# Patient Record
Sex: Male | Born: 1970 | Race: Black or African American | Hispanic: No | Marital: Married | Smoking: Never smoker
Health system: Southern US, Community
[De-identification: ages and names within clinical notes are randomized; demographics above are authoritative.]

## PROBLEM LIST (undated history)

## (undated) DIAGNOSIS — Z8489 Family history of other specified conditions: Secondary | ICD-10-CM

## (undated) DIAGNOSIS — IMO0002 Reserved for concepts with insufficient information to code with codable children: Secondary | ICD-10-CM

## (undated) DIAGNOSIS — I5042 Chronic combined systolic (congestive) and diastolic (congestive) heart failure: Secondary | ICD-10-CM

## (undated) DIAGNOSIS — I10 Essential (primary) hypertension: Secondary | ICD-10-CM

## (undated) DIAGNOSIS — I428 Other cardiomyopathies: Secondary | ICD-10-CM

## (undated) DIAGNOSIS — I213 ST elevation (STEMI) myocardial infarction of unspecified site: Secondary | ICD-10-CM

## (undated) DIAGNOSIS — Z95810 Presence of automatic (implantable) cardiac defibrillator: Secondary | ICD-10-CM

## (undated) DIAGNOSIS — K219 Gastro-esophageal reflux disease without esophagitis: Secondary | ICD-10-CM

## (undated) DIAGNOSIS — D62 Acute posthemorrhagic anemia: Secondary | ICD-10-CM

## (undated) DIAGNOSIS — E119 Type 2 diabetes mellitus without complications: Secondary | ICD-10-CM

## (undated) HISTORY — DX: Chronic combined systolic (congestive) and diastolic (congestive) heart failure: I50.42

## (undated) HISTORY — DX: Other cardiomyopathies: I42.8

## (undated) HISTORY — DX: Essential (primary) hypertension: I10

## (undated) HISTORY — DX: ST elevation (STEMI) myocardial infarction of unspecified site: I21.3

## (undated) HISTORY — DX: Type 2 diabetes mellitus without complications: E11.9

## (undated) HISTORY — DX: Reserved for concepts with insufficient information to code with codable children: IMO0002

## (undated) HISTORY — PX: GASTRIC BYPASS: SHX52

---

## 2000-12-15 ENCOUNTER — Emergency Department (HOSPITAL_COMMUNITY): Admission: EM | Admit: 2000-12-15 | Discharge: 2000-12-15 | Payer: Self-pay | Admitting: Emergency Medicine

## 2007-03-01 ENCOUNTER — Emergency Department: Payer: Self-pay | Admitting: Emergency Medicine

## 2007-04-14 ENCOUNTER — Ambulatory Visit: Payer: Self-pay

## 2007-05-05 ENCOUNTER — Encounter: Payer: Self-pay | Admitting: Family Medicine

## 2007-05-21 ENCOUNTER — Ambulatory Visit: Payer: Self-pay | Admitting: Pain Medicine

## 2007-06-04 ENCOUNTER — Ambulatory Visit: Payer: Self-pay | Admitting: Family Medicine

## 2007-06-04 DIAGNOSIS — E119 Type 2 diabetes mellitus without complications: Secondary | ICD-10-CM | POA: Insufficient documentation

## 2007-06-04 DIAGNOSIS — Z794 Long term (current) use of insulin: Secondary | ICD-10-CM

## 2007-06-12 ENCOUNTER — Ambulatory Visit: Payer: Self-pay | Admitting: Family Medicine

## 2007-06-12 DIAGNOSIS — E7800 Pure hypercholesterolemia, unspecified: Secondary | ICD-10-CM

## 2007-06-12 DIAGNOSIS — E1169 Type 2 diabetes mellitus with other specified complication: Secondary | ICD-10-CM | POA: Insufficient documentation

## 2007-06-12 DIAGNOSIS — E785 Hyperlipidemia, unspecified: Secondary | ICD-10-CM | POA: Insufficient documentation

## 2007-06-13 LAB — CONVERTED CEMR LAB: C-Peptide: 1.17 ng/mL (ref 0.80–3.90)

## 2007-06-16 ENCOUNTER — Encounter: Payer: Self-pay | Admitting: Family Medicine

## 2007-06-17 LAB — CONVERTED CEMR LAB
ALT: 27 units/L (ref 0–53)
AST: 19 units/L (ref 0–37)
Albumin: 3.6 g/dL (ref 3.5–5.2)
Alkaline Phosphatase: 60 units/L (ref 39–117)
BUN: 17 mg/dL (ref 6–23)
Bilirubin, Direct: 0.1 mg/dL (ref 0.0–0.3)
CO2: 34 meq/L — ABNORMAL HIGH (ref 19–32)
Calcium: 9.3 mg/dL (ref 8.4–10.5)
Chloride: 104 meq/L (ref 96–112)
Cholesterol: 135 mg/dL (ref 0–200)
Creatinine, Ser: 0.9 mg/dL (ref 0.4–1.5)
Creatinine,U: 119.3 mg/dL
GFR calc Af Amer: 121 mL/min
GFR calc non Af Amer: 100 mL/min
Glucose, Bld: 228 mg/dL — ABNORMAL HIGH (ref 70–99)
HDL: 39.4 mg/dL (ref >39.0)
Hgb A1c MFr Bld: 7.8 % — ABNORMAL HIGH (ref 4.6–6.0)
LDL Cholesterol: 75 mg/dL (ref 0–99)
Microalb Creat Ratio: 95.6 mg/g — ABNORMAL HIGH (ref 0.0–30.0)
Microalb, Ur: 11.4 mg/dL — ABNORMAL HIGH (ref 0.0–1.9)
Potassium: 4.6 meq/L (ref 3.5–5.1)
Sodium: 142 meq/L (ref 135–145)
Total Bilirubin: 0.7 mg/dL (ref 0.3–1.2)
Total CHOL/HDL Ratio: 3.4
Total Protein: 7.6 g/dL (ref 6.0–8.3)
Triglycerides: 102 mg/dL (ref 0–149)
VLDL: 20 mg/dL (ref 0–40)

## 2007-06-20 ENCOUNTER — Ambulatory Visit: Payer: Self-pay | Admitting: Family Medicine

## 2007-06-23 ENCOUNTER — Encounter: Payer: Self-pay | Admitting: Family Medicine

## 2007-06-25 ENCOUNTER — Ambulatory Visit: Payer: Self-pay | Admitting: Family Medicine

## 2007-06-25 ENCOUNTER — Telehealth (INDEPENDENT_AMBULATORY_CARE_PROVIDER_SITE_OTHER): Payer: Self-pay | Admitting: *Deleted

## 2007-07-01 ENCOUNTER — Encounter: Payer: Self-pay | Admitting: Family Medicine

## 2007-07-02 ENCOUNTER — Ambulatory Visit: Payer: Self-pay | Admitting: Family Medicine

## 2007-07-03 ENCOUNTER — Ambulatory Visit: Payer: Self-pay | Admitting: Pain Medicine

## 2007-07-09 ENCOUNTER — Ambulatory Visit: Payer: Self-pay | Admitting: Pain Medicine

## 2007-07-09 ENCOUNTER — Ambulatory Visit: Payer: Self-pay | Admitting: Family Medicine

## 2007-07-09 DIAGNOSIS — I152 Hypertension secondary to endocrine disorders: Secondary | ICD-10-CM | POA: Insufficient documentation

## 2007-07-09 DIAGNOSIS — I10 Essential (primary) hypertension: Secondary | ICD-10-CM | POA: Insufficient documentation

## 2007-07-09 DIAGNOSIS — E1159 Type 2 diabetes mellitus with other circulatory complications: Secondary | ICD-10-CM | POA: Insufficient documentation

## 2007-07-18 ENCOUNTER — Ambulatory Visit: Payer: Self-pay | Admitting: Family Medicine

## 2007-07-21 ENCOUNTER — Encounter: Payer: Self-pay | Admitting: Family Medicine

## 2007-07-21 LAB — CONVERTED CEMR LAB: Creatinine, Ser: 1 mg/dL (ref 0.40–1.50)

## 2007-07-26 ENCOUNTER — Ambulatory Visit: Payer: Self-pay | Admitting: Family Medicine

## 2007-08-20 ENCOUNTER — Encounter: Payer: Self-pay | Admitting: Family Medicine

## 2007-08-27 ENCOUNTER — Telehealth: Payer: Self-pay | Admitting: Family Medicine

## 2007-09-04 ENCOUNTER — Encounter (INDEPENDENT_AMBULATORY_CARE_PROVIDER_SITE_OTHER): Payer: Self-pay | Admitting: *Deleted

## 2007-10-01 ENCOUNTER — Ambulatory Visit: Payer: Self-pay | Admitting: Family Medicine

## 2007-10-03 LAB — CONVERTED CEMR LAB: Hgb A1c MFr Bld: 8.9 % — ABNORMAL HIGH (ref 4.6–6.0)

## 2008-01-05 ENCOUNTER — Encounter (INDEPENDENT_AMBULATORY_CARE_PROVIDER_SITE_OTHER): Payer: Self-pay | Admitting: *Deleted

## 2010-07-25 ENCOUNTER — Ambulatory Visit: Payer: Self-pay | Admitting: Internal Medicine

## 2010-07-25 DIAGNOSIS — M722 Plantar fascial fibromatosis: Secondary | ICD-10-CM | POA: Insufficient documentation

## 2010-09-24 HISTORY — PX: FINGER SURGERY: SHX640

## 2010-09-29 ENCOUNTER — Encounter: Payer: Self-pay | Admitting: Family Medicine

## 2010-09-29 ENCOUNTER — Ambulatory Visit
Admission: RE | Admit: 2010-09-29 | Discharge: 2010-09-29 | Payer: Self-pay | Source: Home / Self Care | Attending: Family Medicine | Admitting: Family Medicine

## 2010-09-29 DIAGNOSIS — K219 Gastro-esophageal reflux disease without esophagitis: Secondary | ICD-10-CM | POA: Insufficient documentation

## 2010-09-29 DIAGNOSIS — N529 Male erectile dysfunction, unspecified: Secondary | ICD-10-CM | POA: Insufficient documentation

## 2010-10-02 ENCOUNTER — Ambulatory Visit
Admission: RE | Admit: 2010-10-02 | Discharge: 2010-10-02 | Payer: Self-pay | Source: Home / Self Care | Attending: Family Medicine | Admitting: Family Medicine

## 2010-10-02 ENCOUNTER — Other Ambulatory Visit: Payer: Self-pay | Admitting: Family Medicine

## 2010-10-02 LAB — BASIC METABOLIC PANEL
BUN: 18 mg/dL (ref 6–23)
CO2: 32 mEq/L (ref 19–32)
Calcium: 9.2 mg/dL (ref 8.4–10.5)
Chloride: 102 mEq/L (ref 96–112)
Creatinine, Ser: 0.9 mg/dL (ref 0.4–1.5)
GFR: 117.86 mL/min (ref >60.00)
Glucose, Bld: 222 mg/dL — ABNORMAL HIGH (ref 70–99)
Potassium: 4.7 mEq/L (ref 3.5–5.1)
Sodium: 139 mEq/L (ref 135–145)

## 2010-10-02 LAB — LIPID PANEL
Cholesterol: 103 mg/dL (ref 0–200)
HDL: 44.5 mg/dL (ref >39.00)
LDL Cholesterol: 52 mg/dL (ref 0–99)
Total CHOL/HDL Ratio: 2
Triglycerides: 34 mg/dL (ref 0.0–149.0)
VLDL: 6.8 mg/dL (ref 0.0–40.0)

## 2010-10-02 LAB — HEPATIC FUNCTION PANEL
ALT: 15 U/L (ref 0–53)
AST: 18 U/L (ref 0–37)
Albumin: 3.5 g/dL (ref 3.5–5.2)
Alkaline Phosphatase: 62 U/L (ref 39–117)
Bilirubin, Direct: 0.1 mg/dL (ref 0.0–0.3)
Total Bilirubin: 1 mg/dL (ref 0.3–1.2)
Total Protein: 7.8 g/dL (ref 6.0–8.3)

## 2010-10-02 LAB — HEMOGLOBIN A1C: Hgb A1c MFr Bld: 9.3 % — ABNORMAL HIGH (ref 4.6–6.5)

## 2010-10-15 ENCOUNTER — Encounter: Payer: Self-pay | Admitting: Orthopaedic Surgery

## 2010-10-20 ENCOUNTER — Ambulatory Visit: Admit: 2010-10-20 | Payer: Self-pay | Admitting: Family Medicine

## 2010-10-23 ENCOUNTER — Ambulatory Visit: Admission: RE | Admit: 2010-10-23 | Payer: Self-pay | Source: Home / Self Care | Admitting: Family Medicine

## 2010-10-26 NOTE — Assessment & Plan Note (Signed)
Summary: ROA FOR 1-2 MONTH FOLLOW-UP/JRR   Vital Signs:  Patient profile:   40 year old male Height:      71.25 inches Weight:      288.50 pounds BMI:     40.10 Temp:     98.6 degrees F oral Pulse rate:   80 / minute Pulse rhythm:   regular BP sitting:   140 / 94  (left arm) Cuff size:   large  Vitals Entered By: Benny Lennert CMA Duncan Dull) (September 29, 2010 3:05 PM)  History of Present Illness: Chief complaint 1-2 month follow up DM   I have not seen this gentleman with HTN, High cholesterol and DM since 2009.Getting more exercsie at work. Trying to eat healthier.   Has lost 16 lbs since 2009.    DM, unclear control.. likely poor Has a meter but does not have strips to check blood sugar. Financial constraints.   Using insulin Humulin 70/30 from [facility] without a prescription.  Using 45 Units two times a day    HTN, poor control: Has been off lisinopril for quite sometime.  Having issues with ED.   High cholesterol: unclear control  Plantar fasciitis: Treated with NSAIDs, stretching, help pad...Marland Kitchenper pt   Dental decay 2 teeth..working on appt wth dentist... two back molars need to be taken out.  Hypertension History:      poor control. Marland Kitchen        Positive major cardiovascular risk factors include diabetes, hyperlipidemia, and hypertension.  Negative major cardiovascular risk factors include male age less than 20 years old and non-tobacco-user status.      Problems Prior to Update: 1)  Erectile Dysfunction, Organic  (ICD-607.84) 2)  Gerd  (ICD-530.81) 3)  Chest Pain, Atypical  (ICD-786.59) 4)  Plantar Fasciitis, Right  (ICD-728.71) 5)  Hypertension  (ICD-401.9) 6)  Hypercholesterolemia, Pure  (ICD-272.0) 7)  Diabetes Mellitus, Type I  (ICD-250.01) 8)  Diabetes Mellitus, Type II  (ICD-250.00)  Current Medications (verified): 1)  Humulin 70/30 70-30 %  Susp (Insulin Isophane & Regular) .... Nph/reg 45 Units in 7 Am and 45 Units At 7pm 2)  One Touch Ultra Mini Test  Strips .... Use To Test Daily To  Tid  As Directed  Dx 250.01 3)  One Touch Ultra Mini Lancets .... Use To Test Qid As Directed 4)  Accusure Insulin Syringe 31g X 5/16" 1 Ml  Misc (Insulin Syringe-Needle U-100) .... As Directed 5)  Lisinopril-Hydrochlorothiazide 20-12.5 Mg Tabs (Lisinopril-Hydrochlorothiazide) .Marland Kitchen.. 1 Tab By Mouth Daily  Allergies (verified): No Known Drug Allergies  Review of Systems General:  Denies fatigue and fever. CV:  Complains of chest pain or discomfort; non exertional chest pain, intermittant iover 6 months, no associated symptoms. Lasts 20 min at a time.. usually when lying down at night. Occuring once a week.Marland Kitchen Resp:  Denies shortness of breath. GI:  Complains of indigestion; denies abdominal pain, bloody stools, constipation, and diarrhea. GU:  Denies dysuria.  Physical Exam  General:  morbidly obese Ears:  External ear exam shows no significant lesions or deformities.  Otoscopic examination reveals clear canals, tympanic membranes are intact bilaterally without bulging, retraction, inflammation or discharge. Hearing is grossly normal bilaterally. Nose:  External nasal examination shows no deformity or inflammation. Nasal mucosa are pink and moist without lesions or exudates. Mouth:  Oral mucosa and oropharynx without lesions or exudates.  Teeth in good repair. Neck:  no carotid bruit or thyromegaly no cervical or supraclavicular lymphadenopathy  Lungs:  Normal respiratory effort,  chest expands symmetrically. Lungs are clear to auscultation, no crackles or wheezes. Heart:  Normal rate and regular rhythm. S1 and S2 normal without gallop, murmur, click, rub or other extra sounds. Abdomen:  Bowel sounds positive,abdomen soft and non-tender without masses, organomegaly or hernias noted. Pulses:  2+ rad /DP/PT pulses Extremities:  no pedal edema.    Diabetes Management Exam:    Foot Exam (with socks and/or shoes not present):       Sensory-Pinprick/Light  touch:          Left medial foot (L-4): normal          Left dorsal foot (L-5): normal          Left lateral foot (S-1): normal          Right medial foot (L-4): normal          Right dorsal foot (L-5): normal          Right lateral foot (S-1): normal       Sensory-Monofilament:          Left foot: normal          Right foot: normal       Inspection:          Left foot: normal          Right foot: normal       Nails:          Left foot: normal          Right foot: normal   Impression & Recommendations:  Problem # 1:  CHEST PAIN, ATYPICAL (ICD-786.59) High risk for cardiac issues. Orders: EKG w/ Interpretation (93000): NML sinus , no ST changes, no Q waves     Most likely due to GERD. Recommend weight loss, diet changes. Consider prilosec if not improving.   Problem # 2:  HYPERTENSION (ICD-401.9) Poor control. Encouraged exercise, weight loss, healthy eating habits.  Restart medicaiton.  His updated medication list for this problem includes:    Lisinopril-hydrochlorothiazide 20-12.5 Mg Tabs (Lisinopril-hydrochlorothiazide) .Marland Kitchen... 1 tab by mouth daily  Problem # 3:  DIABETES MELLITUS, TYPE II (ICD-250.00) Continue lisinopril. Start check ing fasting blood sugar daily. Check A1C.  His updated medication list for this problem includes:    Humulin 70/30 70-30 % Susp (Insulin isophane & regular) ..... Nph/reg 45 units in 7 am and 45 units at 7pm    Lisinopril-hydrochlorothiazide 20-12.5 Mg Tabs (Lisinopril-hydrochlorothiazide) .Marland Kitchen... 1 tab by mouth daily  Problem # 4:  HYPERCHOLESTEROLEMIA, PURE (ICD-272.0) Due for reeval.   Complete Medication List: 1)  Humulin 70/30 70-30 % Susp (Insulin isophane & regular) .... Nph/reg 45 units in 7 am and 45 units at 7pm 2)  One Touch Ultra Mini Test Strips  .... Use to test daily to  tid  as directed  dx 250.01 3)  One Touch Ultra Mini Lancets  .... Use to test qid as directed 4)  Accusure Insulin Syringe 31g X 5/16" 1 Ml Misc (Insulin  syringe-needle u-100) .... As directed 5)  Lisinopril-hydrochlorothiazide 20-12.5 Mg Tabs (Lisinopril-hydrochlorothiazide) .Marland Kitchen.. 1 tab by mouth daily  Hypertension Assessment/Plan:      The patient's hypertensive risk group is category C: Target organ damage and/or diabetes.  His calculated 10 year risk of coronary heart disease is 7 %.  Today's blood pressure is 140/94.  His blood pressure goal is < 130/80.  Patient Instructions: 1)  Check fasting blood sugar each morning.. Right down measurements. 2)  Start lisinopril back.  3)  Fasting A1C, lipids, CMET Dx 250.00.. call the day before to schedule ASAP 4)  Schedule CPX in  3 months.  5)  Prior to CPX return for A1C Dx 250.00 6)  See your eye doctor yearly to check for diabetic eye damage. Prescriptions: HUMULIN 70/30 70-30 %  SUSP (INSULIN ISOPHANE & REGULAR) NPH/REG 45 units in 7 AM and 45 units at 7pm  #3 bottles x 2   Entered and Authorized by:   Kerby Nora MD   Signed by:   Kerby Nora MD on 09/29/2010   Method used:   Electronically to        [facility] [address] (retail)       [address]       [place], [place]  [address]       Ph: [phone]       Fax: [phone]   RxID:   [id number] LISINOPRIL-HYDROCHLOROTHIAZIDE 20-12.5 MG TABS (LISINOPRIL-HYDROCHLOROTHIAZIDE) 1 tab by mouth daily  #30 x 11   Entered and Authorized by:   Kerby Nora MD   Signed by:   Kerby Nora MD on 09/29/2010   Method used:   Electronically to        [facility] [address] (retail)       [address]       [place], [place]  [address]       Ph: [phone]       Fax: [phone]   RxID:   [id number] ONE TOUCH ULTRA MINI TEST STRIPS use to test daily to  TID  as directed  Dx 250.01  #1 box x 3   Entered and Authorized by:   Kerby Nora MD   Signed by:   Kerby Nora MD on 09/29/2010   Method used:   Print then Give to Patient   RxID:    [id number]    Orders Added: 1)  EKG w/ Interpretation [93000] 2)  Est. Patient Level IV [96222]    Current Allergies (reviewed today): No known allergies   Prevention & Chronic Care Immunizations   Influenza vaccine: Not documented    Tetanus booster: Not documented    Pneumococcal vaccine: Not documented  Other Screening   Smoking status: never  (06/04/2007)  Diabetes Mellitus   HgbA1C: 8.9  (10/01/2007)    Eye exam: Not documented    Foot exam: yes  (09/29/2010)   High risk foot: Not documented   Foot care education: Not documented    Urine microalbumin/creatinine ratio: 95.6  (06/12/2007)  Lipids   Total Cholesterol: 135  (06/12/2007)   LDL: 75  (06/12/2007)   LDL Direct: Not documented   HDL: 39.4  (06/12/2007)   Triglycerides: 102  (06/12/2007)    SGOT (AST): 19  (06/12/2007)   SGPT (ALT): 27  (06/12/2007)   Alkaline phosphatase: 60  (06/12/2007)   Total bilirubin: 0.7  (06/12/2007)  Hypertension   Last Blood Pressure: 140 / 94  (09/29/2010)   Serum creatinine: 1.00  (07/18/2007)   Serum potassium 4.6  (06/12/2007)  Self-Management Support :    Diabetes self-management support: Not documented    Hypertension self-management support: Not documented    Lipid self-management support: Not documented

## 2010-10-26 NOTE — Assessment & Plan Note (Signed)
Summary: FOOT PAIN,DIABETIC/CLE   Vital Signs:  Patient profile:   40 year old male Height:      71.25 inches Weight:      286.25 pounds BMI:     39.79 Temp:     98.7 degrees F oral Pulse rate:   80 / minute Pulse rhythm:   regular BP sitting:   124 / 100  (left arm) Cuff size:   large  Vitals Entered By: Selena Batten Dance CMA (AAMA) (July 25, 2010 4:21 PM) CC: Right foot pain   History of Present Illness: CC: R foot pain  3 wk h/o heel of R foot painful, nagging, tingling.  when puts pressure on heel hurts.  Started since injury  at work  ~3wks ago, in [place] stepping off trailer to set vault, stepped off trailer rise and fell on heel.  No pain then.  Next day significant pain at heel.  No bruising.  Painful ever since.  Using pain rub (topical NSAID?) on which helps.  Much better now.  Worst pain is first step in morning.  Then slowly improves throughout day.  Certain shoes (with poor arch support) make pain worse.    Diabetic.    Current Medications (verified): 1)  Humulin 70/30 70-30 %  Susp (Insulin Isophane & Regular) .... Nph/reg 60 Units in 7 Am and 42 Units At 7pm 2)  One Touch Ultra Mini Test Strips .... Use To Test Qid As Directed 3)  One Touch Ultra Mini Lancets .... Use To Test Qid As Directed 4)  Accusure Insulin Syringe 31g X 5/16" 1 Ml  Misc (Insulin Syringe-Needle U-100) .... As Directed  Allergies (verified): No Known Drug Allergies  Past History:  Past Medical History: Diabetes mellitus, type I (40yo at dx) Hypertension  Social History: Occupation: makes Airline pilot, Location manager Married x 1 years Never Smoked Alcohol use-no Drug use-no Regular exercise-yes, walking 1 mile per day Has lost 20 bs Diet: fast food, diet soda, unsweet tea  Review of Systems       per HPI  Physical Exam  General:  morbidly obese Msk:  R heel with tenderness on palpation.  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  + knees with valgus from flat  foot. Pulses:  2+ rad /DP/PT pulses Extremities:  no pedal edema.   Skin:  Intact without suspicious lesions or rashes   Impression & Recommendations:  Problem # 1:  PLANTAR FASCIITIS, RIGHT (ICD-728.71) history and exam consistent with plantar fasciitis.  treat conservatively with NSAIDs and ice/rest and heel cushion.  RTC if not better with these measures.  Complete Medication List: 1)  Humulin 70/30 70-30 % Susp (Insulin isophane & regular) .... Nph/reg 60 units in 7 am and 42 units at 7pm 2)  One Touch Ultra Mini Test Strips  .... Use to test qid as directed 3)  One Touch Ultra Mini Lancets  .... Use to test qid as directed 4)  Accusure Insulin Syringe 31g X 5/16" 1 Ml Misc (Insulin syringe-needle u-100) .... As directed  Patient Instructions: 1)  Make follow up appointment for diabetes with Dr. Ermalene Searing in next 1-2 months. 2)  Sounds like plantar fasciitis. 3)  Treat with anti inflammatory (advil 200mg  1-2 pills every 6-8 hours). 4)  Use heel cushion in R shoe 5)  Frozen Water bottle massage 6)  If not improving, give [place] a call. 7)  If worsening, you may need to be seen again.   Orders Added: 1)  Est. Patient Level  III [id number]    Current Allergies (reviewed today): No known allergies

## 2010-10-30 ENCOUNTER — Ambulatory Visit: Payer: Self-pay | Admitting: Family Medicine

## 2010-11-06 ENCOUNTER — Ambulatory Visit: Payer: Self-pay | Admitting: Family Medicine

## 2010-11-07 ENCOUNTER — Encounter: Payer: Self-pay | Admitting: Family Medicine

## 2010-11-07 ENCOUNTER — Ambulatory Visit (INDEPENDENT_AMBULATORY_CARE_PROVIDER_SITE_OTHER): Payer: 59 | Admitting: Family Medicine

## 2010-11-07 DIAGNOSIS — I10 Essential (primary) hypertension: Secondary | ICD-10-CM

## 2010-11-07 DIAGNOSIS — E7800 Pure hypercholesterolemia, unspecified: Secondary | ICD-10-CM

## 2010-11-07 DIAGNOSIS — E119 Type 2 diabetes mellitus without complications: Secondary | ICD-10-CM

## 2010-11-15 NOTE — Assessment & Plan Note (Signed)
Summary: follow up   Vital Signs:  Patient profile:   40 year old male Height:      71.25 inches Weight:      282.50 pounds BMI:     39.27 Temp:     98.9 degrees F oral Pulse rate:   80 / minute Pulse rhythm:   regular BP sitting:   160 / 90  (left arm) Cuff size:   large  Vitals Entered By: Benny Lennert CMA Duncan Dull) (November 07, 2010 4:16 PM)  History of Present Illness: Chief complaint follow up   High Cholesterol.. well controlled on recent labs. On no meidcaiton.   DM, poor control on 70/30 45 UNits two times a day  FBS per pt 198-349.. usually 260s  6 lb weight loss per pt  Diet: Fair control.. trying to decrease bread.  Yesterday ate at [employer] (2 buiscuit s and egg) and [employer]  Exercise: at work taking metformin wth no SE.   HTN, poor control desptite being back on lisinopril/HCTZ.   Does not have cuff at home.   Problems Prior to Update: 1)  Erectile Dysfunction, Organic  (ICD-607.84) 2)  Gerd  (ICD-530.81) 3)  Chest Pain, Atypical  (ICD-786.59) 4)  Plantar Fasciitis, Right  (ICD-728.71) 5)  Hypertension  (ICD-401.9) 6)  Hypercholesterolemia, Pure  (ICD-272.0) 7)  Diabetes Mellitus, Type I  (ICD-250.01) 8)  Diabetes Mellitus, Type II  (ICD-250.00)  Current Medications (verified): 1)  Humulin 70/30 70-30 %  Susp (Insulin Isophane & Regular) .... Nph/reg 45 Units in 7 Am and 45 Units At 7pm 2)  One Touch Ultra Mini Test Strips .... Use To Test Daily To  Tid  As Directed  Dx 250.01 3)  One Touch Ultra Mini Lancets .... Use To Test Qid As Directed 4)  Accusure Insulin Syringe 31g X 5/16" 1 Ml  Misc (Insulin Syringe-Needle U-100) .... As Directed 5)  Lisinopril-Hydrochlorothiazide 20-12.5 Mg Tabs (Lisinopril-Hydrochlorothiazide) .Marland Kitchen.. 1 Tab By Mouth Daily 6)  Metformin Hcl 500 Mg Xr24h-Tab (Metformin Hcl) .Marland Kitchen.. 1 Tab By Mouth Daily  Allergies (verified): No Known Drug Allergies  Past History:  Past medical, surgical, family and social histories  (including risk factors) reviewed, and no changes noted (except as noted below).  Past Medical History: Reviewed history from 07/25/2010 and no changes required. Diabetes mellitus, type I (40yo at dx) Hypertension  Past Surgical History: Reviewed history from 06/04/2007 and no changes required. slipped discs L4L5 steroid epidural injections Denies surgical history  Family History: Reviewed history from 06/04/2007 and no changes required. father died age 48 DM, HTN, colon cancer mother age 51 DM, HTN, breast cancer 3 brothers healthy MGF prostate cancer, CVA no MI , age 6  Social History: Reviewed history from 07/25/2010 and no changes required. Occupation: makes Airline pilot, Location manager Married x 1 years Never Smoked Alcohol use-no Drug use-no Regular exercise-yes, walking 1 mile per day Has lost 20 bs Diet: fast food, diet soda, unsweet tea  Physical Exam  General:  overweight male in NAD Ears:  External ear exam shows no significant lesions or deformities.  Otoscopic examination reveals clear canals, tympanic membranes are intact bilaterally without bulging, retraction, inflammation or discharge. Hearing is grossly normal bilaterally. Nose:  External nasal examination shows no deformity or inflammation. Nasal mucosa are pink and moist without lesions or exudates. Mouth:  MMM Neck:  no carotid bruit or thyromegaly no cervical or supraclavicular lymphadenopathy  Lungs:  Normal respiratory effort, chest expands symmetrically. Lungs are clear to auscultation, no  crackles or wheezes. Heart:  Normal rate and regular rhythm. S1 and S2 normal without gallop, murmur, click, rub or other extra sounds. Abdomen:  Bowel sounds positive,abdomen soft and non-tender without masses, organomegaly or hernias noted. Pulses:  R and L posterior tibial pulses are full and equal bilaterally  Extremities:  no edema   Diabetes Management Exam:    Foot Exam (with socks and/or shoes  not present):       Sensory-Pinprick/Light touch:          Left medial foot (L-4): normal          Left dorsal foot (L-5): normal          Left lateral foot (S-1): normal          Right medial foot (L-4): normal          Right dorsal foot (L-5): normal          Right lateral foot (S-1): normal       Sensory-Monofilament:          Left foot: normal          Right foot: normal       Inspection:          Left foot: normal          Right foot: normal       Nails:          Left foot: normal          Right foot: normal   Impression & Recommendations:  Problem # 1:  HYPERTENSION (ICD-401.9) Inadequate control.. increase to 2 tab by mouth daily.  His updated medication list for this problem includes:    Lisinopril-hydrochlorothiazide 20-12.5 Mg Tabs (Lisinopril-hydrochlorothiazide) .Marland Kitchen... 2 tab by mouth daily  Problem # 2:  HYPERCHOLESTEROLEMIA, PURE (ICD-272.0) Well controlled . Recheck in 1 year.   Problem # 3:  DIABETES MELLITUS, TYPE II (ICD-250.00) Continue 70/30 . Increase metformin gradually to max 2000 mg daily. His updated medication list for this problem includes:    Humulin 70/30 70-30 % Susp (Insulin isophane & regular) ..... Nph/reg 45 units in 7 am and 45 units at 7pm    Lisinopril-hydrochlorothiazide 20-12.5 Mg Tabs (Lisinopril-hydrochlorothiazide) .Marland Kitchen... 2 tab by mouth daily    Metformin Hcl 1000 Mg (osm) Xr24h-tab (Metformin hcl) .Marland Kitchen... 1 tab by mouth daily... if tolerated increas to 2 tabs by mouth daily.  Complete Medication List: 1)  Humulin 70/30 70-30 % Susp (Insulin isophane & regular) .... Nph/reg 45 units in 7 am and 45 units at 7pm 2)  One Touch Ultra Mini Test Strips  .... Use to test daily to  tid  as directed  dx 250.01 3)  One Touch Ultra Mini Lancets  .... Use to test qid as directed 4)  Accusure Insulin Syringe 31g X 5/16" 1 Ml Misc (Insulin syringe-needle u-100) .... As directed 5)  Lisinopril-hydrochlorothiazide 20-12.5 Mg Tabs  (Lisinopril-hydrochlorothiazide) .... 2 tab by mouth daily 6)  Metformin Hcl 1000 Mg (osm) Xr24h-tab (Metformin hcl) .Marland Kitchen.. 1 tab by mouth daily... if tolerated increas to 2 tabs by mouth daily.  Patient Instructions: 1)  Work on decreasing carbs starches in diet... increaselean protein, veggies, and fiber. 2)   Increase exercsie as able. 3)  Increase to 2 tabs daily of metformin x 1 week... if tolertating increase to 3 tabs daily x 1 week... then  4 tabs daily. 4)  When out of 500 mg tab meds.Marland Kitchen go to pharmacy or new refill. 5)  Increase lisinopril to 2 tabs by mouth daily.  6)   Please schedule a follow-up appointment in 3 months .  7)  HgBA1c prior to visit  ICD-9: 250.00 Prescriptions: METFORMIN HCL 1000 MG (OSM) XR24H-TAB (METFORMIN HCL) 1 tab by mouth daily... if tolerated increas to 2 tabs by mouth daily.  #60 x 11   Entered and Authorized by:   Kerby Nora MD   Signed by:   Kerby Nora MD on 11/07/2010   Method used:   Electronically to        [facility] [address] (retail)       [address]       [place], [place]  [address]       Ph: [phone]       Fax: [phone]   RxID:   [id number]    Orders Added: 1)  Est. Patient Level IV [36644]    Current Allergies (reviewed today): No known allergies

## 2010-11-18 ENCOUNTER — Encounter: Payer: Self-pay | Admitting: Family Medicine

## 2010-12-15 ENCOUNTER — Other Ambulatory Visit: Payer: Self-pay | Admitting: *Deleted

## 2010-12-15 MED ORDER — METFORMIN HCL ER (MOD) 1000 MG PO TB24: ORAL_TABLET | ORAL | Status: DC

## 2010-12-15 MED ORDER — INSULIN NPH ISOPHANE & REGULAR (70-30) 100 UNIT/ML ~~LOC~~ SUSP: SUBCUTANEOUS | Status: DC

## 2010-12-15 MED ORDER — LISINOPRIL-HYDROCHLOROTHIAZIDE 20-12.5 MG PO TABS: 2.0000 | ORAL_TABLET | Freq: Every day | ORAL | Status: DC

## 2010-12-21 ENCOUNTER — Telehealth: Payer: Self-pay | Admitting: *Deleted

## 2010-12-21 NOTE — Telephone Encounter (Signed)
Pharmacy is asking for clarification on quantity on novolin 70/30.  Forms are on your desk.

## 2010-12-28 ENCOUNTER — Other Ambulatory Visit: Payer: Self-pay

## 2011-01-01 ENCOUNTER — Encounter: Payer: Self-pay | Admitting: Family Medicine

## 2011-02-05 ENCOUNTER — Other Ambulatory Visit (INDEPENDENT_AMBULATORY_CARE_PROVIDER_SITE_OTHER): Payer: 59

## 2011-02-05 DIAGNOSIS — E119 Type 2 diabetes mellitus without complications: Secondary | ICD-10-CM

## 2011-02-05 LAB — HEMOGLOBIN A1C: Hgb A1c MFr Bld: 8.8 % — ABNORMAL HIGH (ref 4.6–6.5)

## 2011-02-05 NOTE — Progress Notes (Signed)
Addended by: Melody Comas on: 02/05/2011 08:25 AM   Modules accepted: Orders

## 2011-02-09 ENCOUNTER — Encounter: Payer: Self-pay | Admitting: Family Medicine

## 2011-02-27 ENCOUNTER — Ambulatory Visit (INDEPENDENT_AMBULATORY_CARE_PROVIDER_SITE_OTHER): Payer: 59 | Admitting: Family Medicine

## 2011-02-27 ENCOUNTER — Encounter: Payer: Self-pay | Admitting: Family Medicine

## 2011-02-27 DIAGNOSIS — E7800 Pure hypercholesterolemia, unspecified: Secondary | ICD-10-CM

## 2011-02-27 DIAGNOSIS — M7580 Other shoulder lesions, unspecified shoulder: Secondary | ICD-10-CM

## 2011-02-27 DIAGNOSIS — I10 Essential (primary) hypertension: Secondary | ICD-10-CM

## 2011-02-27 DIAGNOSIS — M719 Bursopathy, unspecified: Secondary | ICD-10-CM

## 2011-02-27 DIAGNOSIS — Z23 Encounter for immunization: Secondary | ICD-10-CM

## 2011-02-27 DIAGNOSIS — Z0000 Encounter for general adult medical examination without abnormal findings: Secondary | ICD-10-CM

## 2011-02-27 DIAGNOSIS — E119 Type 2 diabetes mellitus without complications: Secondary | ICD-10-CM

## 2011-02-27 DIAGNOSIS — M67919 Unspecified disorder of synovium and tendon, unspecified shoulder: Secondary | ICD-10-CM

## 2011-02-27 MED ORDER — DICLOFENAC SODIUM 75 MG PO TBEC: 75.0000 mg | DELAYED_RELEASE_TABLET | Freq: Two times a day (BID) | ORAL | Status: DC

## 2011-02-27 NOTE — Progress Notes (Signed)
Subjective:    Patient ID: Philip Ponce, male    DOB: [birth year], 40 y.o.   MRN: [id number]  HPI The patient is here for annual wellness exam and preventative care.     Diabetes: On 50 Units 70/30 in AM and 45 in PM and on metformin. He feels better splitting dose AM and PM.  Has lost 4 lbs. Using medications without difficulties: Yes. Hypoglycemic episodes:Yes Hyperglycemic episodes:No Feet problems:None Blood Sugars averaging: not checking regualrly 170-200s eye exam within last year: none  Hypertension:  At goal < 130/80 on lisinopril HCTZ.   Using medication without problems or lightheadedness:  Chest pain with exertion: None Edema:None Short of breath:None Average home BPs:?   Elevated Cholesterol:Was well controlled at last check 3 months ago.  Right shoulder pain x months. Pain with abduction, int and ecxt rotation.  No fall.  Cannot sleep due to pain. No weakness, no numbness.    Review of Systems  Constitutional: Negative for fever, fatigue and unexpected weight change.  HENT: Negative for ear pain, congestion, sore throat, rhinorrhea, trouble swallowing and postnasal drip.   Eyes: Negative for pain.  Respiratory: Negative for cough, shortness of breath and wheezing.   Cardiovascular: Negative for chest pain, palpitations and leg swelling.  Gastrointestinal: Negative for nausea, abdominal pain, diarrhea, constipation and blood in stool.  Genitourinary: Negative for dysuria, urgency, hematuria, discharge, penile swelling, scrotal swelling, difficulty urinating, penile pain and testicular pain.  Skin: Negative for rash.  Neurological: Negative for syncope, weakness, light-headedness, numbness and headaches.  Psychiatric/Behavioral: Negative for behavioral problems and dysphoric mood. The patient is not nervous/anxious.        Objective:   Physical Exam  Constitutional: He appears well-developed and well-nourished.  Non-toxic appearance. He does not appear  ill. No distress.  HENT:  Head: Normocephalic and atraumatic.  Right Ear: Hearing, tympanic membrane, external ear and ear canal normal.  Left Ear: Hearing, tympanic membrane, external ear and ear canal normal.  Nose: Nose normal.  Mouth/Throat: Uvula is midline, oropharynx is clear and moist and mucous membranes are normal.  Eyes: Conjunctivae, EOM and lids are normal. Pupils are equal, round, and reactive to light. No foreign bodies found.  Neck: Trachea normal, normal range of motion and phonation normal. Neck supple. Carotid bruit is not present. No mass and no thyromegaly present.  Cardiovascular: Normal rate, regular rhythm, S1 normal, S2 normal, intact distal pulses and normal pulses.  Exam reveals no gallop.   No murmur heard. Pulmonary/Chest: Breath sounds normal. He has no wheezes. He has no rhonchi. He has no rales.  Abdominal: Soft. Normal appearance and bowel sounds are normal. There is no hepatosplenomegaly. There is no tenderness. There is no rebound, no guarding and no CVA tenderness. No hernia. Hernia confirmed negative in the right inguinal area and confirmed negative in the left inguinal area.  Genitourinary: Testes normal and penis normal. Right testis shows no mass and no tenderness. Left testis shows no mass and no tenderness. No paraphimosis or penile tenderness.  Musculoskeletal:       Right shoulder: He exhibits tenderness. He exhibits normal range of motion and no swelling.       Positive impingement , empty can on right  Neg drop arm test  Lymphadenopathy:    He has no cervical adenopathy.       Right: No inguinal adenopathy present.       Left: No inguinal adenopathy present.  Neurological: He is alert. He has normal strength and normal  reflexes. No cranial nerve deficit or sensory deficit. Gait normal.  Skin: Skin is warm, dry and intact. No rash noted.  Psychiatric: He has a normal mood and affect. His speech is normal and behavior is normal. Judgment normal.       Diabetic foot exam: Normal inspection No skin breakdown No calluses  Normal DP pulses Normal sensation to light touch and monofilament Nails normal     Assessment & Plan:  Complete Physical Exam: The patient's preventative maintenance and recommended screening tests for an annual wellness exam were reviewed in full today. Brought up to date unless services declined.  Counselled on the importance of diet, exercise, and its role in overall health and mortality. The patient's FH and SH was reviewed, including their home life, tobacco status, and drug and alcohol status.

## 2011-02-27 NOTE — Assessment & Plan Note (Signed)
NSAID diclofenac, stretching exercises given. Limit lifting >10 lbs. If not improving in 2 weeks call for steroid injection.

## 2011-02-27 NOTE — Patient Instructions (Addendum)
Increase 70/30 to 55 Units in AM and 50 Units at night. Continue the metformin. Continue work on regular exercise and healthy eating. Look into Victoza if covered by insurance. Get eye exam as soon as able. Start diclofenac twice a day.  Start regular stretching exercises.  Call in 2 weeks if pain not resolved for steroid injection.

## 2011-02-27 NOTE — Assessment & Plan Note (Signed)
Continued poor control, working on weight loss. Encouraged adding exercise. Increase insulin to 55 and 50 units 70/30. On max metformin. Pt will look into victoza as an opiton for him.

## 2011-02-27 NOTE — Assessment & Plan Note (Signed)
Well controlled on lisinopril HCTZ

## 2011-03-01 ENCOUNTER — Telehealth: Payer: Self-pay | Admitting: *Deleted

## 2011-03-01 NOTE — Telephone Encounter (Signed)
Agree with that POC

## 2011-03-01 NOTE — Telephone Encounter (Signed)
Pt states he was given a Td on 6/5 and it is now swollen, hurting.  Not red or hot.  Advised ice, otc pain medicine.  Advised to call back tomorrow if not improved.

## 2011-03-19 ENCOUNTER — Telehealth: Payer: Self-pay | Admitting: *Deleted

## 2011-03-19 NOTE — Telephone Encounter (Signed)
Pt was seen a couple of weeks ago for arm pain and was told to call if not better.  He says he is not better and he thinks the pain is worse, states the diclofenac isnt helping.  Please advise what to do next.

## 2011-03-20 NOTE — Telephone Encounter (Signed)
Please have him make an appt for shoulder eval and possible steroid injection with Dr. Patsy Lager

## 2011-03-20 NOTE — Telephone Encounter (Signed)
Spoke with patient and he will call this after noon to schedule appt after he checks with his work

## 2011-03-21 ENCOUNTER — Encounter: Payer: Self-pay | Admitting: Family Medicine

## 2011-03-21 ENCOUNTER — Ambulatory Visit (INDEPENDENT_AMBULATORY_CARE_PROVIDER_SITE_OTHER): Payer: 59 | Admitting: Family Medicine

## 2011-03-21 VITALS — BP 130/80 | HR 100 | Temp 99.1°F | Ht 72.0 in | Wt 285.4 lb

## 2011-03-21 DIAGNOSIS — M7501 Adhesive capsulitis of right shoulder: Secondary | ICD-10-CM | POA: Insufficient documentation

## 2011-03-21 DIAGNOSIS — M7580 Other shoulder lesions, unspecified shoulder: Secondary | ICD-10-CM

## 2011-03-21 DIAGNOSIS — M7500 Adhesive capsulitis of unspecified shoulder: Secondary | ICD-10-CM

## 2011-03-21 DIAGNOSIS — M67919 Unspecified disorder of synovium and tendon, unspecified shoulder: Secondary | ICD-10-CM

## 2011-03-21 DIAGNOSIS — M719 Bursopathy, unspecified: Secondary | ICD-10-CM

## 2011-03-21 NOTE — Patient Instructions (Signed)
Recheck 6 weeks 

## 2011-03-24 NOTE — Progress Notes (Signed)
Philip Ponce, a 40 y.o. male presents today for consultation regarding R shoulder pain, requested by Dr. Ermalene Searing:  The patient noted above presents with shoulder pain that has been ongoing for approximately the last 3-4 months. there is no history of trauma or accident recently. He has no significant history of trauma in the past to the affected shoulder. The patient denies neck pain or radicular symptoms. Denies dislocation, subluxation, separation of the shoulder. The patient does complain of pain in the overhead plane with significant painful arc of motion, he has pain with sleeping, and he has restriction in his motion.  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  REVIEW OF SYSTEMS  GEN: No fevers, chills. Nontoxic. Primarily MSK c/o today. MSK: Detailed in the HPI GI: tolerating PO intake without difficulty Neuro: No numbness, parasthesias, or tingling associated. Otherwise the pertinent positives of the ROS are noted above.   PHYSICAL EXAM  Blood pressure 130/80, pulse 100, temperature 99.1 F (37.3 C), temperature source Oral, height 6' (1.829 m), weight 285 lb 6.4 oz (129.457 kg), SpO2 98.00%.  GEN: Well-developed,well-nourished,in no acute distress; alert,appropriate and cooperative throughout examination HEENT: Normocephalic and atraumatic without obvious abnormalities. Ears, externally no deformities PULM: Breathing comfortably in no respiratory distress EXT: No clubbing, cyanosis, or edema PSYCH: Normally interactive. Cooperative during the interview. Pleasant. Friendly and conversant. Not anxious or depressed appearing.  Normal, full affect.  Shoulder: R Inspection: No muscle wasting or winging Ecchymosis/edema: neg  AC joint, scapula, clavicle: NT Cervical spine: NT, full ROM Spurling's: neg Abduction: 5/5, loss of 45 Flexion: 5/5, loss of 45 IR, full, lift-off: 5/5, with the shoulder at 90 of abduction, minimal internal range of motion is possible ER at neutral: 5/5, with the shoulder at 90, loss of approximately 50 of external rotation AC crossover: positive Impingement signs are essentially equivocal given loss of motion Drop Test: neg Empty Can: pos Supraspinatus insertion: mild-mod T Bicipital groove: NT Speed's: neg Yergason's: neg Sulcus sign: neg Scapular dyskinesis: remarkable elevation and the scapula with abduction C5-T1 intact  Neuro: Sensation intact Grip 5/5    Assessment and Plan: 1.  Adhesive capsulitis, RIGHT shoulder. It is difficult to know this is a primary or secondary adhesive capsulitis. Approximately 10-20% of diabetics Will obtain a primary frozen shoulder at some point in her lifetime. He certainly could have been having some impingement, and then developed a secondary frozen shoulder, but I do not believe that he has a torn cuff.  Recommendations: Patient was given a systematic ROM protocol from [facility] to be done daily. Emphasized importance.  Tylenol or NSAID of choice prn for pain relief  Patient will be sent for formal PT for aggressive frozen shoulder ROM. Will need RTC str and scapular stabilization to fix underlying mechanics.  Intrarticular Shoulder Injection, R Verbal consent was obtained from the patient. Risks, benefits, and alternatives were explained. Patient prepped with Betadine and Ethyl Chloride used for anesthesia. An intraarticular shoulder injection was performed using the posterior approach. The patient tolerated the procedure well and had decreased pain post injection. No complications. Injection: 9 cc of Lidocaine 1% and 1cc of Kenalog  40 mg. Needle: 22 gauge  2. Likely with  some native impingement, rotator cuff tendinopathy and subacromial bursitis given some of his pain history. Mechanical retraining we'll address this as well  I appreciate the opportunity to care for this very pleasant gentleman. Cc: Dr. Ermalene Searing

## 2011-04-04 ENCOUNTER — Telehealth: Payer: Self-pay | Admitting: *Deleted

## 2011-04-04 NOTE — Telephone Encounter (Signed)
[facility] has faxed a form asking to change glumetza ER to something less expensive.  Form is on your desk.

## 2011-04-08 ENCOUNTER — Other Ambulatory Visit: Payer: Self-pay | Admitting: Family Medicine

## 2011-05-03 ENCOUNTER — Ambulatory Visit: Payer: 59 | Admitting: Family Medicine

## 2011-05-03 DIAGNOSIS — Z0289 Encounter for other administrative examinations: Secondary | ICD-10-CM

## 2011-06-11 ENCOUNTER — Emergency Department: Payer: Self-pay | Admitting: Emergency Medicine

## 2011-08-23 ENCOUNTER — Other Ambulatory Visit: Payer: Self-pay | Admitting: Family Medicine

## 2011-08-23 DIAGNOSIS — E119 Type 2 diabetes mellitus without complications: Secondary | ICD-10-CM

## 2011-08-24 ENCOUNTER — Encounter: Payer: Self-pay | Admitting: Endocrinology

## 2011-08-24 ENCOUNTER — Ambulatory Visit (INDEPENDENT_AMBULATORY_CARE_PROVIDER_SITE_OTHER): Payer: 59 | Admitting: Endocrinology

## 2011-08-24 DIAGNOSIS — E119 Type 2 diabetes mellitus without complications: Secondary | ICD-10-CM

## 2011-08-24 NOTE — Progress Notes (Signed)
Subjective:    Patient ID: Philip Ponce, male    DOB: 04-18-1971, 40 y.o.   MRN: [id number]  HPI pt states 20 years h/o dm.  he is unaware of any chronic complications.  he has been on insulin since soon after dx.  pt says his diet and exercise are "ok."  no cbg record, but states cbg's vary from 45 (after a missed meal)-200's.  It is in general higher as the day goes on.  He denies h/o severe hypoglycemia.   Symptomatically, he has few mos os slight blurry vision from both eyes, and assoc weight gain.   Pt brings a detailed "federal diabetes exception form," that he says he needs today in order to keep his job.   Past Medical History  Diagnosis Date  . Diabetes mellitus type I   . Hypertension   . Slipped intervertebral disc     L4 L5    No past surgical history on file.  History   Social History  . Marital Status: Married    Spouse Name: N/A    Number of Children: N/A  . Years of Education: N/A   Occupational History  . Makes airplane filters, vault salesman    Social History Main Topics  . Smoking status: Never Smoker   . Smokeless tobacco: Not on file  . Alcohol Use: No  . Drug Use: No  . Sexually Active: Not on file   Other Topics Concern  . Not on file   Social History Narrative   Regular exercise-yes, walking one mile per dayDiet: fast food, diet soda, unsweeted tea    Current Outpatient Prescriptions on File Prior to Visit  Medication Sig Dispense Refill  . diclofenac (VOLTAREN) 75 MG EC tablet Take 1 tablet (75 mg total) by mouth 2 (two) times daily.  30 tablet  0  . glucose blood test strip 1 each by Other route. Use to test daily to TID as directed Dx. 250.01       . HUMULIN 70/30 (70-30) 100 UNIT/ML injection INJECT 45 UNITS AT 7AM AND 45 UNITS AT 7 PM  30 mL  1  . Insulin Syringe-Needle U-100 (ACCUSURE INS SYR 1CC/31GX5/16") 31G X 5/16" 1 ML MISC Use as directed       . Lancets (ONETOUCH ULTRASOFT) lancets 1 each by Other route 4 (four) times daily.  Use as instructed       . lisinopril-hydrochlorothiazide (PRINZIDE,ZESTORETIC) 20-12.5 MG per tablet Take 2 tablets by mouth daily.  180 tablet  3  . metFORMIN (GLUMETZA) 1000 MG (MOD) 24 hr tablet Take one tablet by mouth daily, if tolerated increase to 2 daily  180 tablet  3    No Known Allergies  Family History  Problem Relation Age of Onset  . Diabetes Mother   . Hypertension Mother   . Cancer Mother     breast  . Diabetes Father   . Hypertension Father   . Cancer Father     colon  . Cancer Maternal Grandfather     prostate  . Stroke Maternal Grandfather     CVA    BP 122/70  Pulse 95  Temp(Src) 99.4 F (37.4 C) (Oral)  Ht 6\' 1"  (1.854 m)  Wt 297 lb 9.6 oz (134.99 kg)  BMI 39.26 kg/m2  SpO2 97%     Review of Systems denies fever, headache, chest pain, sob, n/v, urinary frequency, cramps, excessive diaphoresis, memory loss, depression, numbness, rhinorrhea, and easy bruising.  He has "ED" sxs.  Objective:   Physical Exam VS: see vs page GEN: no distress HEAD: head: no deformity eyes: no periorbital swelling, no proptosis external nose and ears are normal. mouth: no lesion seen NECK: supple, thyroid is not enlarged CHEST WALL: no deformity LUNGS:  Clear to auscultation CV: reg rate and rhythm, no murmur  ABD: abdomen is soft, nontender.  no hepatosplenomegaly.  not distended.  no hernia MUSCULOSKELETAL: muscle bulk and strength are grossly normal.  no obvious joint swelling.  gait is normal and steady EXTEMITIES: no deformity.  no ulcer on the feet.  feet are of normal color and temp.  no edema.  There is bilateral onychomycosis, and dry skin on the feet.   PULSES: dorsalis pedis intact bilat.  no carotid bruit NEURO:  cn 2-12 grossly intact.   readily moves all 4's.  sensation is intact to touch on the feet SKIN:  Normal texture and temperature.  No rash or suspicious lesion is visible.   NODES:  None palpable at the neck PSYCH: alert, oriented x3.   Does not appear anxious nor depressed.   Lab Results  Component Value Date   HGBA1C 8.8* 02/05/2011      Assessment & Plan:  DM.  He does not currently qualify for a cdl "exception," due to fluctuating cbg's Blurry vision.  Pt says this is mild---he may need glasses soon ED, prob due to dm

## 2011-08-24 NOTE — Patient Instructions (Addendum)
good diet and exercise habits significanly improve the control of your diabetes.  please let me know if you wish to be referred to a dietician.  high blood sugar is very risky to your health.  you should see an eye doctor every year.   controlling your blood pressure and cholesterol drastically reduces the damage diabetes does to your body.  this also applies to quitting smoking.  please discuss these with your doctor.  you should take an aspirin every day, unless you have been advised by a doctor not to.   check your blood sugar 2 times a day.  vary the time of day when you check, between before the 3 meals, and at bedtime.  also check if you have symptoms of your blood sugar being too high or too low.  please keep a record of the readings and bring it to your next appointment here.  please call us sooner if your blood sugar goes below 70, or if it stays over 200.  Here are some papers to write it on.   Your blood sugar fluctuates too much to qualify for the insulin "exception" now, but you may be able to qualify for it soon.   blood tests are being requested for you today.  please call [phone] to hear your test results.  You will be prompted to enter the 9-digit "MRN" number that appears at the top left of this page, followed by #.  Then you will hear the message.   Please come back for a follow-up appointment in 2 weeks.

## 2011-08-27 ENCOUNTER — Other Ambulatory Visit (INDEPENDENT_AMBULATORY_CARE_PROVIDER_SITE_OTHER): Payer: 59

## 2011-08-27 DIAGNOSIS — E119 Type 2 diabetes mellitus without complications: Secondary | ICD-10-CM

## 2011-08-27 LAB — COMPREHENSIVE METABOLIC PANEL
ALT: 12 U/L (ref 0–53)
AST: 15 U/L (ref 0–37)
CO2: 31 mEq/L (ref 19–32)
Creatinine, Ser: 1.1 mg/dL (ref 0.4–1.5)
GFR: 93.31 mL/min (ref >60.00)
Sodium: 137 mEq/L (ref 135–145)
Total Bilirubin: 0.4 mg/dL (ref 0.3–1.2)
Total Protein: 8 g/dL (ref 6.0–8.3)

## 2011-08-27 LAB — LIPID PANEL
HDL: 36.4 mg/dL — ABNORMAL LOW (ref >39.00)
LDL Cholesterol: 74 mg/dL (ref 0–99)
Total CHOL/HDL Ratio: 3
VLDL: 16 mg/dL (ref 0.0–40.0)

## 2011-08-27 LAB — HEMOGLOBIN A1C: Hgb A1c MFr Bld: 5.7 % (ref 4.6–6.5)

## 2011-08-29 ENCOUNTER — Telehealth: Payer: Self-pay | Admitting: *Deleted

## 2011-08-29 NOTE — Telephone Encounter (Signed)
Pt's wife called on behalf of pt regarding DOT forms. Pt is keeping a record of CBG's as advised by MD and also has had A1c test. Pt's wife wants to know if DOT forms can be filled out a week in advance (pt was told to F/U in 2 weeks) so forms may be turned in sooner so that pt doesn't lose his job with pt continuing to keep record of CBG and to F/U in 2 weeks as planned-please advise.

## 2011-08-29 NOTE — Telephone Encounter (Signed)
Pt's wife informed of MD's advisement.  

## 2011-08-29 NOTE — Telephone Encounter (Signed)
Please ret as sched, with cbg record.  There needs to be a period of time without hypoglycemia

## 2011-09-07 ENCOUNTER — Ambulatory Visit (INDEPENDENT_AMBULATORY_CARE_PROVIDER_SITE_OTHER): Payer: 59 | Admitting: Endocrinology

## 2011-09-07 ENCOUNTER — Encounter: Payer: Self-pay | Admitting: Endocrinology

## 2011-09-07 VITALS — BP 128/82 | HR 109 | Temp 99.6°F | Ht 72.0 in | Wt 296.6 lb

## 2011-09-07 DIAGNOSIS — E119 Type 2 diabetes mellitus without complications: Secondary | ICD-10-CM

## 2011-09-07 NOTE — Patient Instructions (Addendum)
check your blood sugar 2 times a day.  vary the time of day when you check, between before the 3 meals, and at bedtime.  also check if you have symptoms of your blood sugar being too high or too low.  please keep a record of the readings and bring it to your next appointment here.  please call us sooner if your blood sugar goes below 70, or if it stays over 200.  Here are some papers to write it on.   For now, reduce insulin to 60 units with breakfast, and  35 units with the evening meal.   Please come back for a follow-up appointment for 1 month.  Please make an appointment. Refer to a diabetes education specialist.  you will receive a phone call, about a day and time for an appointment.

## 2011-09-07 NOTE — Progress Notes (Signed)
  Subjective:    Patient ID: Philip Ponce, male    DOB: Jan 18, 1971, 40 y.o.   MRN: [id number]  HPI Pt returns for f/u of insulin-requiring DM.  he brings a record of his cbg's which i have reviewed today.  It varies from 98-147.  It is in general higher as the day goes on.  He takes 60 units qam, and 50 qpm.   Past Medical History  Diagnosis Date  . Diabetes mellitus type I   . Hypertension   . Slipped intervertebral disc     L4 L5    No past surgical history on file.  History   Social History  . Marital Status: Married    Spouse Name: N/A    Number of Children: N/A  . Years of Education: N/A   Occupational History  . Makes airplane filters, vault salesman    Social History Main Topics  . Smoking status: Never Smoker   . Smokeless tobacco: Not on file  . Alcohol Use: No  . Drug Use: No  . Sexually Active: Not on file   Other Topics Concern  . Not on file   Social History Narrative   Regular exercise-yes, walking one mile per dayDiet: fast food, diet soda, unsweeted tea    Current Outpatient Prescriptions on File Prior to Visit  Medication Sig Dispense Refill  . diclofenac (VOLTAREN) 75 MG EC tablet Take 1 tablet (75 mg total) by mouth 2 (two) times daily.  30 tablet  0  . glucose blood test strip 1 each by Other route. Use to test daily to TID as directed Dx. 250.01       . Insulin Syringe-Needle U-100 (ACCUSURE INS SYR 1CC/31GX5/16") 31G X 5/16" 1 ML MISC Use as directed       . Lancets (ONETOUCH ULTRASOFT) lancets 1 each by Other route 4 (four) times daily. Use as instructed       . lisinopril-hydrochlorothiazide (PRINZIDE,ZESTORETIC) 20-12.5 MG per tablet Take 2 tablets by mouth daily.  180 tablet  3  . metFORMIN (GLUMETZA) 1000 MG (MOD) 24 hr tablet Take one tablet by mouth daily, if tolerated increase to 2 daily  180 tablet  3    No Known Allergies  Family History  Problem Relation Age of Onset  . Diabetes Mother   . Hypertension Mother   . Cancer  Mother     breast  . Diabetes Father   . Hypertension Father   . Cancer Father     colon  . Cancer Maternal Grandfather     prostate  . Stroke Maternal Grandfather     CVA    BP 128/82  Pulse 109  Temp(Src) 99.6 F (37.6 C) (Oral)  Ht 6' (1.829 m)  Wt 296 lb 9.6 oz (134.537 kg)  BMI 40.23 kg/m2  SpO2 97%  Review of Systems denies hypoglycemia    Objective:   Physical Exam VITAL SIGNS:  See vs page GENERAL: no distress PSYCH: Alert and oriented x 3.  Does not appear anxious nor depressed.  Lab Results  Component Value Date   HGBA1C 5.7 08/27/2011      Assessment & Plan:  DM, overcontrolled

## 2011-09-09 ENCOUNTER — Encounter: Payer: Self-pay | Admitting: Endocrinology

## 2011-09-09 DIAGNOSIS — Z0289 Encounter for other administrative examinations: Secondary | ICD-10-CM

## 2011-09-11 LAB — HM DIABETES FOOT EXAM

## 2011-09-14 ENCOUNTER — Encounter: Payer: Self-pay | Admitting: Family Medicine

## 2011-10-08 ENCOUNTER — Other Ambulatory Visit: Payer: Self-pay | Admitting: Internal Medicine

## 2011-10-08 NOTE — Telephone Encounter (Signed)
Patient called for refills havent seen in a while but has seen Dr. Everardo All the specialist.  [place] to refill

## 2011-10-09 MED ORDER — METFORMIN HCL ER (MOD) 1000 MG PO TB24: ORAL_TABLET | ORAL | Status: DC

## 2011-10-09 MED ORDER — LISINOPRIL-HYDROCHLOROTHIAZIDE 20-12.5 MG PO TABS: 2.0000 | ORAL_TABLET | Freq: Every day | ORAL | Status: DC

## 2011-10-09 MED ORDER — INSULIN NPH ISOPHANE & REGULAR (70-30) 100 UNIT/ML ~~LOC~~ SUSP: SUBCUTANEOUS | Status: DC

## 2011-10-12 ENCOUNTER — Ambulatory Visit: Payer: 59 | Admitting: Endocrinology

## 2011-10-17 ENCOUNTER — Other Ambulatory Visit: Payer: Self-pay | Admitting: *Deleted

## 2011-10-17 MED ORDER — INSULIN NPH ISOPHANE & REGULAR (70-30) 100 UNIT/ML ~~LOC~~ SUSP: SUBCUTANEOUS | Status: DC

## 2011-10-19 ENCOUNTER — Ambulatory Visit: Payer: 59 | Admitting: Endocrinology

## 2011-10-26 ENCOUNTER — Ambulatory Visit: Payer: 59 | Admitting: Endocrinology

## 2011-11-16 ENCOUNTER — Ambulatory Visit: Payer: 59 | Admitting: Endocrinology

## 2012-04-01 ENCOUNTER — Other Ambulatory Visit: Payer: Self-pay | Admitting: *Deleted

## 2012-04-01 MED ORDER — LISINOPRIL-HYDROCHLOROTHIAZIDE 20-12.5 MG PO TABS: 2.0000 | ORAL_TABLET | Freq: Every day | ORAL | Status: DC

## 2012-04-09 ENCOUNTER — Other Ambulatory Visit: Payer: Self-pay

## 2012-04-09 NOTE — Telephone Encounter (Signed)
Pt request refill Humulin 70/30 insulin sent to [facility]. Pt is not out of med. Pt last seen 02/27/11; no future appt scheduled. Please advise.

## 2012-04-10 NOTE — Telephone Encounter (Signed)
needs appt  Scheduled then refill till then.

## 2012-04-11 NOTE — Telephone Encounter (Signed)
Unable to contact patient so left message to call and schedule appt and then medication will be filled

## 2012-04-18 ENCOUNTER — Encounter: Payer: Self-pay | Admitting: Family Medicine

## 2012-04-18 ENCOUNTER — Ambulatory Visit (INDEPENDENT_AMBULATORY_CARE_PROVIDER_SITE_OTHER): Payer: 59 | Admitting: Family Medicine

## 2012-04-18 VITALS — BP 130/88 | HR 105 | Temp 99.0°F | Ht 72.0 in | Wt 293.8 lb

## 2012-04-18 DIAGNOSIS — I10 Essential (primary) hypertension: Secondary | ICD-10-CM

## 2012-04-18 DIAGNOSIS — N529 Male erectile dysfunction, unspecified: Secondary | ICD-10-CM

## 2012-04-18 DIAGNOSIS — E119 Type 2 diabetes mellitus without complications: Secondary | ICD-10-CM

## 2012-04-18 DIAGNOSIS — E7800 Pure hypercholesterolemia, unspecified: Secondary | ICD-10-CM

## 2012-04-18 MED ORDER — LOSARTAN POTASSIUM-HCTZ 100-25 MG PO TABS: 1.0000 | ORAL_TABLET | Freq: Every day | ORAL | Status: DC

## 2012-04-18 MED ORDER — VARDENAFIL HCL 20 MG PO TABS: 20.0000 mg | ORAL_TABLET | Freq: Every day | ORAL | Status: DC | PRN

## 2012-04-18 NOTE — Patient Instructions (Addendum)
Use levitra for erectile issues.   Return for fasting labs in the next week. Look into Victoza and Byetta coverage with insurance. Work on increasing exercise and low carb diet.  Call [facility] [facility] for b[facility]ric surgery seminar.

## 2012-04-18 NOTE — Progress Notes (Signed)
  Subjective:    Patient ID: Fransico Setters, male    DOB: 1971/04/10, 41 y.o.   MRN: [id number]  HPI  Diabetes: Due for re-eval. On metformin and 70/30 60 and 40 Units daily Lab Results  Component Value Date   HGBA1C 5.7 08/27/2011   Using medications without difficulties: Hypoglycemic episodes:None Hyperglycemic episodes:None Feet problems:None Blood Sugars averaging: 100 fasting  eye exam within last year:has glasses, 07/2012 Saw Dr. Everardo All  Hypertension:  Well controlled on lisinopril HCTZ  Using medication without problems or lightheadedness: yes see below. Does have some erectile dysfunction associated with med. Chest pain with exertion:None Edema:None Short of breath:None, but does have frequent coughing fits.. Likely due to med. Average home BPs: Other issues:  Elevated Cholesterol:  Due for re-eval. On no medication. Lab Results  Component Value Date   CHOL 126 08/27/2011   HDL 36.40* 08/27/2011   LDLCALC 74 08/27/2011   TRIG 80.0 08/27/2011   CHOLHDL 3 08/27/2011    Using medications without problems: None Diet compliance: Moderate Exercise: Occ Other complaints:    Review of Systems  Constitutional: Negative for fever and fatigue.  HENT: Negative for nosebleeds.   Respiratory: Negative for shortness of breath.   Cardiovascular: Negative for chest pain.  Gastrointestinal: Negative for abdominal pain.       Objective:   Physical Exam  Constitutional: Vital signs are normal. He appears well-developed and well-nourished.  HENT:  Head: Normocephalic.  Right Ear: Hearing normal.  Left Ear: Hearing normal.  Nose: Nose normal.  Mouth/Throat: Oropharynx is clear and moist and mucous membranes are normal.  Neck: Trachea normal. Carotid bruit is not present. No mass and no thyromegaly present.  Cardiovascular: Normal rate, regular rhythm and normal pulses.  Exam reveals no gallop, no distant heart sounds and no friction rub.   No murmur heard.      No  peripheral edema  Pulmonary/Chest: Effort normal and breath sounds normal. No respiratory distress.  Skin: Skin is warm, dry and intact. No rash noted.  Psychiatric: He has a normal mood and affect. His speech is normal and behavior is normal. Thought content normal.    Diabetic foot exam: Normal inspection No skin breakdown No calluses  Normal DP pulses Normal sensation to light touch and monofilament Nails normal         Assessment & Plan:

## 2012-05-01 ENCOUNTER — Telehealth: Payer: Self-pay

## 2012-05-01 NOTE — Assessment & Plan Note (Signed)
Well controlled. LDL at goal <100.

## 2012-05-01 NOTE — Telephone Encounter (Signed)
Pt left v/m wanting to verify which med he should be taking; stated was taken off med recently. Left v/m for pt to call back.

## 2012-05-01 NOTE — Assessment & Plan Note (Signed)
Inadequate control. Look into Victoza and Byetta coverage with insurance. Work on increasing exercise and low carb diet.  Call [facility] for bariatric surgery seminar.

## 2012-05-01 NOTE — Assessment & Plan Note (Signed)
Trial of levitra  

## 2012-05-01 NOTE — Assessment & Plan Note (Signed)
Stop Lisinopril/hctz due to  SE, change to losartan /HCTZ.  Check blood pressure regularly.

## 2012-05-08 NOTE — Telephone Encounter (Signed)
Spoke with pts wife; pt was wanting to know if needed to take both Metformin and Losartan HCTZ. Pt and wife reviewed AVS from recent visit and decided pt should continue taking both meds; pt's question resolved.

## 2012-05-23 ENCOUNTER — Other Ambulatory Visit (INDEPENDENT_AMBULATORY_CARE_PROVIDER_SITE_OTHER): Payer: 59

## 2012-05-23 ENCOUNTER — Telehealth: Payer: Self-pay | Admitting: Radiology

## 2012-05-23 DIAGNOSIS — E119 Type 2 diabetes mellitus without complications: Secondary | ICD-10-CM

## 2012-05-23 DIAGNOSIS — I10 Essential (primary) hypertension: Secondary | ICD-10-CM

## 2012-05-23 DIAGNOSIS — E7800 Pure hypercholesterolemia, unspecified: Secondary | ICD-10-CM

## 2012-05-23 LAB — COMPREHENSIVE METABOLIC PANEL
ALT: 22 U/L (ref 0–53)
Albumin: 3.8 g/dL (ref 3.5–5.2)
CO2: 30 mEq/L (ref 19–32)
Chloride: 104 mEq/L (ref 96–112)
GFR: 98.06 mL/min (ref >60.00)
Glucose, Bld: 31 mg/dL — CL (ref 70–99)
Potassium: 3.5 mEq/L (ref 3.5–5.1)
Sodium: 139 mEq/L (ref 135–145)
Total Bilirubin: 0.4 mg/dL (ref 0.3–1.2)
Total Protein: 8.4 g/dL — ABNORMAL HIGH (ref 6.0–8.3)

## 2012-05-23 LAB — LIPID PANEL
HDL: 41.6 mg/dL (ref >39.00)
Total CHOL/HDL Ratio: 3

## 2012-05-23 LAB — HEMOGLOBIN A1C: Hgb A1c MFr Bld: 7.7 % — ABNORMAL HIGH (ref 4.6–6.5)

## 2012-05-23 NOTE — Telephone Encounter (Signed)
Reviewed and agreed.  His 70/30 insulin on an empty stomach is what dropped his CBG low.

## 2012-05-23 NOTE — Telephone Encounter (Signed)
Patient notified as instructed by telephone. Pt took his insulin this AM before lab test done. After pt ate he felt OK; feels fine now; on way out of town cannot ck BS now. FBS usually 110-120. Pt will ck daily FBS and if runs low will notify Dr Ermalene Searing. Pt will ck work schedule and call back for next appt. Pt instructed do not take insulin prior to getting blood drawn;get blood drawn,insulin and then eat.Pt understood.

## 2012-05-23 NOTE — Telephone Encounter (Signed)
Call pt immediately... Let him know blood sugar was low.. Make sure he has eaten and rechecked his CBG.   Ask if he has been having frequent CBGs less than 60 on his current insulin dose. ;let me know. Has upcoming apt for follow up.

## 2012-05-23 NOTE — Telephone Encounter (Signed)
[facility] called a critical Glucose - 31

## 2012-06-03 ENCOUNTER — Encounter: Payer: Self-pay | Admitting: *Deleted

## 2012-08-01 ENCOUNTER — Other Ambulatory Visit: Payer: Self-pay | Admitting: *Deleted

## 2012-08-01 MED ORDER — METFORMIN HCL ER (MOD) 1000 MG PO TB24: ORAL_TABLET | ORAL | Status: DC

## 2012-08-04 ENCOUNTER — Ambulatory Visit: Payer: Self-pay | Admitting: Bariatrics

## 2012-08-04 ENCOUNTER — Telehealth: Payer: Self-pay | Admitting: *Deleted

## 2012-08-04 LAB — CBC WITH DIFFERENTIAL/PLATELET
Basophil #: 0 10*3/uL (ref 0.0–0.1)
Eosinophil %: 1.8 %
Lymphocyte #: 1.6 10*3/uL (ref 1.0–3.6)
MCH: 28.2 pg (ref 26.0–34.0)
MCV: 87 fL (ref 80–100)
Monocyte #: 0.6 x10 3/mm (ref 0.2–1.0)
Platelet: 271 10*3/uL (ref 150–440)
RDW: 13.4 % (ref 11.5–14.5)
WBC: 6.9 10*3/uL (ref 3.8–10.6)

## 2012-08-04 LAB — IRON AND TIBC
Iron Bind.Cap.(Total): 270 ug/dL (ref 250–450)
Iron Saturation: 32 %
Unbound Iron-Bind.Cap.: 183 ug/dL

## 2012-08-04 LAB — PROTIME-INR: Prothrombin Time: 13.7 secs (ref 11.5–14.7)

## 2012-08-04 LAB — COMPREHENSIVE METABOLIC PANEL
Albumin: 3.6 g/dL (ref 3.4–5.0)
Anion Gap: 7 (ref 7–16)
Calcium, Total: 8.8 mg/dL (ref 8.5–10.1)
EGFR (African American): >60
Glucose: 115 mg/dL — ABNORMAL HIGH (ref 65–99)
Potassium: 3.9 mmol/L (ref 3.5–5.1)
SGOT(AST): 18 U/L (ref 15–37)
SGPT (ALT): 25 U/L (ref 12–78)

## 2012-08-04 LAB — BILIRUBIN, DIRECT: Bilirubin, Direct: 0.1 mg/dL (ref 0.00–0.20)

## 2012-08-04 LAB — AMYLASE: Amylase: 45 U/L (ref 25–115)

## 2012-08-04 LAB — TSH: Thyroid Stimulating Horm: 0.549 u[IU]/mL

## 2012-08-04 NOTE — Telephone Encounter (Signed)
Pharmacy sent request to change Glumetza 1000mg  to metformin 1000mg , the Glumetza cost $571.64. Is this ok?

## 2012-08-05 NOTE — Telephone Encounter (Signed)
Pharmacy advised as written below

## 2012-08-05 NOTE — Telephone Encounter (Signed)
Okay to change as long as it is the metformin 24 hour release.

## 2012-10-01 ENCOUNTER — Other Ambulatory Visit: Payer: Self-pay | Admitting: Family Medicine

## 2012-10-07 ENCOUNTER — Ambulatory Visit (INDEPENDENT_AMBULATORY_CARE_PROVIDER_SITE_OTHER): Payer: 59 | Admitting: Family Medicine

## 2012-10-07 ENCOUNTER — Encounter: Payer: Self-pay | Admitting: Family Medicine

## 2012-10-07 VITALS — BP 128/80 | HR 100 | Temp 98.6°F | Wt 298.0 lb

## 2012-10-07 DIAGNOSIS — E7800 Pure hypercholesterolemia, unspecified: Secondary | ICD-10-CM

## 2012-10-07 DIAGNOSIS — E119 Type 2 diabetes mellitus without complications: Secondary | ICD-10-CM

## 2012-10-07 DIAGNOSIS — Z23 Encounter for immunization: Secondary | ICD-10-CM

## 2012-10-07 DIAGNOSIS — I10 Essential (primary) hypertension: Secondary | ICD-10-CM

## 2012-10-07 MED ORDER — LOSARTAN POTASSIUM-HCTZ 100-25 MG PO TABS: 1.0000 | ORAL_TABLET | Freq: Every day | ORAL | Status: DC

## 2012-10-07 MED ORDER — INSULIN NPH ISOPHANE & REGULAR (70-30) 100 UNIT/ML ~~LOC~~ SUSP: 60.0000 [IU] | Freq: Every day | SUBCUTANEOUS | Status: DC

## 2012-10-07 MED ORDER — "INSULIN SYRINGE-NEEDLE U-100 31G X 5/16"" 1 ML MISC": Status: DC

## 2012-10-07 MED ORDER — METFORMIN HCL 1000 MG PO TABS: 1000.0000 mg | ORAL_TABLET | Freq: Two times a day (BID) | ORAL | Status: DC

## 2012-10-07 NOTE — Assessment & Plan Note (Signed)
Encouraged exercise, weight loss, healthy eating habits. Bariatric surgery is a great idea. Continue current meds.. Due for A1C.. May need to adjust.

## 2012-10-07 NOTE — Progress Notes (Signed)
  Subjective:    Patient ID: Fransico Setters, male    DOB: 08-14-1971, 42 y.o.   MRN: [id number]  HPI  42 year old male  Here for folow up   Hypertension: At goal on Losartan/HCTZ   Using medication without problems or lightheadedness:  none Chest pain with exertion:None Edema:None Short of breath:None Average home BPs:not checking Other issues:  Diabetes:  Due fo re-eval. On 70/30  60 Units in AM and 45 Units in AM and metformin.  Ran out of metformin in last week Lab Results  Component Value Date   HGBA1C 7.7* 05/23/2012  Using medications without difficulties: Hypoglycemic episodes: None Hyperglycemic episodes: yes around holiday Feet problems: no ulcers Blood Sugars averaging: When has both meds :FBS  102, 2 hours after meals not checking, before dinner...130 eye exam within last year:  yes Plans bariatric surgery this year. Gastric bypass with Dr. Burgess Estelle. We will get A1c RESULTS from their recent labs  Elevated Cholesterol:  At goal on no med Lab Results  Component Value Date   CHOL 111 05/23/2012   HDL 41.60 05/23/2012   LDLCALC 54 05/23/2012   TRIG 79.0 05/23/2012   CHOLHDL 3 05/23/2012  Diet compliance: Moderate Exercise: walking, but limited Other complaints:     Review of Systems  Constitutional: Negative for fever and fatigue.  HENT: Negative for ear pain.   Eyes: Negative for pain.  Respiratory: Negative for shortness of breath and wheezing.   Cardiovascular: Negative for chest pain.  Gastrointestinal: Negative for abdominal pain.  Genitourinary: Negative for dysuria.  Psychiatric/Behavioral: Negative for dysphoric mood.       Objective:   Physical Exam  Constitutional: Vital signs are normal. He appears well-developed and well-nourished.       obese  HENT:  Head: Normocephalic.  Right Ear: Hearing normal.  Left Ear: Hearing normal.  Nose: Nose normal.  Mouth/Throat: Oropharynx is clear and moist and mucous membranes are normal.  Neck: Trachea  normal. Carotid bruit is not present. No mass and no thyromegaly present.  Cardiovascular: Normal rate, regular rhythm and normal pulses.  Exam reveals no gallop, no distant heart sounds and no friction rub.   No murmur heard.      No peripheral edema  Pulmonary/Chest: Effort normal and breath sounds normal. No respiratory distress.  Skin: Skin is warm, dry and intact. No rash noted.  Psychiatric: He has a normal mood and affect. His speech is normal and behavior is normal. Thought content normal.   Diabetic foot exam: Normal inspection No skin breakdown No calluses  Normal DP pulses Normal sensation to light touch and monofilament Nails thickened        Assessment & Plan:   Given flu shot today.

## 2012-10-07 NOTE — Assessment & Plan Note (Signed)
Well controlled. Continue current medication. BP Readings from Last 3 Encounters:  10/07/12 128/80  04/18/12 130/88  09/07/11 128/82

## 2012-10-07 NOTE — Patient Instructions (Signed)
Work on exercise, weight loss, healthy eating habits. I think bariatric surgery is a great idea! Follow up DM in 3 months.

## 2012-10-07 NOTE — Addendum Note (Signed)
Addended by: Consuello Masse on: 10/07/2012 03:39 PM   Modules accepted: Orders

## 2012-10-07 NOTE — Assessment & Plan Note (Signed)
Well controlled at last check. 

## 2012-10-23 LAB — HM DIABETES EYE EXAM

## 2012-11-18 ENCOUNTER — Encounter: Payer: Self-pay | Admitting: Family Medicine

## 2012-12-15 ENCOUNTER — Ambulatory Visit: Payer: Self-pay | Admitting: Bariatrics

## 2012-12-23 ENCOUNTER — Ambulatory Visit: Payer: Self-pay | Admitting: Bariatrics

## 2012-12-28 ENCOUNTER — Telehealth: Payer: Self-pay | Admitting: Family Medicine

## 2012-12-28 DIAGNOSIS — E119 Type 2 diabetes mellitus without complications: Secondary | ICD-10-CM

## 2012-12-28 NOTE — Telephone Encounter (Signed)
Message copied by Excell Seltzer on Sun Dec 28, 2012 11:00 PM ------      Message from: Alvina Chou      Created: Thu Dec 25, 2012  9:09 AM      Regarding: Lab orders for Thursday 4.10.14       Lab orders for f/u appt ------

## 2013-01-01 ENCOUNTER — Other Ambulatory Visit (INDEPENDENT_AMBULATORY_CARE_PROVIDER_SITE_OTHER): Payer: 59

## 2013-01-01 DIAGNOSIS — E119 Type 2 diabetes mellitus without complications: Secondary | ICD-10-CM

## 2013-01-01 LAB — COMPREHENSIVE METABOLIC PANEL
ALT: 20 U/L (ref 0–53)
AST: 17 U/L (ref 0–37)
Creatinine, Ser: 1.1 mg/dL (ref 0.4–1.5)
Total Bilirubin: 0.3 mg/dL (ref 0.3–1.2)

## 2013-01-06 ENCOUNTER — Ambulatory Visit: Payer: 59 | Admitting: Family Medicine

## 2013-01-06 DIAGNOSIS — Z0289 Encounter for other administrative examinations: Secondary | ICD-10-CM

## 2013-01-22 ENCOUNTER — Other Ambulatory Visit: Payer: Self-pay | Admitting: Family Medicine

## 2013-09-24 DIAGNOSIS — I447 Left bundle-branch block, unspecified: Secondary | ICD-10-CM | POA: Insufficient documentation

## 2013-09-24 DIAGNOSIS — G4733 Obstructive sleep apnea (adult) (pediatric): Secondary | ICD-10-CM | POA: Insufficient documentation

## 2013-09-25 DIAGNOSIS — R9439 Abnormal result of other cardiovascular function study: Secondary | ICD-10-CM | POA: Insufficient documentation

## 2015-03-19 LAB — HM DIABETES EYE EXAM

## 2015-04-07 ENCOUNTER — Ambulatory Visit (INDEPENDENT_AMBULATORY_CARE_PROVIDER_SITE_OTHER): Payer: 59 | Admitting: Family Medicine

## 2015-04-07 ENCOUNTER — Encounter: Payer: Self-pay | Admitting: Family Medicine

## 2015-04-07 VITALS — BP 148/88 | HR 74 | Temp 97.8°F | Ht 70.5 in | Wt 200.5 lb

## 2015-04-07 DIAGNOSIS — E119 Type 2 diabetes mellitus without complications: Secondary | ICD-10-CM

## 2015-04-07 DIAGNOSIS — I10 Essential (primary) hypertension: Secondary | ICD-10-CM | POA: Diagnosis not present

## 2015-04-07 DIAGNOSIS — I4510 Unspecified right bundle-branch block: Secondary | ICD-10-CM | POA: Insufficient documentation

## 2015-04-07 DIAGNOSIS — E7800 Pure hypercholesterolemia, unspecified: Secondary | ICD-10-CM

## 2015-04-07 DIAGNOSIS — Z9884 Bariatric surgery status: Secondary | ICD-10-CM | POA: Diagnosis not present

## 2015-04-07 DIAGNOSIS — I509 Heart failure, unspecified: Secondary | ICD-10-CM

## 2015-04-07 LAB — HM DIABETES FOOT EXAM

## 2015-04-07 NOTE — Assessment & Plan Note (Addendum)
INadequate control off all meds.  Follow at home. If up , or decreased EF on ECHO or  Microalbuminuria.. Will restart BP med.

## 2015-04-07 NOTE — Assessment & Plan Note (Signed)
Due fpor re-eval. May be able to stop meds.

## 2015-04-07 NOTE — Progress Notes (Signed)
44 year old malepresents for follow up DM. He has not been seen here since 2014. Over due for lab eval.  Since las OV he has had bariatric surgery 08/2014 and has lost over 100 lbs per pt!  He is feeling better overall.   After surgery  ( at [facility]) he had heart eval.. Told  LBB,CHF.Marland Kitchen Placed on lasix. lisinopril and coreg .Marland Kitchen He has not been on these meds in last 2 weeks. NO SOB. No edema.  Pre [facility] records in care everywhere...  Abnormal stress test lead to Heart cath on 09/2013.Marland Kitchen Low EF at 45% and no CAD.   Wt Readings from Last 3 Encounters:  04/07/15 200 lb 8 oz (90.946 kg)  10/07/12 298 lb (135.172 kg)  04/18/12 293 lb 12 oz (133.244 kg)   Hypertension: No longer  on Lisinopril, coreg and lasix since bariatric surgery. BP Readings from Last 3 Encounters:  04/07/15 148/88  10/07/12 128/80  04/18/12 130/88  Using medication without problems or lightheadedness: none Chest pain with exertion:None Edema:None Short of breath:None Average home BPs:not checking Other issues:  Diabetes: Due fo re-eval. On 70/30 10 Units in AM  He is no longer on metformin. Lab Results  Component Value Date   HGBA1C 8.6* 01/01/2013  Using medications without difficulties: Hypoglycemic episodes: None Hyperglycemic episodes: yes around holiday Feet problems: no ulcers Blood Sugars averaging:  Has not been checking lately. eye exam within last year: 04/2014  Elevated Cholesterol: Previously at goal on no med Lab Results  Component Value Date   CHOL 111 05/23/2012   HDL 41.60 05/23/2012   LDLCALC 54 05/23/2012   TRIG 79.0 05/23/2012   CHOLHDL 3 05/23/2012  Diet compliance: Good. Exercise: Going to gym, several times a week. Other complaints:   Review of Systems  Constitutional: Negative for fever and fatigue.  HENT: Negative for ear pain.  Eyes: Negative for pain.  Respiratory: Negative for shortness of breath and wheezing.  Cardiovascular: Negative for chest pain.   Gastrointestinal: Negative for abdominal pain.  Genitourinary: Negative for dysuria.  Psychiatric/Behavioral: Negative for dysphoric mood.       Objective:   Physical Exam  Constitutional: Vital signs are normal. He appears well-developed and well-nourished.  HENT:  Head: Normocephalic.  Right Ear: Hearing normal.  Left Ear: Hearing normal.  Nose: Nose normal.  Mouth/Throat: Oropharynx is clear and moist and mucous membranes are normal.  Neck: Trachea normal. Carotid bruit is not present. No mass and no thyromegaly present.  Cardiovascular: Normal rate, regular rhythm and normal pulses. Exam reveals no gallop, no distant heart sounds and no friction rub.  No murmur heard.  No peripheral edema  Pulmonary/Chest: Effort normal and breath sounds normal. No respiratory distress.  Skin: Skin is warm, dry and intact. No rash noted.  Psychiatric: He has a normal mood and affect. His speech is normal and behavior is normal. Thought content normal.   Diabetic foot exam: Normal inspection No skin breakdown No calluses  Normal DP pulses Normal sensation to light touch and monofilament Nails thickened

## 2015-04-07 NOTE — Patient Instructions (Addendum)
Return for fasting labs next week. Make sure to see eye MD yearly. Check blood pressure few times a week  For 1-2 week, then call with results.  Stop at front desk to set up ECHO to re-eval heart function.

## 2015-04-07 NOTE — Progress Notes (Signed)
Pre visit review using our clinic review tool, if applicable. No additional management support is needed unless otherwise documented below in the visit note. 

## 2015-04-07 NOTE — Assessment & Plan Note (Signed)
Prior to 100 lb weight loss.  Will re-eval off meds with ECHO.

## 2015-04-07 NOTE — Assessment & Plan Note (Signed)
Due for re-eval. 

## 2015-04-14 ENCOUNTER — Ambulatory Visit (INDEPENDENT_AMBULATORY_CARE_PROVIDER_SITE_OTHER): Payer: 59

## 2015-04-14 ENCOUNTER — Other Ambulatory Visit: Payer: Self-pay

## 2015-04-14 DIAGNOSIS — I509 Heart failure, unspecified: Secondary | ICD-10-CM

## 2015-04-15 ENCOUNTER — Other Ambulatory Visit (INDEPENDENT_AMBULATORY_CARE_PROVIDER_SITE_OTHER): Payer: 59

## 2015-04-15 DIAGNOSIS — E7800 Pure hypercholesterolemia, unspecified: Secondary | ICD-10-CM

## 2015-04-15 DIAGNOSIS — E119 Type 2 diabetes mellitus without complications: Secondary | ICD-10-CM

## 2015-04-15 LAB — COMPREHENSIVE METABOLIC PANEL
ALT: 14 U/L (ref 0–53)
AST: 17 U/L (ref 0–37)
Albumin: 3.8 g/dL (ref 3.5–5.2)
Alkaline Phosphatase: 77 U/L (ref 39–117)
BUN: 12 mg/dL (ref 6–23)
CO2: 33 mEq/L — ABNORMAL HIGH (ref 19–32)
Calcium: 9 mg/dL (ref 8.4–10.5)
Chloride: 103 mEq/L (ref 96–112)
Creatinine, Ser: 0.9 mg/dL (ref 0.40–1.50)
GFR: 116.88 mL/min (ref >60.00)
Glucose, Bld: 133 mg/dL — ABNORMAL HIGH (ref 70–99)
POTASSIUM: 4.3 meq/L (ref 3.5–5.1)
Sodium: 140 mEq/L (ref 135–145)
TOTAL PROTEIN: 7.3 g/dL (ref 6.0–8.3)
Total Bilirubin: 0.4 mg/dL (ref 0.2–1.2)

## 2015-04-15 LAB — LIPID PANEL
CHOLESTEROL: 101 mg/dL (ref 0–200)
HDL: 51.4 mg/dL (ref >39.00)
LDL CALC: 38 mg/dL (ref 0–99)
NonHDL: 49.6
TRIGLYCERIDES: 58 mg/dL (ref 0.0–149.0)
Total CHOL/HDL Ratio: 2
VLDL: 11.6 mg/dL (ref 0.0–40.0)

## 2015-04-15 LAB — MICROALBUMIN / CREATININE URINE RATIO
Creatinine,U: 163.7 mg/dL
MICROALB UR: 1.2 mg/dL (ref 0.0–1.9)
Microalb Creat Ratio: 0.7 mg/g (ref 0.0–30.0)

## 2015-04-15 LAB — HEMOGLOBIN A1C: HEMOGLOBIN A1C: 7 % — AB (ref 4.6–6.5)

## 2015-04-15 NOTE — Addendum Note (Signed)
Addended by: Ellamae Sia on: 04/15/2015 08:18 AM   Modules accepted: Orders

## 2015-04-20 ENCOUNTER — Telehealth: Payer: Self-pay | Admitting: Family Medicine

## 2015-04-20 NOTE — Telephone Encounter (Signed)
Pt called he stated he wants to take  Metformin [facility] [address]  Pt would like a call before 12.  He would like to know dosage and has ? About med

## 2015-04-20 NOTE — Telephone Encounter (Signed)
Pt called returning Donna's call. You can reach him at [phone].

## 2015-04-20 NOTE — Telephone Encounter (Signed)
Left message for Philip Ponce to return my call.

## 2015-04-20 NOTE — Telephone Encounter (Signed)
Spoke with Philip Ponce.  He would like to stop his insulin and start on Metformin.  Would like Rx sent to [facility] [address]

## 2015-04-21 MED ORDER — METFORMIN HCL ER 500 MG PO TB24: 500.0000 mg | ORAL_TABLET | Freq: Every day | ORAL | Status: DC

## 2015-04-21 NOTE — Telephone Encounter (Signed)
OK great. Stop insulin and start low dose metformin. I sent in.   Follow CBGs at home and call if FBS > 120 or 2 hour post prandial > 180.  Make sure he has appt for DM follow up and A1C in 3 months.

## 2015-04-21 NOTE — Telephone Encounter (Signed)
Voice mail not set up. Unable to leave a message.

## 2015-04-22 NOTE — Telephone Encounter (Signed)
[place] notified as instructed by telephone.  Follow up appointment scheduled for 07/19/2015 at 8:00 am with Dr. [name].

## 2015-07-15 ENCOUNTER — Other Ambulatory Visit: Payer: 59

## 2015-07-15 ENCOUNTER — Telehealth: Payer: Self-pay | Admitting: Family Medicine

## 2015-07-15 DIAGNOSIS — E119 Type 2 diabetes mellitus without complications: Secondary | ICD-10-CM

## 2015-07-15 NOTE — Telephone Encounter (Signed)
-----   Message from Ellamae Sia sent at 07/08/2015 11:46 AM EDT ----- Regarding: Lab orders for Friday,10.21.16 Lab orders for a f/u appt

## 2015-07-19 ENCOUNTER — Ambulatory Visit (INDEPENDENT_AMBULATORY_CARE_PROVIDER_SITE_OTHER): Payer: 59 | Admitting: Family Medicine

## 2015-07-19 ENCOUNTER — Encounter: Payer: Self-pay | Admitting: Family Medicine

## 2015-07-19 VITALS — BP 130/82 | HR 74 | Temp 98.5°F | Ht 70.5 in | Wt 183.5 lb

## 2015-07-19 DIAGNOSIS — Z23 Encounter for immunization: Secondary | ICD-10-CM

## 2015-07-19 DIAGNOSIS — Z9889 Other specified postprocedural states: Secondary | ICD-10-CM | POA: Diagnosis not present

## 2015-07-19 DIAGNOSIS — E119 Type 2 diabetes mellitus without complications: Secondary | ICD-10-CM

## 2015-07-19 DIAGNOSIS — Z9884 Bariatric surgery status: Secondary | ICD-10-CM

## 2015-07-19 DIAGNOSIS — I10 Essential (primary) hypertension: Secondary | ICD-10-CM | POA: Diagnosis not present

## 2015-07-19 LAB — HM DIABETES FOOT EXAM

## 2015-07-19 NOTE — Progress Notes (Signed)
Pre visit review using our clinic review tool, if applicable. No additional management support is needed unless otherwise documented below in the visit note. 

## 2015-07-19 NOTE — Patient Instructions (Addendum)
Keep up great work on healthy eating! Try to start regular exercise! Walk more with family, etc. Continue metformin.

## 2015-07-19 NOTE — Assessment & Plan Note (Signed)
Excellent improvement in DM control s/p gastric bypass. On metformin alone, off insulin. Encouraged exercise, weight maintenance, healthy eating habits.

## 2015-07-19 NOTE — Progress Notes (Signed)
   Subjective:    Patient ID: Philip Ponce, male    DOB: 1971-09-16, 44 y.o.   MRN: [id number]  HPI  44 year old male presents for follow up DM.   Diabetes:   Well controlled on metformin 500 mg daily ALONE. No longer on insulin. Lab Results  Component Value Date   HGBA1C 7.0* 04/15/2015  Using medications without difficulties: None Hypoglycemic episodes: None Hyperglycemic episodes:None Feet problems:none Blood Sugars averaging: not checking eye exam within last year: yes.. Diabetic retinopathy improved.  Moderate diet: no longer eating sweets, bread, eats a lot of protein. Exercise: none  He has continued to lose weight and now Body mass index is 25.95 kg/(m^2).  Wt Readings from Last 3 Encounters:  07/19/15 183 lb 8 oz (83.235 kg)  04/07/15 200 lb 8 oz (90.946 kg)  10/07/12 298 lb (135.172 kg)   Hypertension:    Now on no med. Using medication without problems or lightheadedness:  Chest pain with exertion: Edema: Short of breath: Average home BPs: Other issues:  BP Readings from Last 3 Encounters:  07/19/15 130/82  04/07/15 148/88  10/07/12 128/80       Review of Systems  Constitutional: Negative for fever and fatigue.  HENT: Negative for ear pain.   Eyes: Negative for pain.  Respiratory: Negative for cough and shortness of breath.   Cardiovascular: Negative for chest pain.  Gastrointestinal: Negative for abdominal pain.       Objective:   Physical Exam  Constitutional: Vital signs are normal. He appears well-developed and well-nourished.  HENT:  Head: Normocephalic.  Right Ear: Hearing normal.  Left Ear: Hearing normal.  Nose: Nose normal.  Mouth/Throat: Oropharynx is clear and moist and mucous membranes are normal.  Neck: Trachea normal. Carotid bruit is not present. No thyroid mass and no thyromegaly present.  Cardiovascular: Normal rate, regular rhythm and normal pulses.  Exam reveals no gallop, no distant heart sounds and no friction rub.    No murmur heard. No peripheral edema  Pulmonary/Chest: Effort normal and breath sounds normal. No respiratory distress.  Skin: Skin is warm, dry and intact. No rash noted.  Psychiatric: He has a normal mood and affect. His speech is normal and behavior is normal. Thought content normal.    Diabetic foot exam: Normal inspection No skin breakdown No calluses  Normal DP pulses Normal sensation to light touch and monofilament Nails normal       Assessment & Plan:

## 2015-07-19 NOTE — Assessment & Plan Note (Signed)
Essentially resolve , off med since gastric bypass.

## 2015-10-07 ENCOUNTER — Telehealth: Payer: Self-pay | Admitting: Family Medicine

## 2015-10-07 ENCOUNTER — Other Ambulatory Visit (INDEPENDENT_AMBULATORY_CARE_PROVIDER_SITE_OTHER): Payer: 59

## 2015-10-07 DIAGNOSIS — Z125 Encounter for screening for malignant neoplasm of prostate: Secondary | ICD-10-CM

## 2015-10-07 DIAGNOSIS — E119 Type 2 diabetes mellitus without complications: Secondary | ICD-10-CM | POA: Diagnosis not present

## 2015-10-07 LAB — COMPREHENSIVE METABOLIC PANEL
ALT: 12 U/L (ref 0–53)
AST: 14 U/L (ref 0–37)
Albumin: 4 g/dL (ref 3.5–5.2)
Alkaline Phosphatase: 78 U/L (ref 39–117)
BILIRUBIN TOTAL: 0.6 mg/dL (ref 0.2–1.2)
BUN: 15 mg/dL (ref 6–23)
CALCIUM: 9.4 mg/dL (ref 8.4–10.5)
CO2: 33 meq/L — AB (ref 19–32)
CREATININE: 0.92 mg/dL (ref 0.40–1.50)
Chloride: 99 mEq/L (ref 96–112)
GFR: 113.71 mL/min (ref >60.00)
Glucose, Bld: 407 mg/dL — ABNORMAL HIGH (ref 70–99)
Potassium: 4.7 mEq/L (ref 3.5–5.1)
Sodium: 136 mEq/L (ref 135–145)
Total Protein: 7.6 g/dL (ref 6.0–8.3)

## 2015-10-07 LAB — PSA: PSA: 0.15 ng/mL (ref 0.10–4.00)

## 2015-10-07 LAB — LIPID PANEL
CHOL/HDL RATIO: 2
CHOLESTEROL: 121 mg/dL (ref 0–200)
HDL: 66.7 mg/dL (ref >39.00)
LDL Cholesterol: 39 mg/dL (ref 0–99)
NonHDL: 54.21
TRIGLYCERIDES: 78 mg/dL (ref 0.0–149.0)
VLDL: 15.6 mg/dL (ref 0.0–40.0)

## 2015-10-07 LAB — HEMOGLOBIN A1C: Hgb A1c MFr Bld: 10.5 % — ABNORMAL HIGH (ref 4.6–6.5)

## 2015-10-07 NOTE — Telephone Encounter (Signed)
-----   Message from Ellamae Sia sent at 09/27/2015 11:58 AM EST ----- Regarding: Lab orders for Friday, 1.13.17 Patient is scheduled for CPX labs, please order future labs, Thanks , Karna Christmas

## 2015-10-14 ENCOUNTER — Encounter: Payer: Self-pay | Admitting: Family Medicine

## 2015-10-14 ENCOUNTER — Ambulatory Visit (INDEPENDENT_AMBULATORY_CARE_PROVIDER_SITE_OTHER): Payer: 59 | Admitting: Family Medicine

## 2015-10-14 VITALS — BP 138/88 | HR 77 | Temp 98.3°F | Ht 71.5 in | Wt 178.5 lb

## 2015-10-14 DIAGNOSIS — B351 Tinea unguium: Secondary | ICD-10-CM

## 2015-10-14 DIAGNOSIS — E119 Type 2 diabetes mellitus without complications: Secondary | ICD-10-CM

## 2015-10-14 DIAGNOSIS — E7800 Pure hypercholesterolemia, unspecified: Secondary | ICD-10-CM

## 2015-10-14 DIAGNOSIS — Z0000 Encounter for general adult medical examination without abnormal findings: Secondary | ICD-10-CM | POA: Diagnosis not present

## 2015-10-14 DIAGNOSIS — Z9884 Bariatric surgery status: Secondary | ICD-10-CM

## 2015-10-14 DIAGNOSIS — I10 Essential (primary) hypertension: Secondary | ICD-10-CM | POA: Diagnosis not present

## 2015-10-14 DIAGNOSIS — Z9889 Other specified postprocedural states: Secondary | ICD-10-CM

## 2015-10-14 MED ORDER — TERBINAFINE HCL 250 MG PO TABS: 250.0000 mg | ORAL_TABLET | Freq: Every day | ORAL | Status: DC

## 2015-10-14 NOTE — Assessment & Plan Note (Signed)
Treat with terbinafine x 3 months. Then follow up.

## 2015-10-14 NOTE — Progress Notes (Signed)
Pre visit review using our clinic review tool, if applicable. No additional management support is needed unless otherwise documented below in the visit note. 

## 2015-10-14 NOTE — Patient Instructions (Addendum)
Ask mother if she was positive for genetic testing for BRCA1 and BRCA2.  Get back on track with low carb diet, decrease alcohol.  Start regular exercise 3-5 days a week. Start back checking blood sugar daily.. Goal Fasting blood sugar in morning < 120.   Start toenail fungas treatment course.

## 2015-10-14 NOTE — Progress Notes (Signed)
The patient is here for annual wellness exam and preventative care.  S/P bariatric surgery.  Wt Readings from Last 3 Encounters:  10/14/15 178 lb 8 oz (80.967 kg)  07/19/15 183 lb 8 oz (83.235 kg)  04/07/15 200 lb 8 oz (90.946 kg)    Diabetes:  Now on metformin alone. Had breakfast prior to the lab... big breakfast. Pancakes with [employer]. CBG was 407!! He has been having more alcohol lately.  Lab Results  Component Value Date   HGBA1C 10.5* 10/07/2015  Using medications without difficulties: Yes. Hypoglycemic episodes:Yes Hyperglycemic episodes:No Feet problems:None Blood Sugars averaging: not checking regualrly. eye exam within last year: none  Hypertension: At goal  On no med since weight loss BP Readings from Last 3 Encounters:  10/14/15 138/88  07/19/15 130/82  04/07/15 148/88  Using medication without problems or lightheadedness:  Chest pain with exertion: None Edema:None Short of breath:None Average home BPs:?   Elevated Cholesterol:  LDL at goal < 100 on no med. Lab Results  Component Value Date   CHOL 121 10/07/2015   HDL 66.70 10/07/2015   LDLCALC 39 10/07/2015   TRIG 78.0 10/07/2015   CHOLHDL 2 10/07/2015  no current exercise. Poor diet.    Review of Systems  Constitutional: Negative for fever, fatigue and unexpected weight change.  HENT: Negative for ear pain, congestion, sore throat, rhinorrhea, trouble swallowing and postnasal drip.  Eyes: Negative for pain.  Respiratory: Negative for cough, shortness of breath and wheezing.  Cardiovascular: Negative for chest pain, palpitations and leg swelling.  Gastrointestinal: Negative for nausea, abdominal pain, diarrhea, constipation and blood in stool.  Genitourinary: Negative for dysuria, urgency, hematuria, discharge, penile swelling, scrotal swelling, difficulty urinating, penile pain and testicular pain.  Skin: Negative for rash.  Neurological: Negative for syncope, weakness, light-headedness,  numbness and headaches.  Psychiatric/Behavioral: Negative for behavioral problems and dysphoric mood. The patient is not nervous/anxious.       Objective:   Physical Exam  Constitutional: He appears well-developed and well-nourished. Non-toxic appearance. He does not appear ill. No distress.  HENT:  Head: Normocephalic and atraumatic.  Right Ear: Hearing, tympanic membrane, external ear and ear canal normal.  Left Ear: Hearing, tympanic membrane, external ear and ear canal normal.  Nose: Nose normal.  Mouth/Throat: Uvula is midline, oropharynx is clear and moist and mucous membranes are normal.  Eyes: Conjunctivae, EOM and lids are normal. Pupils are equal, round, and reactive to light. No foreign bodies found.  Neck: Trachea normal, normal range of motion and phonation normal. Neck supple. Carotid bruit is not present. No mass and no thyromegaly present.  Cardiovascular: Normal rate, regular rhythm, S1 normal, S2 normal, intact distal pulses and normal pulses. Exam reveals no gallop.  No murmur heard. Pulmonary/Chest: Breath sounds normal. He has no wheezes. He has no rhonchi. He has no rales.  Abdominal: Soft. Normal appearance and bowel sounds are normal. There is no hepatosplenomegaly. There is no tenderness. There is no rebound, no guarding and no CVA tenderness. No hernia. Hernia confirmed negative in the right inguinal area and confirmed negative in the left inguinal area.  Genitourinary: Testes normal and penis normal. Right testis shows no mass and no tenderness. Left testis shows no mass and no tenderness. No paraphimosis or penile tenderness.  Lymphadenopathy:   He has no cervical adenopathy.   Right: No inguinal adenopathy present.   Left: No inguinal adenopathy present.  Neurological: He is alert. He has normal strength and normal reflexes. No cranial nerve deficit  or sensory deficit. Gait normal.  Skin: Skin is warm, dry and intact. No rash noted.   Psychiatric: He has a normal mood and affect. His speech is normal and behavior is normal. Judgment normal.      Diabetic foot exam: Stubbed  great toe on right foot, now loose, no pain, no discharge, no redness.  Onchymycocis on toes bilaterally, subungual debris. No skin breakdown No calluses  Normal DP pulses Normal sensation to light touch and monofilament     Assessment & Plan:  Complete Physical Exam: The patient's preventative maintenance and recommended screening tests for an annual wellness exam were reviewed in full today. Brought up to date unless services declined.  Counselled on the importance of diet, exercise, and its role in overall health and mortality. The patient's FH and SH was reviewed, including their home life, tobacco status, and drug and alcohol status.        Vaccines:Uptodate  No early family history of prostate.  Father with colon cancer dx age 67 he has no blood in stool..  sstrt screening at age 58. Strong family of breast cancer even in males. Lab Results  Component Value Date   PSA 0.15 10/07/2015   Refused HIV screen.

## 2015-10-14 NOTE — Assessment & Plan Note (Signed)
Well controlled. Continue current medication.  

## 2015-10-14 NOTE — Assessment & Plan Note (Signed)
Very poor contol with poor diet despite weight loss.  Increase lean protein in diet and decrease carbs.  Recommended increase of metformin.. Pt refused for now. Will get back on track and recheck in 3 months.

## 2015-10-14 NOTE — Assessment & Plan Note (Signed)
Well controlled on no med. 

## 2015-10-25 ENCOUNTER — Telehealth: Payer: Self-pay

## 2015-10-25 MED ORDER — METFORMIN HCL ER 500 MG PO TB24: 1500.0000 mg | ORAL_TABLET | Freq: Every day | ORAL | Status: DC

## 2015-10-25 NOTE — Telephone Encounter (Signed)
Left detailed instructions on Philip Ponce's phone about increasing his metformin as instructed by Dr. [name].  New prescription sent into his pharmacy.

## 2015-10-25 NOTE — Telephone Encounter (Signed)
HAve him increase up to 3 tabs pre day, if FBS  Still > 120, increase further to 4 tabs daily after 1 week. Refill and change med list as needed.

## 2015-10-25 NOTE — Telephone Encounter (Signed)
Pt left v/m calling with BS readings; 10/24/15 FBS was 250 and last night at hs BS was 300. These are averages for BS readings. Pt had been taking one pill daily of ? Metformin; now pt is taking 2 pills a day. Unable to reach pt by phone to verify the med pt increased was metformin.Please advise.

## 2015-11-03 ENCOUNTER — Telehealth: Payer: Self-pay | Admitting: Family Medicine

## 2015-11-03 DIAGNOSIS — B351 Tinea unguium: Secondary | ICD-10-CM

## 2015-11-03 DIAGNOSIS — S99929A Unspecified injury of unspecified foot, initial encounter: Secondary | ICD-10-CM

## 2015-11-03 NOTE — Telephone Encounter (Signed)
Pt would like to get a referral to podiatrist for his toenail that is about to come off He would like to go to [place]

## 2015-11-15 ENCOUNTER — Ambulatory Visit (INDEPENDENT_AMBULATORY_CARE_PROVIDER_SITE_OTHER): Payer: 59 | Admitting: Sports Medicine

## 2015-11-15 ENCOUNTER — Encounter: Payer: Self-pay | Admitting: Sports Medicine

## 2015-11-15 DIAGNOSIS — M79676 Pain in unspecified toe(s): Secondary | ICD-10-CM | POA: Diagnosis not present

## 2015-11-15 DIAGNOSIS — E119 Type 2 diabetes mellitus without complications: Secondary | ICD-10-CM | POA: Diagnosis not present

## 2015-11-15 DIAGNOSIS — B351 Tinea unguium: Secondary | ICD-10-CM | POA: Diagnosis not present

## 2015-11-15 NOTE — Progress Notes (Signed)
Patient ID: Philip Ponce, male   DOB: 1971/03/23, 45 y.o.   MRN: [id number] Subjective: Philip Ponce is a 45 y.o. male patient with history of type 1 diabetes who presents to office today complaining of long, painful nails  while ambulating in shoes; unable to trim. Patient states that the glucose reading this morning was 240 mg/dl. Patient denies any new changes in medication or new problems. Patient denies any new cramping, numbness, burning or tingling in the legs.  Patient Active Problem List   Diagnosis Date Noted  . Toenail fungus 10/14/2015  . H/O gastric bypass 04/07/2015  . RBBB 04/07/2015  . Abnormal finding on thallium stress test 09/25/2013  . Congestive heart failure ([place]) 09/25/2013  . Elevated troponin I level 09/25/2013  . Diabetes mellitus ([place]) 09/24/2013  . Hypomagnesemia 09/24/2013  . Block, bundle branch, left 09/24/2013  . Adiposity 09/24/2013  . Obstructive apnea 09/24/2013  . GERD 09/29/2010  . 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 Essential hypertension, benign 07/09/2007  . HYPERCHOLESTEROLEMIA, PURE 06/12/2007  . Diabetes mellitus with no complication ([place]) 0000000   Current Outpatient Prescriptions on File Prior to Visit  Medication Sig Dispense Refill  . aspirin 81 MG tablet Take 81 mg by mouth daily.    . calcium carbonate (OS-CAL) 600 MG TABS tablet Take 600 mg by mouth 2 (two) times daily with a meal.    . Docusate Sodium (STOOL SOFTENER) 100 MG capsule Take 100 mg by mouth daily.    . metFORMIN (GLUCOPHAGE-XR) 500 MG 24 hr tablet Take 3 tablets (1,500 mg total) by mouth daily with breakfast. 90 tablet 0  . Multiple Vitamins-Minerals (MENS MULTI VITAMIN & MINERAL PO) Take 2 tablets by mouth daily. Men's Vita Fusion    . Omega-3 Fatty Acids (FISH OIL) 1200 MG CAPS Take 1 capsule by mouth daily.    Marland Kitchen terbinafine (LAMISIL) 250 MG tablet Take 1 tablet (250 mg total) by mouth daily. 30 tablet 2   No current facility-administered  medications on file prior to visit.   No Known Allergies  Objective: General: Patient is awake, alert, and oriented x 3 and in no acute distress.  Integument: Skin is warm, dry and supple bilateral. Nails are tender, long, thickened and  dystrophic with subungual debris, consistent with onychomycosis, 1-5 bilateral, right hallux nail is partially detached secondary to snagging nail. No signs of infection. No open lesions or preulcerative lesions present bilateral. Remaining integument unremarkable.  Vasculature:  Dorsalis Pedis pulse 2/4 bilateral. Posterior Tibial pulse  2/4 bilateral.  Capillary fill time <3 sec 1-5 bilateral. Positive hair growth to the level of the digits. Temperature gradient within normal limits. No varicosities present bilateral. No edema present bilateral.   Neurology: The patient has intact sensation measured with a 5.07/10g Semmes Weinstein Monofilament at all pedal sites bilateral . Vibratory sensation intact bilateral with tuning fork. No Babinski sign present bilateral.   Musculoskeletal: No gross pedal deformities noted bilateral. Muscular strength 5/5 in all lower extremity muscular groups bilateral without pain or limitation on range of motion . No tenderness with calf compression bilateral.  Assessment and Plan: Problem List Items Addressed This Visit    None    Visit Diagnoses    Dermatophytosis of nail    -  Primary    on Lamisil     Pain of toe, unspecified laterality        Diabetes mellitus without complication ([place])          -Examined patient. -Discussed  and educated patient on diabetic foot care, especially with  regards to the vascular, neurological and musculoskeletal systems.  -Stressed the importance of good glycemic control and the detriment of not  controlling glucose levels in relation to the foot. -Mechanically debrided all nails 1-5 bilateral using sterile nail nipper and filed with dremel without incident  -Cont with Lamisil as Rx  by PCP -Answered all patient questions -Patient to return as needed or in 3 months for at risk foot care -Patient advised to call the office if any problems or questions arise in the meantime.  Philip Ponce, DPM

## 2016-01-03 ENCOUNTER — Telehealth: Payer: Self-pay | Admitting: Family Medicine

## 2016-01-03 ENCOUNTER — Other Ambulatory Visit (INDEPENDENT_AMBULATORY_CARE_PROVIDER_SITE_OTHER): Payer: 59

## 2016-01-03 DIAGNOSIS — E119 Type 2 diabetes mellitus without complications: Secondary | ICD-10-CM

## 2016-01-03 LAB — COMPREHENSIVE METABOLIC PANEL
ALBUMIN: 3.8 g/dL (ref 3.5–5.2)
ALT: 15 U/L (ref 0–53)
AST: 13 U/L (ref 0–37)
Alkaline Phosphatase: 70 U/L (ref 39–117)
BUN: 16 mg/dL (ref 6–23)
CHLORIDE: 100 meq/L (ref 96–112)
CO2: 33 meq/L — AB (ref 19–32)
CREATININE: 0.9 mg/dL (ref 0.40–1.50)
Calcium: 9.3 mg/dL (ref 8.4–10.5)
GFR: 116.51 mL/min (ref >60.00)
GLUCOSE: 313 mg/dL — AB (ref 70–99)
POTASSIUM: 4.8 meq/L (ref 3.5–5.1)
SODIUM: 137 meq/L (ref 135–145)
Total Bilirubin: 0.6 mg/dL (ref 0.2–1.2)
Total Protein: 7.2 g/dL (ref 6.0–8.3)

## 2016-01-03 LAB — HEMOGLOBIN A1C: HEMOGLOBIN A1C: 10.6 % — AB (ref 4.6–6.5)

## 2016-01-03 NOTE — Telephone Encounter (Signed)
-----   Message from Marchia Bond sent at 12/27/2015  8:46 AM EDT ----- Regarding: 3 mo f/u labs Tues 4/11, need orders. Thanks! :-) Please order future 3 mo f/u labs for pt's upcoming lab appt. Thanks Aniceto Boss

## 2016-01-06 ENCOUNTER — Other Ambulatory Visit: Payer: 59

## 2016-01-13 ENCOUNTER — Encounter: Payer: Self-pay | Admitting: Family Medicine

## 2016-01-13 ENCOUNTER — Ambulatory Visit (INDEPENDENT_AMBULATORY_CARE_PROVIDER_SITE_OTHER): Payer: 59 | Admitting: Family Medicine

## 2016-01-13 VITALS — BP 138/82 | HR 83 | Temp 98.5°F | Ht 71.5 in | Wt 180.0 lb

## 2016-01-13 DIAGNOSIS — E119 Type 2 diabetes mellitus without complications: Secondary | ICD-10-CM

## 2016-01-13 DIAGNOSIS — M25512 Pain in left shoulder: Secondary | ICD-10-CM | POA: Diagnosis not present

## 2016-01-13 DIAGNOSIS — I10 Essential (primary) hypertension: Secondary | ICD-10-CM | POA: Diagnosis not present

## 2016-01-13 DIAGNOSIS — E7800 Pure hypercholesterolemia, unspecified: Secondary | ICD-10-CM

## 2016-01-13 LAB — HM DIABETES FOOT EXAM

## 2016-01-13 MED ORDER — GLIPIZIDE ER 10 MG PO TB24: 10.0000 mg | ORAL_TABLET | Freq: Every day | ORAL | Status: DC

## 2016-01-13 MED ORDER — METFORMIN HCL ER 500 MG PO TB24: 1000.0000 mg | ORAL_TABLET | Freq: Every day | ORAL | Status: DC

## 2016-01-13 NOTE — Progress Notes (Signed)
45 year old male presents for follow up DM.  He has also noted acute left shoulder pain. He works as Curator and uses paint gun , holding  All day with left arm. No specific injury.  Pain with raising above head, int and ext rotation.  No fall.  No neck pain. No numbness, no weakness. Has not tried anything for pain except heat.  Diabetes: Recent worsening of diabetes control despite gastric bypass surgery and pt on now on metformin 1000 mg daily ALONE. He felt possible SE to higher dose. No longer on insulin. Lab Results  Component Value Date   HGBA1C 10.6* 01/03/2016  Using medications without difficulties: None Hypoglycemic episodes: None Hyperglycemic episodes:None Feet problems:none Blood Sugars averaging: not checking. eye exam within last year: yes.. Diabetic retinopathy improved.  Moderate diet: no longer eating sweets, bread, eats a lot of protein. Exercise:  daily  He reports no clear reason sugar is up other than on new med terbinafine.  Wt Readings from Last 3 Encounters:  01/13/16 180 lb (81.647 kg)  10/14/15 178 lb 8 oz (80.967 kg)  07/19/15 183 lb 8 oz (83.235 kg)   Hypertension:  BP Readings from Last 3 Encounters:  01/13/16 138/82  10/14/15 138/88  07/19/15 130/82   Now on no med. Using medication without problems or lightheadedness:  Chest pain with exertion: Edema: Short of breath: Average home BPs: Other issues:   At last check cholesterol at goal on no medication. Lab Results  Component Value Date   CHOL 121 10/07/2015   HDL 66.70 10/07/2015   LDLCALC 39 10/07/2015   TRIG 78.0 10/07/2015   CHOLHDL 2 10/07/2015   Review of Systems  Constitutional: Negative for fever and fatigue.  HENT: Negative for ear pain.  Eyes: Negative for pain.  Respiratory: Negative for cough and shortness of breath.  Cardiovascular: Negative for chest pain.  Gastrointestinal: Negative for abdominal pain.       Objective:   Physical Exam   Constitutional: Vital signs are normal. He appears well-developed and well-nourished.  HENT:  Head: Normocephalic.  Right Ear: Hearing normal.  Left Ear: Hearing normal.  Nose: Nose normal.  Mouth/Throat: Oropharynx is clear and moist and mucous membranes are normal.  Neck: Trachea normal. Carotid bruit is not present. No thyroid mass and no thyromegaly present.  Cardiovascular: Normal rate, regular rhythm and normal pulses. Exam reveals no gallop, no distant heart sounds and no friction rub.  No murmur heard. No peripheral edema  Pulmonary/Chest: Effort normal and breath sounds normal. No respiratory distress.  Skin: Skin is warm, dry and intact. No rash noted.  Psychiatric: He has a normal mood and affect. His speech is normal and behavior is normal. Thought content normal.    Diabetic foot exam: Normal inspection No skin breakdown No calluses  Normal DP pulses Normal sensation to light touch and monofilament Nails normal

## 2016-01-13 NOTE — Patient Instructions (Addendum)
Stop terbinafine. Start glipizide in addition to current dose of metformin. Keep working on healthy low sugar diet an exercise. Follow blood sugars daily fasting in morning .Marland Kitchen Goal < 120.  Call in 2 weeks with blood sugars. Start diclofenac twice daily for inflammation, start home PT for shoulder 2-3 times daily if able.  Call if not improving in 2 weeks.

## 2016-01-13 NOTE — Progress Notes (Signed)
Pre visit review using our clinic review tool, if applicable. No additional management support is needed unless otherwise documented below in the visit note. 

## 2016-01-13 NOTE — Assessment & Plan Note (Signed)
Poor control. Cannot increase metformin.  He thinks sugar started to increase with terbinafine.. Stop. Add glipizide. Follow up with phone call in 2 weeks and labs in 3 months.

## 2016-01-13 NOTE — Assessment & Plan Note (Signed)
Well controlled on no med. 

## 2016-01-13 NOTE — Assessment & Plan Note (Signed)
Stable control last check. 

## 2016-01-13 NOTE — Assessment & Plan Note (Signed)
Likely rotator cuff tendonitiis.  Treat with ice, ROM exercises and antiinflammatory.

## 2016-01-17 ENCOUNTER — Telehealth: Payer: Self-pay

## 2016-01-17 NOTE — Telephone Encounter (Addendum)
Pt left /vm requesting cb about pain medicine and test strips; left v/m requesting cb; pt was seen on 01/13/16. Pt left note requesting pain med to [facility] [place]. Pt was seen 01/13/16 and in note advised pt to take diclofenac bid for inflammation. I used the diclofenac 75 mg that was on hx med list and sent diclofenac 75 mg taking one tab bid # 30 x 0. And onetouch ultra test strips to [facility] [place]. Pt will cb with update of condition. FYI to Dr [name].

## 2016-01-19 MED ORDER — GLUCOSE BLOOD VI STRP: ORAL_STRIP | Status: DC

## 2016-01-19 MED ORDER — DICLOFENAC SODIUM 75 MG PO TBEC: 75.0000 mg | DELAYED_RELEASE_TABLET | Freq: Two times a day (BID) | ORAL | Status: DC

## 2016-01-28 ENCOUNTER — Other Ambulatory Visit: Payer: Self-pay | Admitting: Family Medicine

## 2016-01-31 ENCOUNTER — Encounter: Payer: Self-pay | Admitting: Emergency Medicine

## 2016-01-31 ENCOUNTER — Emergency Department
Admission: EM | Admit: 2016-01-31 | Discharge: 2016-01-31 | Disposition: A | Discharge status: Home / Self Care | Payer: 59 | Attending: Emergency Medicine | Admitting: Emergency Medicine

## 2016-01-31 ENCOUNTER — Emergency Department: Payer: 59

## 2016-01-31 ENCOUNTER — Other Ambulatory Visit: Payer: Self-pay | Admitting: Family Medicine

## 2016-01-31 DIAGNOSIS — I110 Hypertensive heart disease with heart failure: Secondary | ICD-10-CM | POA: Insufficient documentation

## 2016-01-31 DIAGNOSIS — Z79899 Other long term (current) drug therapy: Secondary | ICD-10-CM | POA: Diagnosis not present

## 2016-01-31 DIAGNOSIS — Z791 Long term (current) use of non-steroidal anti-inflammatories (NSAID): Secondary | ICD-10-CM | POA: Insufficient documentation

## 2016-01-31 DIAGNOSIS — I509 Heart failure, unspecified: Secondary | ICD-10-CM | POA: Insufficient documentation

## 2016-01-31 DIAGNOSIS — E109 Type 1 diabetes mellitus without complications: Secondary | ICD-10-CM | POA: Diagnosis not present

## 2016-01-31 DIAGNOSIS — Z7984 Long term (current) use of oral hypoglycemic drugs: Secondary | ICD-10-CM | POA: Insufficient documentation

## 2016-01-31 DIAGNOSIS — Z7982 Long term (current) use of aspirin: Secondary | ICD-10-CM | POA: Insufficient documentation

## 2016-01-31 DIAGNOSIS — R079 Chest pain, unspecified: Secondary | ICD-10-CM

## 2016-01-31 DIAGNOSIS — R0789 Other chest pain: Secondary | ICD-10-CM | POA: Insufficient documentation

## 2016-01-31 LAB — BASIC METABOLIC PANEL
ANION GAP: 7 (ref 5–15)
BUN: 18 mg/dL (ref 6–20)
CO2: 27 mmol/L (ref 22–32)
Calcium: 8.7 mg/dL — ABNORMAL LOW (ref 8.9–10.3)
Chloride: 102 mmol/L (ref 101–111)
Creatinine, Ser: 0.99 mg/dL (ref 0.61–1.24)
GFR calc Af Amer: >60 mL/min (ref >60)
GLUCOSE: 358 mg/dL — AB (ref 65–99)
POTASSIUM: 3.9 mmol/L (ref 3.5–5.1)
Sodium: 136 mmol/L (ref 135–145)

## 2016-01-31 LAB — TROPONIN I: Troponin I: <0.03 ng/mL (ref <0.031)

## 2016-01-31 LAB — CBC
HEMATOCRIT: 34.5 % — AB (ref 40.0–52.0)
HEMOGLOBIN: 11.3 g/dL — AB (ref 13.0–18.0)
MCH: 28.5 pg (ref 26.0–34.0)
MCHC: 32.8 g/dL (ref 32.0–36.0)
MCV: 86.9 fL (ref 80.0–100.0)
Platelets: 239 10*3/uL (ref 150–440)
RBC: 3.97 MIL/uL — ABNORMAL LOW (ref 4.40–5.90)
RDW: 12.9 % (ref 11.5–14.5)
WBC: 5.4 10*3/uL (ref 3.8–10.6)

## 2016-01-31 MED ORDER — ASPIRIN 81 MG PO CHEW
324.0000 mg | CHEWABLE_TABLET | Freq: Once | ORAL | Status: AC
  Administered 2016-01-31: 324 mg via ORAL
  Filled 2016-01-31: qty 4

## 2016-01-31 MED ORDER — ASPIRIN 81 MG PO CHEW
CHEWABLE_TABLET | ORAL | Status: AC
  Filled 2016-01-31: qty 1

## 2016-01-31 NOTE — ED Provider Notes (Signed)
[facility] Emergency Department Provider Note  ____________________________________________  Time seen: Approximately 6:40 PM  I have reviewed the triage vital signs and the nursing notes.   HISTORY  Chief Complaint Chest Pain    HPI Philip Ponce is a 45 y.o. male with a past medical history includes former morbid obesity but now a healthy body habitus status post gastric bypass several years ago, diagnosis of CHF shortly afterhaving his gastric bypass but which is improved over time.  He does not currently see a cardiologist although he did see one for the CHF several years ago.  He presents after gradual onset of central chest pain and pressure today that he reports was severe but is now gone.  He was at work at first he thought it was musculoskeletal but then it took his breath away.  Nothing in particular made it better and nothing made it worse.  Even though it is completely resolved he is worried about the onset of the symptoms because he has not had similar symptoms in the past, and the closest sensation was when he was being treated for CHF.  He denies fever/chills, sweating, nausea, vomiting, diarrhea, abdominal pain.  He felt a little bit short of breath when he had the centralized chest pain but that has completely resolved as well.   Past Medical History  Diagnosis Date  . Diabetes mellitus type I ([place])   . Hypertension   . Slipped intervertebral disc     L4 L5    Patient Active Problem List   Diagnosis Date Noted  . Shoulder pain, left 01/13/2016  . Toenail fungus 10/14/2015  . H/O gastric bypass 04/07/2015  . RBBB 04/07/2015  . Abnormal finding on thallium stress test 09/25/2013  . Congestive heart failure ([place]) 09/25/2013  . Elevated troponin I level 09/25/2013  . Diabetes mellitus ([place]) 09/24/2013  . Hypomagnesemia 09/24/2013  . Block, bundle branch, left 09/24/2013  . Adiposity 09/24/2013  . Obstructive apnea 09/24/2013  .  GERD 09/29/2010  . 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 Essential hypertension, benign 07/09/2007  . HYPERCHOLESTEROLEMIA, PURE 06/12/2007  . Diabetes mellitus with no complication ([place]) 0000000    Past Surgical History  Procedure Laterality Date  . Gastric bypass      Current Outpatient Rx  Name  Route  Sig  Dispense  Refill  . aspirin 81 MG tablet   Oral   Take 81 mg by mouth daily.         . calcium carbonate (OS-CAL) 600 MG TABS tablet   Oral   Take 600 mg by mouth 2 (two) times daily with a meal.         . diclofenac (VOLTAREN) 75 MG EC tablet   Oral   Take 1 tablet (75 mg total) by mouth 2 (two) times daily.   30 tablet   0   . Docusate Sodium (STOOL SOFTENER) 100 MG capsule   Oral   Take 100 mg by mouth daily.         Marland Kitchen glipiZIDE (GLUCOTROL XL) 10 MG 24 hr tablet   Oral   Take 1 tablet (10 mg total) by mouth daily with breakfast.   90 tablet   3   . glucose blood (ONE TOUCH ULTRA TEST) test strip      Check blood sugar once daily and as instructed. Dx E11.9   100 each   1   . metFORMIN (GLUCOPHAGE-XR) 500 MG 24 hr tablet   Oral  Take 2 tablets (1,000 mg total) by mouth daily with breakfast.   180 tablet   3   . Multiple Vitamins-Minerals (MENS MULTI VITAMIN & MINERAL PO)   Oral   Take 2 tablets by mouth daily. Men's Vita Fusion         . Omega-3 Fatty Acids (FISH OIL) 1200 MG CAPS   Oral   Take 1 capsule by mouth daily.           Allergies Review of patient's allergies indicates no known allergies.  Family History  Problem Relation Age of Onset  . Diabetes Mother   . Hypertension Mother   . Cancer Mother     breast  . Diabetes Father   . Hypertension Father   . Cancer Father     colon  . Cancer Maternal Grandfather     prostate  . Stroke Maternal Grandfather     CVA    Social History Social History  Substance Use Topics  . Smoking status: Never Smoker   . Smokeless tobacco: Never Used  . Alcohol Use: No     Review of Systems Constitutional: No fever/chills Eyes: No visual changes. ENT: No sore throat. Cardiovascular: Central chest pain and pressure Respiratory: Denies shortness of breath. Gastrointestinal: No abdominal pain.  No nausea, no vomiting.  No diarrhea.  No constipation. Genitourinary: Negative for dysuria. Musculoskeletal: Negative for back pain. Skin: Negative for rash. Neurological: Negative for headaches, focal weakness or numbness.  10-point ROS otherwise negative.  ____________________________________________   PHYSICAL EXAM:  VITAL SIGNS: ED Triage Vitals  Enc Vitals Group     BP 01/31/16 1728 151/82 mmHg     Pulse Rate 01/31/16 1728 92     Resp 01/31/16 1728 20     Temp 01/31/16 1728 98.1 F (36.7 C)     Temp Source 01/31/16 1728 Oral     SpO2 01/31/16 1728 100 %     Weight 01/31/16 1728 180 lb (81.647 kg)     Height 01/31/16 1728 6\' 1"  (1.854 m)     Head Cir --      Peak Flow --      Pain Score 01/31/16 1728 4     Pain Loc --      Pain Edu? --      Excl. in [place]? --     Constitutional: Alert and oriented. Well appearing and in no acute distress. Eyes: Conjunctivae are normal. PERRL. EOMI. Head: Atraumatic. Nose: No congestion/rhinnorhea. Mouth/Throat: Mucous membranes are moist.  Oropharynx non-erythematous. Neck: No stridor.  No meningeal signs.   Cardiovascular: Normal rate, regular rhythm. Good peripheral circulation. Grossly normal heart sounds.   Respiratory: Normal respiratory effort.  No retractions. Lungs CTAB. Gastrointestinal: Soft and nontender. No distention.  Musculoskeletal: No lower extremity tenderness nor edema. No gross deformities of extremities. Neurologic:  Normal speech and language. No gross focal neurologic deficits are appreciated.  Skin:  Skin is warm, dry and intact. No rash noted.   ____________________________________________   LABS (all labs ordered are listed, but only abnormal results are displayed)  Labs  Reviewed  BASIC METABOLIC PANEL - Abnormal; Notable for the following:    Glucose, Bld 358 (*)    Calcium 8.7 (*)    All other components within normal limits  CBC - Abnormal; Notable for the following:    RBC 3.97 (*)    Hemoglobin 11.3 (*)    HCT 34.5 (*)    All other components within normal limits  TROPONIN I  TROPONIN I   ____________________________________________  EKG  ED ECG REPORT I, Nieves Chapa, the attending physician, personally viewed and interpreted this ECG.  Date: 01/31/2016 EKG Time: 17:20 Rate: 92 Rhythm: normal sinus rhythm QRS Axis: normal Intervals: normal ST/T Wave abnormalities: normal Conduction Disturbances: none Narrative Interpretation: unremarkable  ____________________________________________  RADIOLOGY   Dg Chest 2 View  01/31/2016  CLINICAL DATA:  centralized chest pain since 1500; reports feels like pressure. Pt also reports shortness of breath. Pt states he has recently been "on new medications" and took a B12 shot and energy drink mix today. Never a smoker. Reports hx of heart cath x 3 years ago. shielded EXAM: CHEST - 2 VIEW COMPARISON:  08/04/2012 FINDINGS: Lungs are clear. Heart size and mediastinal contours are within normal limits. No effusion. Visualized skeletal structures are unremarkable. IMPRESSION: No acute cardiopulmonary disease. Electronically Signed   By: Lucrezia Europe M.D.   On: 01/31/2016 17:57    ____________________________________________   PROCEDURES  Procedure(s) performed: None  Critical Care performed: No ____________________________________________   INITIAL IMPRESSION / ASSESSMENT AND PLAN / ED COURSE  Pertinent labs & imaging results that were available during my care of the patient were reviewed by me and considered in my medical decision making (see chart for details).  PERC negative.  HEART score 3 (low risk).  2 troponins are negative.  Normal mediastinum, no neurological symptoms, no suggestion of  aortic pathology.  The patient heart has a cardiologist, but we are not sure who at this point.  I will refer him to Dr. Laurelyn Sickle and less he can get into see his regular cardiologist within the next 48 hours.  I gave my usual and customary return precautions.      ____________________________________________  FINAL CLINICAL IMPRESSION(S) / ED DIAGNOSES  Final diagnoses:  Chest pain, unspecified chest pain type     MEDICATIONS GIVEN DURING THIS VISIT:  Medications  aspirin 81 MG chewable tablet (not administered)  aspirin chewable tablet 324 mg (324 mg Oral Given 01/31/16 1930)     NEW OUTPATIENT MEDICATIONS STARTED DURING THIS VISIT:  New Prescriptions   No medications on file      Note:  This document was prepared using Dragon voice recognition software and may include unintentional dictation errors.   Hinda Kehr, MD 01/31/16 2209

## 2016-01-31 NOTE — ED Notes (Signed)
Patient transported to X-ray 

## 2016-01-31 NOTE — ED Notes (Addendum)
Pt reports pain feels like when he was dx with CHF. Pt denies having that any longer but reports the chest pain is similar. PT denies swelling or weight gain recently.

## 2016-01-31 NOTE — ED Notes (Signed)
Pt here with centralized chest pain since 1500; reports feels like pressure. Pt also reports shortness of breath. Pt alert and oriented in triage, skin warm and dry.

## 2016-01-31 NOTE — Discharge Instructions (Signed)

## 2016-02-03 ENCOUNTER — Telehealth: Payer: Self-pay

## 2016-02-03 NOTE — Telephone Encounter (Signed)
Philip Ponce called about refill for metformin; spoke with Ebony Hail at [address] and metformin ready for pick up. Philip Ponce voiced understanding.

## 2016-02-14 ENCOUNTER — Ambulatory Visit: Payer: 59 | Admitting: Sports Medicine

## 2016-02-23 ENCOUNTER — Other Ambulatory Visit: Payer: Self-pay | Admitting: Family Medicine

## 2016-02-23 NOTE — Telephone Encounter (Signed)
Last office visit 01/13/2016.  Last refilled 01/19/2016 for #30 with no refills.  Ok to refill?

## 2016-02-28 ENCOUNTER — Telehealth: Payer: Self-pay | Admitting: *Deleted

## 2016-02-28 MED ORDER — DICLOFENAC SODIUM 75 MG PO TBEC: 75.0000 mg | DELAYED_RELEASE_TABLET | Freq: Two times a day (BID) | ORAL | Status: DC

## 2016-02-28 NOTE — Telephone Encounter (Signed)
Patient called stating that he was given anti-inflammory for his shoulder and has been off of it for 2 days now. Patient stated that he does not have any refills on it. Patient stated that he was doing better when he was on the medication but now his shoulder is hurting again and it hurts down into his back. Patient stated that he would like a refill to help him with the pain until he can get someone to relieve him at work which should be soon and he will be doing a different job. Pharmacy [facility]

## 2016-02-28 NOTE — Telephone Encounter (Signed)
Lattie Haw notified as instructed by telephone. Refill on Diclofenac sent into [facility].

## 2016-02-28 NOTE — Telephone Encounter (Signed)
Okay to refill diclofenac 360, 1 refill. If pain remains after this he needs to be re-evaluated.

## 2016-02-28 NOTE — Telephone Encounter (Signed)
Patient called back and said he would be at work.  He asked for his wife,Lisa,to be called when rx is ready.  Her number is [phone].

## 2016-04-07 ENCOUNTER — Ambulatory Visit
Admission: EM | Admit: 2016-04-07 | Discharge: 2016-04-07 | Disposition: A | Discharge status: Home / Self Care | Payer: 59 | Attending: Internal Medicine | Admitting: Internal Medicine

## 2016-04-07 DIAGNOSIS — Q386 Other congenital malformations of mouth: Secondary | ICD-10-CM

## 2016-04-07 LAB — URINALYSIS COMPLETE WITH MICROSCOPIC (ARMC ONLY)
BILIRUBIN URINE: NEGATIVE
Bacteria, UA: NONE SEEN
GLUCOSE, UA: 100 mg/dL — AB
Hgb urine dipstick: NEGATIVE
KETONES UR: NEGATIVE mg/dL
Leukocytes, UA: NEGATIVE
Nitrite: NEGATIVE
RBC / HPF: NONE SEEN RBC/hpf (ref 0–5)
SPECIFIC GRAVITY, URINE: 1.025 (ref 1.005–1.030)
pH: 5 (ref 5.0–8.0)

## 2016-04-07 NOTE — ED Notes (Signed)
Patient complains of a white flaky areas in his pubic hair. He first noticed it a couple weeks ago. And now his wife is starting to have some green discharge. Denies any pain.

## 2016-04-07 NOTE — ED Provider Notes (Signed)
CSN: [id number]     Arrival date & time 04/07/16  73 History   First MD Initiated Contact with Patient 04/07/16 1341     Chief Complaint  Patient presents with  . Personal Problem   HPI  45 year old gentleman who presents today at the request of his wife. Wife has a greenish vaginal discharge and has had some vomiting today. Patient has had some difficulty in the last days to weeks with some kind of whitish mattering or bumps or skin process of the posterior scrotum. He says this has largely resolved since he started washing the area with hydrogen peroxide. He denies urethral discharge; no dysuria, no scrotal pain/swelling, no rash. No abdominal/pelvic discomfort. No change in bowel habits. He has in the last 24 hours had nasal congestion, rhinorrhea. He is managing this with OTC Dimetapp and Tussin.  No fever. Not coughing. A coworker had similar symptoms.  Past Medical History  Diagnosis Date  . Diabetes mellitus type I ([place])   . Slipped intervertebral disc     L4 L5   Past Surgical History  Procedure Laterality Date  . Gastric bypass     Family History  Problem Relation Age of Onset  . Diabetes Mother   . Hypertension Mother   . Cancer Mother     breast  . Diabetes Father   . Hypertension Father   . Cancer Father     colon  . Cancer Maternal Grandfather     prostate  . Stroke Maternal Grandfather     CVA   Social History  Substance Use Topics  . Smoking status: Never Smoker   . Smokeless tobacco: Never Used  . Alcohol Use: No    Review of Systems  All other systems reviewed and are negative.   Allergies  Review of patient's allergies indicates no known allergies.  Home Medications   Prior to Admission medications   Medication Sig Start Date End Date Taking? Authorizing Provider  aspirin 81 MG tablet Take 81 mg by mouth daily.   Yes Historical Provider, MD  calcium carbonate (OS-CAL) 600 MG TABS tablet Take 600 mg by mouth 2 (two) times daily with a meal.    Yes Historical Provider, MD  diclofenac (VOLTAREN) 75 MG EC tablet Take 1 tablet (75 mg total) by mouth 2 (two) times daily. 02/28/16  Yes Amy Cletis [place], MD  glipiZIDE (GLUCOTROL XL) 10 MG 24 hr tablet Take 1 tablet (10 mg total) by mouth daily with breakfast. 01/13/16  Yes Amy E Bedsole, MD  glucose blood (ONE TOUCH ULTRA TEST) test strip Check blood sugar once daily and as instructed. Dx E11.9 01/19/16  Yes Amy Cletis [place], MD  metFORMIN (GLUCOPHAGE-XR) 500 MG 24 hr tablet Take 2 tablets (1,000 mg total) by mouth daily with breakfast. 01/13/16  Yes Amy Cletis [place], MD  Multiple Vitamins-Minerals (MENS MULTI VITAMIN & MINERAL PO) Take 2 tablets by mouth daily. Men's Vita Fusion   Yes Historical Provider, MD  Omega-3 Fatty Acids (FISH OIL) 1200 MG CAPS Take 1 capsule by mouth daily.   Yes Historical Provider, MD  Docusate Sodium (STOOL SOFTENER) 100 MG capsule Take 100 mg by mouth daily.    Historical Provider, MD      BP 131/91 mmHg  Pulse 86  Temp(Src) 99.3 F (37.4 C) (Tympanic)  Resp 18  Ht 6\' 1"  (1.854 m)  Wt 180 lb (81.647 kg)  BMI 23.75 kg/m2  SpO2 100% Physical Exam  Constitutional: He is oriented to person, place,  and time. No distress.  Alert, nicely groomed  HENT:  Head: Atraumatic.  Eyes:  Conjugate gaze, no eye redness/drainage  Neck: Neck supple.  Cardiovascular: Normal rate.   Pulmonary/Chest: No respiratory distress.  Abdominal: He exhibits no distension.  Genitourinary:  Rare Fordyce type spot observed posterior scrotum, no skin irritation/inflammation, no rash. No scrotal enlargement/swelling/tenderness. No penile lesions.  Musculoskeletal: Normal range of motion.  Neurological: He is alert and oriented to person, place, and time.  Skin: Skin is warm and dry.  No cyanosis  Nursing note and vitals reviewed.   ED Course  Procedures (including critical care time)  Labs Review   CHLAMYDIA/NGC RT PCR [facility] ONLY)pending   Results for orders placed or  performed during the hospital encounter of 04/07/16  Urinalysis complete, with microscopic ([facility] only)  Result Value Ref Range   Color, Urine YELLOW YELLOW   APPearance CLEAR CLEAR   Glucose, UA 100 (A) NEGATIVE mg/dL   Bilirubin Urine NEGATIVE NEGATIVE   Ketones, ur NEGATIVE NEGATIVE mg/dL   Specific Gravity, Urine 1.025 1.005 - 1.030   Hgb urine dipstick NEGATIVE NEGATIVE   pH 5.0 5.0 - 8.0   Protein, ur TRACE (A) NEGATIVE mg/dL   Nitrite NEGATIVE NEGATIVE   Leukocytes, UA NEGATIVE NEGATIVE   RBC / HPF NONE SEEN 0 - 5 RBC/hpf   WBC, UA 0-5 0 - 5 WBC/hpf   Bacteria, UA NONE SEEN NONE SEEN   Squamous Epithelial / LPF 0-5 (A) NONE SEEN     MDM   1. Fordyce spots    Fordyce spots are harmless enlarged sebaceous glands, not a sign of disease or problem.  Recheck or followup with primary care provider/Amy Bedsole if new skin issues or questions arise.      Sherlene Shams, MD 04/07/16 [phone]

## 2016-04-07 NOTE — Discharge Instructions (Signed)
Fordyce spots are harmless enlarged sebaceous glands, not a sign of disease or problem.  Recheck or followup with primary care provider/Amy Bedsole if new skin issues or questions arise.

## 2016-04-10 ENCOUNTER — Telehealth: Payer: Self-pay | Admitting: Family Medicine

## 2016-04-10 DIAGNOSIS — E119 Type 2 diabetes mellitus without complications: Secondary | ICD-10-CM

## 2016-04-10 NOTE — Telephone Encounter (Signed)
-----   Message from Marchia Bond sent at 04/05/2016  9:17 AM EDT ----- Regarding: Dm f/u labs 7/19, need orders. Thanks! :-) Please order future dm f/u labs for pt's upcoming lab appt. Thanks Aniceto Boss

## 2016-04-11 ENCOUNTER — Other Ambulatory Visit: Payer: 59

## 2016-04-17 ENCOUNTER — Ambulatory Visit: Payer: 59 | Admitting: Family Medicine

## 2016-04-17 DIAGNOSIS — Z0289 Encounter for other administrative examinations: Secondary | ICD-10-CM

## 2016-11-15 ENCOUNTER — Encounter: Payer: Self-pay | Admitting: *Deleted

## 2016-11-15 ENCOUNTER — Ambulatory Visit (INDEPENDENT_AMBULATORY_CARE_PROVIDER_SITE_OTHER): Payer: 59 | Admitting: Family Medicine

## 2016-11-15 ENCOUNTER — Encounter: Payer: Self-pay | Admitting: Family Medicine

## 2016-11-15 VITALS — BP 142/97 | HR 87 | Temp 98.2°F

## 2016-11-15 DIAGNOSIS — S46812A Strain of other muscles, fascia and tendons at shoulder and upper arm level, left arm, initial encounter: Secondary | ICD-10-CM | POA: Diagnosis not present

## 2016-11-15 DIAGNOSIS — M25512 Pain in left shoulder: Secondary | ICD-10-CM | POA: Diagnosis not present

## 2016-11-15 MED ORDER — CYCLOBENZAPRINE HCL 10 MG PO TABS: 5.0000 mg | ORAL_TABLET | Freq: Every evening | ORAL | 0 refills | Status: DC | PRN

## 2016-11-15 MED ORDER — DICLOFENAC SODIUM 75 MG PO TBEC: 75.0000 mg | DELAYED_RELEASE_TABLET | Freq: Two times a day (BID) | ORAL | 1 refills | Status: DC

## 2016-11-15 NOTE — Patient Instructions (Signed)
Stop bayer.  Start diclofenac 75 mg twice daily   Start cyclobenzaprine at night for muscle spasm.  Consider massage of upper back. Start  home PT.  Follow up in 2 week if not improving.

## 2016-11-15 NOTE — Assessment & Plan Note (Signed)
If not improving consider X-ray and PT referral.

## 2016-11-15 NOTE — Assessment & Plan Note (Signed)
NSAIDs, muscle relaxant, Given ROM exercises for upper back, heat and massage.

## 2016-11-15 NOTE — Progress Notes (Signed)
   Subjective:    Patient ID: Philip Ponce, male    DOB: Mar 22, 1971, 46 y.o.   MRN: [id number]  HPI   46 year old male presents with new onset  Upper back pain, un left upper shoulder blade. Off and on.  He has been taking bayer 2 every 6 hours regularly. Using biofreeze, OTC patches Pain is worse with turning head to side.   No new fall, no known  Injury, no change activity except he is doing a new job. He does not have any heavy lifting.  No numbness, no weakness in arms.  No fever.    He has history of rotator cuff tendonitis in left.. Tried diclofenac in past. Helped some but intermittent pain still present off med.   Review of Systems  Constitutional: Negative for fatigue.  HENT: Negative for ear pain.   Eyes: Negative for pain.  Respiratory: Negative for shortness of breath.   Cardiovascular: Negative for chest pain.  Gastrointestinal: Negative for abdominal distention.       Objective:   Physical Exam  Constitutional: Vital signs are normal. He appears well-developed and well-nourished.  HENT:  Head: Normocephalic.  Right Ear: Hearing normal.  Left Ear: Hearing normal.  Nose: Nose normal.  Mouth/Throat: Oropharynx is clear and moist and mucous membranes are normal.  Neck: Trachea normal. Carotid bruit is not present. No thyroid mass and no thyromegaly present.  Cardiovascular: Normal rate, regular rhythm and normal pulses.  Exam reveals no gallop, no distant heart sounds and no friction rub.   No murmur heard. No peripheral edema  Pulmonary/Chest: Effort normal and breath sounds normal. No respiratory distress.  Musculoskeletal:       Left shoulder: He exhibits tenderness. He exhibits normal range of motion and no bony tenderness.       Cervical back: He exhibits decreased range of motion and tenderness. He exhibits no bony tenderness.  ttp over left trap, milder pain in left ant sub acr space  Skin: Skin is warm, dry and intact. No rash noted.    Psychiatric: He has a normal mood and affect. His speech is normal and behavior is normal. Thought content normal.          Assessment & Plan:

## 2016-11-15 NOTE — Progress Notes (Signed)
Pre visit review using our clinic review tool, if applicable. No additional management support is needed unless otherwise documented below in the visit note. 

## 2016-11-20 ENCOUNTER — Telehealth: Payer: Self-pay

## 2016-11-20 DIAGNOSIS — D62 Acute posthemorrhagic anemia: Secondary | ICD-10-CM

## 2016-11-20 DIAGNOSIS — Z9884 Bariatric surgery status: Secondary | ICD-10-CM | POA: Insufficient documentation

## 2016-11-20 HISTORY — DX: Acute posthemorrhagic anemia: D62

## 2016-11-20 MED ORDER — MELOXICAM 15 MG PO TABS: 15.0000 mg | ORAL_TABLET | Freq: Every day | ORAL | 0 refills | Status: DC

## 2016-11-20 NOTE — Telephone Encounter (Signed)
V/M left that pt was seen on 11/15/16; pt has not taken cyclobenzaprine but has been taking diclofenac until yesterday when pt had fast heart beat, SOB, CP and weakness in one side. Pt also had abd pain. Pt has not taken diclofenac today because pt thinks these are side effects to diclofenac. I spoke with pt and today pt feels good since not taking diclofenac. Pt request different med for shoulder sent to [facility] [place]. Pt request cb.

## 2016-11-20 NOTE — Telephone Encounter (Signed)
Stop diclofenac.Marland Kitchen Sent in meloxicam.. If not improving will refer to ortho.

## 2016-11-20 NOTE — Telephone Encounter (Signed)
Tag notified as instructed by telephone.

## 2016-11-21 ENCOUNTER — Telehealth: Payer: Self-pay

## 2016-11-21 NOTE — Telephone Encounter (Signed)
PLEASE NOTE: All timestamps contained within this report are represented as [place] Standard Time. CONFIDENTIALTY NOTICE: This fax transmission is intended only for the addressee. It contains information that is legally privileged, confidential or otherwise protected from use or disclosure. If you are not the intended recipient, you are strictly prohibited from reviewing, disclosing, copying using or disseminating any of this information or taking any action in reliance on or regarding this information. If you have received this fax in error, please notify us immediately by telephone so that we can arrange for its return to [place]. Phone: [phone], Toll-Free: [phone], Fax: [phone] Page: 1 of 2 Call Id: [id number] [place] Patient Name: Philip Ponce Gender: Male DOB: 18-Nov-1970 Age: 46 Y 52 M 1 D Return Phone Number: [phone] (Primary) City/State/Zip: [place] Client [place] Night - Client Client Site [place] Who Is Calling Patient / Member / Family / Caregiver Call Type Triage / Clinical Caller Name Shahbaz Plachy Relationship To Patient Spouse Return Phone Number [phone] (Primary) Chief Complaint FAINTING or [place] Reason for Call Symptomatic / Request for [place] states her husband was prescribed medication three days ago. Ever since he has been taking the medication he has been experiencing chest pain and high heart rate. Stopped taking the medication and almost passed out. Has some dark BM with blood in it. Is complaining of being lightheaded. Nurse Assessment Nurse: Thad Ranger RN, Denise Date/Time (Eastern Time): 11/20/2016 4:59:29 PM Confirm and document reason for call. If symptomatic, describe symptoms. ---Caller states her husband was prescribed  medication (antinflammatory) three days ago. Ever since he has been taking the medication he has been experiencing chest pain and high heart rate. Stopped taking the medication and almost passed out. Has some dark BM with blood in it. Is complaining of being lightheaded. Pt confirms above report. Does the PT have any chronic conditions? (i.e. diabetes, asthma, etc.) ---No Guidelines Guideline Title Affirmed Question Chest Pain Shock suspected (e.g., cold/pale/clammy skin, too weak to stand, low BP, rapid pulse) Disp. Time Eilene Ghazi Time) Disposition Final User 11/20/2016 5:04:10 PM Call EMS 911 Now Yes Carmon, RN, Langley Gauss Referrals GO TO [place] Advice Given Per Guideline CALL EMS 911 NOW: Immediate medical attention is needed. You need to hang up and call 911 (or an ambulance). Psychologist, forensic Discretion: I'll call you back in a few minutes to be sure you were able to reach them.) FIRST AID: Lie down with the feet elevated (Reason: counteract shock) CARE ADVICE given per Chest Pain (Adult) guideline. PLEASE NOTE: All timestamps contained within this report are represented as [place] Standard Time. CONFIDENTIALTY NOTICE: This fax transmission is intended only for the addressee. It contains information that is legally privileged, confidential or otherwise protected from use or disclosure. If you are not the intended recipient, you are strictly prohibited from reviewing, disclosing, copying using or disseminating any of this information or taking any action in reliance on or regarding this information. If you have received this fax in error, please notify us immediately by telephone so that we can arrange for its return to [place]. Phone: [phone], Toll-Free: [phone], Fax: [phone] Page: 2 of 2 Call Id: [id number] Comments User: Romeo Apple, RN Date/Time Eilene Ghazi Time): 11/20/2016 5:04:01 PM Pt is at the hospital now visiting his mother. Advised he needs to get to the ER  at the hospital he  is at now. Will call for asst to get him down to the ER now.

## 2016-11-21 NOTE — Telephone Encounter (Signed)
Unable to reach pt by phone.

## 2016-11-23 ENCOUNTER — Encounter: Payer: Self-pay | Admitting: Family Medicine

## 2016-11-23 ENCOUNTER — Ambulatory Visit (INDEPENDENT_AMBULATORY_CARE_PROVIDER_SITE_OTHER): Payer: 59 | Admitting: Family Medicine

## 2016-11-23 VITALS — BP 122/86 | HR 102 | Temp 98.3°F | Resp 16 | Ht 71.0 in | Wt 183.2 lb

## 2016-11-23 DIAGNOSIS — K921 Melena: Secondary | ICD-10-CM | POA: Diagnosis not present

## 2016-11-23 LAB — BASIC METABOLIC PANEL
BUN: 13 mg/dL (ref 7–25)
CHLORIDE: 102 mmol/L (ref 98–110)
CO2: 27 mmol/L (ref 20–31)
CREATININE: 0.98 mg/dL (ref 0.60–1.35)
Calcium: 8.9 mg/dL (ref 8.6–10.3)
Glucose, Bld: 344 mg/dL — ABNORMAL HIGH (ref 65–99)
POTASSIUM: 4.4 mmol/L (ref 3.5–5.3)
Sodium: 137 mmol/L (ref 135–146)

## 2016-11-23 LAB — CBC WITH DIFFERENTIAL/PLATELET
BASOS PCT: 0 %
Basophils Absolute: 0 cells/uL (ref 0–200)
EOS ABS: 74 {cells}/uL (ref 15–500)
Eosinophils Relative: 1 %
HCT: 26.7 % — ABNORMAL LOW (ref 38.5–50.0)
HEMOGLOBIN: 8.3 g/dL — AB (ref 13.2–17.1)
LYMPHS ABS: 2294 {cells}/uL (ref 850–3900)
Lymphocytes Relative: 31 %
MCH: 28.1 pg (ref 27.0–33.0)
MCHC: 31.1 g/dL — ABNORMAL LOW (ref 32.0–36.0)
MCV: 90.5 fL (ref 80.0–100.0)
MONO ABS: 666 {cells}/uL (ref 200–950)
MPV: 11.8 fL (ref 7.5–12.5)
Monocytes Relative: 9 %
NEUTROS ABS: 4366 {cells}/uL (ref 1500–7800)
Neutrophils Relative %: 59 %
Platelets: 260 10*3/uL (ref 140–400)
RBC: 2.95 MIL/uL — AB (ref 4.20–5.80)
RDW: 12.6 % (ref 11.0–15.0)
WBC: 7.4 10*3/uL (ref 3.8–10.8)

## 2016-11-23 MED ORDER — OMEPRAZOLE 40 MG PO CPDR: 40.0000 mg | DELAYED_RELEASE_CAPSULE | Freq: Every day | ORAL | 0 refills | Status: DC

## 2016-11-23 NOTE — Patient Instructions (Signed)
Go to the lab on the way out.  We'll contact you with your lab report. Rosaria Ferries will call about your referral. Keep taking omeprazole.  Don't take extra aspirin, BCs, goody's, ibuprofen.  Okay to restart baby aspirin when you have normal stools.  Take care.  Glad to see you.

## 2016-11-23 NOTE — Progress Notes (Signed)
Pre-visit discussion using our clinic review tool. No additional management support is needed unless otherwise documented below in the visit note.  
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
## 2016-11-25 DIAGNOSIS — K921 Melena: Secondary | ICD-10-CM | POA: Insufficient documentation

## 2016-11-25 NOTE — Assessment & Plan Note (Signed)
>  25 minutes spent in face to face time with patient, >50% spent in counselling or coordination of care.  Pathophysiology discussed with patient. He appears improved. Symptomatically he is improved. Refer to GI. Check routine labs today. Keep taking omeprazole.  Don't take extra aspirin, BCs, goody's, ibuprofen.  Okay to restart baby aspirin when he is having normal stools.  At this point still okay for outpatient follow-up. All questions answered. Routed to PCP as FYI.

## 2016-11-27 ENCOUNTER — Telehealth: Payer: Self-pay | Admitting: Radiology

## 2016-11-27 ENCOUNTER — Ambulatory Visit (INDEPENDENT_AMBULATORY_CARE_PROVIDER_SITE_OTHER): Payer: 59 | Admitting: Family Medicine

## 2016-11-27 ENCOUNTER — Encounter: Payer: Self-pay | Admitting: Family Medicine

## 2016-11-27 VITALS — BP 120/70 | HR 110 | Temp 97.8°F | Ht 71.5 in | Wt 185.8 lb

## 2016-11-27 DIAGNOSIS — E119 Type 2 diabetes mellitus without complications: Secondary | ICD-10-CM

## 2016-11-27 DIAGNOSIS — D62 Acute posthemorrhagic anemia: Secondary | ICD-10-CM | POA: Insufficient documentation

## 2016-11-27 DIAGNOSIS — K921 Melena: Secondary | ICD-10-CM | POA: Diagnosis not present

## 2016-11-27 DIAGNOSIS — R1013 Epigastric pain: Secondary | ICD-10-CM

## 2016-11-27 LAB — HM DIABETES FOOT EXAM

## 2016-11-27 LAB — MICROALBUMIN / CREATININE URINE RATIO
Creatinine,U: 61.6 mg/dL
Microalb Creat Ratio: 1.1 mg/g (ref 0.0–30.0)

## 2016-11-27 LAB — HEMOGLOBIN A1C: Hgb A1c MFr Bld: 9.6 % — ABNORMAL HIGH (ref 4.6–6.5)

## 2016-11-27 LAB — CBC
HCT: 24.1 % — ABNORMAL LOW (ref 39.0–52.0)
MCHC: 32.3 g/dL (ref 30.0–36.0)
MCV: 88.3 fl (ref 78.0–100.0)
Platelets: 253 10*3/uL (ref 150.0–400.0)
RBC: 2.73 Mil/uL — ABNORMAL LOW (ref 4.22–5.81)
RDW: 13.2 % (ref 11.5–15.5)
WBC: 5.2 10*3/uL (ref 4.0–10.5)

## 2016-11-27 MED ORDER — FERROUS SULFATE 325 (65 FE) MG PO TABS: 325.0000 mg | ORAL_TABLET | Freq: Two times a day (BID) | ORAL | 3 refills | Status: DC

## 2016-11-27 NOTE — Assessment & Plan Note (Signed)
Poor control. Due for re-eval. Pt has been eating very poorly and will return to low carb diet. Check CBGs ( given Rx for new meter today).  Follow up in 2 weeks. May need to add med.. Consider returning to insulin.
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
## 2016-11-27 NOTE — Patient Instructions (Addendum)
Please stop at the lab to set up to have labs drawn. Work on low carb diet, decrease the candy. Increase water as able.

## 2016-11-27 NOTE — Telephone Encounter (Signed)
[facility] notified as instructed by telephone.  Called Dr. Dorothey Baseman office and got their voicemail.  Left message asking the office to call be back.  Ferrous Sulfate Rx sent to [facility] on [address].

## 2016-11-27 NOTE — Assessment & Plan Note (Signed)
Gastritis vs ulcer due to NSAID and  Stress.  Now on PPI. Improvement in symptoms.

## 2016-11-27 NOTE — Assessment & Plan Note (Signed)
Due to likely PUD... Re-eval for stability today. Pt may need iron.

## 2016-11-27 NOTE — Addendum Note (Signed)
Addended by: Marchia Bond on: 11/27/2016 09:53 AM   Modules accepted: Orders

## 2016-11-27 NOTE — Telephone Encounter (Signed)
[facility] called a critical HGB 7.8, results given to Dr [name]

## 2016-11-27 NOTE — Telephone Encounter (Signed)
Hg down from 8.3, now at 7.8.  Likely from  Bleeding peptic ulcer. Has appt with Dr. [name] 3/29... Can we move this sooner? LET  ME KNOW IF WE CANNOT MOVE SOONER. Pt felt well at [place] today, no concerns, feeling better than last week, no history of CAD. No clear reason for transfusion. Have pt start ferrous sulfate 325 mg  po BID, 360, 3 RF .Marland Kitchen Can constipate so pt may take miralax as needed with it.  Continue prilosec 40 mg daily.  Stay off aspirin, NSAIDs.  Have pt go to ER if chest pain, shortness of breath, dizziness, etc.

## 2016-11-27 NOTE — Telephone Encounter (Signed)
Spoke with Dr. Dorothey Baseman office.  They have called Philip Ponce and will see him tomorrow 11/28/2016 at 1:00 pm.

## 2016-11-27 NOTE — Progress Notes (Signed)
Pre visit review using our clinic review tool, if applicable. No additional management support is needed unless otherwise documented below in the visit note. 

## 2016-11-28 ENCOUNTER — Encounter: Payer: Self-pay | Admitting: Gastroenterology

## 2016-11-28 ENCOUNTER — Telehealth: Payer: Self-pay

## 2016-11-28 ENCOUNTER — Ambulatory Visit (INDEPENDENT_AMBULATORY_CARE_PROVIDER_SITE_OTHER): Payer: 59 | Admitting: Gastroenterology

## 2016-11-28 ENCOUNTER — Other Ambulatory Visit: Payer: Self-pay

## 2016-11-28 VITALS — BP 128/82 | Ht 71.5 in | Wt 185.2 lb

## 2016-11-28 DIAGNOSIS — Z800 Family history of malignant neoplasm of digestive organs: Secondary | ICD-10-CM | POA: Diagnosis not present

## 2016-11-28 DIAGNOSIS — K2971 Gastritis, unspecified, with bleeding: Secondary | ICD-10-CM

## 2016-11-28 DIAGNOSIS — Z9889 Other specified postprocedural states: Secondary | ICD-10-CM | POA: Diagnosis not present

## 2016-11-28 DIAGNOSIS — K921 Melena: Secondary | ICD-10-CM

## 2016-11-28 DIAGNOSIS — Z9884 Bariatric surgery status: Secondary | ICD-10-CM

## 2016-11-28 NOTE — Telephone Encounter (Signed)
Gastroenterology Pre-Procedure Review  Request Date: 12/03/16 Requesting Physician: [name]  PATIENT REVIEW QUESTIONS: The patient responded to the following health history questions as indicated:    1. Are you having any GI issues? yes (melena) 2. Do you have a personal history of Polyps? no 3. Do you have a family history of Colon Cancer or Polyps? no 4. Diabetes Mellitus? yes (Type 2) 5. Joint replacements in the past 12 months?no 6. Major health problems in the past 3 months?no 7. Any artificial heart valves, MVP, or defibrillator?no    MEDICATIONS & ALLERGIES:    Patient reports the following regarding taking any anticoagulation/antiplatelet therapy:   Plavix, Coumadin, Eliquis, Xarelto, Lovenox, Pradaxa, Brilinta, or Effient? no Aspirin? Yes (81mg )  Patient confirms/reports the following medications:  Current Outpatient Prescriptions  Medication Sig Dispense Refill  . aspirin 81 MG tablet Take 81 mg by mouth daily.    . calcium carbonate (OS-CAL) 600 MG TABS tablet Take 600 mg by mouth 2 (two) times daily with a meal.    . ferrous sulfate 325 (65 FE) MG tablet Take 1 tablet (325 mg total) by mouth 2 (two) times daily with a meal. 60 tablet 3  . glipiZIDE (GLUCOTROL XL) 10 MG 24 hr tablet Take 1 tablet (10 mg total) by mouth daily with breakfast. 90 tablet 3  . glucose blood (ONE TOUCH ULTRA TEST) test strip Check blood sugar once daily and as instructed. Dx E11.9 100 each 1  . metFORMIN (GLUCOPHAGE-XR) 500 MG 24 hr tablet Take 2 tablets (1,000 mg total) by mouth daily with breakfast. 180 tablet 3  . Multiple Vitamins-Minerals (MENS MULTI VITAMIN & MINERAL PO) Take 2 tablets by mouth daily. Men's Vita Fusion    . Omega-3 Fatty Acids (FISH OIL) 1200 MG CAPS Take 1 capsule by mouth daily.    Marland Kitchen omeprazole (PRILOSEC) 40 MG capsule Take 1 capsule (40 mg total) by mouth daily. 30 capsule 0   No current facility-administered medications for this visit.     Patient confirms/reports  the following allergies:  Allergies  Allergen Reactions  . Mobic [Meloxicam] Other (See Comments)    Ulcers in stomach eruption   . Diclofenac Other (See Comments)    Fast heart beat,, CP,SOB and weakness on one side.    No orders of the defined types were placed in this encounter.   AUTHORIZATION INFORMATION Primary Insurance: 1D#: Group #:  Secondary Insurance: 1D#: Group #:  SCHEDULE INFORMATION: Date: 12/03/16 Time: Location: [place]

## 2016-11-28 NOTE — Progress Notes (Signed)
Gastroenterology Consultation  Referring Provider:     Tonia Ghent, MD Primary Care Physician:  Eliezer Lofts, MD Primary Gastroenterologist:  Dr. [name]  Reason for Consultation:     Melena          HPI:   Philip Ponce is a 46 y.o. y/o male referred for consultation & management  by Dr. Eliezer Lofts, MD.    He has been referred for dark stools. He was seen at the ER on 11/20/16 for melena as well as bright red blood per rectum . At that time he also had epigastric pain . At that time he was on NSAID's and was advised to stop and was commenced on a PPI. He was subsequently seen by Dr Damita Dunnings on 11/23/16 .  Hb 01/2016 was 11.3 grams.  Hb 11/2016 8.3 grams with MCV 90 Yesterday Hb 7.8    At the time he visited the ER he was taking Diclofenac 1 tab a day for a week . He does recall having some chest discomfort, abdominal pain in the form of pins. Presently is much better. He stopped taking Diclofenac after the ER visit. He recalls he had black stool on the day of the ER visit, he later continued for a few days. Presently his stool is brown. No change in bowel habits.   His father died of colon cancer.He has never had a colonoscopy. He is taking Omeprazole 40 mg , daily twice. Feels tired and has fatigue.   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 Diabetes mellitus type I ([place])   . Slipped intervertebral disc    L4 L5    Past Surgical History:  Procedure Laterality Date  . GASTRIC BYPASS      Prior to Admission medications   Medication Sig Start Date End Date Taking? Authorizing Provider  aspirin 81 MG tablet Take 81 mg by mouth daily.    Historical Provider, MD  calcium carbonate (OS-CAL) 600 MG TABS tablet Take 600 mg by mouth 2 (two) times daily with a meal.    Historical Provider, MD  ferrous sulfate 325 (65 FE) MG tablet Take 1 tablet (325 mg total) by mouth 2 (two) times daily with a meal. 11/27/16   Amy E  Bedsole, MD  glipiZIDE (GLUCOTROL XL) 10 MG 24 hr tablet Take 1 tablet (10 mg total) by mouth daily with breakfast. 01/13/16   Amy E Bedsole, MD  glucose blood (ONE TOUCH ULTRA TEST) test strip Check blood sugar once daily and as instructed. Dx E11.9 01/19/16   [name], MD  metFORMIN (GLUCOPHAGE-XR) 500 MG 24 hr tablet Take 2 tablets (1,000 mg total) by mouth daily with breakfast. 01/13/16   Amy Cletis [place], MD  Multiple Vitamins-Minerals (MENS MULTI VITAMIN & MINERAL PO) Take 2 tablets by mouth daily. Men's Vita Fusion    Historical Provider, MD  Omega-3 Fatty Acids (FISH OIL) 1200 MG CAPS Take 1 capsule by mouth daily.    Historical Provider, MD  omeprazole (PRILOSEC) 40 MG capsule Take 1 capsule (40 mg total) by mouth daily. 11/23/16 12/23/16  Tonia Ghent, MD    Family History  Problem Relation Age of Onset  . Diabetes Mother   . Hypertension Mother   . Cancer Mother     breast  . Diabetes Father   . Hypertension Father   . Cancer Father  colon  . Cancer Maternal Grandfather     prostate  . Stroke Maternal Grandfather     CVA     Social History  Substance Use Topics  . Smoking status: Never Smoker  . Smokeless tobacco: Never Used  . Alcohol use No    Allergies as of 11/28/2016 - Review Complete 11/27/2016  Allergen Reaction Noted  . Mobic [meloxicam] Other (See Comments) 11/23/2016  . Diclofenac Other (See Comments) 11/20/2016    Review of Systems:    All systems reviewed and negative except where noted in HPI.   Physical Exam:  There were no vitals taken for this visit. No LMP for male patient. Psych:  Alert and cooperative. Normal mood and affect. General:   Alert,  Well-developed, well-nourished, pleasant and cooperative in NAD Head:  Normocephalic and atraumatic. Eyes:  Sclera clear, no icterus.   Conjunctiva pink. Ears:  Normal auditory acuity. Nose:  No deformity, discharge, or lesions. Mouth:  No deformity or lesions,oropharynx pink & moist. Neck:   Supple; no masses or thyromegaly. Lungs:  Respirations even and unlabored.  Clear throughout to auscultation.   No wheezes, crackles, or rhonchi. No acute distress. Heart:  Regular rate and rhythm; no murmurs, clicks, rubs, or gallops. Abdomen: multiple scars from laparascopic ports Normal bowel sounds.  No bruits.  Soft, non-tender and non-distended without masses, hepatosplenomegaly or hernias noted.  No guarding or rebound tenderness.    Msk:  Symmetrical without gross deformities. Good, equal movement & strength bilaterally. Pulses:  Normal pulses noted. Extremities:  No clubbing or edema.  No cyanosis. Neurologic:  Alert and oriented x3;  grossly normal neurologically. Psych:  Alert and cooperative. Normal mood and affect.  Imaging Studies: No results found.  Assessment and Plan:   Philip Ponce 46 y.o. male referred for melena. H/o gastric by pass, NSAID use, father had colon cancer and he has never had a colonoscopy.   Likely GI bleed fro,m NSAID related gastric or anastomotic ulcer .  Plan   1. No NSAID's 2. H pylori stool antigen 3. Continue PPI 4. Check iron studies, b12,folate, tsh in view of gastric by pass he may have a baseline iron deficiency anemia, It may appear normocytic due to coexisting low b12 levels.  5. EGD+ colonoscopy due to family history of colon cancer 6. CBC on Friday   F/u in 6-8 weeks    Dr [name] MD

## 2016-11-29 ENCOUNTER — Telehealth: Payer: Self-pay | Admitting: Family Medicine

## 2016-11-29 DIAGNOSIS — Z7689 Persons encountering health services in other specified circumstances: Secondary | ICD-10-CM

## 2016-11-29 NOTE — Telephone Encounter (Signed)
Pt dropped off disability forms to be filled out In dr bedsole's IN BOX

## 2016-11-29 NOTE — Telephone Encounter (Signed)
Completed in outbox.

## 2016-11-30 ENCOUNTER — Other Ambulatory Visit
Admission: RE | Admit: 2016-11-30 | Discharge: 2016-11-30 | Disposition: A | Discharge status: Home / Self Care | Payer: 59 | Source: Ambulatory Visit | Attending: Gastroenterology | Admitting: Gastroenterology

## 2016-11-30 ENCOUNTER — Encounter: Payer: Self-pay | Admitting: *Deleted

## 2016-11-30 ENCOUNTER — Telehealth: Payer: Self-pay

## 2016-11-30 ENCOUNTER — Ambulatory Visit: Payer: 59 | Admitting: Family Medicine

## 2016-11-30 DIAGNOSIS — K921 Melena: Secondary | ICD-10-CM | POA: Insufficient documentation

## 2016-11-30 DIAGNOSIS — Z9884 Bariatric surgery status: Secondary | ICD-10-CM | POA: Diagnosis not present

## 2016-11-30 LAB — TSH: TSH: 1.019 u[IU]/mL (ref 0.350–4.500)

## 2016-11-30 LAB — CBC WITH DIFFERENTIAL/PLATELET
BASOS PCT: 1 %
Basophils Absolute: 0 10*3/uL (ref 0–0.1)
EOS ABS: 0.1 10*3/uL (ref 0–0.7)
EOS PCT: 1 %
HEMATOCRIT: 26.8 % — AB (ref 40.0–52.0)
Hemoglobin: 8.5 g/dL — ABNORMAL LOW (ref 13.0–18.0)
Lymphocytes Relative: 22 %
Lymphs Abs: 1.1 10*3/uL (ref 1.0–3.6)
MCH: 26.9 pg (ref 26.0–34.0)
MCHC: 31.7 g/dL — AB (ref 32.0–36.0)
MCV: 85 fL (ref 80.0–100.0)
MONO ABS: 0.5 10*3/uL (ref 0.2–1.0)
MONOS PCT: 10 %
NEUTROS ABS: 3.3 10*3/uL (ref 1.4–6.5)
Neutrophils Relative %: 66 %
Platelets: 328 10*3/uL (ref 150–440)
RBC: 3.16 MIL/uL — ABNORMAL LOW (ref 4.40–5.90)
RDW: 13.6 % (ref 11.5–14.5)
WBC: 4.9 10*3/uL (ref 3.8–10.6)

## 2016-11-30 LAB — VITAMIN B12: Vitamin B-12: 511 pg/mL (ref 180–914)

## 2016-11-30 LAB — FOLATE: FOLATE: 27 ng/mL (ref >5.9)

## 2016-11-30 NOTE — Telephone Encounter (Signed)
Advised pt of lab results per Dr. Anna. 

## 2016-12-03 ENCOUNTER — Encounter
Admission: RE | Disposition: A | Discharge status: Home / Self Care | Payer: Self-pay | Source: Ambulatory Visit | Attending: Gastroenterology

## 2016-12-03 ENCOUNTER — Ambulatory Visit: Payer: 59 | Admitting: Anesthesiology

## 2016-12-03 ENCOUNTER — Encounter: Payer: Self-pay | Admitting: *Deleted

## 2016-12-03 ENCOUNTER — Ambulatory Visit (HOSPITAL_BASED_OUTPATIENT_CLINIC_OR_DEPARTMENT_OTHER)
Admission: RE | Admit: 2016-12-03 | Discharge: 2016-12-03 | Disposition: A | Discharge status: Home / Self Care | Payer: 59 | Source: Ambulatory Visit | Attending: Gastroenterology | Admitting: Gastroenterology

## 2016-12-03 DIAGNOSIS — K2971 Gastritis, unspecified, with bleeding: Secondary | ICD-10-CM | POA: Diagnosis not present

## 2016-12-03 DIAGNOSIS — Z79899 Other long term (current) drug therapy: Secondary | ICD-10-CM | POA: Insufficient documentation

## 2016-12-03 DIAGNOSIS — Z980 Intestinal bypass and anastomosis status: Secondary | ICD-10-CM | POA: Insufficient documentation

## 2016-12-03 DIAGNOSIS — Z9884 Bariatric surgery status: Secondary | ICD-10-CM

## 2016-12-03 DIAGNOSIS — G4730 Sleep apnea, unspecified: Secondary | ICD-10-CM

## 2016-12-03 DIAGNOSIS — K921 Melena: Secondary | ICD-10-CM

## 2016-12-03 DIAGNOSIS — Z7982 Long term (current) use of aspirin: Secondary | ICD-10-CM

## 2016-12-03 DIAGNOSIS — I110 Hypertensive heart disease with heart failure: Secondary | ICD-10-CM | POA: Diagnosis not present

## 2016-12-03 DIAGNOSIS — I213 ST elevation (STEMI) myocardial infarction of unspecified site: Secondary | ICD-10-CM | POA: Diagnosis not present

## 2016-12-03 DIAGNOSIS — I10 Essential (primary) hypertension: Secondary | ICD-10-CM | POA: Insufficient documentation

## 2016-12-03 DIAGNOSIS — E109 Type 1 diabetes mellitus without complications: Secondary | ICD-10-CM

## 2016-12-03 HISTORY — PX: ESOPHAGOGASTRODUODENOSCOPY (EGD) WITH PROPOFOL: SHX5813

## 2016-12-03 HISTORY — PX: COLONOSCOPY WITH PROPOFOL: SHX5780

## 2016-12-03 LAB — CBC WITH DIFFERENTIAL/PLATELET
BASOS PCT: 1 %
Basophils Absolute: 0.1 10*3/uL (ref 0–0.1)
Eosinophils Absolute: 0.1 10*3/uL (ref 0–0.7)
Eosinophils Relative: 2 %
HEMATOCRIT: 23.9 % — AB (ref 40.0–52.0)
Hemoglobin: 7.8 g/dL — ABNORMAL LOW (ref 13.0–18.0)
LYMPHS PCT: 29 %
Lymphs Abs: 1.2 10*3/uL (ref 1.0–3.6)
MCH: 27.7 pg (ref 26.0–34.0)
MCHC: 32.4 g/dL (ref 32.0–36.0)
MCV: 85.3 fL (ref 80.0–100.0)
MONO ABS: 0.4 10*3/uL (ref 0.2–1.0)
Monocytes Relative: 11 %
NEUTROS ABS: 2.4 10*3/uL (ref 1.4–6.5)
NEUTROS PCT: 57 %
Platelets: 319 10*3/uL (ref 150–440)
RBC: 2.81 MIL/uL — ABNORMAL LOW (ref 4.40–5.90)
RDW: 13.8 % (ref 11.5–14.5)
WBC: 4.2 10*3/uL (ref 3.8–10.6)

## 2016-12-03 LAB — IRON AND TIBC
Iron: 15 ug/dL — ABNORMAL LOW (ref 45–182)
Saturation Ratios: 5 % — ABNORMAL LOW (ref 17.9–39.5)
TIBC: 295 ug/dL (ref 250–450)
UIBC: 280 ug/dL

## 2016-12-03 LAB — FERRITIN: FERRITIN: 8 ng/mL — AB (ref 24–336)

## 2016-12-03 LAB — GLUCOSE, CAPILLARY: GLUCOSE-CAPILLARY: 266 mg/dL — AB (ref 65–99)

## 2016-12-03 LAB — H. PYLORI ANTIGEN, STOOL: H. PYLORI STOOL AG, EIA: NEGATIVE

## 2016-12-03 SURGERY — COLONOSCOPY WITH PROPOFOL
Anesthesia: General

## 2016-12-03 MED ORDER — PROPOFOL 500 MG/50ML IV EMUL
INTRAVENOUS | Status: AC
  Filled 2016-12-03: qty 50

## 2016-12-03 MED ORDER — PROPOFOL 10 MG/ML IV BOLUS
INTRAVENOUS | Status: DC | PRN
  Administered 2016-12-03: 50 mg via INTRAVENOUS
  Administered 2016-12-03: 100 mg via INTRAVENOUS

## 2016-12-03 MED ORDER — PROPOFOL 500 MG/50ML IV EMUL
INTRAVENOUS | Status: DC | PRN
  Administered 2016-12-03: 120 ug/kg/min via INTRAVENOUS

## 2016-12-03 MED ORDER — PHENYLEPHRINE HCL 10 MG/ML IJ SOLN
INTRAMUSCULAR | Status: AC
  Filled 2016-12-03: qty 1

## 2016-12-03 MED ORDER — PHENYLEPHRINE HCL 10 MG/ML IJ SOLN
INTRAMUSCULAR | Status: DC | PRN
  Administered 2016-12-03 (×2): 100 ug via INTRAVENOUS

## 2016-12-03 MED ORDER — SODIUM CHLORIDE 0.9 % IV SOLN
INTRAVENOUS | Status: DC
  Administered 2016-12-03: 08:00:00 via INTRAVENOUS

## 2016-12-03 MED ORDER — FENTANYL CITRATE (PF) 100 MCG/2ML IJ SOLN
INTRAMUSCULAR | Status: DC | PRN
  Administered 2016-12-03 (×2): 50 ug via INTRAVENOUS

## 2016-12-03 MED ORDER — FENTANYL CITRATE (PF) 100 MCG/2ML IJ SOLN
INTRAMUSCULAR | Status: AC
  Filled 2016-12-03: qty 2

## 2016-12-03 NOTE — Anesthesia Post-op Follow-up Note (Cosign Needed)
Anesthesia QCDR form completed.        

## 2016-12-03 NOTE — Brief Op Note (Signed)
Labs ordered by Vicente Males and drawn prior to discharge

## 2016-12-03 NOTE — Op Note (Signed)
[facility] Gastroenterology Patient Name: Philip Ponce Procedure Date: 12/03/2016 7:43 AM MRN: [id number] Account #: [id number] Date of Birth: Sep 21, 1971 Admit Type: Outpatient Age: 46 Room: [facility] ENDO ROOM 4 Gender: Male Note Status: Finalized Procedure:            Colonoscopy Indications:          Melena Providers:            [name] MD, MD Medicines:            Monitored Anesthesia Care Complications:        No immediate complications. Procedure:            Pre-Anesthesia Assessment:                       - Prior to the procedure, a History and Physical was                        performed, and patient medications, allergies and                        sensitivities were reviewed. The patient's tolerance of                        previous anesthesia was reviewed.                       - The risks and benefits of the procedure and the                        sedation options and risks were discussed with the                        patient. All questions were answered and informed                        consent was obtained.                       - ASA Grade Assessment: II - A patient with mild                        systemic disease.                       After obtaining informed consent, the colonoscope was                        passed under direct vision. Throughout the procedure,                        the patient's blood pressure, pulse, and oxygen                        saturations were monitored continuously. The                        Colonoscope was introduced through the anus and                        advanced to the the cecum, identified by the  appendiceal orifice, IC valve and transillumination.                        The colonoscopy was performed with ease. The patient                        tolerated the procedure well. The quality of the bowel                        preparation was excellent. Findings:      The  entire examined colon appeared normal on direct and retroflexion       views. Impression:           - The entire examined colon is normal on direct and                        retroflexion views.                       - No specimens collected. Recommendation:       - Discharge patient to home (with escort).                       - Resume previous diet.                       - Continue present medications.                       - Repeat colonoscopy in 5 years for surveillance.                       - Return to GI office in 4 weeks. Procedure Code(s):    --- Professional ---                       [phone], Colonoscopy, flexible; diagnostic, including                        collection of specimen(s) by brushing or washing, when                        performed (separate procedure) Diagnosis Code(s):    --- Professional ---                       K92.1, Melena (includes Hematochezia) CPT copyright 2016 American Medical Association. All rights reserved. The codes documented in this report are preliminary and upon coder review may  be revised to meet current compliance requirements. [name], MD [name] MD, MD 12/03/2016 8:41:38 AM This report has been signed electronically. Number of Addenda: 0 Note Initiated On: 12/03/2016 7:43 AM Scope Withdrawal Time: 0 hours 13 minutes 28 seconds  Total Procedure Duration: 0 hours 16 minutes 57 seconds       [facility]

## 2016-12-03 NOTE — Transfer of Care (Signed)
Immediate Anesthesia Transfer of Care Note  Patient: Philip Ponce  Procedure(s) Performed: Procedure(s): COLONOSCOPY WITH PROPOFOL (N/A) ESOPHAGOGASTRODUODENOSCOPY (EGD) WITH PROPOFOL (N/A)  Patient Location: PACU  Anesthesia Type:General  Level of Consciousness: awake, alert  and oriented  Airway & Oxygen Therapy: Patient Spontanous Breathing and Patient connected to nasal cannula oxygen  Post-op Assessment: Report given to RN and Post -op Vital signs reviewed and stable  Post vital signs: Reviewed and stable  Last Vitals:  Vitals:   12/03/16 0734 12/03/16 0848  BP: 138/87 100/67  Pulse: 94 80  Resp: 14 18  Temp: (!) 35.9 C 36.2 C    Last Pain:  Vitals:   12/03/16 0848  TempSrc: Tympanic         Complications: No apparent anesthesia complications

## 2016-12-03 NOTE — H&P (Signed)
[name] MD [address], [place], [place] [address] Phone: [phone] Fax : [phone]  Primary Care Physician:  Eliezer Lofts, MD Primary Gastroenterologist:  Dr. [name]   Pre-Procedure History & Physical: HPI:  Philip Ponce is a 46 y.o. male is here for an endoscopy and colonoscopy.   Past Medical History:  Diagnosis Date  . Diabetes mellitus type I ([place])   . Slipped intervertebral disc    L4 L5    Past Surgical History:  Procedure Laterality Date  . GASTRIC BYPASS      Prior to Admission medications   Medication Sig Start Date End Date Taking? Authorizing Provider  calcium carbonate (OS-CAL) 600 MG TABS tablet Take 600 mg by mouth 2 (two) times daily with a meal.   Yes Historical Provider, MD  ferrous sulfate 325 (65 FE) MG tablet Take 1 tablet (325 mg total) by mouth 2 (two) times daily with a meal. 11/27/16  Yes Amy E Bedsole, MD  glipiZIDE (GLUCOTROL XL) 10 MG 24 hr tablet Take 1 tablet (10 mg total) by mouth daily with breakfast. 01/13/16  Yes Amy E [name], MD  metFORMIN (GLUCOPHAGE-XR) 500 MG 24 hr tablet Take 2 tablets (1,000 mg total) by mouth daily with breakfast. 01/13/16  Yes Amy E Bedsole, MD  Multiple Vitamins-Minerals (MENS MULTI VITAMIN & MINERAL PO) Take 2 tablets by mouth daily. Men's Vita Fusion   Yes Historical Provider, MD  Omega-3 Fatty Acids (FISH OIL) 1200 MG CAPS Take 1 capsule by mouth daily.   Yes Historical Provider, MD  omeprazole (PRILOSEC) 40 MG capsule Take 1 capsule (40 mg total) by mouth daily. 11/23/16 12/23/16 Yes Tonia Ghent, MD  aspirin 81 MG tablet Take 81 mg by mouth daily.    Historical Provider, MD  glucose blood (ONE TOUCH ULTRA TEST) test strip Check blood sugar once daily and as instructed. Dx E11.9 01/19/16   [name], MD    Allergies as of 11/28/2016 - Review Complete 11/28/2016  Allergen Reaction Noted  . Mobic [meloxicam] Other (See Comments) 11/23/2016  . Diclofenac Other (See Comments) 11/20/2016     Family History  Problem Relation Age of Onset  . Diabetes Mother   . Hypertension Mother   . Cancer Mother     breast  . Diabetes Father   . Hypertension Father   . Cancer Father     colon  . Cancer Maternal Grandfather     prostate  . Stroke Maternal Grandfather     CVA    Social History   Social History  . Marital status: Married    Spouse name: N/A  . Number of children: N/A  . Years of education: N/A   Occupational History  . 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 Smoking status: Never Smoker  . Smokeless tobacco: Never Used  . Alcohol use No  . Drug use: No  . Sexual activity: Not on file   Other Topics Concern  . Not on file   Social History Narrative   Regular exercise-yes, walking one mile per day   Diet: fast food, diet soda, unsweeted tea    Review of Systems: See HPI, otherwise negative ROS  Physical Exam: BP 138/87   Pulse 94   Temp (!) 96.6 F (35.9 C) (Tympanic)   Resp 14   Ht 6\' 1"  (1.854 m)   Wt 185 lb (83.9 kg)   SpO2 99%   BMI 24.41  kg/m  General:   Alert,  pleasant and cooperative in NAD Head:  Normocephalic and atraumatic. Neck:  Supple; no masses or thyromegaly. Lungs:  Clear throughout to auscultation.    Heart:  Regular rate and rhythm. Abdomen:  Soft, nontender and nondistended. Normal bowel sounds, without guarding, and without rebound.   Neurologic:  Alert and  oriented x4;  grossly normal neurologically.  Impression/Plan: Philip Ponce is here for an endoscopy and colonoscopy to be performed for melena   Risks, benefits, limitations, and alternatives regarding  endoscopy and colonoscopy have been reviewed with the patient.  Questions have been answered.  All parties agreeable.   [name], MD  12/03/2016, 8:00 AM

## 2016-12-03 NOTE — Anesthesia Postprocedure Evaluation (Signed)
Anesthesia Post Note  Patient: JAMARRI VUNCANNON  Procedure(s) Performed: Procedure(s) (LRB): COLONOSCOPY WITH PROPOFOL (N/A) ESOPHAGOGASTRODUODENOSCOPY (EGD) WITH PROPOFOL (N/A)  Patient location during evaluation: Endoscopy Anesthesia Type: General Level of consciousness: awake and alert Pain management: pain level controlled Vital Signs Assessment: post-procedure vital signs reviewed and stable Respiratory status: spontaneous breathing, nonlabored ventilation, respiratory function stable and patient connected to nasal cannula oxygen Cardiovascular status: blood pressure returned to baseline and stable Postop Assessment: no signs of nausea or vomiting Anesthetic complications: no     Last Vitals:  Vitals:   12/03/16 0916 12/03/16 0926  BP: 111/82 120/88  Pulse: 79 77  Resp: 11 (!) 9  Temp:      Last Pain:  Vitals:   12/03/16 0848  TempSrc: Tympanic                 Martha Clan

## 2016-12-03 NOTE — Op Note (Signed)
[facility] Gastroenterology Patient Name: Philip Ponce Procedure Date: 12/03/2016 7:42 AM MRN: [id number] Account #: [id number] Date of Birth: 27-Mar-1971 Admit Type: Outpatient Age: 46 Room: [facility] ENDO ROOM 4 Gender: Male Note Status: Finalized Procedure:            Upper GI endoscopy Indications:          Melena Providers:            [name] MD, MD Medicines:            Monitored Anesthesia Care Complications:        No immediate complications. Procedure:            Pre-Anesthesia Assessment:                       - Prior to the procedure, a History and Physical was                        performed, and patient medications, allergies and                        sensitivities were reviewed. The patient's tolerance of                        previous anesthesia was reviewed.                       - The risks and benefits of the procedure and the                        sedation options and risks were discussed with the                        patient. All questions were answered and informed                        consent was obtained.                       - ASA Grade Assessment: II - A patient with mild                        systemic disease.                       After obtaining informed consent, the endoscope was                        passed under direct vision. Throughout the procedure,                        the patient's blood pressure, pulse, and oxygen                        saturations were monitored continuously. The Endoscope                        was introduced through the mouth, and advanced to the                        jejunum. The upper GI endoscopy was accomplished with  ease. The patient tolerated the procedure well. Findings:      The esophagus was normal.      Evidence of a gastric bypass was found. A gastric pouch with a small       size was found. The staple line appeared intact. The gastrojejunal   anastomosis was characterized by healthy appearing mucosa. This was       traversed. The pouch-to-jejunum limb was characterized by healthy       appearing mucosa. The jejunojejunal anastomosis was characterized by       healthy appearing mucosa. Biopsies for histology were taken with a cold       forceps for evaluation of celiac disease.      The examined jejunum was normal. Biopsies for histology were taken with       a cold forceps for evaluation of celiac disease. Impression:           - No specimens collected. Recommendation:       - Await pathology results.                       - Perform a colonoscopy today. Procedure Code(s):    --- Professional ---                       [id number], Esophagogastroduodenoscopy, flexible, transoral;                        with biopsy, single or multiple Diagnosis Code(s):    --- Professional ---                       K92.1, Melena (includes Hematochezia) CPT copyright 2016 American Medical Association. All rights reserved. The codes documented in this report are preliminary and upon coder review may  be revised to meet current compliance requirements. [name], MD [name] MD, MD 12/03/2016 8:21:24 AM This report has been signed electronically. Number of Addenda: 0 Note Initiated On: 12/03/2016 7:42 AM      [facility]

## 2016-12-03 NOTE — Anesthesia Preprocedure Evaluation (Signed)
Anesthesia Evaluation  Patient identified by MRN, date of birth, ID band Patient awake    Reviewed: Allergy & Precautions, H&P , NPO status , Patient's Chart, lab work & pertinent test results, reviewed documented beta blocker date and time   History of Anesthesia Complications (+) Family history of anesthesia reaction and history of anesthetic complications  Airway Mallampati: IV  TM Distance: >3 FB Neck ROM: full    Dental  (+) Loose, Missing   Pulmonary shortness of breath, sleep apnea (h/o, but no more) , neg COPD, neg recent URI,           Cardiovascular Exercise Tolerance: Good hypertension (h/o, but no more), (-) angina+CHF (h/o, but no more)  (-) CAD, (-) Past MI, (-) Cardiac Stents and (-) CABG + dysrhythmias (-) Valvular Problems/Murmurs     Neuro/Psych negative neurological ROS  negative psych ROS   GI/Hepatic Neg liver ROS, GERD  ,  Endo/Other  diabetes  Renal/GU negative Renal ROS  negative genitourinary   Musculoskeletal   Abdominal   Peds  Hematology negative hematology ROS (+)   Anesthesia Other Findings Past Medical History: No date: Diabetes mellitus type I ([place]) No date: Slipped intervertebral disc     Comment: L4 L5   Reproductive/Obstetrics negative OB ROS                             Anesthesia Physical Anesthesia Plan  ASA: II  Anesthesia Plan: General   Post-op Pain Management:    Induction:   Airway Management Planned:   Additional Equipment:   Intra-op Plan:   Post-operative Plan:   Informed Consent: I have reviewed the patients History and Physical, chart, labs and discussed the procedure including the risks, benefits and alternatives for the proposed anesthesia with the patient or authorized representative who has indicated his/her understanding and acceptance.   Dental Advisory Given  Plan Discussed with: Anesthesiologist, CRNA and  Surgeon  Anesthesia Plan Comments:         Anesthesia Quick Evaluation

## 2016-12-04 ENCOUNTER — Other Ambulatory Visit: Payer: Self-pay

## 2016-12-04 ENCOUNTER — Encounter
Admission: EM | Disposition: A | Discharge status: Home / Self Care | Payer: Self-pay | Source: Home / Self Care | Attending: Specialist

## 2016-12-04 ENCOUNTER — Telehealth: Payer: Self-pay | Admitting: Family Medicine

## 2016-12-04 ENCOUNTER — Emergency Department: Payer: 59

## 2016-12-04 ENCOUNTER — Encounter: Payer: Self-pay | Admitting: Emergency Medicine

## 2016-12-04 ENCOUNTER — Inpatient Hospital Stay
Admission: EM | Admit: 2016-12-04 | Discharge: 2016-12-06 | DRG: 287 | Disposition: A | Discharge status: Home / Self Care | Payer: 59 | Attending: Internal Medicine | Admitting: Internal Medicine

## 2016-12-04 DIAGNOSIS — Z833 Family history of diabetes mellitus: Secondary | ICD-10-CM

## 2016-12-04 DIAGNOSIS — Z7984 Long term (current) use of oral hypoglycemic drugs: Secondary | ICD-10-CM | POA: Diagnosis not present

## 2016-12-04 DIAGNOSIS — I213 ST elevation (STEMI) myocardial infarction of unspecified site: Secondary | ICD-10-CM | POA: Diagnosis present

## 2016-12-04 DIAGNOSIS — I110 Hypertensive heart disease with heart failure: Principal | ICD-10-CM | POA: Diagnosis present

## 2016-12-04 DIAGNOSIS — Z8711 Personal history of peptic ulcer disease: Secondary | ICD-10-CM | POA: Diagnosis not present

## 2016-12-04 DIAGNOSIS — Z9884 Bariatric surgery status: Secondary | ICD-10-CM

## 2016-12-04 DIAGNOSIS — Z9889 Other specified postprocedural states: Secondary | ICD-10-CM

## 2016-12-04 DIAGNOSIS — I428 Other cardiomyopathies: Secondary | ICD-10-CM | POA: Diagnosis not present

## 2016-12-04 DIAGNOSIS — Z79899 Other long term (current) drug therapy: Secondary | ICD-10-CM

## 2016-12-04 DIAGNOSIS — E876 Hypokalemia: Secondary | ICD-10-CM | POA: Diagnosis present

## 2016-12-04 DIAGNOSIS — D500 Iron deficiency anemia secondary to blood loss (chronic): Secondary | ICD-10-CM

## 2016-12-04 DIAGNOSIS — R0602 Shortness of breath: Secondary | ICD-10-CM | POA: Diagnosis not present

## 2016-12-04 DIAGNOSIS — Z8249 Family history of ischemic heart disease and other diseases of the circulatory system: Secondary | ICD-10-CM | POA: Diagnosis not present

## 2016-12-04 DIAGNOSIS — D509 Iron deficiency anemia, unspecified: Secondary | ICD-10-CM | POA: Diagnosis present

## 2016-12-04 DIAGNOSIS — I420 Dilated cardiomyopathy: Secondary | ICD-10-CM | POA: Diagnosis present

## 2016-12-04 DIAGNOSIS — E109 Type 1 diabetes mellitus without complications: Secondary | ICD-10-CM | POA: Diagnosis present

## 2016-12-04 DIAGNOSIS — Z7982 Long term (current) use of aspirin: Secondary | ICD-10-CM | POA: Diagnosis not present

## 2016-12-04 DIAGNOSIS — I447 Left bundle-branch block, unspecified: Secondary | ICD-10-CM | POA: Diagnosis present

## 2016-12-04 DIAGNOSIS — R9431 Abnormal electrocardiogram [ECG] [EKG]: Secondary | ICD-10-CM | POA: Diagnosis not present

## 2016-12-04 DIAGNOSIS — I5021 Acute systolic (congestive) heart failure: Secondary | ICD-10-CM | POA: Diagnosis not present

## 2016-12-04 DIAGNOSIS — I5023 Acute on chronic systolic (congestive) heart failure: Secondary | ICD-10-CM | POA: Diagnosis present

## 2016-12-04 HISTORY — DX: Acute posthemorrhagic anemia: D62

## 2016-12-04 HISTORY — PX: LEFT HEART CATH AND CORONARY ANGIOGRAPHY: CATH118249

## 2016-12-04 LAB — TROPONIN I: Troponin I: <0.03 ng/mL (ref <0.03)

## 2016-12-04 LAB — CBC
HEMATOCRIT: 26.3 % — AB (ref 40.0–52.0)
HEMOGLOBIN: 8.6 g/dL — AB (ref 13.0–18.0)
MCH: 28 pg (ref 26.0–34.0)
MCHC: 32.8 g/dL (ref 32.0–36.0)
MCV: 85.4 fL (ref 80.0–100.0)
PLATELETS: 377 10*3/uL (ref 150–440)
RBC: 3.08 MIL/uL — AB (ref 4.40–5.90)
RDW: 13.9 % (ref 11.5–14.5)
WBC: 6.3 10*3/uL (ref 3.8–10.6)

## 2016-12-04 LAB — BASIC METABOLIC PANEL
Anion gap: 6 (ref 5–15)
BUN: 11 mg/dL (ref 6–20)
CHLORIDE: 104 mmol/L (ref 101–111)
CO2: 26 mmol/L (ref 22–32)
CREATININE: 0.8 mg/dL (ref 0.61–1.24)
Calcium: 8.8 mg/dL — ABNORMAL LOW (ref 8.9–10.3)
GFR calc non Af Amer: >60 mL/min (ref >60)
Glucose, Bld: 228 mg/dL — ABNORMAL HIGH (ref 65–99)
Potassium: 4.1 mmol/L (ref 3.5–5.1)
Sodium: 136 mmol/L (ref 135–145)

## 2016-12-04 LAB — COPPER, SERUM: Copper: 135 ug/dL (ref 72–166)

## 2016-12-04 LAB — PROTIME-INR
INR: 1.08
Prothrombin Time: 14 seconds (ref 11.4–15.2)

## 2016-12-04 LAB — GLUCOSE, CAPILLARY: GLUCOSE-CAPILLARY: 353 mg/dL — AB (ref 65–99)

## 2016-12-04 LAB — MRSA PCR SCREENING: MRSA BY PCR: NEGATIVE

## 2016-12-04 LAB — APTT: aPTT: 32 seconds (ref 24–36)

## 2016-12-04 LAB — SURGICAL PATHOLOGY

## 2016-12-04 SURGERY — LEFT HEART CATH AND CORONARY ANGIOGRAPHY
Anesthesia: Moderate Sedation

## 2016-12-04 MED ORDER — GLIPIZIDE ER 10 MG PO TB24
10.0000 mg | ORAL_TABLET | Freq: Every day | ORAL | Status: DC
  Administered 2016-12-05 – 2016-12-06 (×2): 10 mg via ORAL
  Filled 2016-12-04: qty 4
  Filled 2016-12-04: qty 1

## 2016-12-04 MED ORDER — HEPARIN (PORCINE) IN NACL 100-0.45 UNIT/ML-% IJ SOLN
1000.0000 [IU]/h | INTRAMUSCULAR | Status: DC
Start: 2016-12-04 — End: 2016-12-04
  Filled 2016-12-04: qty 250

## 2016-12-04 MED ORDER — CYCLOBENZAPRINE HCL 10 MG PO TABS: 5.0000 mg | ORAL_TABLET | Freq: Every evening | ORAL | Status: DC | PRN

## 2016-12-04 MED ORDER — FENTANYL CITRATE (PF) 100 MCG/2ML IJ SOLN
INTRAMUSCULAR | Status: DC | PRN
Start: 2016-12-04 — End: 2016-12-04
  Administered 2016-12-04: 50 ug via INTRAVENOUS

## 2016-12-04 MED ORDER — SODIUM CHLORIDE 0.9 % IV SOLN: 250.0000 mL | INTRAVENOUS | Status: DC | PRN

## 2016-12-04 MED ORDER — INSULIN ASPART 100 UNIT/ML ~~LOC~~ SOLN
0.0000 [IU] | Freq: Three times a day (TID) | SUBCUTANEOUS | Status: DC
  Administered 2016-12-05 (×2): 3 [IU] via SUBCUTANEOUS
  Administered 2016-12-05: 2 [IU] via SUBCUTANEOUS
  Administered 2016-12-06: 7 [IU] via SUBCUTANEOUS
  Administered 2016-12-06: 2 [IU] via SUBCUTANEOUS
  Filled 2016-12-04: qty 2
  Filled 2016-12-04: qty 3
  Filled 2016-12-04: qty 2
  Filled 2016-12-04: qty 7
  Filled 2016-12-04: qty 3

## 2016-12-04 MED ORDER — SODIUM CHLORIDE 0.9% FLUSH: 3.0000 mL | INTRAVENOUS | Status: DC | PRN

## 2016-12-04 MED ORDER — HEPARIN SODIUM (PORCINE) 1000 UNIT/ML IJ SOLN
INTRAMUSCULAR | Status: DC | PRN
  Administered 2016-12-04: 4000 [IU] via INTRAVENOUS

## 2016-12-04 MED ORDER — FERROUS SULFATE 325 (65 FE) MG PO TABS
325.0000 mg | ORAL_TABLET | Freq: Two times a day (BID) | ORAL | Status: DC
  Administered 2016-12-05 – 2016-12-06 (×3): 325 mg via ORAL
  Filled 2016-12-04 (×3): qty 1

## 2016-12-04 MED ORDER — HEPARIN SODIUM (PORCINE) 1000 UNIT/ML IJ SOLN
INTRAMUSCULAR | Status: AC
  Filled 2016-12-04: qty 1

## 2016-12-04 MED ORDER — ONDANSETRON HCL 4 MG/2ML IJ SOLN: 4.0000 mg | Freq: Four times a day (QID) | INTRAMUSCULAR | Status: DC | PRN

## 2016-12-04 MED ORDER — VERAPAMIL HCL 2.5 MG/ML IV SOLN
INTRAVENOUS | Status: AC
  Filled 2016-12-04: qty 2

## 2016-12-04 MED ORDER — FENTANYL CITRATE (PF) 100 MCG/2ML IJ SOLN
INTRAMUSCULAR | Status: AC
  Filled 2016-12-04: qty 2

## 2016-12-04 MED ORDER — ENOXAPARIN SODIUM 40 MG/0.4ML ~~LOC~~ SOLN
40.0000 mg | SUBCUTANEOUS | Status: DC
  Administered 2016-12-05 – 2016-12-06 (×2): 40 mg via SUBCUTANEOUS
  Filled 2016-12-04 (×2): qty 0.4

## 2016-12-04 MED ORDER — ACETAMINOPHEN 325 MG PO TABS: 650.0000 mg | ORAL_TABLET | ORAL | Status: DC | PRN

## 2016-12-04 MED ORDER — MIDAZOLAM HCL 2 MG/2ML IJ SOLN
INTRAMUSCULAR | Status: AC
  Filled 2016-12-04: qty 2

## 2016-12-04 MED ORDER — NITROGLYCERIN 0.4 MG SL SUBL
SUBLINGUAL_TABLET | SUBLINGUAL | Status: AC
  Administered 2016-12-04: 0.4 mg
  Filled 2016-12-04: qty 3

## 2016-12-04 MED ORDER — IOPAMIDOL (ISOVUE-300) INJECTION 61%
INTRAVENOUS | Status: DC | PRN
  Administered 2016-12-04: 60 mL

## 2016-12-04 MED ORDER — HEPARIN SODIUM (PORCINE) 5000 UNIT/ML IJ SOLN: 4000.0000 [IU] | Freq: Once | INTRAMUSCULAR | Status: DC

## 2016-12-04 MED ORDER — CARVEDILOL 3.125 MG PO TABS
3.1250 mg | ORAL_TABLET | Freq: Two times a day (BID) | ORAL | Status: DC
  Administered 2016-12-04 – 2016-12-06 (×4): 3.125 mg via ORAL
  Filled 2016-12-04 (×4): qty 1

## 2016-12-04 MED ORDER — MIDAZOLAM HCL 2 MG/2ML IJ SOLN
INTRAMUSCULAR | Status: DC | PRN
  Administered 2016-12-04: 1 mg via INTRAVENOUS

## 2016-12-04 MED ORDER — ASPIRIN 81 MG PO CHEW
324.0000 mg | CHEWABLE_TABLET | Freq: Once | ORAL | Status: AC
  Administered 2016-12-04: 324 mg via ORAL

## 2016-12-04 MED ORDER — HEPARIN BOLUS VIA INFUSION
4000.0000 [IU] | Freq: Once | INTRAVENOUS | Status: DC
  Filled 2016-12-04: qty 4000

## 2016-12-04 MED ORDER — FERUMOXYTOL INJECTION 510 MG/17 ML
510.0000 mg | Freq: Once | INTRAVENOUS | Status: AC
  Administered 2016-12-05: 510 mg via INTRAVENOUS
  Filled 2016-12-04: qty 17

## 2016-12-04 MED ORDER — SODIUM CHLORIDE 0.9% FLUSH
3.0000 mL | Freq: Two times a day (BID) | INTRAVENOUS | Status: DC
  Administered 2016-12-04 – 2016-12-06 (×4): 3 mL via INTRAVENOUS

## 2016-12-04 MED ORDER — PANTOPRAZOLE SODIUM 40 MG PO TBEC
40.0000 mg | DELAYED_RELEASE_TABLET | Freq: Every day | ORAL | Status: DC
  Administered 2016-12-05 – 2016-12-06 (×2): 40 mg via ORAL
  Filled 2016-12-04 (×2): qty 1

## 2016-12-04 MED ORDER — INSULIN ASPART 100 UNIT/ML ~~LOC~~ SOLN
0.0000 [IU] | Freq: Every day | SUBCUTANEOUS | Status: DC
  Administered 2016-12-04: 5 [IU] via SUBCUTANEOUS
  Administered 2016-12-05: 2 [IU] via SUBCUTANEOUS
  Filled 2016-12-04: qty 2
  Filled 2016-12-04: qty 5

## 2016-12-04 MED ORDER — SODIUM CHLORIDE 0.9 % IV BOLUS (SEPSIS)
1000.0000 mL | Freq: Once | INTRAVENOUS | Status: AC
  Administered 2016-12-04: 1000 mL via INTRAVENOUS

## 2016-12-04 MED ORDER — VERAPAMIL HCL 2.5 MG/ML IV SOLN
INTRAVENOUS | Status: DC | PRN
  Administered 2016-12-04: 2.5 mg via INTRA_ARTERIAL

## 2016-12-04 MED ORDER — INSULIN ASPART 100 UNIT/ML ~~LOC~~ SOLN: 0.0000 [IU] | Freq: Three times a day (TID) | SUBCUTANEOUS | Status: DC

## 2016-12-04 MED ORDER — FUROSEMIDE 10 MG/ML IJ SOLN
40.0000 mg | Freq: Two times a day (BID) | INTRAMUSCULAR | Status: DC
  Administered 2016-12-04 – 2016-12-06 (×4): 40 mg via INTRAVENOUS
  Filled 2016-12-04 (×4): qty 4

## 2016-12-04 SURGICAL SUPPLY — 8 items
CATH 5F 110X4 TIG (CATHETERS) ×2 IMPLANT
CATH 5FR PIGTAIL DIAGNOSTIC (CATHETERS) ×2 IMPLANT
DEVICE INFLAT 30 PLUS (MISCELLANEOUS) ×2 IMPLANT
DEVICE RAD TR BAND REGULAR (VASCULAR PRODUCTS) ×2 IMPLANT
GLIDESHEATH SLEND SS 6F .021 (SHEATH) ×2 IMPLANT
KIT MANI 3VAL PERCEP (MISCELLANEOUS) ×2 IMPLANT
PACK CARDIAC CATH (CUSTOM PROCEDURE TRAY) ×2 IMPLANT
WIRE ROSEN-J .035X260CM (WIRE) ×2 IMPLANT

## 2016-12-04 NOTE — ED Notes (Signed)
Dr Joya Martyr in room called pt code stemi - charge nurse notified

## 2016-12-04 NOTE — Progress Notes (Signed)
ANTICOAGULATION CONSULT NOTE - Initial Consult  Pharmacy Consult for heparin Drip Indication: chest pain/ACS  Allergies  Allergen Reactions  . Mobic [Meloxicam] Other (See Comments)    Ulcers in stomach eruption   . Diclofenac Other (See Comments)    Fast heart beat,, CP,SOB and weakness on one side.    Patient Measurements: Height: 6\' 1"  (185.4 cm) Weight: 185 lb (83.9 kg) IBW/kg (Calculated) : 79.9 Vital Signs: Temp: 99.1 F (37.3 C) (03/13 1753) Temp Source: Oral (03/13 1753) BP: 134/76 (03/13 1753) Pulse Rate: 93 (03/13 1753)  Labs:  Recent Labs  12/03/16 0915 12/04/16 1750  HGB 7.8* 8.6*  HCT 23.9* 26.3*  PLT 319 377    Estimated Creatinine Clearance: 105.3 mL/min (by C-G formula based on SCr of 0.98 mg/dL).   Medical History: Past Medical History:  Diagnosis Date  . Diabetes mellitus type I ([place])   . Slipped intervertebral disc    L4 L5    Assessment: 46 yo male with ACS STEMI. Pharmacy consulted for heparin drip monitoring and dosing.   Goal of Therapy:  Heparin level 0.3-0.7 units/ml Monitor platelets by anticoagulation protocol: Yes   Plan:  Baseline labs ordered Give 4000 units bolus x 1 Start heparin infusion at 1000 units/hr Check anti-Xa level in 6 hours and daily while on heparin Continue to monitor H&H and platelets  Pernell Dupre, PharmD, BCPS Clinical Pharmacist 12/04/2016 6:22 PM

## 2016-12-04 NOTE — ED Notes (Signed)
Pt reports he is finding it easier to breath after the ntg and that the chest pain is resolved at this time

## 2016-12-04 NOTE — ED Notes (Signed)
Cath lab ready for procedure - cardiologist says to transport

## 2016-12-04 NOTE — ED Notes (Signed)
Cardiologist at bedside.  

## 2016-12-04 NOTE — ED Notes (Signed)
Pads placed on pt - crash cart at bedside

## 2016-12-04 NOTE — ED Triage Notes (Signed)
Pt c/o intermittent left chest pain worse with exertion. Chest pain eases with rest. Has also had some SHOB.  Denies fevers. Has had a mild cough per pt.  NAD. Respirations unlabored

## 2016-12-04 NOTE — Consult Note (Signed)
Cardiology Consultation Note    Patient ID: MACALLISTER ASHMEAD, MRN: [id number], DOB/AGE: 26-Feb-1971 46 y.o. Admit date: 12/04/2016   Date of Consult: 12/04/2016 Primary Physician: Eliezer Lofts, MD Primary Cardiologist: Biagio Borg, MD  Chief Complaint: Chest pain and shortness of breath Reason for Consultation: STEMI Requesting MD: Rudene Re, MD   HPI: Philip Ponce is a 46 y.o. male with history of  cardiomyopathy, diabetes mellitus (reportedly type I but on oral agents), gastric bypass, and recent acute blood loss anemia, whom we have been asked to evaluate due to chest pain and shortness of breath with possible STEMI. Patient first noticed melena and hematochezia beginning on 11/20/16 while in [place]. He was evaluated in the ED there and noted to have a low hemoglobin. He was referred for upper and lower endoscopy, which were performed yesterday and showed no obvious cause of bleeding. However, over the last 2-3 days, the patient has also had intermittent chest tightness with accompanying shortness of breath as well as orthopnea. The chest discomfort and breathing became significantly worse this afternoon, prompting him to come to the emergency department. Here he, he was noted to have EKG with left bundle branch block, new compared with prior tracings here. Chest pain and dyspnea improved but did not completely resolve with sublingual nitroglycerin. Hemoglobin was stable from check yesterday. He was referred for emergent left heart catheterization, which revealed no significant CAD. LVEDP was mildly elevated with moderate to severely reduced LV contraction. At the [place] of the procedure, the patient was without chest pain and breathing comfortably.  Patient was evaluated in [place] shortly after his gastric bypass surgery and found to have a decreased EF. Cardiac catheterization at that time reportedly showed no significant CAD. Patient has not followed with cardiology regularly  since then and has not been taking any cardiac medications.  Past Medical History:  Diagnosis Date  . Acute blood loss anemia 11/20/2016  . CHF (congestive heart failure) ([place])   . Diabetes mellitus type I ([place])   . Slipped intervertebral disc    L4 L5      Surgical History:  Past Surgical History:  Procedure Laterality Date  . COLONOSCOPY WITH PROPOFOL N/A 12/03/2016   Procedure: COLONOSCOPY WITH PROPOFOL;  Surgeon: [name], MD;  Location: [facility] ENDOSCOPY;  Service: Endoscopy;  Laterality: N/A;  . ESOPHAGOGASTRODUODENOSCOPY (EGD) WITH PROPOFOL N/A 12/03/2016   Procedure: ESOPHAGOGASTRODUODENOSCOPY (EGD) WITH PROPOFOL;  Surgeon: [name], MD;  Location: [facility] ENDOSCOPY;  Service: Endoscopy;  Laterality: N/A;  . GASTRIC BYPASS       Home Meds: Prior to Admission medications   Medication Sig Start Date Nathanyel Defenbaugh Date Taking? Authorizing Provider  aspirin 81 MG tablet Take 81 mg by mouth daily.   Yes Historical Provider, MD  calcium carbonate (OS-CAL) 600 MG TABS tablet Take 600 mg by mouth 2 (two) times daily with a meal.   Yes Historical Provider, MD  cyclobenzaprine (FLEXERIL) 10 MG tablet Take 5-10 mg by mouth at bedtime as needed for muscle spasms.   Yes Historical Provider, MD  diclofenac (VOLTAREN) 75 MG EC tablet Take 75 mg by mouth 2 (two) times daily.   Yes Historical Provider, MD  ferrous sulfate 325 (65 FE) MG tablet Take 1 tablet (325 mg total) by mouth 2 (two) times daily with a meal. 11/27/16  Yes Amy E Bedsole, MD  glipiZIDE (GLUCOTROL XL) 10 MG 24 hr tablet Take 1 tablet (10 mg total) by mouth daily with breakfast. 01/13/16  Yes  [name], MD  metFORMIN (GLUCOPHAGE-XR) 500 MG 24 hr tablet Take 2 tablets (1,000 mg total) by mouth daily with breakfast. 01/13/16  Yes Amy Cletis [place], MD  Multiple Vitamins-Minerals (MENS MULTI VITAMIN & MINERAL PO) Take 2 tablets by mouth daily. Men's Vita Fusion   Yes Historical Provider, MD  Omega-3 Fatty Acids (FISH OIL) 1200 MG CAPS Take 1  capsule by mouth daily.   Yes Historical Provider, MD  glucose blood (ONE TOUCH ULTRA TEST) test strip Check blood sugar once daily and as instructed. Dx E11.9 01/19/16   [name], MD  omeprazole (PRILOSEC) 40 MG capsule Take 1 capsule (40 mg total) by mouth daily. Patient not taking: Reported on 12/04/2016 11/23/16 12/23/16  Tonia Ghent, MD    Inpatient Medications:  . heparin  4,000 Units Intravenous Once   . heparin      Allergies:  Allergies  Allergen Reactions  . Mobic [Meloxicam] Other (See Comments)    Ulcers in stomach eruption   . Diclofenac Other (See Comments)    Fast heart beat,, CP,SOB and weakness on one side.    Social History   Social History  . Marital status: Married    Spouse name: N/A  . Number of children: N/A  . Years of education: N/A   Occupational History  . 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 Smoking status: Never Smoker  . Smokeless tobacco: Never Used  . Alcohol use No  . Drug use: No  . Sexual activity: Not on file   Other Topics Concern  . Not on file   Social History Narrative   Regular exercise-yes, walking one mile per day   Diet: fast food, diet soda, unsweeted tea     Family History  Problem Relation Age of Onset  . Diabetes Mother   . Hypertension Mother   . Cancer Mother     breast  . Diabetes Father   . Hypertension Father   . Cancer Father     colon  . Cancer Maternal Grandfather     prostate  . Stroke Maternal Grandfather     CVA     Review of Systems: A 12-system review of systems was performed and is negative except as noted in the HPI.  Labs:  Recent Labs  12/04/16 1750  TROPONINI <0.03   Lab Results  Component Value Date   WBC 6.3 12/04/2016   HGB 8.6 (L) 12/04/2016   HCT 26.3 (L) 12/04/2016   MCV 85.4 12/04/2016   PLT 377 12/04/2016     Recent Labs Lab 12/04/16 1750  NA 136  K 4.1  CL 104  CO2 26  BUN 11  CREATININE 0.80  CALCIUM  8.8*  GLUCOSE 228*   Lab Results  Component Value Date   CHOL 121 10/07/2015   HDL 66.70 10/07/2015   LDLCALC 39 10/07/2015   TRIG 78.0 10/07/2015   No results found for: DDIMER  Radiology/Studies:  Dg Chest Portable 1 View  Result Date: 12/04/2016 CLINICAL DATA:  Patient reports SOB and CP. Hx DM. Non-smoker. EXAM: PORTABLE CHEST 1 VIEW COMPARISON:  01/31/2016 FINDINGS: External pacer/defibrillator. Midline trachea. Borderline cardiomegaly. No pleural effusion or pneumothorax. Low lung volumes. Mild pulmonary interstitial prominence and indistinctness. No lobar consolidation. IMPRESSION: Borderline cardiomegaly with mild pulmonary venous congestion. Electronically Signed   By: Abigail Miyamoto M.D.   On: 12/04/2016 18:34    Wt Readings from Last 3 Encounters:  12/04/16  185 lb (83.9 kg)  12/03/16 185 lb (83.9 kg)  11/28/16 185 lb 3.2 oz (84 kg)   EKG (12/04/16 @ 1657): Normal sinus rhythm with possible left atrial enlargement and left bundle branch block. Compared with previous tracing from 01/31/16, left bundle branch block is new (I personally reviewed both tracings).  Physical Exam: Blood pressure (!) 142/99, pulse 96, temperature 99.1 F (37.3 C), temperature source Oral, resp. rate 13, height 6\' 1"  (1.854 m), weight 185 lb (83.9 kg), SpO2 100 %. Body mass index is 24.41 kg/m. General: Well developed, well-nourished man lying in bed. He appears slightly anxious and pale. Head: Normocephalic, atraumatic, sclera non-icteric. Conjunctival pallor noted. Poor dentition.  Neck: Negative for carotid bruits. JVD not elevated. Lungs: Clear bilaterally to auscultation without wheezes, rales, or rhonchi. Breathing is unlabored. Heart: RRR with S1 and S2. S3 also present. No murmurs, rubs, or gallops appreciated. Abdomen: Soft, non-tender, non-distended with normoactive bowel sounds. No hepatomegaly. No rebound/guarding. No obvious abdominal masses. Msk:  Strength and tone appear normal for  age. Extremities: No clubbing or cyanosis. No edema.  Distal pedal pulses are 2+ and equal bilaterally. Neuro: Alert and oriented X 3. No facial asymmetry. No focal deficit. Moves all extremities spontaneously. Psych:  Responds to questions appropriately with a normal affect.    Assessment and Plan  46 year old man with history of systolic heart failure (nonischemic per outside reports), diabetes mellitus, recent acute blood loss anemia, and history of gastric bypass surgery, admitted with chest pain and shortness of breath over the last 2-3 days and left bundle branch block, new since 01/2016. Catheterization showed moderate severe LV dysfunction with no significant coronary artery disease.  Acute on chronic systolic heart failure secondary to nonischemic cardiomyopathy Patient appears euvolemic but has mildly elevated LVEDP. I suspect his dyspnea and chest pain were likely combination of acutely decompensated heart failure and anemia from recent acute blood loss.  Admit to ICU post cath on the hospitalist service.  Initiate carvedilol 3.125 mg twice a day.  Avoid hydration overnight; if renal function remained stable, initiate gentle diuresis in the morning.  If renal function and blood pressure allow, consider adding losartan tomorrow morning with consideration for conversion to Entresto in the future.  Obtain transthoracic echocardiogram tomorrow.  Diabetes mellitus Patient reportedly has type 1 diabetes mellitus, though he is currently prescribed oral agents. Hemoglobin A1c on 11/27/16 was quite elevated at 9.6.  Continue glipizide; hold metformin for 48 hours post cath.  Sliding scale insulin.  Further management per internal medicine.  Acute blood loss anemia Hemoglobin has been stable since recent ED visit at [facility]. Upper and lower endoscopy performed yesterday without clear etiology for bleed.  Avoid NSAIDs and aspirin, as patient does not have indication for either.  Continue  iron supplementation.  History of gastric bypass  Per internal medicine.  Verlan Friends Hera Celaya MD 12/04/2016, 7:49 PM Pager: [phone]

## 2016-12-04 NOTE — ED Provider Notes (Signed)
[facility] Emergency Department Provider Note  ____________________________________________  Time seen: Approximately 6:41 PM  I have reviewed the triage vital signs and the nursing notes.   HISTORY  Chief Complaint Chest Pain   HPI Philip Ponce is a 46 y.o. male with a history of type 1 diabetes, gastric bypass surgery, recent GI bleed who presents for evaluation of chest pain and shortness of breath. Patient reports for the last 2-3 days he has had intermittent episodes of chest tightness associated with shortness of breath. He feels like he is drowning when he lays flat. Shortness of breath is also worse with exertion. Patient had a more severe episode of chest tightness which prompted his visit to the emergency room. His pain is centrally located, tightness in quality, mild at this time, nonradiating, associated with significant shortness of breath. Patient denies paresthesias of his extremities, pain does not radiate to his back. No URI symptoms. Patient denies smoking. Patient denies family history of ischemic heart disease. Of note patient underwent endoscopy and colonoscopy yesterday which were both unremarkable.  Past Medical History:  Diagnosis Date  . Diabetes mellitus type I ([place])   . Slipped intervertebral disc    L4 L5    Patient Active Problem List   Diagnosis Date Noted  . Gastrointestinal hemorrhage associated with gastritis   . Acute epigastric pain 11/27/2016  . Acute blood loss anemia 11/27/2016  . Melena 11/25/2016  . History of bariatric surgery 11/20/2016  . Trapezius strain, left, initial encounter 11/15/2016  . Shoulder pain, left 01/13/2016  . Toenail fungus 10/14/2015  . H/O gastric bypass 04/07/2015  . RBBB 04/07/2015  . Abnormal finding on thallium stress test 09/25/2013  . Congestive heart failure ([place]) 09/25/2013  . Elevated troponin I level 09/25/2013  . Diabetes mellitus ([place]) 09/24/2013  . Hypomagnesemia  09/24/2013  . Block, bundle branch, left 09/24/2013  . Adiposity 09/24/2013  . Obstructive apnea 09/24/2013  . GERD 09/29/2010  . 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 Essential hypertension, benign 07/09/2007  . HYPERCHOLESTEROLEMIA, PURE 06/12/2007  . Diabetes mellitus with no complication ([place]) 18/56/3149    Past Surgical History:  Procedure Laterality Date  . COLONOSCOPY WITH PROPOFOL N/A 12/03/2016   Procedure: COLONOSCOPY WITH PROPOFOL;  Surgeon: [name], MD;  Location: [facility] ENDOSCOPY;  Service: Endoscopy;  Laterality: N/A;  . ESOPHAGOGASTRODUODENOSCOPY (EGD) WITH PROPOFOL N/A 12/03/2016   Procedure: ESOPHAGOGASTRODUODENOSCOPY (EGD) WITH PROPOFOL;  Surgeon: [name], MD;  Location: [facility] ENDOSCOPY;  Service: Endoscopy;  Laterality: N/A;  . GASTRIC BYPASS      Prior to Admission medications   Medication Sig Start Date End Date Taking? Authorizing Provider  aspirin 81 MG tablet Take 81 mg by mouth daily.   Yes Historical Provider, MD  calcium carbonate (OS-CAL) 600 MG TABS tablet Take 600 mg by mouth 2 (two) times daily with a meal.   Yes Historical Provider, MD  cyclobenzaprine (FLEXERIL) 10 MG tablet Take 5-10 mg by mouth at bedtime as needed for muscle spasms.   Yes Historical Provider, MD  diclofenac (VOLTAREN) 75 MG EC tablet Take 75 mg by mouth 2 (two) times daily.   Yes Historical Provider, MD  ferrous sulfate 325 (65 FE) MG tablet Take 1 tablet (325 mg total) by mouth 2 (two) times daily with a meal. 11/27/16  Yes Amy E Bedsole, MD  glipiZIDE (GLUCOTROL XL) 10 MG 24 hr tablet Take 1 tablet (10 mg total) by mouth daily with breakfast. 01/13/16  Yes Amy E Bedsole,  MD  metFORMIN (GLUCOPHAGE-XR) 500 MG 24 hr tablet Take 2 tablets (1,000 mg total) by mouth daily with breakfast. 01/13/16  Yes Amy Cletis [place], MD  Multiple Vitamins-Minerals (MENS MULTI VITAMIN & MINERAL PO) Take 2 tablets by mouth daily. Men's Vita Fusion   Yes Historical Provider, MD  Omega-3 Fatty Acids (FISH  OIL) 1200 MG CAPS Take 1 capsule by mouth daily.   Yes Historical Provider, MD  glucose blood (ONE TOUCH ULTRA TEST) test strip Check blood sugar once daily and as instructed. Dx E11.9 01/19/16   [name], MD  omeprazole (PRILOSEC) 40 MG capsule Take 1 capsule (40 mg total) by mouth daily. Patient not taking: Reported on 12/04/2016 11/23/16 12/23/16  Tonia Ghent, MD    Allergies Mobic [meloxicam] and Diclofenac  Family History  Problem Relation Age of Onset  . Diabetes Mother   . Hypertension Mother   . Cancer Mother     breast  . Diabetes Father   . Hypertension Father   . Cancer Father     colon  . Cancer Maternal Grandfather     prostate  . Stroke Maternal Grandfather     CVA    Social History Social History  Substance Use Topics  . Smoking status: Never Smoker  . Smokeless tobacco: Never Used  . Alcohol use No    Review of Systems  Constitutional: Negative for fever. Eyes: Negative for visual changes. ENT: Negative for sore throat. Neck: No neck pain  Cardiovascular: + chest pain. Respiratory: + shortness of breath. Gastrointestinal: Negative for abdominal pain, vomiting or diarrhea. Genitourinary: Negative for dysuria. Musculoskeletal: Negative for back pain. Skin: Negative for rash. Neurological: Negative for headaches, weakness or numbness. Psych: No SI or HI  ____________________________________________   PHYSICAL EXAM:  VITAL SIGNS: ED Triage Vitals  Enc Vitals Group     BP 12/04/16 1753 134/76     Pulse Rate 12/04/16 1753 93     Resp 12/04/16 1753 16     Temp 12/04/16 1753 99.1 F (37.3 C)     Temp Source 12/04/16 1753 Oral     SpO2 12/04/16 1753 100 %     Weight 12/04/16 1751 185 lb (83.9 kg)     Height 12/04/16 1751 6\' 1"  (1.854 m)     Head Circumference --      Peak Flow --      Pain Score 12/04/16 1752 5     Pain Loc --      Pain Edu? --      Excl. in [place]? --     Constitutional: Alert and oriented. Well appearing and in no  apparent distress. HEENT:      Head: Normocephalic and atraumatic.         Eyes: Conjunctivae are normal. Sclera is non-icteric. EOMI. PERRL      Mouth/Throat: Mucous membranes are moist.       Neck: Supple with no signs of meningismus. Cardiovascular: Regular rate and rhythm. No murmurs, gallops, or rubs. 2+ symmetrical distal pulses are present in all extremities. No JVD. Respiratory: Normal respiratory effort. Lungs are clear to auscultation bilaterally. No wheezes, crackles, or rhonchi.  Gastrointestinal: Soft, non tender, and non distended with positive bowel sounds. No rebound or guarding. Genitourinary: No CVA tenderness. Musculoskeletal: Nontender with normal range of motion in all extremities. No edema, cyanosis, or erythema of extremities. Neurologic: Normal speech and language. Face is symmetric. Moving all extremities. No gross focal neurologic deficits are appreciated. Skin: Skin is warm,  dry and intact. No rash noted. Psychiatric: Mood and affect are normal. Speech and behavior are normal.  ____________________________________________   LABS (all labs ordered are listed, but only abnormal results are displayed)  Labs Reviewed  BASIC METABOLIC PANEL - Abnormal; Notable for the following:       Result Value   Glucose, Bld 228 (*)    Calcium 8.8 (*)    All other components within normal limits  CBC - Abnormal; Notable for the following:    RBC 3.08 (*)    Hemoglobin 8.6 (*)    HCT 26.3 (*)    All other components within normal limits  TROPONIN I  APTT  PROTIME-INR   ____________________________________________  EKG  ED ECG REPORT I, Rudene Re, the attending physician, personally viewed and interpreted this ECG.   Normal sinus rhythm, rate of 96, left bundle branch block, discordant ST elevations anterior leads with mild ST depressions on inferior and lateral leads. All these changes are new compared to prior.  18:10 - sinus tachycardia, rate of 104, left  bundle branch block, persistent discordant ST elevation in anterior leads with worsening ST depressions on inferior and lateral leads ____________________________________________  RADIOLOGY  CXR: Borderline cardiomegaly with mild pulmonary venous congestion ____________________________________________   PROCEDURES  Procedure(s) performed: None Procedures Critical Care performed: yes  CRITICAL CARE Performed by: Rudene Re  ?  Total critical care time: 35 min  Critical care time was exclusive of separately billable procedures and treating other patients.  Critical care was necessary to treat or prevent imminent or life-threatening deterioration.  Critical care was time spent personally by me on the following activities: development of treatment plan with patient and/or surrogate as well as nursing, discussions with consultants, evaluation of patient's response to treatment, examination of patient, obtaining history from patient or surrogate, ordering and performing treatments and interventions, ordering and review of laboratory studies, ordering and review of radiographic studies, pulse oximetry and re-evaluation of patient's condition.  ____________________________________________   INITIAL IMPRESSION / ASSESSMENT AND PLAN / ED COURSE  46 y.o. male with a history of type 1 diabetes, gastric bypass surgery, recent GI bleed who presents for evaluation of chest pain and shortness of breath. Initial EKG concerning for new left bundle-branch block with discordant ST elevations which showed progression and worsening reciprocal changes on EKG done 10 minutes later. Code STEMI was called. Patient was given a full dose of aspirin. He was given one sublingual nitroglycerin with resolution of his chest pain and improvement of his shortness of breath. Heparin was held due to recent GI bleed. Patient was evaluated by Dr. Saunders Revel, cardiologist on call and taken to cath lab.     Pertinent  labs & imaging results that were available during my care of the patient were reviewed by me and considered in my medical decision making (see chart for details).    ____________________________________________   FINAL CLINICAL IMPRESSION(S) / ED DIAGNOSES  Final diagnoses:  ST elevation myocardial infarction (STEMI), unspecified artery ([place])      NEW MEDICATIONS STARTED DURING THIS VISIT:  New Prescriptions   No medications on file     Note:  This document was prepared using Dragon voice recognition software and may include unintentional dictation errors.    Rudene Re, MD 12/04/16 [phone]

## 2016-12-04 NOTE — ED Notes (Signed)
Pt c/o shortness of breath and chest pressure

## 2016-12-04 NOTE — ED Notes (Signed)
EKG machine in triage one not working, getting a new machine

## 2016-12-04 NOTE — Telephone Encounter (Signed)
Pt has appt with Dr Deborra Medina on 12/05/16 at [facility]

## 2016-12-04 NOTE — Telephone Encounter (Signed)
[facility]  Patient Name: Philip Ponce  DOB: 01-14-1971    Initial Comment Caller's husband has shortness of breath for a few days.    Nurse Assessment  Nurse: Harlow Mares, RN, Suanne Marker Date/Time (Eastern Time): 12/04/2016 4:40:01 PM  Confirm and document reason for call. If symptomatic, describe symptoms. ---Caller's husband has shortness of breath for a few days. Reports that he had blood stool recently and he is anemic. He is now SOB.  Does the patient have any new or worsening symptoms? ---Yes  Will a triage be completed? ---Yes  Related visit to physician within the last 2 weeks? ---Yes  Does the PT have any chronic conditions? (i.e. diabetes, asthma, etc.) ---Yes  List chronic conditions. ---acute anemia; recent GI bleed; hx weight loss surgery; hx CHF  Is this a behavioral health or substance abuse call? ---No     Guidelines    Guideline Title Affirmed Question Affirmed Notes  Breathing Difficulty [1] MILD difficulty breathing (e.g., minimal/no SOB at rest, SOB with walking, pulse <100) AND [2] NEW-onset or WORSE than normal    Final Disposition User   See Physician within 4 Hours (or PCP triage) Harlow Mares, RN, [place]    Comments  Caller reports that she will try to get the patient to be seen tonight at [facility] but she asked that nurse make appt for tomorrow in case he refuses care tonight. Made appt at 9am tomorrow with Dr. Deborra Medina at the [facility] location. Caller reports that she will call and cancel if patient is seen in [facility].   Referrals  GO TO FACILITY UNDECIDED   Disagree/Comply: Comply

## 2016-12-04 NOTE — Telephone Encounter (Signed)
Done

## 2016-12-04 NOTE — H&P (Signed)
[place] [facility] [place] NAME: Philip Ponce    MR#:  [id number]  DATE OF BIRTH:  06/14/1971  DATE OF ADMISSION:  12/04/2016  PRIMARY CARE PHYSICIAN: Eliezer Lofts, MD   REQUESTING/REFERRING PHYSICIAN: Dr. Saunders Revel  CHIEF COMPLAINT:   Chief Complaint  Patient presents with  . Chest Pain    HISTORY OF PRESENT ILLNESS:  Philip Ponce  is a 46 y.o. male with a known history of Diabetes, cardiomyopathy with ejection fraction 35%, iron deficiency anemia, hypertension presented to the emergency room with chest pain or shortness of breath. His EKG showed left bundle branch block and a code STEMI was activated. Patient was rushed to the cardiac catheterization. His coronaries had been found to be clean. This time he is being admitted to the ICU for acute on chronic systolic congestive heart failure and overnight monitoring. He was also at [facility] recently for some melena. He was scheduled for outpatient colonoscopy and EGD with [name] which have been done and are normal. He is waiting for an outpatient iron infusion at this time and at this month. He does have orthopnea and some dry cough. Feels extremely fatigued. No chest pain at this time. Afebrile.  PAST MEDICAL HISTORY:   Past Medical History:  Diagnosis Date  . Acute blood loss anemia 11/20/2016  . CHF (congestive heart failure) ([place])   . Diabetes mellitus type I ([place])   . Slipped intervertebral disc    L4 L5    PAST SURGICAL HISTORY:   Past Surgical History:  Procedure Laterality Date  . COLONOSCOPY WITH PROPOFOL N/A 12/03/2016   Procedure: COLONOSCOPY WITH PROPOFOL;  Surgeon: [name], MD;  Location: [facility] ENDOSCOPY;  Service: Endoscopy;  Laterality: N/A;  . ESOPHAGOGASTRODUODENOSCOPY (EGD) WITH PROPOFOL N/A 12/03/2016   Procedure: ESOPHAGOGASTRODUODENOSCOPY (EGD) WITH PROPOFOL;  Surgeon: [name], MD;  Location: [facility] ENDOSCOPY;  Service: Endoscopy;  Laterality: N/A;  . GASTRIC BYPASS       SOCIAL HISTORY:   Social History  Substance Use Topics  . Smoking status: Never Smoker  . Smokeless tobacco: Never Used  . Alcohol use No    FAMILY HISTORY:   Family History  Problem Relation Age of Onset  . Diabetes Mother   . Hypertension Mother   . Cancer Mother     breast  . Diabetes Father   . Hypertension Father   . Cancer Father     colon  . Cancer Maternal Grandfather     prostate  . Stroke Maternal Grandfather     CVA    DRUG ALLERGIES:   Allergies  Allergen Reactions  . Mobic [Meloxicam] Other (See Comments)    Ulcers in stomach eruption   . Diclofenac Other (See Comments)    Fast heart beat,, CP,SOB and weakness on one side.    REVIEW OF SYSTEMS:   ROS  MEDICATIONS AT HOME:   Prior to Admission medications   Medication Sig Start Date End Date Taking? Authorizing Provider  aspirin 81 MG tablet Take 81 mg by mouth daily.   Yes Historical Provider, MD  calcium carbonate (OS-CAL) 600 MG TABS tablet Take 600 mg by mouth 2 (two) times daily with a meal.   Yes Historical Provider, MD  cyclobenzaprine (FLEXERIL) 10 MG tablet Take 5-10 mg by mouth at bedtime as needed for muscle spasms.   Yes Historical Provider, MD  diclofenac (VOLTAREN) 75 MG EC tablet Take 75 mg by mouth 2 (two) times daily.   Yes Historical  Provider, MD  ferrous sulfate 325 (65 FE) MG tablet Take 1 tablet (325 mg total) by mouth 2 (two) times daily with a meal. 11/27/16  Yes Amy E Bedsole, MD  glipiZIDE (GLUCOTROL XL) 10 MG 24 hr tablet Take 1 tablet (10 mg total) by mouth daily with breakfast. 01/13/16  Yes Amy E Bedsole, MD  glucose blood (ONE TOUCH ULTRA TEST) test strip Check blood sugar once daily and as instructed. Dx E11.9 01/19/16  Yes Amy Cletis [place], MD  metFORMIN (GLUCOPHAGE-XR) 500 MG 24 hr tablet Take 2 tablets (1,000 mg total) by mouth daily with breakfast. 01/13/16  Yes Amy Cletis [place], MD  Multiple Vitamins-Minerals (MENS MULTI VITAMIN & MINERAL PO) Take 2 tablets by mouth  daily. Men's Vita Fusion   Yes Historical Provider, MD  Omega-3 Fatty Acids (FISH OIL) 1200 MG CAPS Take 1 capsule by mouth daily.   Yes Historical Provider, MD  omeprazole (PRILOSEC) 40 MG capsule Take 1 capsule (40 mg total) by mouth daily. Patient not taking: Reported on 12/04/2016 11/23/16 12/23/16  Tonia Ghent, MD     VITAL SIGNS:  Blood pressure (!) 136/101, pulse 92, temperature 98.7 F (37.1 C), temperature source Oral, resp. rate 15, height 6\' 1"  (1.854 m), weight 79.8 kg (175 lb 14.8 oz), SpO2 100 %.  PHYSICAL EXAMINATION:  Physical Exam  GENERAL:  46 y.o.-year-old patient lying in the bed with no acute distress.  EYES: Pupils equal, round, reactive to light and accommodation. No scleral icterus. Extraocular muscles intact.  HEENT: Head atraumatic, normocephalic. Oropharynx and nasopharynx clear. No oropharyngeal erythema, moist oral mucosa  NECK:  Supple, no jugular venous distention. No thyroid enlargement, no tenderness.  LUNGS: Normal breath sounds bilaterally, no wheezing, rales, rhonchi. No use of accessory muscles of respiration.  CARDIOVASCULAR: S1, S2 normal. No murmurs, rubs, or gallops.  ABDOMEN: Soft, nontender, nondistended. Bowel sounds present. No organomegaly or mass.  EXTREMITIES: No pedal edema, cyanosis, or clubbing. + 2 pedal & radial pulses b/l.   NEUROLOGIC: Cranial nerves II through XII are intact. No focal Motor or sensory deficits appreciated b/l PSYCHIATRIC: The patient is alert and oriented x 3. Good affect.  SKIN: No obvious rash, lesion, or ulcer.   LABORATORY PANEL:   CBC  Recent Labs Lab 12/04/16 1750  WBC 6.3  HGB 8.6*  HCT 26.3*  PLT 377   ------------------------------------------------------------------------------------------------------------------  Chemistries   Recent Labs Lab 12/04/16 1750  NA 136  K 4.1  CL 104  CO2 26  GLUCOSE 228*  BUN 11  CREATININE 0.80  CALCIUM 8.8*    ------------------------------------------------------------------------------------------------------------------  Cardiac Enzymes  Recent Labs Lab 12/04/16 1750  TROPONINI <0.03   ------------------------------------------------------------------------------------------------------------------  RADIOLOGY:  Dg Chest Portable 1 View  Result Date: 12/04/2016 CLINICAL DATA:  Patient reports SOB and CP. Hx DM. Non-smoker. EXAM: PORTABLE CHEST 1 VIEW COMPARISON:  01/31/2016 FINDINGS: External pacer/defibrillator. Midline trachea. Borderline cardiomegaly. No pleural effusion or pneumothorax. Low lung volumes. Mild pulmonary interstitial prominence and indistinctness. No lobar consolidation. IMPRESSION: Borderline cardiomegaly with mild pulmonary venous congestion. Electronically Signed   By: Abigail Miyamoto M.D.   On: 12/04/2016 18:34   IMPRESSION AND PLAN:   * Acute on chronic systolic CHF - IV Lasix, Beta blockers - Input and Output - Counseled to limit fluids and Salt - Monitor Bun/Cr and Potassium - Echo - last echocardiogram reviewed. Repeat echocardiogram ordered. -Cardiology following. Discussed with Dr. Saunders Revel  * Chest pain with left bundle branch block on EKG Cardiac catheterization showed normal  coronaries.  * Iron Deficiency anemia We will give him IV dose of Iron with Feraheme in the morning. He does have follow-up as outpatient for further iron infusions. Hemoglobin stable Recent EGD and colonoscopy by [name] upper normal.  * Diabetes mellitus Sliding scale insulin. Home medications.  * DVT prophylaxis Lovenox  All the records are reviewed and case discussed with ED provider. Management plans discussed with the patient, family and they are in agreement.  CODE STATUS: FULL CODE  TOTAL TIME TAKING CARE OF THIS PATIENT: 40 minutes.   Hillary Bow R M.D on 12/04/2016 at 8:41 PM  Between 7am to 6pm - Pager - [phone]  After 6pm go to [email or web address] - password  EPAS [facility]  [facility] [place] [facility]  Office  [phone]  CC: Primary care physician; Eliezer Lofts, MD  Note: This dictation was prepared with Dragon dictation along with smaller phrase technology. Any transcriptional errors that result from this process are unintentional.

## 2016-12-04 NOTE — Telephone Encounter (Signed)
1st form needs additional information There is a 2nd form to be reviewed and signed In dr [name] box

## 2016-12-05 ENCOUNTER — Ambulatory Visit: Payer: Self-pay | Admitting: Family Medicine

## 2016-12-05 ENCOUNTER — Inpatient Hospital Stay (HOSPITAL_COMMUNITY)
Admit: 2016-12-05 | Discharge: 2016-12-05 | Disposition: A | Discharge status: Home / Self Care | Payer: 59 | Attending: Internal Medicine | Admitting: Internal Medicine

## 2016-12-05 ENCOUNTER — Encounter: Payer: Self-pay | Admitting: Internal Medicine

## 2016-12-05 ENCOUNTER — Telehealth: Payer: Self-pay | Admitting: Family Medicine

## 2016-12-05 DIAGNOSIS — I447 Left bundle-branch block, unspecified: Secondary | ICD-10-CM

## 2016-12-05 DIAGNOSIS — R9431 Abnormal electrocardiogram [ECG] [EKG]: Secondary | ICD-10-CM

## 2016-12-05 DIAGNOSIS — I5023 Acute on chronic systolic (congestive) heart failure: Secondary | ICD-10-CM

## 2016-12-05 DIAGNOSIS — D500 Iron deficiency anemia secondary to blood loss (chronic): Secondary | ICD-10-CM

## 2016-12-05 DIAGNOSIS — I5021 Acute systolic (congestive) heart failure: Secondary | ICD-10-CM

## 2016-12-05 DIAGNOSIS — I428 Other cardiomyopathies: Secondary | ICD-10-CM

## 2016-12-05 DIAGNOSIS — R0602 Shortness of breath: Secondary | ICD-10-CM

## 2016-12-05 LAB — ECHOCARDIOGRAM COMPLETE
HEIGHTINCHES: 73 in
Weight: 2814.83 oz

## 2016-12-05 LAB — GLUCOSE, CAPILLARY
GLUCOSE-CAPILLARY: 186 mg/dL — AB (ref 65–99)
GLUCOSE-CAPILLARY: 173 mg/dL — AB (ref 65–99)
GLUCOSE-CAPILLARY: 242 mg/dL — AB (ref 65–99)
Glucose-Capillary: 212 mg/dL — ABNORMAL HIGH (ref 65–99)
Glucose-Capillary: 237 mg/dL — ABNORMAL HIGH (ref 65–99)

## 2016-12-05 LAB — BASIC METABOLIC PANEL
ANION GAP: 4 — AB (ref 5–15)
BUN: 11 mg/dL (ref 6–20)
CO2: 28 mmol/L (ref 22–32)
Calcium: 8.6 mg/dL — ABNORMAL LOW (ref 8.9–10.3)
Chloride: 109 mmol/L (ref 101–111)
Creatinine, Ser: 0.71 mg/dL (ref 0.61–1.24)
GFR calc Af Amer: >60 mL/min (ref >60)
GFR calc non Af Amer: >60 mL/min (ref >60)
GLUCOSE: 180 mg/dL — AB (ref 65–99)
POTASSIUM: 3.4 mmol/L — AB (ref 3.5–5.1)
Sodium: 141 mmol/L (ref 135–145)

## 2016-12-05 LAB — CBC
HEMATOCRIT: 24.1 % — AB (ref 40.0–52.0)
HEMOGLOBIN: 8.1 g/dL — AB (ref 13.0–18.0)
MCH: 28.4 pg (ref 26.0–34.0)
MCHC: 33.5 g/dL (ref 32.0–36.0)
MCV: 84.6 fL (ref 80.0–100.0)
Platelets: 349 10*3/uL (ref 150–440)
RBC: 2.85 MIL/uL — AB (ref 4.40–5.90)
RDW: 13.8 % (ref 11.5–14.5)
WBC: 4.9 10*3/uL (ref 3.8–10.6)

## 2016-12-05 LAB — MAGNESIUM: Magnesium: 1.5 mg/dL — ABNORMAL LOW (ref 1.7–2.4)

## 2016-12-05 MED ORDER — POTASSIUM CHLORIDE CRYS ER 20 MEQ PO TBCR
30.0000 meq | EXTENDED_RELEASE_TABLET | Freq: Two times a day (BID) | ORAL | Status: AC
  Administered 2016-12-05 (×2): 30 meq via ORAL
  Filled 2016-12-05: qty 1
  Filled 2016-12-05: qty 2

## 2016-12-05 MED ORDER — MAGNESIUM OXIDE 400 (241.3 MG) MG PO TABS
400.0000 mg | ORAL_TABLET | Freq: Two times a day (BID) | ORAL | Status: DC
  Administered 2016-12-05 – 2016-12-06 (×3): 400 mg via ORAL
  Filled 2016-12-05 (×3): qty 1

## 2016-12-05 MED ORDER — LISINOPRIL 5 MG PO TABS
2.5000 mg | ORAL_TABLET | Freq: Every day | ORAL | Status: DC
  Administered 2016-12-05 – 2016-12-06 (×2): 2.5 mg via ORAL
  Filled 2016-12-05 (×2): qty 1

## 2016-12-05 NOTE — Progress Notes (Signed)
Patient Name: Philip Ponce Date of Encounter: 12/05/2016  Primary Cardiologist: New to [facility] - consult by End (possibly saw Humphrey Rolls in the past)  Hospital Problem List     Principal Problem:   Acute on chronic systolic heart failure ([place]) Active Problems:   LBBB (left bundle branch block)   Non-ischemic cardiomyopathy ([place])   Anemia   ST elevation myocardial infarction (STEMI) ([place])   Iron deficiency anemia   CHF (congestive heart failure) ([place])     Subjective   Feeling much better this morning. No further SOB, feels like he is at his baseline. No further chest pain. LHC without obstructive CAD. EF reduced by LV gram at 30-35%. Prior echo from 03/2015 with EF 50-55%, no RWMA, GR1DD. Patient reports a prior history of cardiomyopathy with reduced EF around the time he had gastric bypass. Cardiac cath at that time reportedly showed no significant CAD. He did briefly see a local cardiologist, though he is not certain who, and was found to have improved EF. He was taken off all cardiac medications at that time per his report.   Patient with recent upper GIB in late February 2018. Upper and lower GI unrevealing. Recently started on iron tabs at home. He did received iron infusion this morning. HGB remains low, though stable.   Potassium low at 3.4.   Echo pending this morning.   Tolerating medications, though BP is soft in the low 528'U systolic.   Inpatient Medications    Scheduled Meds: . carvedilol  3.125 mg Oral BID WC  . enoxaparin (LOVENOX) injection  40 mg Subcutaneous Q24H  . ferrous sulfate  325 mg Oral BID WC  . furosemide  40 mg Intravenous BID  . glipiZIDE  10 mg Oral Q breakfast  . insulin aspart  0-5 Units Subcutaneous QHS  . insulin aspart  0-9 Units Subcutaneous TID WC  . pantoprazole  40 mg Oral Daily  . sodium chloride flush  3 mL Intravenous Q12H   Continuous Infusions:  PRN Meds: sodium chloride, acetaminophen, cyclobenzaprine, ondansetron (ZOFRAN)  IV, sodium chloride flush   Vital Signs    Vitals:   12/05/16 0800 12/05/16 0820 12/05/16 0900 12/05/16 1000  BP: 110/79 110/79 111/72 103/76  Pulse: 94 90 96 91  Resp: 14  16 15   Temp: 98.4 F (36.9 C)     TempSrc: Oral     SpO2: 98%  97% 95%  Weight:      Height:        Intake/Output Summary (Last 24 hours) at 12/05/16 1047 Last data filed at 12/05/16 0755  Gross per 24 hour  Intake              627 ml  Output             2125 ml  Net            -1498 ml   Filed Weights   12/04/16 1751 12/04/16 2000  Weight: 185 lb (83.9 kg) 175 lb 14.8 oz (79.8 kg)    Physical Exam    GEN: Well nourished, well developed, in no acute distress.  HEENT: Grossly normal.  Neck: Supple, no JVD, carotid bruits, or masses. Cardiac: RRR, no murmurs, rubs, or gallops. No clubbing, cyanosis, edema.  Radials/DP/PT 2+ and equal bilaterally.  Respiratory:  Respirations regular and unlabored, clear to auscultation bilaterally. GI: Soft, nontender, nondistended, BS + x 4. MS: no deformity or atrophy. Skin: warm and dry, no rash. Neuro:  Strength  and sensation are intact. Psych: AAOx3.  Normal affect.  Labs    CBC  Recent Labs  12/03/16 0915 12/04/16 1750 12/05/16 0421  WBC 4.2 6.3 4.9  NEUTROABS 2.4  --   --   HGB 7.8* 8.6* 8.1*  HCT 23.9* 26.3* 24.1*  MCV 85.3 85.4 84.6  PLT 319 377 027   Basic Metabolic Panel  Recent Labs  12/04/16 1750 12/05/16 0421  NA 136 141  K 4.1 3.4*  CL 104 109  CO2 26 28  GLUCOSE 228* 180*  BUN 11 11  CREATININE 0.80 0.71  CALCIUM 8.8* 8.6*   Liver Function Tests No results for input(s): AST, ALT, ALKPHOS, BILITOT, PROT, ALBUMIN in the last 72 hours. No results for input(s): LIPASE, AMYLASE in the last 72 hours. Cardiac Enzymes  Recent Labs  12/04/16 1750  TROPONINI <0.03   BNP Invalid input(s): POCBNP D-Dimer No results for input(s): DDIMER in the last 72 hours. Hemoglobin A1C No results for input(s): HGBA1C in the last 72  hours. Fasting Lipid Panel No results for input(s): CHOL, HDL, LDLCALC, TRIG, CHOLHDL, LDLDIRECT in the last 72 hours. Thyroid Function Tests No results for input(s): TSH, T4TOTAL, T3FREE, THYROIDAB in the last 72 hours.  Invalid input(s): FREET3  Telemetry    NSR, 90s bpm - Personally Reviewed  ECG    n/a - Personally Reviewed  Radiology    Dg Chest Portable 1 View  Result Date: 12/04/2016 CLINICAL DATA:  Patient reports SOB and CP. Hx DM. Non-smoker. EXAM: PORTABLE CHEST 1 VIEW COMPARISON:  01/31/2016 FINDINGS: External pacer/defibrillator. Midline trachea. Borderline cardiomegaly. No pleural effusion or pneumothorax. Low lung volumes. Mild pulmonary interstitial prominence and indistinctness. No lobar consolidation. IMPRESSION: Borderline cardiomegaly with mild pulmonary venous congestion. Electronically Signed   By: Abigail Miyamoto M.D.   On: 12/04/2016 18:34    Cardiac Studies   LHC 12/04/16: Conclusion   Conclusions: 1. No angiographically significant coronary artery disease. 2. Moderate to severe LV contractile dysfunction (LVEF 30-35%), consistent with non-ischemic cardiomyopathy. 3. Mildly elevated left ventricular filling pressure.  Recommendations: 1. Admit to hospitalist service for medical optimization of acute on chronic systolic heart failure. 2. Start carvedilol 3.125 mg BID tonight; if blood pressure and renal function allow, consider adding losartan tomorrow morning. 3. Gentle diuresis beginning tomorrow morning. Avoid hydration tonight. 4. TR band protocol.    TTE pending  Patient Profile     46 y.o. male with history of cardiomyopathy, diabetes mellitus (reportedly type I but on oral agents), gastric bypass, and recent acute blood loss anemia who presented to [facility] as a code STEMI. Urgent cardiac cath showed no occlusive CAD with reduced EF of 30-35% by LV gram.   Assessment & Plan    1. Acute on chronic systolic CHF 2/2 NICM: -Breathing much  better -Check echo to evaluate EF and right-side pressure -Continue Coreg 3.125 mg bid (BP precludes fuhhter titration at this time) -If BP allows, add low-dose lisinopril vs Entresto and spironolactone  -Gentle diuresis with low-dose IV Lasix given mildly elevated LVEDP  2. Acute blood loss anemia: -Status post iron infusion this morning -No acute indication for blood transfusion at this time -Will likely need PO iron at home -Low hgb we are seeing now likely related to late February event - Monitor  3. DM: -Per IM -Hold metformin for a total of 48 hours s/p cath  4. Status post gastric bypass: -Outpatient follow up   5. Hypokalemia: -Replete to goal of 4.0 -Check magnesium  Signed, Christell Faith, PA-C [place] Pager: [phone] 12/05/2016, 10:47 AM    Attending Note Patient seen and examined, agree with detailed note above,  Patient presentation and plan discussed on rounds.   Patient reports that he feels well this morning, denies any significant shortness of breath Has not been ambulating out of bed Blood pressure borderline low, denies any lightheadedness Long discussion with patient and wife was at bedside concerning prior history of cardiomyopathy, gastric bypass, weight loss of 100 pounds  On clinical exam, he is alert and oriented, no significant JVD, alert and oriented, PERRLA, lungs clear to auscultation bilaterally, heart sounds regular with 2/6 systolic ejection murmur heard left sternal border, abdomen soft nontender, no significant lower extremity edema, SCDS in place  Lab work reviewed showing low iron of 15 Patient indicates he had recent iron infusion Hematocrit 24, normal renal function  -----Dilated nonischemic cardiomyopathy Ejection fraction 20-25% on echocardiogram, dilated LV Grossly not fluid overloaded with normal IVC, RV size, RV pressures Etiology unclear, denies heavy alcohol Long discussion with patient and wife at the  bedside Currently tolerating low-dose carvedilol, Lasix 40 mg twice a day Recommended we start low-dose lisinopril 2.5 mg daily with slow titration upwards He may benefit from Aldactone starting tomorrow  -----Anemia Recent blood loss from gastric ulcers in February 2018 He has received iron infusion this hospitalization  -----Hypokalemia Also with low magnesium Likely secondary to diuresis Would replete   Greater than 50% was spent in counseling and coordination of care with patient Total encounter time 35 minutes or more   Signed: Esmond Plants  M.D., Ph.D. [facility]

## 2016-12-05 NOTE — Progress Notes (Signed)
*  PRELIMINARY RESULTS* Echocardiogram 2D Echocardiogram has been performed.  Sherrie Sport 12/05/2016, 2:25 PM

## 2016-12-05 NOTE — Progress Notes (Signed)
[place] [facility] [place] NAME: Philip Ponce    MR#:  [id number]  DATE OF BIRTH:  [birth year]  SUBJECTIVE:   Patient here due to chest pain and noted to have a left bundle branch block and is status post cardiac physician with no significant coronary artery disease. Noted to have LV dysfunction on LV gram and is being treated for acute on chronic systolic CHF. Presently denies any shortness of breath, chest pain. Patient's wife is at bedside.  REVIEW OF SYSTEMS:    Review of Systems  Constitutional: Negative for chills and fever.  HENT: Negative for congestion and tinnitus.   Eyes: Negative for blurred vision and double vision.  Respiratory: Negative for cough, shortness of breath and wheezing.   Cardiovascular: Negative for chest pain, orthopnea and PND.  Gastrointestinal: Negative for abdominal pain, diarrhea, nausea and vomiting.  Genitourinary: Negative for dysuria and hematuria.  Neurological: Negative for dizziness, sensory change and focal weakness.  All other systems reviewed and are negative.   Nutrition: Carb modified.  Tolerating Diet: Yes Tolerating PT: Ambulatory  DRUG ALLERGIES:   Allergies  Allergen Reactions  . Mobic [Meloxicam] Other (See Comments)    Ulcers in stomach eruption   . Diclofenac Other (See Comments)    Fast heart beat,, CP,SOB and weakness on one side.    VITALS:  Blood pressure 114/71, pulse 91, temperature 98.7 F (37.1 C), temperature source Oral, resp. rate 14, height 6\' 1"  (1.854 m), weight 79.8 kg (175 lb 14.8 oz), SpO2 99 %.  PHYSICAL EXAMINATION:   Physical Exam  GENERAL:  46 y.o.-year-old patient lying in the bed with no acute distress.  EYES: Pupils equal, round, reactive to light and accommodation. No scleral icterus. Extraocular muscles intact.  HEENT: Head atraumatic, normocephalic. Oropharynx and nasopharynx clear.  NECK:  Supple, no jugular venous distention. No thyroid enlargement,  no tenderness.  LUNGS: Normal breath sounds bilaterally, no wheezing, rales, rhonchi. No use of accessory muscles of respiration.  CARDIOVASCULAR: S1, S2 normal. No murmurs, rubs, or gallops.  ABDOMEN: Soft, nontender, nondistended. Bowel sounds present. No organomegaly or mass.  EXTREMITIES: No cyanosis, clubbing or edema b/l.    NEUROLOGIC: Cranial nerves II through XII are intact. No focal Motor or sensory deficits b/l.   PSYCHIATRIC: The patient is alert and oriented x 3.  SKIN: No obvious rash, lesion, or ulcer.    LABORATORY PANEL:   CBC  Recent Labs Lab 12/05/16 0421  WBC 4.9  HGB 8.1*  HCT 24.1*  PLT 349   ------------------------------------------------------------------------------------------------------------------  Chemistries   Recent Labs Lab 12/05/16 0421  NA 141  K 3.4*  CL 109  CO2 28  GLUCOSE 180*  BUN 11  CREATININE 0.71  CALCIUM 8.6*  MG 1.5*   ------------------------------------------------------------------------------------------------------------------  Cardiac Enzymes  Recent Labs Lab 12/04/16 1750  TROPONINI <0.03   ------------------------------------------------------------------------------------------------------------------  RADIOLOGY:  Dg Chest Portable 1 View  Result Date: 12/04/2016 CLINICAL DATA:  Patient reports SOB and CP. Hx DM. Non-smoker. EXAM: PORTABLE CHEST 1 VIEW COMPARISON:  01/31/2016 FINDINGS: External pacer/defibrillator. Midline trachea. Borderline cardiomegaly. No pleural effusion or pneumothorax. Low lung volumes. Mild pulmonary interstitial prominence and indistinctness. No lobar consolidation. IMPRESSION: Borderline cardiomegaly with mild pulmonary venous congestion. Electronically Signed   By: Abigail Miyamoto M.D.   On: 12/04/2016 18:34     ASSESSMENT AND PLAN:   46 year old male with past medical history of diabetes, hypertension, history of CHF who presented to the hospital with chest pain.  1. Acute on  chronic systolic CHF-patient on cardiac catheterization have LV gram which showed EF of 35%. Patient had a previous echocardiogram last year which showed EF of 50-55%. This is nonischemic in nature. -Continue diuresis with IV Lasix, follow I's and O's and daily weights. -Continue carvedilol. Await repeat echocardiogram results.  -appreciate cardiology input.  2. Diabetes type 2 without complication-continue glipizide, sliding scale insulin.  3. Chest pain with left bundle branch block-initially this was thought to be secondary to an ST elevation MI. Patient underwent cardiac catheterization which showed no significant coronary disease. -Clinically asymptomatic presently.  4. Iron deficiency anemia-patient's hemoglobin is stable. Status post recent colonoscopy and EGD with no acute pathology.  All the records are reviewed and case discussed with Care Management/Social Worker. Management plans discussed with the patient, family and they are in agreement.  CODE STATUS: Full code  DVT Prophylaxis: Lovenox  TOTAL TIME TAKING CARE OF THIS PATIENT: 30 minutes.   POSSIBLE D/C IN 1-2 DAYS, DEPENDING ON CLINICAL CONDITION.   Henreitta Leber M.D on 12/05/2016 at 3:51 PM  Between 7am to 6pm - Pager - [phone]  After 6pm go to [email or web address] - Proofreader  [employer]  Hospitalists  Office  [phone]  CC: Primary care physician; Eliezer Lofts, MD

## 2016-12-05 NOTE — Telephone Encounter (Signed)
FYI Spoke to spouse she stated pt is @ [facility] hospital.  He was admitted last night

## 2016-12-05 NOTE — Progress Notes (Signed)
Spoke with Christell Faith, PA and made him aware of mag of 1.5 and PA gave order for magox BID.

## 2016-12-05 NOTE — Telephone Encounter (Signed)
Left message letting pt know forms are ready and they have been faxed Copy for pt Copy for file Copy for scan Copy for billing

## 2016-12-05 NOTE — Progress Notes (Signed)
Report called to Tammy, RN on 2A.  Patient has been A&Ox4.  Denies chest pain.  VSS with no shortness of breath.  Wife at bedside throughout shift.

## 2016-12-05 NOTE — Progress Notes (Signed)
Patient moved to room 231 by wheelchair with Hood, NT.  Patient alert with no distress noted when leaving ICU.  Tele monitor on.  Wife at patient's side.

## 2016-12-06 ENCOUNTER — Telehealth: Payer: Self-pay | Admitting: *Deleted

## 2016-12-06 LAB — BASIC METABOLIC PANEL WITH GFR
Anion gap: 6 (ref 5–15)
BUN: 17 mg/dL (ref 6–20)
CO2: 30 mmol/L (ref 22–32)
Calcium: 8.6 mg/dL — ABNORMAL LOW (ref 8.9–10.3)
Chloride: 101 mmol/L (ref 101–111)
Creatinine, Ser: 0.87 mg/dL (ref 0.61–1.24)
GFR calc Af Amer: >60 mL/min
GFR calc non Af Amer: >60 mL/min
Glucose, Bld: 206 mg/dL — ABNORMAL HIGH (ref 65–99)
Potassium: 4.3 mmol/L (ref 3.5–5.1)
Sodium: 137 mmol/L (ref 135–145)

## 2016-12-06 LAB — MAGNESIUM: Magnesium: 1.9 mg/dL (ref 1.7–2.4)

## 2016-12-06 LAB — GLUCOSE, CAPILLARY
Glucose-Capillary: 197 mg/dL — ABNORMAL HIGH (ref 65–99)
Glucose-Capillary: 336 mg/dL — ABNORMAL HIGH (ref 65–99)

## 2016-12-06 MED ORDER — LISINOPRIL 5 MG PO TABS
5.0000 mg | ORAL_TABLET | Freq: Every day | ORAL | Status: DC
Start: 2016-12-07 — End: 2016-12-06

## 2016-12-06 MED ORDER — CARVEDILOL 3.125 MG PO TABS: 3.1250 mg | ORAL_TABLET | Freq: Two times a day (BID) | ORAL | 0 refills | Status: DC

## 2016-12-06 MED ORDER — SPIRONOLACTONE 25 MG PO TABS
25.0000 mg | ORAL_TABLET | Freq: Every day | ORAL | Status: DC
Start: 2016-12-06 — End: 2016-12-06
  Administered 2016-12-06: 25 mg via ORAL
  Filled 2016-12-06: qty 1

## 2016-12-06 MED ORDER — MAGNESIUM OXIDE 400 (241.3 MG) MG PO TABS: 400.0000 mg | ORAL_TABLET | Freq: Every day | ORAL | 0 refills | Status: DC

## 2016-12-06 MED ORDER — LISINOPRIL 2.5 MG PO TABS: 2.5000 mg | ORAL_TABLET | Freq: Every day | ORAL | 0 refills | Status: DC

## 2016-12-06 NOTE — Discharge Instructions (Signed)
Heart Failure Clinic appointment on December 17, 2016 at 10:40am with Darylene Price, [place]. Please call [phone] to reschedule.

## 2016-12-06 NOTE — Progress Notes (Addendum)
Inpatient Diabetes Program Recommendations  AACE/ADA: New Consensus Statement on Inpatient Glycemic Control (2015)  Target Ranges:  Prepandial:   less than 140 mg/dL      Peak postprandial:   less than 180 mg/dL (1-2 hours)      Critically ill patients:  140 - 180 mg/dL   Lab Results  Component Value Date   GLUCAP 197 (H) 12/06/2016   HGBA1C 9.6 (H) 11/27/2016    Review of Glycemic Control  Results for MUSCAB, BRENNEMAN (MRN [id number]) as of 12/06/2016 08:34  Ref. Range 12/05/2016 08:12 12/05/2016 11:21 12/05/2016 16:38 12/05/2016 21:29 12/06/2016 07:42  Glucose-Capillary Latest Ref Range: 65 - 99 mg/dL 173 (H) 212 (H) 237 (H) 242 (H) 197 (H)    Diabetes history: Type 2 Outpatient Diabetes medications: Glipizide 10mg  qday, Glucophage XR 2000mg  qam Current orders for Inpatient glycemic control: Glipizide 10mg  qday, Novolog 0-9 units tid, Novolog 0-5 units qhs  Inpatient Diabetes Program Recommendations:  A1C 9.6% and CBG 173-242mg /dl over the past 24 hours.   Consider adding low dose basal insulin, consider Lantus 12 units qhs starting today (0.15units/kg).  Consider d/c Glipizide while inpatient.   Gentry Fitz, RN, BA, MHA, CDE Diabetes Coordinator Inpatient Diabetes Program  [phone] (Team Pager) [phone] ([place]) 12/06/2016 8:39 AM

## 2016-12-06 NOTE — Telephone Encounter (Signed)
-----   Message from Blain Pais sent at 12/06/2016 11:42 AM EDT ----- Regarding: tcm/ph 3/28  9;30 Dr. Saunders Revel

## 2016-12-06 NOTE — Care Management (Signed)
Admitted with acute on chronic heart failure.  Independent in all adls, denies issues accessing medical care, obtaining medications or with transportation.  Current with  PCP.  Referral to HF Clinic and provided brochure.  Has access to scales and education booklet for heart failure. No discharge needs identified at present by care manager or members of care team

## 2016-12-06 NOTE — Progress Notes (Signed)
Initial HF Clinic appointment made for December 17, 2016 at 10:40am. Thank you for the referral.

## 2016-12-06 NOTE — Progress Notes (Signed)
Patient is discharge in a stable condition, summary and f/u care given to both pt and wife , verbalized understanding ,

## 2016-12-06 NOTE — Telephone Encounter (Signed)
Patient currently admitted

## 2016-12-06 NOTE — Progress Notes (Signed)
Progress Note  Patient Name: Philip Ponce Date of Encounter: 12/06/2016  Primary Cardiologist: New to [facility]  Subjective   Reports that he feels relatively well as morning Felt little bit uneasy on his feet yesterday walking around nursing station Denies any significant shortness of breath on exertion No orthostasis  Inpatient Medications    Scheduled Meds: . carvedilol  3.125 mg Oral BID WC  . enoxaparin (LOVENOX) injection  40 mg Subcutaneous Q24H  . ferrous sulfate  325 mg Oral BID WC  . furosemide  40 mg Intravenous BID  . glipiZIDE  10 mg Oral Q breakfast  . insulin aspart  0-5 Units Subcutaneous QHS  . insulin aspart  0-9 Units Subcutaneous TID WC  . lisinopril  2.5 mg Oral Daily  . magnesium oxide  400 mg Oral BID  . pantoprazole  40 mg Oral Daily  . sodium chloride flush  3 mL Intravenous Q12H   Continuous Infusions:  PRN Meds: sodium chloride, acetaminophen, cyclobenzaprine, ondansetron (ZOFRAN) IV, sodium chloride flush   Vital Signs    Vitals:   12/05/16 1951 12/05/16 2130 12/06/16 0355 12/06/16 0817  BP: 108/69 103/68 (!) 98/58 114/61  Pulse: 97 87 80 90  Resp: 16 14 14 16   Temp: 99.2 F (37.3 C) 98.4 F (36.9 C) 97.8 F (36.6 C) 98.5 F (36.9 C)  TempSrc: Oral Oral  Oral  SpO2: 99% 98% 98% 100%  Weight:      Height:        Intake/Output Summary (Last 24 hours) at 12/06/16 1319 Last data filed at 12/06/16 1006  Gross per 24 hour  Intake              240 ml  Output              900 ml  Net             -660 ml   Filed Weights   12/04/16 1751 12/04/16 2000  Weight: 185 lb (83.9 kg) 175 lb 14.8 oz (79.8 kg)    Telemetry    Telemetry personally reviewed by myself showing normal sinus rhythm  ECG      Physical Exam   GEN: No acute distress.  Neck: No JVD  Cardiac: RRR, no murmurs, rubs, or gallops.  Radials/DP/PT 2+ and equal bilaterally.  Respiratory:  Clear to auscultation bilaterally. GI: Soft, nontender, non-distended    MS: no deformity; no edema Neuro:  Alert and oriented x 3  Labs    Chemistry Recent Labs Lab 12/04/16 1750 12/05/16 0421 12/06/16 0308  NA 136 141 137  K 4.1 3.4* 4.3  CL 104 109 101  CO2 26 28 30   GLUCOSE 228* 180* 206*  BUN 11 11 17   CREATININE 0.80 0.71 0.87  CALCIUM 8.8* 8.6* 8.6*  GFRNONAA >60 >60 >60  GFRAA >60 >60 >60  ANIONGAP 6 4* 6     Hematology Recent Labs Lab 12/03/16 0915 12/04/16 1750 12/05/16 0421  WBC 4.2 6.3 4.9  RBC 2.81* 3.08* 2.85*  HGB 7.8* 8.6* 8.1*  HCT 23.9* 26.3* 24.1*  MCV 85.3 85.4 84.6  MCH 27.7 28.0 28.4  MCHC 32.4 32.8 33.5  RDW 13.8 13.9 13.8  PLT 319 377 349    Cardiac Enzymes Recent Labs Lab 12/04/16 1750  TROPONINI <0.03   No results for input(s): TROPIPOC in the last 168 hours.   BNPNo results for input(s): BNP, PROBNP in the last 168 hours.   DDimer No results for input(s):  DDIMER in the last 168 hours.   Radiology    Dg Chest Portable 1 View  Result Date: 12/04/2016 CLINICAL DATA:  Patient reports SOB and CP. Hx DM. Non-smoker. EXAM: PORTABLE CHEST 1 VIEW COMPARISON:  01/31/2016 FINDINGS: External pacer/defibrillator. Midline trachea. Borderline cardiomegaly. No pleural effusion or pneumothorax. Low lung volumes. Mild pulmonary interstitial prominence and indistinctness. No lobar consolidation. IMPRESSION: Borderline cardiomegaly with mild pulmonary venous congestion. Electronically Signed   By: Abigail Miyamoto M.D.   On: 12/04/2016 18:34    Cardiac Studies   Echocardiogram personally reviewed by myself showing ejection fraction 20-25%, global hypokinesis Results discussed with patient and patient's wife in detail  Patient Profile     46 y.o. male  with history of cardiomyopathy, diabetes mellitus (reportedly type I but on oral agents), gastric bypass, and recent acute blood loss anemia who presented to [facility] as a code STEMI. Urgent cardiac cath showed no occlusive CAD with reduced EF of 30-35% by LV gram.    Assessment & Plan    -----Dilated nonischemic cardiomyopathy Ejection fraction 20-25% on echocardiogram, dilated LV Long discussion with him this morning, he reports prior alcohol history Uncertain if alcohol is the cause of his dilated nonischemic cardiomyopathy  Currently tolerating low-dose carvedilol, Lasix 40 mg twice a day Discussed various occasions with him in detail Recommended we increase lisinopril up to 5 mg daily, add Aldactone  Recommended he walk around and if no significant symptoms, he could potentially be discharged home if medicine service agrees -As an outpatient will try to change his ACE inhibitor to entresto if blood pressure tolerates He will need repeat echocardiogram in 2-3 months which can be arranged as an outpatient  -----Anemia Recent blood loss from gastric ulcers in February 2018 He has received iron infusion this hospitalization I suspect his symptoms of shortness of breath may improve as blood count trends back up to normal level  -----Alcohol history Recommended alcohol cessation given his dilated cardiomyopathy  ----Diabetes2 Sugars running around 200 Would restart metformin  ----Gastric bypass surgery He reports 100 pound weight loss  Long discussion with him concerning recent echocardiogram results, medications, dictation changes needed to be made as an outpatient, follow-up echocardiogram in 2-3 months time  Total encounter time more than 35 minutes  Greater than 50% was spent in counseling and coordination of care with the patient   Signed, Ida Rogue, MD  12/06/2016, 1:19 PM

## 2016-12-06 NOTE — Discharge Summary (Signed)
[facility] [facility] [place] NAME: Philip Ponce    MR#:  [id number]  DATE OF BIRTH:  April 26, 1971  DATE OF ADMISSION:  12/04/2016 ADMITTING PHYSICIAN: [name], MD  DATE OF DISCHARGE: 12/06/2016  PRIMARY CARE PHYSICIAN: Eliezer Lofts, MD   ADMISSION DIAGNOSIS:  ST elevation myocardial infarction (STEMI), unspecified artery ([place]) [I21.3] Acute on chronic systolic heart failure ([place]) [I50.23]  DISCHARGE DIAGNOSIS:  Principal Problem:   Acute on chronic systolic heart failure (HCC) Active Problems:   LBBB (left bundle branch block)   Non-ischemic cardiomyopathy (HCC)   Anemia   ST elevation myocardial infarction (STEMI) (HCC)   Iron deficiency anemia   CHF (congestive heart failure) (HCC)   Shortness of breath   Abnormal EKG   SECONDARY DIAGNOSIS:   Past Medical History:  Diagnosis Date  . Acute blood loss anemia 11/20/2016  . CHF (congestive heart failure) ([place])   . Diabetes mellitus type I ([place])   . Slipped intervertebral disc    L4 L5     ADMITTING HISTORY  HISTORY OF PRESENT ILLNESS:  Philip Ponce  is a 46 y.o. male with a known history of Diabetes, cardiomyopathy with ejection fraction 35%, iron deficiency anemia, hypertension presented to the emergency room with chest pain or shortness of breath. His EKG showed left bundle branch block and a code STEMI was activated. Patient was rushed to the cardiac catheterization. His coronaries had been found to be clean. This time he is being admitted to the ICU for acute on chronic systolic congestive heart failure and overnight monitoring. He was also at [facility] recently for some melena. He was scheduled for outpatient colonoscopy and EGD with [name] which have been done and are normal. He is waiting for an outpatient iron infusion at this time and at this month. He does have orthopnea and some dry cough. Feels extremely fatigued. No chest pain at this time. Afebrile.   HOSPITAL  COURSE:   * Acute on chronic systolic CHF Treated with IV Lasix, beta blockers and ace inhibitors. - BUN and creatinine remained stable. Potassium replaced. - Echo-last echocardiogram reviewed. Echocardiogram in the hospital shows ejection fraction of 25%. Discussed with Dr. Rockey Situ on day of discharge. He will be on Coreg and lisinopril. Likely Entresto as outpatient with cardiology.  * Chest pain with left bundle branch block on EKG Cardiac catheterization showed normal coronaries.  * Iron Deficiency anemia He received 1 dose of IV iron in the hospital. Hemoglobin stable Recent EGD and colonoscopy by [name] upper normal.  * Diabetes mellitus Sliding scale insulin. Home medications.  * DVT prophylaxis Lovenox in the hospital.  Stable for discharge home to follow-up with his primary care physician and cardiology. On beta blocker and ACE inhibitor at time of discharge.  CONSULTS OBTAINED:    DRUG ALLERGIES:   Allergies  Allergen Reactions  . Mobic [Meloxicam] Other (See Comments)    Ulcers in stomach eruption   . Diclofenac Other (See Comments)    Fast heart beat,, CP,SOB and weakness on one side.    DISCHARGE MEDICATIONS:   Current Discharge Medication List    START taking these medications   Details  carvedilol (COREG) 3.125 MG tablet Take 1 tablet (3.125 mg total) by mouth 2 (two) times daily with a meal. Qty: 60 tablet, Refills: 0    lisinopril (PRINIVIL,ZESTRIL) 2.5 MG tablet Take 1 tablet (2.5 mg total) by mouth daily. Qty: 30 tablet, Refills: 0    magnesium oxide (  MAG-OX) 400 (241.3 Mg) MG tablet Take 1 tablet (400 mg total) by mouth daily. Qty: 30 tablet, Refills: 0      CONTINUE these medications which have NOT CHANGED   Details  aspirin 81 MG tablet Take 81 mg by mouth daily.    calcium carbonate (OS-CAL) 600 MG TABS tablet Take 600 mg by mouth 2 (two) times daily with a meal.    cyclobenzaprine (FLEXERIL) 10 MG tablet Take 5-10 mg by mouth at  bedtime as needed for muscle spasms.    diclofenac (VOLTAREN) 75 MG EC tablet Take 75 mg by mouth 2 (two) times daily.    ferrous sulfate 325 (65 FE) MG tablet Take 1 tablet (325 mg total) by mouth 2 (two) times daily with a meal. Qty: 60 tablet, Refills: 3    glipiZIDE (GLUCOTROL XL) 10 MG 24 hr tablet Take 1 tablet (10 mg total) by mouth daily with breakfast. Qty: 90 tablet, Refills: 3    glucose blood (ONE TOUCH ULTRA TEST) test strip Check blood sugar once daily and as instructed. Dx E11.9 Qty: 100 each, Refills: 1    metFORMIN (GLUCOPHAGE-XR) 500 MG 24 hr tablet Take 2 tablets (1,000 mg total) by mouth daily with breakfast. Qty: 180 tablet, Refills: 3    Multiple Vitamins-Minerals (MENS MULTI VITAMIN & MINERAL PO) Take 2 tablets by mouth daily. Men's Vita Fusion    Omega-3 Fatty Acids (FISH OIL) 1200 MG CAPS Take 1 capsule by mouth daily.    omeprazole (PRILOSEC) 40 MG capsule Take 1 capsule (40 mg total) by mouth daily. Qty: 30 capsule, Refills: 0        Today   VITAL SIGNS:  Blood pressure 114/61, pulse 90, temperature 98.5 F (36.9 C), temperature source Oral, resp. rate 16, height 6\' 1"  (1.854 m), weight 79.8 kg (175 lb 14.8 oz), SpO2 100 %.  I/O:   Intake/Output Summary (Last 24 hours) at 12/06/16 1333 Last data filed at 12/06/16 1006  Gross per 24 hour  Intake              240 ml  Output              900 ml  Net             -660 ml    PHYSICAL EXAMINATION:  Physical Exam  GENERAL:  46 y.o.-year-old patient lying in the bed with no acute distress.  LUNGS: Normal breath sounds bilaterally, no wheezing, rales,rhonchi or crepitation. No use of accessory muscles of respiration.  CARDIOVASCULAR: S1, S2 normal. No murmurs, rubs, or gallops.  ABDOMEN: Soft, non-tender, non-distended. Bowel sounds present. No organomegaly or mass.  NEUROLOGIC: Moves all 4 extremities. PSYCHIATRIC: The patient is alert and oriented x 3.  SKIN: No obvious rash, lesion, or ulcer.    DATA REVIEW:   CBC  Recent Labs Lab 12/05/16 0421  WBC 4.9  HGB 8.1*  HCT 24.1*  PLT 349    Chemistries   Recent Labs Lab 12/06/16 0308  NA 137  K 4.3  CL 101  CO2 30  GLUCOSE 206*  BUN 17  CREATININE 0.87  CALCIUM 8.6*  MG 1.9    Cardiac Enzymes  Recent Labs Lab 12/04/16 1750  TROPONINI <0.03    Microbiology Results  Results for orders placed or performed during the hospital encounter of 12/04/16  MRSA PCR Screening     Status: None   Collection Time: 12/04/16  7:55 PM  Result Value Ref Range Status   MRSA  by PCR NEGATIVE NEGATIVE Final    Comment:        The GeneXpert MRSA Assay (FDA approved for NASAL specimens only), is one component of a comprehensive MRSA colonization surveillance program. It is not intended to diagnose MRSA infection nor to guide or monitor treatment for MRSA infections.     RADIOLOGY:  Dg Chest Portable 1 View  Result Date: 12/04/2016 CLINICAL DATA:  Patient reports SOB and CP. Hx DM. Non-smoker. EXAM: PORTABLE CHEST 1 VIEW COMPARISON:  01/31/2016 FINDINGS: External pacer/defibrillator. Midline trachea. Borderline cardiomegaly. No pleural effusion or pneumothorax. Low lung volumes. Mild pulmonary interstitial prominence and indistinctness. No lobar consolidation. IMPRESSION: Borderline cardiomegaly with mild pulmonary venous congestion. Electronically Signed   By: Abigail Miyamoto M.D.   On: 12/04/2016 18:34    Follow up with PCP in 1 week.  Management plans discussed with the patient, family and they are in agreement.  CODE STATUS:     Code Status Orders        Start     Ordered   12/04/16 1952  Full code  Continuous     12/04/16 1951    Code Status History    Date Active Date Inactive Code Status Order ID Comments User Context   This patient has a current code status but no historical code status.      TOTAL TIME TAKING CARE OF THIS PATIENT ON DAY OF DISCHARGE: more than 30 minutes.   Hillary Bow R M.D  on 12/06/2016 at 1:33 PM  Between 7am to 6pm - Pager - [phone]  After 6pm go to [email or web address] - password [email or web address] [facility]  [facility] [place] [facility]  Office  [phone]  CC: Primary care physician; Eliezer Lofts, MD  Note: This dictation was prepared with Dragon dictation along with smaller phrase technology. Any transcriptional errors that result from this process are unintentional.

## 2016-12-07 ENCOUNTER — Telehealth: Payer: Self-pay | Admitting: *Deleted

## 2016-12-07 NOTE — Telephone Encounter (Signed)
Called patient and left message to return call

## 2016-12-07 NOTE — Telephone Encounter (Signed)
Patient contacted regarding discharge from [facility] on 12/06/16.  Patient understands to follow up with provider Dr. Saunders Revel on 12/19/16 at 09:30AM at [facility]. Patient understands discharge instructions? Left message Patient understands medications and regiment? Left message Patient understands to bring all medications to this visit? Left message  Left voicemail message with appointment information and instructions to call back if he has any questions regarding his discharge instructions or medications.

## 2016-12-11 ENCOUNTER — Ambulatory Visit (INDEPENDENT_AMBULATORY_CARE_PROVIDER_SITE_OTHER): Payer: 59 | Admitting: Family Medicine

## 2016-12-11 ENCOUNTER — Encounter: Payer: Self-pay | Admitting: Family Medicine

## 2016-12-11 VITALS — BP 110/70 | HR 96 | Temp 98.6°F | Ht 71.5 in | Wt 184.8 lb

## 2016-12-11 DIAGNOSIS — I10 Essential (primary) hypertension: Secondary | ICD-10-CM | POA: Diagnosis not present

## 2016-12-11 DIAGNOSIS — E119 Type 2 diabetes mellitus without complications: Secondary | ICD-10-CM | POA: Diagnosis not present

## 2016-12-11 DIAGNOSIS — D62 Acute posthemorrhagic anemia: Secondary | ICD-10-CM

## 2016-12-11 DIAGNOSIS — K2901 Acute gastritis with bleeding: Secondary | ICD-10-CM

## 2016-12-11 DIAGNOSIS — I447 Left bundle-branch block, unspecified: Secondary | ICD-10-CM

## 2016-12-11 DIAGNOSIS — I5023 Acute on chronic systolic (congestive) heart failure: Secondary | ICD-10-CM

## 2016-12-11 DIAGNOSIS — I428 Other cardiomyopathies: Secondary | ICD-10-CM | POA: Diagnosis not present

## 2016-12-11 MED ORDER — METFORMIN HCL ER 500 MG PO TB24: 2000.0000 mg | ORAL_TABLET | Freq: Every day | ORAL | 11 refills | Status: DC

## 2016-12-11 NOTE — Patient Instructions (Addendum)
Stop glucotrol Xl.  Continue metformin XL but increase to  2 tabs twice daily.  Check fasting blood sugar and 2 hour after meals.  Call in 3 days with blood sugar measurements.

## 2016-12-11 NOTE — Progress Notes (Signed)
   Subjective:    Patient ID: Philip Ponce, male    DOB: Nov 09, 1970, 45 y.o.   MRN: [id number]  HPI  46 year old male presents for DM follow up.  Since last OV he had colonoscopy and endo by [name] on 3/12.  Hewas readmitted the following day with a new LBBB and  chest pain.  He was discharged on 3/15.  Discharge summary   Copied for info as below:  Acute on chronic systolic CHF Treated with IV Lasix, beta blockers and ace inhibitors. - BUN and creatinine remained stable. Potassium replaced. - Echo-last echocardiogram reviewed. Echocardiogram in the hospital shows ejection fraction of 25%. Discussed with Dr. Rockey Situ on day of discharge. He will be on Coreg and lisinopril. Likely Entresto as outpatient with cardiology.  * Chest pain with left bundle branch block on EKG Cardiac catheterization showed normal coronaries.  * Iron Deficiency anemia He received 1 dose of IV iron in the hospital. Hemoglobin stable Recent EGD and colonoscopy by [name] upper normal.  * Diabetes mellitus Sliding scale insulin. Home medications.  * DVT prophylaxis Lovenox in the hospital.    Today 12/11/16 he reports  He continues to have elevated blood sugars.  FBS 298-335,   2 hours after meals  He is on glucotrol xl 10 mg,  metformin 1000 mg daily.  Was controlled on 70/30 in past.  He has been feeling very tired. Cannot function  Day to day.  Supposed to return to work on 3/27.. Not able to do it.  He works to manage a team and a machine.   Lab Results  Component Value Date   HGBA1C 9.6 (H) 11/27/2016    With addition of BBlocker and ACEI he has been feeling off balance, occ dizzy. No further chest pain. Off and on SOB.  CHF,  No current edema.   Decreased EF 20-25% ECHO on 3/14. BP Readings from Last 3 Encounters:  12/11/16 110/70  12/06/16 114/61  12/03/16 120/88    Blood loss anemia from stomach irritation from NSAID. Upper endo nml. He has appt for IV infusions on  12/18/2016.     Review of Systems  Constitutional: Positive for fatigue. Negative for fever.  HENT: Negative for ear pain.   Eyes: Negative for pain.  Respiratory: Positive for shortness of breath. Negative for wheezing.   Cardiovascular: Negative for chest pain, palpitations and leg swelling.  Gastrointestinal: Negative for abdominal distention.       Objective:   Physical Exam  Constitutional: Vital signs are normal. He appears well-developed and well-nourished.  HENT:  Head: Normocephalic.  Right Ear: Hearing normal.  Left Ear: Hearing normal.  Nose: Nose normal.  Mouth/Throat: Oropharynx is clear and moist and mucous membranes are normal.  Neck: Trachea normal. Carotid bruit is not present. No thyroid mass and no thyromegaly present.  Cardiovascular: Normal rate, regular rhythm and normal pulses.  Exam reveals no gallop, no distant heart sounds and no friction rub.   No murmur heard. No peripheral edema  Pulmonary/Chest: Effort normal and breath sounds normal. No respiratory distress.  Skin: Skin is warm, dry and intact. No rash noted.  Psychiatric: He has a normal mood and affect. His speech is normal and behavior is normal. Thought content normal.          Assessment & Plan:

## 2016-12-11 NOTE — Assessment & Plan Note (Signed)
Running low but in nml range on new meds. Pt encouraged to continue given CHF benefit.

## 2016-12-11 NOTE — Assessment & Plan Note (Signed)
ON BBlocker and ACEI.

## 2016-12-11 NOTE — Assessment & Plan Note (Signed)
Has upcoming OV for likely iron transfusion. Off of iron orally given constipation.  No further blood in stool.

## 2016-12-11 NOTE — Assessment & Plan Note (Signed)
No further blood loss. ON PPI. Off NSAID. Followed by [name].

## 2016-12-11 NOTE — Assessment & Plan Note (Signed)
Very poor control. Likely needs to restart insulin. Will increase to max metformin and if CBGs not at goal when pt calls  At end of week.. Will start lantus . Pt agreeable.  Stop glucotrol as likely not helping.

## 2016-12-11 NOTE — Progress Notes (Signed)
Pre visit review using our clinic review tool, if applicable. No additional management support is needed unless otherwise documented below in the visit note. 

## 2016-12-11 NOTE — Assessment & Plan Note (Signed)
Currently euvolemic. Now on ACEI and BBlocker. No indication for diuretic at this time.  Pt has upcoming OV with CHF clinic next week.

## 2016-12-11 NOTE — Assessment & Plan Note (Signed)
New. Cath nml. Has follow up with cardiology next week.

## 2016-12-12 ENCOUNTER — Telehealth: Payer: Self-pay | Admitting: Family Medicine

## 2016-12-12 NOTE — Telephone Encounter (Signed)
Spoke to dan @ [employer] disability benefits and leave Letting him know pt cannot go back to work 4/4 per dr [name] He stated they would be sending paperwork for me to fill out and return Pt aware

## 2016-12-14 ENCOUNTER — Ambulatory Visit: Payer: 59 | Admitting: Family Medicine

## 2016-12-14 NOTE — Telephone Encounter (Signed)
Done

## 2016-12-14 NOTE — Telephone Encounter (Signed)
Paperwork in dr [name] IN BOX For review and signature

## 2016-12-17 ENCOUNTER — Encounter: Payer: Self-pay | Admitting: Family

## 2016-12-17 ENCOUNTER — Ambulatory Visit: Payer: 59 | Attending: Family | Admitting: Family

## 2016-12-17 VITALS — BP 132/84 | HR 87 | Resp 18 | Ht 73.0 in | Wt 182.2 lb

## 2016-12-17 DIAGNOSIS — Z8042 Family history of malignant neoplasm of prostate: Secondary | ICD-10-CM | POA: Insufficient documentation

## 2016-12-17 DIAGNOSIS — Z800 Family history of malignant neoplasm of digestive organs: Secondary | ICD-10-CM | POA: Diagnosis not present

## 2016-12-17 DIAGNOSIS — Z833 Family history of diabetes mellitus: Secondary | ICD-10-CM | POA: Insufficient documentation

## 2016-12-17 DIAGNOSIS — E119 Type 2 diabetes mellitus without complications: Secondary | ICD-10-CM | POA: Diagnosis not present

## 2016-12-17 DIAGNOSIS — I509 Heart failure, unspecified: Secondary | ICD-10-CM | POA: Insufficient documentation

## 2016-12-17 DIAGNOSIS — Z9889 Other specified postprocedural states: Secondary | ICD-10-CM | POA: Diagnosis not present

## 2016-12-17 DIAGNOSIS — Z9884 Bariatric surgery status: Secondary | ICD-10-CM | POA: Insufficient documentation

## 2016-12-17 DIAGNOSIS — I110 Hypertensive heart disease with heart failure: Secondary | ICD-10-CM | POA: Diagnosis not present

## 2016-12-17 DIAGNOSIS — Z7982 Long term (current) use of aspirin: Secondary | ICD-10-CM | POA: Diagnosis not present

## 2016-12-17 DIAGNOSIS — Z8249 Family history of ischemic heart disease and other diseases of the circulatory system: Secondary | ICD-10-CM | POA: Diagnosis not present

## 2016-12-17 DIAGNOSIS — I5022 Chronic systolic (congestive) heart failure: Secondary | ICD-10-CM

## 2016-12-17 DIAGNOSIS — I252 Old myocardial infarction: Secondary | ICD-10-CM | POA: Diagnosis not present

## 2016-12-17 DIAGNOSIS — Z803 Family history of malignant neoplasm of breast: Secondary | ICD-10-CM | POA: Diagnosis not present

## 2016-12-17 DIAGNOSIS — Z79899 Other long term (current) drug therapy: Secondary | ICD-10-CM | POA: Insufficient documentation

## 2016-12-17 DIAGNOSIS — Z823 Family history of stroke: Secondary | ICD-10-CM | POA: Diagnosis not present

## 2016-12-17 DIAGNOSIS — Z888 Allergy status to other drugs, medicaments and biological substances status: Secondary | ICD-10-CM | POA: Diagnosis not present

## 2016-12-17 DIAGNOSIS — I10 Essential (primary) hypertension: Secondary | ICD-10-CM

## 2016-12-17 MED ORDER — SACUBITRIL-VALSARTAN 24-26 MG PO TABS: 1.0000 | ORAL_TABLET | Freq: Two times a day (BID) | ORAL | 5 refills | Status: DC

## 2016-12-17 NOTE — Patient Instructions (Addendum)
Continue weighing daily and call for an overnight weight gain of > 2 pounds or a weekly weight gain of >5 pounds.  Do not take any anymore lisinopril. Begin entresto one tablet tomorrow (Tuesday) evening and then begin taking it twice daily on Wednesday and then continue taking it twice daily.

## 2016-12-17 NOTE — Progress Notes (Signed)
Patient ID: Philip Ponce, male    DOB: Jan 22, 1971, 46 y.o.   MRN: [id number]  HPI [name] is a 46 yoM with PMH significant for DM, iron deficiency, HTN, gastric bypass, STEMI and CHF with reduced ejection fraction.  Last echo was done 12/05/16 and showed an EF of 20-25% with mild MR. Echo on 04/14/15 showed EF of 50-55%.  Last admission was 12/04/16 for chest pain. Workup for STEMI and cath showed no significant CAD. Pt also was experiencing symptoms of CHF and was treated with IV furosemide.   Presents today for initial visit with no shortness of breath or fatigue. Denies edema in legs, abdomen. Did experience SOB when laying down last night which resolved after using the restroom. This is not a common occurance. Not currently on fluid medication.   Past Medical History:  Diagnosis Date  . Acute blood loss anemia 11/20/2016  . CHF (congestive heart failure) ([place])   . Diabetes mellitus type I ([place])   . Slipped intervertebral disc    L4 L5   Past Surgical History:  Procedure Laterality Date  . COLONOSCOPY WITH PROPOFOL N/A 12/03/2016   Procedure: COLONOSCOPY WITH PROPOFOL;  Surgeon: [name], MD;  Location: [facility] ENDOSCOPY;  Service: Endoscopy;  Laterality: N/A;  . ESOPHAGOGASTRODUODENOSCOPY (EGD) WITH PROPOFOL N/A 12/03/2016   Procedure: ESOPHAGOGASTRODUODENOSCOPY (EGD) WITH PROPOFOL;  Surgeon: [name], MD;  Location: [facility] ENDOSCOPY;  Service: Endoscopy;  Laterality: N/A;  . GASTRIC BYPASS    . LEFT HEART CATH AND CORONARY ANGIOGRAPHY N/A 12/04/2016   Procedure: Left Heart Cath and Coronary Angiography;  Surgeon: [name], MD;  Location: [place] CV LAB;  Service: Cardiovascular;  Laterality: N/A;   Family History  Problem Relation Age of Onset  . Diabetes Mother   . Hypertension Mother   . Cancer Mother     breast  . Diabetes Father   . Hypertension Father   . Cancer Father     colon  . Cancer Maternal Grandfather     prostate  . Stroke Maternal  Grandfather     CVA   Social History  Substance Use Topics  . Smoking status: Never Smoker  . Smokeless tobacco: Never Used  . Alcohol use No   Allergies  Allergen Reactions  . Mobic [Meloxicam] Other (See Comments)    Ulcers in stomach eruption   . Diclofenac Other (See Comments)    Fast heart beat,, CP,SOB and weakness on one side.   Prior to Admission medications   Medication Sig Start Date End Date Taking? Authorizing Provider  aspirin 81 MG tablet Take 81 mg by mouth daily.   Yes Historical Provider, MD  calcium carbonate (OS-CAL) 600 MG TABS tablet Take 600 mg by mouth 2 (two) times daily with a meal.   Yes Historical Provider, MD  carvedilol (COREG) 3.125 MG tablet Take 1 tablet (3.125 mg total) by mouth 2 (two) times daily with a meal. 12/06/16  Yes Srikar Sudini, MD  ferrous sulfate 325 (65 FE) MG tablet Take 1 tablet (325 mg total) by mouth 2 (two) times daily with a meal. 11/27/16  Yes Amy E Bedsole, MD  glucose blood (ONE TOUCH ULTRA TEST) test strip Check blood sugar once daily and as instructed. Dx E11.9 01/19/16  Yes Amy Cletis [place], MD  magnesium oxide (MAG-OX) 400 (241.3 Mg) MG tablet Take 1 tablet (400 mg total) by mouth daily. 12/06/16  Yes Srikar Sudini, MD  metFORMIN (GLUCOPHAGE-XR) 500 MG 24 hr tablet Take 4 tablets (  2,000 mg total) by mouth daily with breakfast. Patient taking differently: Take 1,000 mg by mouth 2 (two) times daily.  12/11/16  Yes Amy Cletis [place], MD  Multiple Vitamins-Minerals (MENS MULTI VITAMIN & MINERAL PO) Take 2 tablets by mouth daily. Men's Vita Fusion   Yes Historical Provider, MD  Omega-3 Fatty Acids (FISH OIL) 1200 MG CAPS Take 1 capsule by mouth daily.   Yes Historical Provider, MD  omeprazole (PRILOSEC) 40 MG capsule Take 1 capsule (40 mg total) by mouth daily.11/23/16  12/23/16 Yes Tonia Ghent, MD  cyclobenzaprine (FLEXERIL) 5 MG tablet Take 5-10 mg by mouth at bedtime as needed for muscle spasms.    Historical Provider, MD  glipiZIDE  (GLUCOTROL XL) 10 MG 24 hr tablet Take 1 tablet (10 mg total) by mouth daily with breakfast. Patient not taking: Reported on 12/17/2016 01/13/16   Amy E [name], MD  sacubitril-valsartan (ENTRESTO) 24-26 MG Take 1 tablet by mouth 2 (two) times daily. 12/17/16   Alisa Graff, FNP    Review of Systems  Constitutional: Negative for appetite change and fatigue.  HENT: Negative for congestion and postnasal drip.   Eyes: Negative.   Respiratory: Negative for cough, chest tightness and shortness of breath.   Cardiovascular: Negative for chest pain, palpitations and leg swelling.  Gastrointestinal: Negative for abdominal distention, nausea and vomiting.  Endocrine: Negative.   Genitourinary: Negative.   Musculoskeletal: Negative for back pain and neck pain.  Skin: Negative.   Allergic/Immunologic: Negative.   Neurological: Positive for light-headedness. Negative for weakness and headaches.       Orthostatic hypotension  Hematological: Negative for adenopathy. Does not bruise/bleed easily.  Psychiatric/Behavioral: Positive for sleep disturbance. Negative for dysphoric mood. The patient is not nervous/anxious.        Sleeps with 2 pillows. Has shoulder pain that sometimes keeps him up at night    Physical Exam  Constitutional: He is oriented to person, place, and time. He appears well-developed and well-nourished.  HENT:  Head: Normocephalic and atraumatic.  Eyes: Conjunctivae are normal. Pupils are equal, round, and reactive to light.  Neck: Normal range of motion. Neck supple.  Cardiovascular: Normal rate and regular rhythm.   Pulmonary/Chest: Effort normal. He has no wheezes. He has no rales.  Abdominal: Soft. He exhibits no distension. There is no tenderness.  Musculoskeletal: He exhibits no edema or tenderness.  Neurological: He is alert and oriented to person, place, and time.  Skin: Skin is warm and dry.  Psychiatric: He has a normal mood and affect. His behavior is normal. Thought  content normal.  Nursing note and vitals reviewed.  Vitals:   12/17/16 1000  BP: 132/84  Pulse: 87  Resp: 18  SpO2: 100%  Weight: 182 lb 4 oz (82.7 kg)  Height: 6\' 1"  (1.854 m)   Wt Readings from Last 3 Encounters:  12/17/16 182 lb 4 oz (82.7 kg)  12/11/16 184 lb 12 oz (83.8 kg)  12/04/16 175 lb 14.8 oz (79.8 kg)   Assessment & Plan:  1. Congestive heart failure with reduced ejection fraction - NYHA class II - euvolumic - When has busy days, gets fatigued - Weighing daily and logging. Call for weight gain of > 2 lbs overnight or > 5 lbs in one week - not adding salt. Encouraged following < 2000 mg/day sodium intake. Reading food labels for sodium and carbs - Drinks 40 oz of diet soda every 2ish days. Encouraged limiting to 40-50 oz of fluid/day - Usually exercises 3x  weekly, discussed cardiac rehab and pt will speak with cardiology about it - Will d/c lisinopril and begin Entresto 24/26 mg bid - Will hold off on fluid medication as patient does not appear to be fluid overloaded. This was discussed with patient - Sees cardiologist (End) 3/28  2. Diabetes - A1c on 11/27/16 was 9.6% - Checking about twice a day, fasting (197 - 276), numbers have been better past couple of weeks due to decreased carb intake - No lows, discussed s/sx of lows - Recently glipizide was d/c and metformin increased to 2000 mg daily - Sees PCP on 4/4 [facility]), she has discussed possibility of starting insulin  3. HTN - BP looked good today 132/84 - Has blood pressure machine at home but typically doesn't check - Encouraged to check at home once initiated on Entresto  Will see patient back in 1 month or sooner if needed. Encouraged patient to bring medication bottles to each visit.  Darrow Bussing, PharmD Pharmacy Resident 12/17/2016 12:37 PM

## 2016-12-18 ENCOUNTER — Inpatient Hospital Stay: Payer: 59

## 2016-12-18 ENCOUNTER — Inpatient Hospital Stay: Payer: 59 | Attending: Oncology | Admitting: Oncology

## 2016-12-18 ENCOUNTER — Encounter: Payer: Self-pay | Admitting: Family

## 2016-12-18 ENCOUNTER — Encounter: Payer: Self-pay | Admitting: Oncology

## 2016-12-18 VITALS — BP 122/71 | HR 75 | Temp 96.3°F | Resp 18

## 2016-12-18 VITALS — BP 132/83 | HR 92 | Temp 95.7°F | Resp 18 | Wt 183.2 lb

## 2016-12-18 DIAGNOSIS — Z79899 Other long term (current) drug therapy: Secondary | ICD-10-CM | POA: Diagnosis not present

## 2016-12-18 DIAGNOSIS — I252 Old myocardial infarction: Secondary | ICD-10-CM

## 2016-12-18 DIAGNOSIS — Z803 Family history of malignant neoplasm of breast: Secondary | ICD-10-CM

## 2016-12-18 DIAGNOSIS — I110 Hypertensive heart disease with heart failure: Secondary | ICD-10-CM | POA: Diagnosis not present

## 2016-12-18 DIAGNOSIS — Z794 Long term (current) use of insulin: Secondary | ICD-10-CM

## 2016-12-18 DIAGNOSIS — D500 Iron deficiency anemia secondary to blood loss (chronic): Secondary | ICD-10-CM

## 2016-12-18 DIAGNOSIS — I452 Bifascicular block: Secondary | ICD-10-CM | POA: Diagnosis not present

## 2016-12-18 DIAGNOSIS — Z9884 Bariatric surgery status: Secondary | ICD-10-CM | POA: Diagnosis not present

## 2016-12-18 DIAGNOSIS — E7800 Pure hypercholesterolemia, unspecified: Secondary | ICD-10-CM

## 2016-12-18 DIAGNOSIS — I5042 Chronic combined systolic (congestive) and diastolic (congestive) heart failure: Secondary | ICD-10-CM | POA: Insufficient documentation

## 2016-12-18 DIAGNOSIS — I5022 Chronic systolic (congestive) heart failure: Secondary | ICD-10-CM

## 2016-12-18 DIAGNOSIS — Z800 Family history of malignant neoplasm of digestive organs: Secondary | ICD-10-CM | POA: Diagnosis not present

## 2016-12-18 DIAGNOSIS — K219 Gastro-esophageal reflux disease without esophagitis: Secondary | ICD-10-CM | POA: Diagnosis not present

## 2016-12-18 DIAGNOSIS — I429 Cardiomyopathy, unspecified: Secondary | ICD-10-CM

## 2016-12-18 DIAGNOSIS — E119 Type 2 diabetes mellitus without complications: Secondary | ICD-10-CM

## 2016-12-18 DIAGNOSIS — D509 Iron deficiency anemia, unspecified: Secondary | ICD-10-CM

## 2016-12-18 DIAGNOSIS — Z7982 Long term (current) use of aspirin: Secondary | ICD-10-CM | POA: Diagnosis not present

## 2016-12-18 LAB — CBC
HEMATOCRIT: 29.4 % — AB (ref 40.0–52.0)
Hemoglobin: 9.5 g/dL — ABNORMAL LOW (ref 13.0–18.0)
MCH: 28 pg (ref 26.0–34.0)
MCHC: 32.3 g/dL (ref 32.0–36.0)
MCV: 86.8 fL (ref 80.0–100.0)
PLATELETS: 249 10*3/uL (ref 150–440)
RBC: 3.38 MIL/uL — ABNORMAL LOW (ref 4.40–5.90)
RDW: 17.1 % — AB (ref 11.5–14.5)
WBC: 5 10*3/uL (ref 3.8–10.6)

## 2016-12-18 LAB — FERRITIN: Ferritin: 69 ng/mL (ref 24–336)

## 2016-12-18 LAB — IRON AND TIBC
Iron: 147 ug/dL (ref 45–182)
Saturation Ratios: 48 % — ABNORMAL HIGH (ref 17.9–39.5)
TIBC: 305 ug/dL (ref 250–450)
UIBC: 158 ug/dL

## 2016-12-18 MED ORDER — SODIUM CHLORIDE 0.9 % IV SOLN
Freq: Once | INTRAVENOUS | Status: AC
  Administered 2016-12-18: 14:00:00 via INTRAVENOUS
  Filled 2016-12-18: qty 1000

## 2016-12-18 MED ORDER — FERUMOXYTOL INJECTION 510 MG/17 ML
510.0000 mg | Freq: Once | INTRAVENOUS | Status: AC
  Administered 2016-12-18: 510 mg via INTRAVENOUS
  Filled 2016-12-18: qty 17

## 2016-12-18 NOTE — Progress Notes (Signed)
Hematology/Oncology Consult note [facility] [phone] [phone]  Patient Care Team: [name], MD as PCP - [place], FNP as Nurse Practitioner (Family Medicine) [name], MD as Consulting Physician (Cardiology)   Name of the patient: Philip Ponce  [id number]  1970-12-30    Reason for referral- iron deficiency anemia   Referring physician- dr. [name]  Date of visit: 12/18/16   History of presenting illness- patient is a 46 year old male who was seen by [name] for symptoms of bright red blood per rectum. Patient also had some epigastric pain in the past and was on NSAIDs at that time which was stopped. Patient's father died of colon cancer. Patient has a history of gastric bypass about 3 years ago. Last CBC from 12/05/2016 showed white count of 4.9, H&H of 8.1/24.1 and a platelet count of 349. Ferritin was low at 8. Serum iron was low at 15 and iron saturation was low at 5%. TIBC was normal at 295. H pylori stool antigen was negative. B12 folate and TSH were within normal limits. EGD on 12/03/2016 showed: Esophagus was normal. Evidence of gastric bypass. Otherwise normal findings of gastric bypass. Biopsies were taken for evaluation of celiac disease. Colonoscopy was normal. Patient also has a history of cardiomyopathy with an EF of 35% and during his admission for iron deficiency anemia and he was also found to have left bundle branch block and underwent cardiac catheterization which did not show any abnormalities. Patient has also had episodes of melena in February 2018 for which she was admitted to [facility] and was given PPI but did not undergo any EGD or colonoscopy at that time  ECOG PS- 0  Pain scale- 0   Review of systems- Review of Systems  Constitutional: Negative for chills, fever, malaise/fatigue and weight loss.  HENT: Negative for congestion, ear discharge and nosebleeds.   Eyes: Negative for  blurred vision.  Respiratory: Negative for cough, hemoptysis, sputum production, shortness of breath and wheezing.   Cardiovascular: Negative for chest pain, palpitations, orthopnea and claudication.  Gastrointestinal: Negative for abdominal pain, blood in stool, constipation, diarrhea, heartburn, melena, nausea and vomiting.  Genitourinary: Negative for dysuria, flank pain, frequency, hematuria and urgency.  Musculoskeletal: Negative for back pain, joint pain and myalgias.  Skin: Negative for rash.  Neurological: Negative for dizziness, tingling, focal weakness, seizures, weakness and headaches.  Endo/Heme/Allergies: Does not bruise/bleed easily.  Psychiatric/Behavioral: Negative for depression and suicidal ideas. The patient does not have insomnia.     Allergies  Allergen Reactions  . Mobic [Meloxicam] Other (See Comments)    Ulcers in stomach eruption   . Diclofenac Other (See Comments)    Fast heart beat,, CP,SOB and weakness on one side.    Patient Active Problem List   Diagnosis Date Noted  . Chronic systolic heart failure ([place]) 12/18/2016  . Abnormal EKG   . Non-ischemic cardiomyopathy ([place]) 12/04/2016  . ST elevation myocardial infarction (STEMI) ([place])   . Iron deficiency anemia   . History of bariatric surgery 11/20/2016  . Toenail fungus 10/14/2015  . RBBB 04/07/2015  . LBBB (left bundle branch block) 09/24/2013  . Obstructive apnea 09/24/2013  . GERD 09/29/2010  . 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 Essential hypertension, benign 07/09/2007  . HYPERCHOLESTEROLEMIA, PURE 06/12/2007  . Diabetes mellitus with no complication ([place]) 74/04/1447     Past Medical History:  Diagnosis Date  . Acute blood loss anemia 11/20/2016  . CHF (congestive heart failure) (  [place])   . Coronary artery disease   . Diabetes mellitus type I ([place])   . Slipped intervertebral disc    L4 L5  . STEMI (ST elevation myocardial infarction) [facility])      Past Surgical History:    Procedure Laterality Date  . COLONOSCOPY WITH PROPOFOL N/A 12/03/2016   Procedure: COLONOSCOPY WITH PROPOFOL;  Surgeon: [name], MD;  Location: [facility] ENDOSCOPY;  Service: Endoscopy;  Laterality: N/A;  . ESOPHAGOGASTRODUODENOSCOPY (EGD) WITH PROPOFOL N/A 12/03/2016   Procedure: ESOPHAGOGASTRODUODENOSCOPY (EGD) WITH PROPOFOL;  Surgeon: [name], MD;  Location: [facility] ENDOSCOPY;  Service: Endoscopy;  Laterality: N/A;  . GASTRIC BYPASS    . LEFT HEART CATH AND CORONARY ANGIOGRAPHY N/A 12/04/2016   Procedure: Left Heart Cath and Coronary Angiography;  Surgeon: [name], MD;  Location: [place] CV LAB;  Service: Cardiovascular;  Laterality: N/A;    Social History   Social History  . Marital status: Married    Spouse name: N/A  . Number of children: N/A  . Years of education: N/A   Occupational History  . 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 Smoking status: Never Smoker  . Smokeless tobacco: Never Used  . Alcohol use No  . Drug use: No  . Sexual activity: Not on file   Other Topics Concern  . Not on file   Social History Narrative   Regular exercise-yes, walking one mile per day   Diet: fast food, diet soda, unsweeted tea     Family History  Problem Relation Age of Onset  . Diabetes Mother   . Hypertension Mother   . Cancer Mother     breast  . Diabetes Father   . Hypertension Father   . Cancer Father     colon  . Cancer Maternal Grandfather     prostate  . Stroke Maternal Grandfather     CVA     Current Outpatient Prescriptions:  .  aspirin 81 MG tablet, Take 81 mg by mouth daily., Disp: , Rfl:  .  calcium carbonate (OS-CAL) 600 MG TABS tablet, Take 600 mg by mouth 2 (two) times daily with a meal., Disp: , Rfl:  .  carvedilol (COREG) 3.125 MG tablet, Take 1 tablet (3.125 mg total) by mouth 2 (two) times daily with a meal., Disp: 60 tablet, Rfl: 0 .  cyclobenzaprine (FLEXERIL) 5 MG tablet, Take 5-10 mg by  mouth at bedtime as needed for muscle spasms., Disp: , Rfl:  .  ferrous sulfate 325 (65 FE) MG tablet, Take 1 tablet (325 mg total) by mouth 2 (two) times daily with a meal., Disp: 60 tablet, Rfl: 3 .  glipiZIDE (GLUCOTROL XL) 10 MG 24 hr tablet, Take 1 tablet (10 mg total) by mouth daily with breakfast. (Patient not taking: Reported on 12/17/2016), Disp: 90 tablet, Rfl: 3 .  glucose blood (ONE TOUCH ULTRA TEST) test strip, Check blood sugar once daily and as instructed. Dx E11.9, Disp: 100 each, Rfl: 1 .  magnesium oxide (MAG-OX) 400 (241.3 Mg) MG tablet, Take 1 tablet (400 mg total) by mouth daily., Disp: 30 tablet, Rfl: 0 .  metFORMIN (GLUCOPHAGE-XR) 500 MG 24 hr tablet, Take 4 tablets (2,000 mg total) by mouth daily with breakfast. (Patient taking differently: Take 1,000 mg by mouth 2 (two) times daily. ), Disp: 120 tablet, Rfl: 11 .  Multiple Vitamins-Minerals (MENS MULTI VITAMIN & MINERAL PO), Take 2 tablets by mouth daily. Men's Vita Fusion, Disp: ,  Rfl:  .  Omega-3 Fatty Acids (FISH OIL) 1200 MG CAPS, Take 1 capsule by mouth daily., Disp: , Rfl:  .  omeprazole (PRILOSEC) 40 MG capsule, Take 1 capsule (40 mg total) by mouth daily., Disp: 30 capsule, Rfl: 0 .  sacubitril-valsartan (ENTRESTO) 24-26 MG, Take 1 tablet by mouth 2 (two) times daily., Disp: 60 tablet, Rfl: 5   Physical exam:  Vitals:   12/18/16 0851  BP: 132/83  Pulse: 92  Resp: 18  Temp: (!) 95.7 F (35.4 C)  TempSrc: Tympanic  SpO2: 99%  Weight: 183 lb 4 oz (83.1 kg)   Physical Exam  Constitutional: He is oriented to person, place, and time and well-developed, well-nourished, and in no distress.  HENT:  Head: Normocephalic and atraumatic.  Eyes: EOM are normal. Pupils are equal, round, and reactive to light.  Neck: Normal range of motion.  Cardiovascular: Normal rate, regular rhythm and normal heart sounds.   Pulmonary/Chest: Effort normal and breath sounds normal.  Abdominal: Soft. Bowel sounds are normal.    Neurological: He is alert and oriented to person, place, and time.  Skin: Skin is warm and dry.       CMP Latest Ref Rng & Units 12/06/2016  Glucose 65 - 99 mg/dL 206(H)  BUN 6 - 20 mg/dL 17  Creatinine 0.61 - 1.24 mg/dL 0.87  Sodium 135 - 145 mmol/L 137  Potassium 3.5 - 5.1 mmol/L 4.3  Chloride 101 - 111 mmol/L 101  CO2 22 - 32 mmol/L 30  Calcium 8.9 - 10.3 mg/dL 8.6(L)  Total Protein 6.0 - 8.3 g/dL -  Total Bilirubin 0.2 - 1.2 mg/dL -  Alkaline Phos 39 - 117 U/L -  AST 0 - 37 U/L -  ALT 0 - 53 U/L -   CBC Latest Ref Rng & Units 12/05/2016  WBC 3.8 - 10.6 K/uL 4.9  Hemoglobin 13.0 - 18.0 g/dL 8.1(L)  Hematocrit 40.0 - 52.0 % 24.1(L)  Platelets 150 - 440 K/uL 349    No images are attached to the encounter.  Dg Chest Portable 1 View  Result Date: 12/04/2016 CLINICAL DATA:  Patient reports SOB and CP. Hx DM. Non-smoker. EXAM: PORTABLE CHEST 1 VIEW COMPARISON:  01/31/2016 FINDINGS: External pacer/defibrillator. Midline trachea. Borderline cardiomegaly. No pleural effusion or pneumothorax. Low lung volumes. Mild pulmonary interstitial prominence and indistinctness. No lobar consolidation. IMPRESSION: Borderline cardiomegaly with mild pulmonary venous congestion. Electronically Signed   By: Abigail Miyamoto M.D.   On: 12/04/2016 18:34    Assessment and plan- Patient is a 46 y.o. male who has been referred to [place] for iron deficiency anemia likely secondary to malabsorption the setting of gastric bypass and blood loss  EGD and colonoscopy as mentioned about it did not reveal any source of bleeding. He needs to follow-up with GI as an outpatient for possible capsule endoscopy. Today we will repeat CBC along with ferritin and set him up for IV iron given that he was severely anemic. I did discuss the risks and benefits of ferriheme including all but not limited to possibility of infusion reaction, fatigue and leg swelling. Patient understands and agrees to proceed. We will plan to give him 2  doses of ferriheme 510 mg 2   Multiple family members have cancer including breast cancer in his mother and maternal aunt as well as maternal uncle. We will see if he would qualify for genetic testing at this time as he is not sure if any of his family members have been tested  Thank you for this kind referral and the opportunity to participate in the care of this patient   Visit Diagnosis 1. Iron deficiency anemia, unspecified iron deficiency anemia type     Dr. Randa Evens, MD, MPH [facility] Pager- [phone] 12/18/2016 11:21 AM

## 2016-12-18 NOTE — Progress Notes (Signed)
Patient here today as a new patient for anemia  

## 2016-12-19 ENCOUNTER — Encounter: Payer: Self-pay | Admitting: Internal Medicine

## 2016-12-19 ENCOUNTER — Ambulatory Visit (INDEPENDENT_AMBULATORY_CARE_PROVIDER_SITE_OTHER): Payer: 59 | Admitting: Internal Medicine

## 2016-12-19 VITALS — BP 120/68 | HR 85 | Ht 73.0 in | Wt 182.0 lb

## 2016-12-19 DIAGNOSIS — I5022 Chronic systolic (congestive) heart failure: Secondary | ICD-10-CM | POA: Diagnosis not present

## 2016-12-19 DIAGNOSIS — D509 Iron deficiency anemia, unspecified: Secondary | ICD-10-CM

## 2016-12-19 DIAGNOSIS — I10 Essential (primary) hypertension: Secondary | ICD-10-CM | POA: Diagnosis not present

## 2016-12-19 DIAGNOSIS — I428 Other cardiomyopathies: Secondary | ICD-10-CM | POA: Diagnosis not present

## 2016-12-19 MED ORDER — CARVEDILOL 3.125 MG PO TABS
3.1250 mg | ORAL_TABLET | Freq: Two times a day (BID) | ORAL | 3 refills | Status: DC
Start: 2016-12-19 — End: 2017-02-21

## 2016-12-19 MED ORDER — FUROSEMIDE 20 MG PO TABS: 20.0000 mg | ORAL_TABLET | Freq: Every day | ORAL | 3 refills | Status: DC | PRN

## 2016-12-19 NOTE — Telephone Encounter (Signed)
Paperwork faxed 12/17/16 Copy for file Copy for scan Copy for pt

## 2016-12-19 NOTE — Progress Notes (Signed)
Follow-up Outpatient Visit Date: 12/19/2016  Primary Care Provider: Eliezer Lofts, MD [place] [address]  Chief Complaint: Follow-up recent hospitalization for heart failure  HPI:  Mr. Bonneau is a 46 y.o. year-old male with history of chronic systolic heart failure secondary to NICM, HTN, and DM, who presents for follow-up after recent acute systolic heart failure exacerbation. The patient presented to [facility] on 12/04/16 with progressive shortness of breath and chest pain. EKG demonstrated left bundle branch block, new from previous tracings at [facility]. Emergent coronary angiography revealed no significant CAD but severely reduced LV contraction and mildly elevated LVEDP. The patient was managed medically for his heart failure exacerbation, with resolution of his symptoms. He was discharged on carvedilol and lisinopril. He was seen in the heart failure clinic earlier this week, at which time he was instructed to discontinue lisinopril and begin taking Entresto. Unfortunately, he discontinued carvedilol instead and began taking Entresto on top of lisinopril. Following his first dose yesterday, he began feeling jittery, lightheaded, and short of breath with mild chest tightness. His symptoms have improved significantly this morning. He has not had any significant edema or orthopnea, with the exception of last night. His weight has been stable. He is following a low salt diet. He is not exercising but able to do most activities around the house. However, he noted considerable fatigue washing his car a few weeks ago. He denies palpitations but at times feels like his equilibrium is off a little bit. This has gradually improved since leaving the hospital. He has not returned back to work. He is continuing to adjust his diabetes medications under the direction of Dr.  [name].  --------------------------------------------------------------------------------------------------  Cardiovascular History & Procedures: Cardiovascular Problems:  Chronic systolic heart failure secondary to nonischemic cardiomyopathy  Risk Factors:  Hypertension, diabetes mellitus, and male gender  Cath/PCI:  LHC (12/04/16): No angiographically significant CAD. Moderate to severe LV contractile dysfunction (LVEF 30-35%) with mildly elevated left ventricular filling pressure.  LHC (09/28/13, [facility]): No significant CAD. LVEF 45%.  CV Surgery:  None  EP Procedures and Devices:  None  Non-Invasive Evaluation(s):  TTE (12/05/16): Moderately dilated left ventricle with LVEF of 20-25% and global hypokinesis. Grade 1 diastolic dysfunction. Mild MR. Mild left atrial enlargement. Normal RV size and function.  TTE (04/14/15): Normal LV size with mild LVH. LVEF 50-55% with normal wall motion. Grade 1 diastolic dysfunction. Mild left atrial enlargement. Normal RV size and function. No significant valvular abnormalities.  Recent CV Pertinent Labs: Lab Results  Component Value Date   CHOL 121 10/07/2015   HDL 66.70 10/07/2015   LDLCALC 39 10/07/2015   TRIG 78.0 10/07/2015   CHOLHDL 2 10/07/2015   INR 1.08 12/04/2016   INR 1.0 08/04/2012   K 4.3 12/06/2016   K 3.9 08/04/2012   MG 1.9 12/06/2016   MG 2.0 08/04/2012   BUN 17 12/06/2016   BUN 21 (H) 08/04/2012   CREATININE 0.87 12/06/2016   CREATININE 0.98 11/23/2016    Past medical and surgical history were reviewed and updated in EPIC.  Outpatient Encounter Prescriptions as of 12/19/2016  Medication Sig  . aspirin 81 MG tablet Take 81 mg by mouth daily.  . calcium carbonate (OS-CAL) 600 MG TABS tablet Take 600 mg by mouth 2 (two) times daily with a meal.  . ferrous sulfate 325 (65 FE) MG tablet Take 1 tablet (325 mg total) by mouth 2 (two) times daily with a meal.  . glucose blood (ONE TOUCH  ULTRA TEST) test strip Check  blood sugar once daily and as instructed. Dx E11.9  . lisinopril (PRINIVIL,ZESTRIL) 2.5 MG tablet Take 2.5 mg by mouth daily.  . magnesium oxide (MAG-OX) 400 (241.3 Mg) MG tablet Take 1 tablet (400 mg total) by mouth daily.  . metFORMIN (GLUCOPHAGE-XR) 500 MG 24 hr tablet Take 4 tablets (2,000 mg total) by mouth daily with breakfast. (Patient taking differently: Take 1,000 mg by mouth 2 (two) times daily. )  . Multiple Vitamins-Minerals (MENS MULTI VITAMIN & MINERAL PO) Take 2 tablets by mouth daily. Men's Vita Fusion  . Omega-3 Fatty Acids (FISH OIL) 1200 MG CAPS Take 1 capsule by mouth daily.  Marland Kitchen omeprazole (PRILOSEC) 40 MG capsule Take 1 capsule (40 mg total) by mouth daily.  . sacubitril-valsartan (ENTRESTO) 24-26 MG Take 1 tablet by mouth 2 (two) times daily. (Patient not taking: Reported on 12/19/2016)  . [DISCONTINUED] carvedilol (COREG) 3.125 MG tablet Take 1 tablet (3.125 mg total) by mouth 2 (two) times daily with a meal. (Patient not taking: Reported on 12/19/2016)  . [DISCONTINUED] cyclobenzaprine (FLEXERIL) 5 MG tablet Take 5-10 mg by mouth at bedtime as needed for muscle spasms.  . [DISCONTINUED] glipiZIDE (GLUCOTROL XL) 10 MG 24 hr tablet Take 1 tablet (10 mg total) by mouth daily with breakfast. (Patient not taking: Reported on 12/19/2016)   No facility-administered encounter medications on file as of 12/19/2016.     Allergies: Mobic [meloxicam] and Diclofenac  Social History   Social History  . Marital status: Married    Spouse name: N/A  . Number of children: N/A  . Years of education: N/A   Occupational History  . 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 Smoking status: Never Smoker  . Smokeless tobacco: Never Used  . Alcohol use No  . Drug use: No  . Sexual activity: Yes   Other Topics Concern  . Not on file   Social History Narrative   Regular exercise-yes, walking one mile per day   Diet: fast food, diet soda,  unsweeted tea    Family History  Problem Relation Age of Onset  . Diabetes Mother   . Hypertension Mother   . Cancer Mother     breast  . Diabetes Father   . Hypertension Father   . Cancer Father     colon  . Cancer Maternal Grandfather     prostate  . Stroke Maternal Grandfather     CVA    Review of Systems: No melena, hematochezia, or hematemesis. He is receiving iron infusions. A 12-system review of systems was performed and was negative except as noted in the HPI.  --------------------------------------------------------------------------------------------------  Physical Exam: BP 120/68 (BP Location: Right Arm, Patient Position: Sitting, Cuff Size: Normal)   Pulse 85   Ht 6\' 1"  (1.854 m)   Wt 182 lb (82.6 kg)   BMI 24.01 kg/m   General:  Well-developed, well-nourished man, seated after bleeding in the exam room. He is accompanied by his wife. HEENT: Conjunctival pallor is noted. No scleral icterus.  Moist mucous membranes.  OP clear. Neck: Supple without lymphadenopathy, thyromegaly, JVD, or HJR.  No carotid bruit. Lungs: Normal work of breathing.  Clear to auscultation bilaterally without wheezes or crackles. Heart: Regular rate and rhythm with S3 present. No murmurs or rubs..  Non-displaced PMI. Abd: Bowel sounds present.  Soft, NT/ND without hepatosplenomegaly Ext: No lower extremity edema.  Radial, PT, and DP pulses are 2+ bilaterally. Right  radial arteriotomy site is well-healed. Skin: warm and dry without rash  EKG:  Normal sinus rhythm with left bundle branch block and right axis deviation. No significant change from prior tracing on 12/04/16.  Lab Results  Component Value Date   WBC 5.0 12/18/2016   HGB 9.5 (L) 12/18/2016   HCT 29.4 (L) 12/18/2016   MCV 86.8 12/18/2016   PLT 249 12/18/2016    Lab Results  Component Value Date   NA 137 12/06/2016   K 4.3 12/06/2016   CL 101 12/06/2016   CO2 30 12/06/2016   BUN 17 12/06/2016   CREATININE 0.87  12/06/2016   GLUCOSE 206 (H) 12/06/2016   ALT 15 01/03/2016    Lab Results  Component Value Date   CHOL 121 10/07/2015   HDL 66.70 10/07/2015   LDLCALC 39 10/07/2015   TRIG 78.0 10/07/2015   CHOLHDL 2 10/07/2015   --------------------------------------------------------------------------------------------------  ASSESSMENT AND PLAN: Chronic systolic heart failure secondary to nonischemic cardiomyopathy Patient appears euvolemic on exam today with NYHA class II-III symptoms. He was recently started on Entresto but unfortunately did not stop lisinopril; he discontinued carvedilol instead. We have discussed the rationale behind these medications and have agreed to restart carvedilol 3.125 mg twice a day. He should not take any more lisinopril. I will have him restart Entresto in 2 days. I instructed Mr. Milstein to continue monitoring his weight. I provided him with a prescription for furosemide 20 mg to take daily as needed for weight gain of more than 2 pounds in 24 hours or 5 pounds in a week. We will also make a referral to cardiac rehabilitation.  Anemia with recent GI bleed No further bleeding. Hemoglobin gradually improving. Given lack of CAD, we have agreed to discontinue aspirin.  Essential hypertension Blood pressure is normal today. We will continue with medications, as described above.  Type 2 diabetes mellitus Defer management to Dr. [name].  Follow-up: Return to clinic in 2 months; patient scheduled to be seen in heart failure clinic in 1 month.  [name], MD 12/19/2016 8:35 AM

## 2016-12-19 NOTE — Patient Instructions (Signed)
Medication Instructions:  Your physician has recommended you make the following change in your medication:  1- STOP TAKING Aspirin. 2- STOP TAKING Lisinopril. 2- RESTART taking Carvedilol 3.125 mg (1 tablet) by mouth two times a day. 4- TAKE Furosemide 20 mg (1 tablet) by mouth once a day AS NEEDED FOR > 2 lb. weight gain over 1 day or   > 5 lb. Weight gain for 1 week.   Labwork: none  Testing/Procedures: none  Follow-Up: Your physician recommends that you schedule a follow-up appointment in: 2 MONTHS WITH DR END.  If you need a refill on your cardiac medications before your next appointment, please call your pharmacy.

## 2016-12-20 ENCOUNTER — Ambulatory Visit: Payer: 59 | Admitting: Gastroenterology

## 2016-12-21 ENCOUNTER — Telehealth: Payer: Self-pay | Admitting: Internal Medicine

## 2016-12-21 NOTE — Telephone Encounter (Signed)
Spoke with patient and he states that when he got up out of bed this morning he was very dizzy and just did not feel well. He denied any other symptoms and he did not check his blood pressure when the event occurred. Instructed him to start monitoring his blood pressures on a regular basis and use caution when changing different positions as this can also cause blood pressure changes. Instructed him to call if symptoms persist or worsen and to routinely monitor blood pressures. He verbalized understanding of our conversation, agreement with plan, and had no further questions at this time.

## 2016-12-21 NOTE — Telephone Encounter (Signed)
Pt c/o medication issue:  1. Name of Medication: entresto   2. How are you currently taking this medication (dosage and times per day)? Supposed to take it 2x a day 24-26 mg  Only took one pill this morning today   3. Are you having a reaction (difficulty breathing--STAT)? No   4. What is your medication issue?  Just took his first pill this morning and feels very disoriented  He is laying on the bed because of that odd feeling he is having Would like to know what to do  Please call back

## 2016-12-24 ENCOUNTER — Telehealth: Payer: Self-pay | Admitting: *Deleted

## 2016-12-24 NOTE — Telephone Encounter (Signed)
Pt states he was recently seen and written out of work on disability, to return on 4/4. Pts f/u appt is 4/5 and is requesting a letter to state that he is to stay out of work until after he is seen on 4/5. Pt advised that Dr [name] is out of office until 4/3; no available appts to schedule pt sooner. pls advise

## 2016-12-25 ENCOUNTER — Encounter: Payer: Self-pay | Admitting: *Deleted

## 2016-12-25 ENCOUNTER — Inpatient Hospital Stay: Payer: 59 | Attending: Oncology

## 2016-12-25 VITALS — BP 111/71 | HR 74 | Temp 97.1°F | Resp 18

## 2016-12-25 DIAGNOSIS — D500 Iron deficiency anemia secondary to blood loss (chronic): Secondary | ICD-10-CM

## 2016-12-25 DIAGNOSIS — Z9884 Bariatric surgery status: Secondary | ICD-10-CM | POA: Insufficient documentation

## 2016-12-25 DIAGNOSIS — D509 Iron deficiency anemia, unspecified: Secondary | ICD-10-CM | POA: Diagnosis not present

## 2016-12-25 MED ORDER — SODIUM CHLORIDE 0.9 % IV SOLN
510.0000 mg | Freq: Once | INTRAVENOUS | Status: AC
  Administered 2016-12-25: 510 mg via INTRAVENOUS
  Filled 2016-12-25: qty 17

## 2016-12-25 MED ORDER — SODIUM CHLORIDE 0.9 % IV SOLN
Freq: Once | INTRAVENOUS | Status: AC
  Administered 2016-12-25: 09:00:00 via INTRAVENOUS
  Filled 2016-12-25: qty 1000

## 2016-12-25 NOTE — Telephone Encounter (Signed)
Pt brought original form into office and date updated per Dr [name].

## 2016-12-25 NOTE — Telephone Encounter (Signed)
Okay to provide letter to keep pt out until 4/5 or give me the paperwork to complete.

## 2016-12-25 NOTE — Telephone Encounter (Signed)
[name] notified that his letter is ready to be picked up at the front desk.  He states something might need to be sent to disability as well.  Will forward to Philip Ponce to see if she can update disability paperwork to keep him out of work until 12/27/16.

## 2016-12-27 ENCOUNTER — Encounter: Payer: Self-pay | Admitting: Family Medicine

## 2016-12-27 ENCOUNTER — Ambulatory Visit (INDEPENDENT_AMBULATORY_CARE_PROVIDER_SITE_OTHER): Payer: 59 | Admitting: Family Medicine

## 2016-12-27 VITALS — BP 104/68 | HR 77 | Temp 98.6°F | Ht 71.5 in | Wt 179.5 lb

## 2016-12-27 DIAGNOSIS — D509 Iron deficiency anemia, unspecified: Secondary | ICD-10-CM | POA: Diagnosis not present

## 2016-12-27 DIAGNOSIS — I5022 Chronic systolic (congestive) heart failure: Secondary | ICD-10-CM | POA: Diagnosis not present

## 2016-12-27 DIAGNOSIS — E119 Type 2 diabetes mellitus without complications: Secondary | ICD-10-CM | POA: Diagnosis not present

## 2016-12-27 DIAGNOSIS — I10 Essential (primary) hypertension: Secondary | ICD-10-CM

## 2016-12-27 MED ORDER — INSULIN GLARGINE 300 UNIT/ML ~~LOC~~ SOPN: 10.0000 [IU] | PEN_INJECTOR | Freq: Every day | SUBCUTANEOUS | 11 refills | Status: DC

## 2016-12-27 NOTE — Assessment & Plan Note (Signed)
receiving IV iron infusions per heme

## 2016-12-27 NOTE — Assessment & Plan Note (Signed)
Good control on new regimen. Pt SE have improve the longer he has been on entresto.

## 2016-12-27 NOTE — Progress Notes (Signed)
   Subjective:    Patient ID: Philip Ponce, male    DOB: 10-14-1970, 46 y.o.   MRN: [id number]  HPI    46 year old male presents for follow up DM and HTN.  Hypertension:  On entresto and coreg given additional dx of CHF. BP Readings from Last 3 Encounters:  12/27/16 104/68  12/25/16 111/71  12/19/16 120/68  Using medication without problems or lightheadedness:  Initially, better Chest pain with exertion: none Edema:none Short of breath:none Average home BPs: good control per wife. Other issues:  Diabetes:  At last OV increased metformin to max. Stopped glucotrol. He was on insulin in past. Using medications without difficulties: Hypoglycemic episodes: none Hyperglycemic episodes:yes Feet problems:none Blood Sugars averaging: FBS 197-240   CHF followed by  heart failure clinic and cardiology. Recently started on entresto.  Iron def anemia: now on iron infusions per heme. Has had x 2 . He has felt more energy.   Has follow up GI [name] on 4/11. He is set up for cardiac reheab.  Review of Systems  Constitutional: Negative for fatigue and fever.  HENT: Negative for ear pain.   Eyes: Negative for pain.  Respiratory: Negative for cough and shortness of breath.   Cardiovascular: Negative for chest pain, palpitations and leg swelling.  Gastrointestinal: Negative for abdominal pain.  Genitourinary: Negative for dysuria.  Musculoskeletal: Negative for arthralgias.  Neurological: Negative for syncope, light-headedness and headaches.  Psychiatric/Behavioral: Negative for dysphoric mood.       Objective:   Physical Exam  Constitutional: Vital signs are normal. He appears well-developed and well-nourished.  HENT:  Head: Normocephalic.  Right Ear: Hearing normal.  Left Ear: Hearing normal.  Nose: Nose normal.  Mouth/Throat: Oropharynx is clear and moist and mucous membranes are normal.  Neck: Trachea normal. Carotid bruit is not present. No thyroid mass and no  thyromegaly present.  Cardiovascular: Normal rate, regular rhythm and normal pulses.  Exam reveals no gallop, no distant heart sounds and no friction rub.   No murmur heard. No peripheral edema  Pulmonary/Chest: Effort normal and breath sounds normal. No respiratory distress.  Skin: Skin is warm, dry and intact. No rash noted.  Psychiatric: He has a normal mood and affect. His speech is normal and behavior is normal. Thought content normal.          Assessment & Plan:

## 2016-12-27 NOTE — Assessment & Plan Note (Signed)
Continue max metformin . Start toujeo 10 uoits daily and will titrate up until FBS at goal. Pt to call with FBS in 1-2 weeks.

## 2016-12-27 NOTE — Patient Instructions (Addendum)
Start insulin as directed 10 units daily. Call or email with fasting blood sugars  In 2 weeks.   Pt is released to return to work with no restrictions on 12/31/2016.

## 2016-12-27 NOTE — Assessment & Plan Note (Signed)
Euvolemic today. 

## 2016-12-27 NOTE — Progress Notes (Signed)
Pre visit review using our clinic review tool, if applicable. No additional management support is needed unless otherwise documented below in the visit note. 

## 2016-12-28 NOTE — Telephone Encounter (Signed)
Mrs Kloepfer left v/m wanting answer to email; should pt continue metformin with insulin. Request cb.

## 2016-12-31 ENCOUNTER — Encounter: Payer: Self-pay | Admitting: Family Medicine

## 2016-12-31 ENCOUNTER — Ambulatory Visit (INDEPENDENT_AMBULATORY_CARE_PROVIDER_SITE_OTHER): Payer: 59 | Admitting: Podiatry

## 2016-12-31 DIAGNOSIS — B351 Tinea unguium: Secondary | ICD-10-CM | POA: Diagnosis not present

## 2016-12-31 DIAGNOSIS — E119 Type 2 diabetes mellitus without complications: Secondary | ICD-10-CM

## 2016-12-31 DIAGNOSIS — M79676 Pain in unspecified toe(s): Secondary | ICD-10-CM

## 2016-12-31 NOTE — Progress Notes (Signed)
Complaint:  Visit Type: Patient returns to my office for continued preventative foot care services. Complaint: Patient states" my nails have grown long and thick and become painful to walk and wear shoes" Patient has been diagnosed with DM with no foot complications. The patient presents for preventative foot care services. No changes to ROS  Podiatric Exam: Vascular: dorsalis pedis and posterior tibial pulses are palpable bilateral. Capillary return is immediate. Temperature gradient is WNL. Skin turgor WNL  Sensorium: Normal Semmes Weinstein monofilament test. Normal tactile sensation bilaterally. Nail Exam: Pt has thick disfigured discolored nails with subungual debris noted bilateral entire nail hallux through fifth toenails Ulcer Exam: There is no evidence of ulcer or pre-ulcerative changes or infection. Orthopedic Exam: Muscle tone and strength are WNL. No limitations in general ROM. No crepitus or effusions noted. Foot type and digits show no abnormalities. Bony prominences are unremarkable. HAV  B/L.  Pes planus.  Skin: No Porokeratosis. No infection or ulcers  Diagnosis:  Onychomycosis, , Pain in right toe, pain in left toes  Treatment & Plan Procedures and Treatment: Consent by patient was obtained for treatment procedures. The patient understood the discussion of treatment and procedures well. All questions were answered thoroughly reviewed. Debridement of mycotic and hypertrophic toenails, 1 through 5 bilateral and clearing of subungual debris. No ulceration, no infection noted.  Return Visit-Office Procedure: Patient instructed to return to the office for a follow up visit 4 months for continued evaluation and treatment.    Gardiner Barefoot DPM

## 2017-01-02 ENCOUNTER — Encounter: Payer: Self-pay | Admitting: Gastroenterology

## 2017-01-02 ENCOUNTER — Ambulatory Visit (INDEPENDENT_AMBULATORY_CARE_PROVIDER_SITE_OTHER): Payer: 59 | Admitting: Gastroenterology

## 2017-01-02 VITALS — BP 128/83 | HR 85 | Temp 98.6°F | Ht 71.5 in | Wt 186.8 lb

## 2017-01-02 DIAGNOSIS — D509 Iron deficiency anemia, unspecified: Secondary | ICD-10-CM | POA: Diagnosis not present

## 2017-01-02 NOTE — Progress Notes (Signed)
Primary Care Physician: Eliezer Lofts, MD  Primary Gastroenterologist:  Dr. [name]   Chief Complaint  Patient presents with  . Follow up 4 weeks  . Iron deficiency anemia  . Gastritis    HPI: Philip Ponce is a 46 y.o. male. He is here today to follow . I last saw him back on 11/28/16 .  Summary of history : Initially referred for dark stools. He was seen at the ER on 11/20/16 for melena as well as bright red blood per rectum . At that time he also had epigastric pain . At that time he was on NSAID's and was advised to stop and was commenced on a PPI. 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  Hb 11/2016 8.3 grams with MCV 90   Interval history   3//2018-  01/02/2017   Fount to be severe iron deficiency  H pylori stool antigen negative . B12,folate TSH-normal EGD+colonoscopy was negative   He recently had a ST elevation MI biut cardaic cath was normal .   Receiving IV iron . Iron studies have improved . Hb 9.5 2 weeks back.Doing well , no more black stools.     Current Outpatient Prescriptions  Medication Sig Dispense Refill  . calcium carbonate (OS-CAL) 600 MG TABS tablet Take 600 mg by mouth 2 (two) times daily with a meal.    . carvedilol (COREG) 3.125 MG tablet Take 1 tablet (3.125 mg total) by mouth 2 (two) times daily. 180 tablet 3  . ferrous sulfate 325 (65 FE) MG tablet Take 1 tablet (325 mg total) by mouth 2 (two) times daily with a meal. 60 tablet 3  . furosemide (LASIX) 20 MG tablet Take 1 tablet (20 mg total) by mouth daily as needed. Take for > 2lb weight gain in 1 day or > 5lb weight gain in 1 week. 90 tablet 3  . glucose blood (ONE TOUCH ULTRA TEST) test strip Check blood sugar once daily and as instructed. Dx E11.9 100 each 1  . Insulin Glargine (TOUJEO SOLOSTAR) 300 UNIT/ML SOPN Inject 10 Units into the skin daily. 1.5 mL 11  . magnesium oxide (MAG-OX)  400 (241.3 Mg) MG tablet Take 1 tablet (400 mg total) by mouth daily. 30 tablet 0  . metFORMIN (GLUCOPHAGE-XR) 500 MG 24 hr tablet Take 4 tablets (2,000 mg total) by mouth daily with breakfast. (Patient taking differently: Take 1,000 mg by mouth 2 (two) times daily. ) 120 tablet 11  . Multiple Vitamins-Minerals (MENS MULTI VITAMIN & MINERAL PO) Take 2 tablets by mouth daily. Men's Vita Fusion    . Omega-3 Fatty Acids (FISH OIL) 1200 MG CAPS Take 1 capsule by mouth daily.    . sacubitril-valsartan (ENTRESTO) 24-26 MG Take 1 tablet by mouth 2 (two) times daily. 60 tablet 5  . omeprazole (PRILOSEC) 40 MG capsule Take 1 capsule (40 mg total) by mouth daily. 30 capsule 0   No current facility-administered medications for this visit.     Allergies as of 01/02/2017 - Review Complete 01/02/2017  Allergen Reaction Noted  . Mobic [meloxicam] Other (See Comments) 11/23/2016  . Diclofenac Other (See Comments) 11/20/2016    ROS:  General: Negative for anorexia, weight loss, fever, chills, fatigue, weakness. ENT: Negative for hoarseness, difficulty swallowing , nasal congestion. CV: Negative for chest pain, angina, palpitations, dyspnea on exertion, peripheral edema.  Respiratory:  Negative for dyspnea at rest, dyspnea on exertion, cough, sputum, wheezing.  GI: See history of present illness. GU:  Negative for dysuria, hematuria, urinary incontinence, urinary frequency, nocturnal urination.  Endo: Negative for unusual weight change.    Physical Examination:   BP 128/83   Pulse 85   Temp 98.6 F (37 C) (Oral)   Ht 5' 11.5" (1.816 m)   Wt 186 lb 12.8 oz (84.7 kg)   BMI 25.69 kg/m   General: Well-nourished, well-developed in no acute distress.  Eyes: No icterus. Conjunctivae pink. Mouth: Oropharyngeal mucosa moist and pink , no lesions erythema or exudate. Lungs: Clear to auscultation bilaterally. Non-labored. Heart: Regular rate and rhythm, no murmurs rubs or gallops.  Abdomen: Bowel sounds  are normal, nontender, nondistended, no hepatosplenomegaly or masses, no abdominal bruits or hernia , no rebound or guarding.   Extremities: No lower extremity edema. No clubbing or deformities. Neuro: Alert and oriented x 3.  Grossly intact. Skin: Warm and dry, no jaundice.   Psych: Alert and cooperative, normal mood and affect.   Imaging Studies: Dg Chest Portable 1 View  Result Date: 12/04/2016 CLINICAL DATA:  Patient reports SOB and CP. Hx DM. Non-smoker. EXAM: PORTABLE CHEST 1 VIEW COMPARISON:  01/31/2016 FINDINGS: External pacer/defibrillator. Midline trachea. Borderline cardiomegaly. No pleural effusion or pneumothorax. Low lung volumes. Mild pulmonary interstitial prominence and indistinctness. No lobar consolidation. IMPRESSION: Borderline cardiomegaly with mild pulmonary venous congestion. Electronically Signed   By: Abigail Miyamoto M.D.   On: 12/04/2016 18:34    Assessment and Plan:   Philip Ponce is a 46 y.o. y/o male here to follow up for melena . H/o gastric by pass,He also had severe iron deficiency . Likely EGD+colonoscopy was negative, proceed with capsule study of the small bowel   Plan   1. No NSAID's 2. Capsule study  3. Iron studies after capsule study  4. Stop iron pills 5. Can resume asprin if needed by cardiology   Dr [name]  MD Follow up in 10 weeks.

## 2017-01-03 ENCOUNTER — Encounter: Payer: Self-pay | Admitting: Family Medicine

## 2017-01-03 ENCOUNTER — Other Ambulatory Visit: Payer: Self-pay

## 2017-01-03 DIAGNOSIS — D509 Iron deficiency anemia, unspecified: Secondary | ICD-10-CM

## 2017-01-04 ENCOUNTER — Encounter: Payer: Self-pay | Admitting: Family Medicine

## 2017-01-04 ENCOUNTER — Telehealth: Payer: Self-pay | Admitting: Gastroenterology

## 2017-01-04 NOTE — Telephone Encounter (Signed)
01/04/17 Spoke with Lenna Sciara at [facility] for Capsule Study 91110 / D50.9. Auth #: [id number]. Faxed clinicals to [phone].

## 2017-01-07 ENCOUNTER — Ambulatory Visit
Admission: RE | Admit: 2017-01-07 | Discharge: 2017-01-07 | Disposition: A | Discharge status: Home / Self Care | Payer: 59 | Source: Ambulatory Visit | Attending: Gastroenterology | Admitting: Gastroenterology

## 2017-01-07 ENCOUNTER — Encounter
Admission: RE | Disposition: A | Discharge status: Home / Self Care | Payer: Self-pay | Source: Ambulatory Visit | Attending: Gastroenterology

## 2017-01-07 ENCOUNTER — Other Ambulatory Visit: Payer: Self-pay

## 2017-01-07 DIAGNOSIS — D509 Iron deficiency anemia, unspecified: Secondary | ICD-10-CM | POA: Insufficient documentation

## 2017-01-07 HISTORY — PX: GIVENS CAPSULE STUDY: SHX5432

## 2017-01-07 SURGERY — IMAGING PROCEDURE, GI TRACT, INTRALUMINAL, VIA CAPSULE

## 2017-01-07 NOTE — Telephone Encounter (Signed)
Pt left v/m pt is out of magnesium oxide; pt request refill if necessary; doctor in hospital prescribed. Pt last seen 12/27/16.[facility] [address]Please advise.

## 2017-01-08 ENCOUNTER — Encounter: Payer: Self-pay | Admitting: Gastroenterology

## 2017-01-08 NOTE — Telephone Encounter (Signed)
Kamuela notified by telephone okay to stop Magnesium Oxide per Dr. [name].

## 2017-01-08 NOTE — Telephone Encounter (Signed)
Okay to stop

## 2017-01-14 ENCOUNTER — Ambulatory Visit: Payer: 59 | Attending: Family | Admitting: Family

## 2017-01-14 ENCOUNTER — Encounter: Payer: Self-pay | Admitting: Family Medicine

## 2017-01-14 ENCOUNTER — Encounter: Payer: 59 | Attending: Internal Medicine | Admitting: *Deleted

## 2017-01-14 ENCOUNTER — Encounter: Payer: Self-pay | Admitting: Family

## 2017-01-14 VITALS — BP 128/67 | HR 80 | Resp 20 | Ht 73.0 in | Wt 186.0 lb

## 2017-01-14 VITALS — Ht 72.8 in | Wt 183.3 lb

## 2017-01-14 DIAGNOSIS — Z800 Family history of malignant neoplasm of digestive organs: Secondary | ICD-10-CM | POA: Diagnosis not present

## 2017-01-14 DIAGNOSIS — Z794 Long term (current) use of insulin: Secondary | ICD-10-CM | POA: Insufficient documentation

## 2017-01-14 DIAGNOSIS — I252 Old myocardial infarction: Secondary | ICD-10-CM | POA: Diagnosis not present

## 2017-01-14 DIAGNOSIS — Z803 Family history of malignant neoplasm of breast: Secondary | ICD-10-CM | POA: Diagnosis not present

## 2017-01-14 DIAGNOSIS — Z823 Family history of stroke: Secondary | ICD-10-CM | POA: Diagnosis not present

## 2017-01-14 DIAGNOSIS — Z9884 Bariatric surgery status: Secondary | ICD-10-CM | POA: Insufficient documentation

## 2017-01-14 DIAGNOSIS — I110 Hypertensive heart disease with heart failure: Secondary | ICD-10-CM | POA: Insufficient documentation

## 2017-01-14 DIAGNOSIS — Z888 Allergy status to other drugs, medicaments and biological substances status: Secondary | ICD-10-CM | POA: Insufficient documentation

## 2017-01-14 DIAGNOSIS — I5022 Chronic systolic (congestive) heart failure: Secondary | ICD-10-CM | POA: Insufficient documentation

## 2017-01-14 DIAGNOSIS — E119 Type 2 diabetes mellitus without complications: Secondary | ICD-10-CM

## 2017-01-14 DIAGNOSIS — Z9889 Other specified postprocedural states: Secondary | ICD-10-CM | POA: Insufficient documentation

## 2017-01-14 DIAGNOSIS — I509 Heart failure, unspecified: Secondary | ICD-10-CM | POA: Diagnosis present

## 2017-01-14 DIAGNOSIS — I10 Essential (primary) hypertension: Secondary | ICD-10-CM | POA: Insufficient documentation

## 2017-01-14 DIAGNOSIS — Z79899 Other long term (current) drug therapy: Secondary | ICD-10-CM | POA: Insufficient documentation

## 2017-01-14 DIAGNOSIS — Z833 Family history of diabetes mellitus: Secondary | ICD-10-CM | POA: Insufficient documentation

## 2017-01-14 DIAGNOSIS — Z8042 Family history of malignant neoplasm of prostate: Secondary | ICD-10-CM | POA: Diagnosis not present

## 2017-01-14 DIAGNOSIS — Z8249 Family history of ischemic heart disease and other diseases of the circulatory system: Secondary | ICD-10-CM | POA: Insufficient documentation

## 2017-01-14 DIAGNOSIS — E109 Type 1 diabetes mellitus without complications: Secondary | ICD-10-CM | POA: Diagnosis not present

## 2017-01-14 DIAGNOSIS — I213 ST elevation (STEMI) myocardial infarction of unspecified site: Secondary | ICD-10-CM

## 2017-01-14 DIAGNOSIS — I2510 Atherosclerotic heart disease of native coronary artery without angina pectoris: Secondary | ICD-10-CM | POA: Insufficient documentation

## 2017-01-14 LAB — BASIC METABOLIC PANEL
Anion gap: 4 — ABNORMAL LOW (ref 5–15)
BUN: 18 mg/dL (ref 6–20)
CALCIUM: 8.7 mg/dL — AB (ref 8.9–10.3)
CO2: 28 mmol/L (ref 22–32)
CREATININE: 0.88 mg/dL (ref 0.61–1.24)
Chloride: 106 mmol/L (ref 101–111)
GFR calc Af Amer: >60 mL/min (ref >60)
GLUCOSE: 135 mg/dL — AB (ref 65–99)
Potassium: 4.1 mmol/L (ref 3.5–5.1)
Sodium: 138 mmol/L (ref 135–145)

## 2017-01-14 MED ORDER — SACUBITRIL-VALSARTAN 24-26 MG PO TABS: 1.0000 | ORAL_TABLET | Freq: Two times a day (BID) | ORAL | 5 refills | Status: DC

## 2017-01-14 NOTE — Patient Instructions (Signed)
Patient Instructions  Patient Details  Name: Philip Ponce MRN: [id number] Date of Birth: 11-12-1970 Referring Provider:  [name], MD  Below are the personal goals you chose as well as exercise and nutrition goals. Our goal is to help you keep on track towards obtaining and maintaining your goals. We will be discussing your progress on these goals with you throughout the program.  Initial Exercise Prescription:     Initial Exercise Prescription - 01/14/17 1400      Date of Initial Exercise RX and Referring Provider   Date 01/14/17   Referring Provider End, Harrell Gave MD     Treadmill   MPH 3   Grade 0.5   Minutes 15   METs 3.5     Elliptical   Level 1   Speed 4.5   Minutes 15     T5 Nustep   Level 3   SPM 80   Minutes 15   METs 2     Prescription Details   Frequency (times per week) 2   Duration Progress to 45 minutes of aerobic exercise without signs/symptoms of physical distress     Intensity   THRR 40-80% of Max Heartrate 114-153   Ratings of Perceived Exertion 11-13   Perceived Dyspnea 0-4     Progression   Progression Continue to progress workloads to maintain intensity without signs/symptoms of physical distress.     Resistance Training   Training Prescription Yes   Weight 4 lbs   Reps 10-15      Exercise Goals: Frequency: Be able to perform aerobic exercise three times per week working toward 3-5 days per week.  Intensity: Work with a perceived exertion of 11 (fairly light) - 15 (hard) as tolerated. Follow your new exercise prescription and watch for changes in prescription as you progress with the program. Changes will be reviewed with you when they are made.  Duration: You should be able to do 30 minutes of continuous aerobic exercise in addition to a 5 minute warm-up and a 5 minute cool-down routine.  Nutrition Goals: Your personal nutrition goals will be established when you do your nutrition analysis with the dietician.  The  following are nutrition guidelines to follow: Cholesterol < 200mg /day Sodium < 1500mg /day Fiber: Men under 50 yrs - 38 grams per day  Personal Goals:     Personal Goals and Risk Factors at Admission - 01/14/17 1311      Core Components/Risk Factors/Patient Goals on Admission    Weight Management Yes;Weight Maintenance   Intervention Weight Management: Develop a combined nutrition and exercise program designed to reach desired caloric intake, while maintaining appropriate intake of nutrient and fiber, sodium and fats, and appropriate energy expenditure required for the weight goal.;Weight Management: Provide education and appropriate resources to help participant work on and attain dietary goals.   Admit Weight 183 lb 4.8 oz (83.1 kg)   Expected Outcomes Short Term: Continue to assess and modify interventions until short term weight is achieved;Long Term: Adherence to nutrition and physical activity/exercise program aimed toward attainment of established weight goal;Weight Maintenance: Understanding of the daily nutrition guidelines, which includes 25-35% calories from fat, 7% or less cal from saturated fats, less than 200mg  cholesterol, less than 1.5gm of sodium, & 5 or more servings of fruits and vegetables daily   Diabetes Yes   Intervention Provide education about signs/symptoms and action to take for hypo/hyperglycemia.;Provide education about proper nutrition, including hydration, and aerobic/resistive exercise prescription along with prescribed medications to achieve blood glucose  in normal ranges: Fasting glucose 65-99 mg/dL   Expected Outcomes Short Term: Participant verbalizes understanding of the signs/symptoms and immediate care of hyper/hypoglycemia, proper foot care and importance of medication, aerobic/resistive exercise and nutrition plan for blood glucose control.;Long Term: Attainment of HbA1C < 7%.   Heart Failure Yes   Intervention Provide a combined exercise and nutrition  program that is supplemented with education, support and counseling about heart failure. Directed toward relieving symptoms such as shortness of breath, decreased exercise tolerance, and extremity edema.   Expected Outcomes Improve functional capacity of life;Short term: Attendance in program 2-3 days a week with increased exercise capacity. Reported lower sodium intake. Reported increased fruit and vegetable intake. Reports medication compliance.;Short term: Daily weights obtained and reported for increase. Utilizing diuretic protocols set by physician.;Long term: Adoption of self-care skills and reduction of barriers for early signs and symptoms recognition and intervention leading to self-care maintenance.   Stress Yes  His Mother was in the hospital same time he was   Intervention Offer individual and/or small group education and counseling on adjustment to heart disease, stress management and health-related lifestyle change. Teach and support self-help strategies.;Refer participants experiencing significant psychosocial distress to appropriate mental health specialists for further evaluation and treatment. When possible, include family members and significant others in education/counseling sessions.   Expected Outcomes Short Term: Participant demonstrates changes in health-related behavior, relaxation and other stress management skills, ability to obtain effective social support, and compliance with psychotropic medications if prescribed.;Long Term: Emotional wellbeing is indicated by absence of clinically significant psychosocial distress or social isolation.      Tobacco Use Initial Evaluation: History  Smoking Status  . Never Smoker  Smokeless Tobacco  . Never Used    Copy of goals given to participant.

## 2017-01-14 NOTE — Progress Notes (Signed)
edit

## 2017-01-14 NOTE — Progress Notes (Signed)
Daily Session Note  Patient Details  Name: Philip Ponce MRN: [id number] Date of Birth: 10-27-1970 Referring Provider:    Encounter Date: 01/14/2017  Check In:     Session Check In - 01/14/17 1121      Check-In   Location [facility]   Staff Present Gerlene Burdock, RN, Levie Heritage, MA, ACSM RCEP, Exercise Physiologist   Supervising physician immediately available to respond to emergencies See telemetry face sheet for immediately available ER MD   Medication changes reported     No   Fall or balance concerns reported    No   Warm-up and Cool-down Performed as group-led instruction   Resistance Training Performed Yes   VAD Patient? No     Pain Assessment   Currently in Pain? No/denies         History  Smoking Status  . Never Smoker  Smokeless Tobacco  . Never Used    Goals Met:  Personal goals reviewed  No c/o chest pain etc  Goals Unmet:  Not Applicable  Comments:     Dr. Emily Filbert is Medical Director for [place] and [facility].

## 2017-01-14 NOTE — Progress Notes (Signed)
Cardiac Individual Treatment Plan  Patient Details  Name: Philip Ponce MRN: [id number] Date of Birth: 04-Oct-1970 Referring Provider:     Cardiac Rehab from 01/14/2017 in [facility] Cardiac and Pulmonary Rehab  Referring Provider  End, Harrell Gave MD      Initial Encounter Date:    Cardiac Rehab from 01/14/2017 in [facility] Cardiac and Pulmonary Rehab  Date  01/14/17  Referring Provider  End, Harrell Gave MD      Visit Diagnosis: ST elevation myocardial infarction (STEMI), unspecified artery ([place])  Heart failure, chronic systolic ([place])  Patient's Home Medications on Admission:  Current Outpatient Prescriptions:  .  calcium carbonate (OS-CAL) 600 MG TABS tablet, Take 600 mg by mouth 2 (two) times daily with a meal., Disp: , Rfl:  .  carvedilol (COREG) 3.125 MG tablet, Take 1 tablet (3.125 mg total) by mouth 2 (two) times daily., Disp: 180 tablet, Rfl: 3 .  furosemide (LASIX) 20 MG tablet, Take 1 tablet (20 mg total) by mouth daily as needed. Take for > 2lb weight gain in 1 day or > 5lb weight gain in 1 week., Disp: 90 tablet, Rfl: 3 .  glucose blood (ONE TOUCH ULTRA TEST) test strip, Check blood sugar once daily and as instructed. Dx E11.9, Disp: 100 each, Rfl: 1 .  Insulin Glargine (TOUJEO SOLOSTAR) 300 UNIT/ML SOPN, Inject 10 Units into the skin daily. (Patient taking differently: Inject 15 Units into the skin daily. ), Disp: 1.5 mL, Rfl: 11 .  metFORMIN (GLUCOPHAGE-XR) 500 MG 24 hr tablet, Take 4 tablets (2,000 mg total) by mouth daily with breakfast. (Patient taking differently: Take 1,000 mg by mouth 2 (two) times daily. ), Disp: 120 tablet, Rfl: 11 .  Multiple Vitamins-Minerals (MENS MULTI VITAMIN & MINERAL PO), Take 2 tablets by mouth daily. Men's Vita Fusion, Disp: , Rfl:  .  Omega-3 Fatty Acids (FISH OIL) 1200 MG CAPS, Take 1 capsule by mouth daily., Disp: , Rfl:  .  omeprazole (PRILOSEC) 40 MG capsule, Take 1 capsule (40 mg total) by mouth daily., Disp: 30 capsule, Rfl: 0 .   sacubitril-valsartan (ENTRESTO) 24-26 MG, Take 1 tablet by mouth 2 (two) times daily., Disp: 60 tablet, Rfl: 5  Past Medical History: Past Medical History:  Diagnosis Date  . Acute blood loss anemia 11/20/2016  . CHF (congestive heart failure) ([place])   . Coronary artery disease   . Diabetes mellitus type I ([place])   . Slipped intervertebral disc    L4 L5  . STEMI (ST elevation myocardial infarction) ([place])     Tobacco Use: History  Smoking Status  . Never Smoker  Smokeless Tobacco  . Never Used    Labs: Recent Review Flowsheet Data    Labs for ITP Cardiac and Pulmonary Rehab Latest Ref Rng & Units 01/01/2013 04/15/2015 10/07/2015 01/03/2016 11/27/2016   Cholestrol 0 - 200 mg/dL - 101 121 - -   LDLCALC 0 - 99 mg/dL - 38 39 - -   HDL >39.00 mg/dL - 51.40 66.70 - -   Trlycerides 0.0 - 149.0 mg/dL - 58.0 78.0 - -   Hemoglobin A1c 4.6 - 6.5 % 8.6(H) 7.0(H) 10.5(H) 10.6(H) 9.6(H)       Exercise Target Goals: Date: 01/14/17  Exercise Program Goal: Individual exercise prescription set with THRR, safety & activity barriers. Participant demonstrates ability to understand and report RPE using BORG scale, to self-measure pulse accurately, and to acknowledge the importance of the exercise prescription.  Exercise Prescription Goal: Starting with aerobic activity 30 plus minutes a  day, 3 days per week for initial exercise prescription. Provide home exercise prescription and guidelines that participant acknowledges understanding prior to discharge.  Activity Barriers & Risk Stratification:     Activity Barriers & Cardiac Risk Stratification - 01/14/17 1311      Activity Barriers & Cardiac Risk Stratification   Activity Barriers Balance Concerns;Decreased Ventricular Function;Deconditioning;Muscular Weakness  he said that sometimes his equilibrium gets off   Cardiac Risk Stratification High      6 Minute Walk:     6 Minute Walk    Row Name 01/14/17 1437         6 Minute Walk    Phase Initial     Distance 1570 feet     Walk Time 6 minutes     # of Rest Breaks 0     MPH 2.97     METS 5.17     RPE 9     VO2 Peak 18.11     Symptoms No     Resting HR 75 bpm     Resting BP 138/70     Max Ex. HR 112 bpm     Max Ex. BP 146/74     2 Minute Post BP 134/70        Oxygen Initial Assessment:   Oxygen Re-Evaluation:   Oxygen Discharge (Final Oxygen Re-Evaluation):   Initial Exercise Prescription:     Initial Exercise Prescription - 01/14/17 1400      Date of Initial Exercise RX and Referring Provider   Date 01/14/17   Referring Provider End, Harrell Gave MD     Treadmill   MPH 3   Grade 0.5   Minutes 15   METs 3.5     Elliptical   Level 1   Speed 4.5   Minutes 15     T5 Nustep   Level 3   SPM 80   Minutes 15   METs 2     Prescription Details   Frequency (times per week) 2   Duration Progress to 45 minutes of aerobic exercise without signs/symptoms of physical distress     Intensity   THRR 40-80% of Max Heartrate 114-153   Ratings of Perceived Exertion 11-13   Perceived Dyspnea 0-4     Progression   Progression Continue to progress workloads to maintain intensity without signs/symptoms of physical distress.     Resistance Training   Training Prescription Yes   Weight 4 lbs   Reps 10-15      Perform Capillary Blood Glucose checks as needed.  Exercise Prescription Changes:      Exercise Prescription Changes    Row Name 01/14/17 1400             Response to Exercise   Blood Pressure (Admit) 138/70       Blood Pressure (Exercise) 146/74       Blood Pressure (Exit) 134/70       Heart Rate (Admit) 75 bpm       Heart Rate (Exercise) 112 bpm       Heart Rate (Exit) 89 bpm       Oxygen Saturation (Admit) 100 %       Oxygen Saturation (Exercise) 100 %       Rating of Perceived Exertion (Exercise) 9       Symptoms none       Comments walk test results          Exercise Comments:   Exercise Goals and Review:  Exercise Goals    Row Name 01/14/17 1439             Exercise Goals   Increase Physical Activity Yes       Intervention Develop an individualized exercise prescription for aerobic and resistive training based on initial evaluation findings, risk stratification, comorbidities and participant's personal goals.;Provide advice, education, support and counseling about physical activity/exercise needs.       Expected Outcomes Achievement of increased cardiorespiratory fitness and enhanced flexibility, muscular endurance and strength shown through measurements of functional capacity and personal statement of participant.       Increase Strength and Stamina Yes       Intervention Provide advice, education, support and counseling about physical activity/exercise needs.;Develop an individualized exercise prescription for aerobic and resistive training based on initial evaluation findings, risk stratification, comorbidities and participant's personal goals.       Expected Outcomes Achievement of increased cardiorespiratory fitness and enhanced flexibility, muscular endurance and strength shown through measurements of functional capacity and personal statement of participant.          Exercise Goals Re-Evaluation :   Discharge Exercise Prescription (Final Exercise Prescription Changes):     Exercise Prescription Changes - 01/14/17 1400      Response to Exercise   Blood Pressure (Admit) 138/70   Blood Pressure (Exercise) 146/74   Blood Pressure (Exit) 134/70   Heart Rate (Admit) 75 bpm   Heart Rate (Exercise) 112 bpm   Heart Rate (Exit) 89 bpm   Oxygen Saturation (Admit) 100 %   Oxygen Saturation (Exercise) 100 %   Rating of Perceived Exertion (Exercise) 9   Symptoms none   Comments walk test results      Nutrition:  Target Goals: Understanding of nutrition guidelines, daily intake of sodium 1500mg , cholesterol 200mg , calories 30% from fat and 7% or less from saturated fats, daily to  have 5 or more servings of fruits and vegetables.  Biometrics:     Pre Biometrics - 01/14/17 1440      Pre Biometrics   Height 6' 0.8" (1.849 m)   Weight 183 lb 4.8 oz (83.1 kg)   Waist Circumference 30 inches   Hip Circumference 37.5 inches   Waist to Hip Ratio 0.8 %   BMI (Calculated) 24.4   Single Leg Stand 1.98 seconds       Nutrition Therapy Plan and Nutrition Goals:     Nutrition Therapy & Goals - 01/14/17 1302      Nutrition Therapy   RD appointment defered Yes      Nutrition Discharge: Rate Your Plate Scores:     Nutrition Assessments - 01/14/17 1311      MEDFICTS Scores   Pre Score 48      Nutrition Goals Re-Evaluation:   Nutrition Goals Discharge (Final Nutrition Goals Re-Evaluation):   Psychosocial: Target Goals: Acknowledge presence or absence of significant depression and/or stress, maximize coping skills, provide positive support system. Participant is able to verbalize types and ability to use techniques and skills needed for reducing stress and depression.   Initial Review & Psychosocial Screening:     Initial Psych Review & Screening - 01/14/17 1303      Initial Review   Current issues with Current Stress Concerns   Source of Stress Concerns Family   Comments Quetin's Mother has been in the [facility] after being a "cancer survivor" and taking a test to make sure the cancer was gone but she had "kidney problems so the dye  burned alot of her insides".      Family Dynamics   Good Support System? Yes     Barriers   Psychosocial barriers to participate in program The patient should benefit from training in stress management and relaxation.     Screening Interventions   Interventions Encouraged to exercise;To provide support and resources with identified psychosocial needs      Quality of Life Scores:      Quality of Life - 01/14/17 1309      Quality of Life Scores   Health/Function Pre 30 %   Socioeconomic Pre 30 %    Psych/Spiritual Pre 30 %   Family Pre 30 %   GLOBAL Pre 30 %      PHQ-9: Recent Review Flowsheet Data    Depression screen [facility] 2/9 01/14/2017 12/17/2016   Decreased Interest 0 0   Down, Depressed, Hopeless 0 0   PHQ - 2 Score 0 0   Altered sleeping 0 -   Tired, decreased energy 0 -   Change in appetite 0 -   Feeling bad or failure about yourself  0 -   Trouble concentrating 0 -   Moving slowly or fidgety/restless 0 -   Suicidal thoughts 0 -   Difficult doing work/chores Not difficult at all -     Interpretation of Total Score  Total Score Depression Severity:  1-4 = Minimal depression, 5-9 = Mild depression, 10-14 = Moderate depression, 15-19 = Moderately severe depression, 20-27 = Severe depression   Psychosocial Evaluation and Intervention:   Psychosocial Re-Evaluation:   Psychosocial Discharge (Final Psychosocial Re-Evaluation):   Vocational Rehabilitation: Provide vocational rehab assistance to qualifying candidates.   Vocational Rehab Evaluation & Intervention:     Vocational Rehab - 01/14/17 1302      Initial Vocational Rehab Evaluation & Intervention   Assessment shows need for Vocational Rehabilitation No      Education: Education Goals: Education classes will be provided on a weekly basis, covering required topics. Participant will state understanding/return demonstration of topics presented.  Learning Barriers/Preferences:     Learning Barriers/Preferences - 01/14/17 1301      Learning Barriers/Preferences   Learning Barriers None   Learning Preferences Skilled Demonstration      Education Topics: General Nutrition Guidelines/Fats and Fiber: -Group instruction provided by verbal, written material, models and posters to present the general guidelines for heart healthy nutrition. Gives an explanation and review of dietary fats and fiber.   Controlling Sodium/Reading Food Labels: -Group verbal and written material supporting the discussion of  sodium use in heart healthy nutrition. Review and explanation with models, verbal and written materials for utilization of the food label.   Exercise Physiology & Risk Factors: - Group verbal and written instruction with models to review the exercise physiology of the cardiovascular system and associated critical values. Details cardiovascular disease risk factors and the goals associated with each risk factor.   Aerobic Exercise & Resistance Training: - Gives group verbal and written discussion on the health impact of inactivity. On the components of aerobic and resistive training programs and the benefits of this training and how to safely progress through these programs.   Flexibility, Balance, General Exercise Guidelines: - Provides group verbal and written instruction on the benefits of flexibility and balance training programs. Provides general exercise guidelines with specific guidelines to those with heart or lung disease. Demonstration and skill practice provided.   Stress Management: - Provides group verbal and written instruction about the health risks of  elevated stress, cause of high stress, and healthy ways to reduce stress.   Depression: - Provides group verbal and written instruction on the correlation between heart/lung disease and depressed mood, treatment options, and the stigmas associated with seeking treatment.   Anatomy & Physiology of the Heart: - Group verbal and written instruction and models provide basic cardiac anatomy and physiology, with the coronary electrical and arterial systems. Review of: AMI, Angina, Valve disease, Heart Failure, Cardiac Arrhythmia, Pacemakers, and the ICD.   Cardiac Procedures: - Group verbal and written instruction and models to describe the testing methods done to diagnose heart disease. Reviews the outcomes of the test results. Describes the treatment choices: Medical Management, Angioplasty, or Coronary Bypass  Surgery.   Cardiac Medications: - Group verbal and written instruction to review commonly prescribed medications for heart disease. Reviews the medication, class of the drug, and side effects. Includes the steps to properly store meds and maintain the prescription regimen.   Go Sex-Intimacy & Heart Disease, Get SMART - Goal Setting: - Group verbal and written instruction through game format to discuss heart disease and the return to sexual intimacy. Provides group verbal and written material to discuss and apply goal setting through the application of the S.M.A.R.T. Method.   Other Matters of the Heart: - Provides group verbal, written materials and models to describe Heart Failure, Angina, Valve Disease, and Diabetes in the realm of heart disease. Includes description of the disease process and treatment options available to the cardiac patient.   Exercise & Equipment Safety: - Individual verbal instruction and demonstration of equipment use and safety with use of the equipment.   Cardiac Rehab from 01/14/2017 in [facility] Cardiac and Pulmonary Rehab  Date  01/14/17  Educator  C. Lydon Vansickle,RN  Instruction Review Code  1- partially meets, needs review/practice      Infection Prevention: - Provides verbal and written material to individual with discussion of infection control including proper hand washing and proper equipment cleaning during exercise session.   Cardiac Rehab from 01/14/2017 in [facility] Cardiac and Pulmonary Rehab  Date  01/14/17  Educator  C. Twilla Khouri, RN  Instruction Review Code  2- meets goals/outcomes      Falls Prevention: - Provides verbal and written material to individual with discussion of falls prevention and safety.   Cardiac Rehab from 01/14/2017 in [facility] Cardiac and Pulmonary Rehab  Date  01/14/17  Educator  C. ENterkinRN  Instruction Review Code  1- partially meets, needs review/practice      Diabetes: - Individual verbal and written instruction to review  signs/symptoms of diabetes, desired ranges of glucose level fasting, after meals and with exercise. Advice that pre and post exercise glucose checks will be done for 3 sessions at entry of program.   Cardiac Rehab from 01/14/2017 in [facility] Cardiac and Pulmonary Rehab  Date  01/14/17  Educator  C. [place]  Instruction Review Code  1- partially meets, needs review/practice       Knowledge Questionnaire Score:     Knowledge Questionnaire Score - 01/14/17 1310      Knowledge Questionnaire Score   Pre Score 10/28      Core Components/Risk Factors/Patient Goals at Admission:     Personal Goals and Risk Factors at Admission - 01/14/17 1311      Core Components/Risk Factors/Patient Goals on Admission    Weight Management Yes;Weight Maintenance   Intervention Weight Management: Develop a combined nutrition and exercise program designed to reach desired caloric intake, while maintaining appropriate intake  of nutrient and fiber, sodium and fats, and appropriate energy expenditure required for the weight goal.;Weight Management: Provide education and appropriate resources to help participant work on and attain dietary goals.   Admit Weight 183 lb 4.8 oz (83.1 kg)   Expected Outcomes Short Term: Continue to assess and modify interventions until short term weight is achieved;Long Term: Adherence to nutrition and physical activity/exercise program aimed toward attainment of established weight goal;Weight Maintenance: Understanding of the daily nutrition guidelines, which includes 25-35% calories from fat, 7% or less cal from saturated fats, less than 200mg  cholesterol, less than 1.5gm of sodium, & 5 or more servings of fruits and vegetables daily   Diabetes Yes   Intervention Provide education about signs/symptoms and action to take for hypo/hyperglycemia.;Provide education about proper nutrition, including hydration, and aerobic/resistive exercise prescription along with prescribed medications to  achieve blood glucose in normal ranges: Fasting glucose 65-99 mg/dL   Expected Outcomes Short Term: Participant verbalizes understanding of the signs/symptoms and immediate care of hyper/hypoglycemia, proper foot care and importance of medication, aerobic/resistive exercise and nutrition plan for blood glucose control.;Long Term: Attainment of HbA1C < 7%.   Heart Failure Yes   Intervention Provide a combined exercise and nutrition program that is supplemented with education, support and counseling about heart failure. Directed toward relieving symptoms such as shortness of breath, decreased exercise tolerance, and extremity edema.   Expected Outcomes Improve functional capacity of life;Short term: Attendance in program 2-3 days a week with increased exercise capacity. Reported lower sodium intake. Reported increased fruit and vegetable intake. Reports medication compliance.;Short term: Daily weights obtained and reported for increase. Utilizing diuretic protocols set by physician.;Long term: Adoption of self-care skills and reduction of barriers for early signs and symptoms recognition and intervention leading to self-care maintenance.   Stress Yes  His Mother was in the hospital same time he was   Intervention Offer individual and/or small group education and counseling on adjustment to heart disease, stress management and health-related lifestyle change. Teach and support self-help strategies.;Refer participants experiencing significant psychosocial distress to appropriate mental health specialists for further evaluation and treatment. When possible, include family members and significant others in education/counseling sessions.   Expected Outcomes Short Term: Participant demonstrates changes in health-related behavior, relaxation and other stress management skills, ability to obtain effective social support, and compliance with psychotropic medications if prescribed.;Long Term: Emotional wellbeing is  indicated by absence of clinically significant psychosocial distress or social isolation.      Core Components/Risk Factors/Patient Goals Review:    Core Components/Risk Factors/Patient Goals at Discharge (Final Review):    ITP Comments:     ITP Comments    Row Name 01/14/17 1123           ITP Comments ITP Created during Medical review Documentation DX of 12/19/2016 [place] Care Note          Comments: Ready to start Cardiac REhab. Works nights.

## 2017-01-14 NOTE — Patient Instructions (Signed)
Continue weighing daily and call for an overnight weight gain of > 2 pounds or a weekly weight gain of >5 pounds. 

## 2017-01-14 NOTE — Progress Notes (Signed)
Patient ID: Philip Ponce, male    DOB: June 30, 1971, 46 y.o.   MRN: [id number]  HPI  Philip Ponce is a 54 yoM with PMH significant for DM, iron deficiency, HTN, gastric bypass, STEMI and CHF with reduced ejection fraction.  Last echo was done 12/05/16 and showed an EF of 20-25% with mild Philip. Echo on 04/14/15 showed EF of 50-55%.  Last admission was 12/04/16 for chest pain. Workup for STEMI and cath showed no significant CAD. Pt also was experiencing symptoms of CHF and was treated with IV furosemide.   Presents today for his chief complaint of a follow-up visit for his HF. He denies any fatigue, shortness of breath, edema or dizziness. Did have an issue with entresto to start with but that's because he stopped taking his carvedilol and continued the lisinopril with the entresto. Once he resumed carvedilol and stopped lisinopril, he has tolerated the entresto without difficulty. Overall feels great.   Past Medical History:  Diagnosis Date  . Acute blood loss anemia 11/20/2016  . CHF (congestive heart failure) ([place])   . Coronary artery disease   . Diabetes mellitus type I ([place])   . Slipped intervertebral disc    L4 L5  . STEMI (ST elevation myocardial infarction) [facility])    Past Surgical History:  Procedure Laterality Date  . COLONOSCOPY WITH PROPOFOL N/A 12/03/2016   Procedure: COLONOSCOPY WITH PROPOFOL;  Surgeon: [name], MD;  Location: [facility] ENDOSCOPY;  Service: Endoscopy;  Laterality: N/A;  . ESOPHAGOGASTRODUODENOSCOPY (EGD) WITH PROPOFOL N/A 12/03/2016   Procedure: ESOPHAGOGASTRODUODENOSCOPY (EGD) WITH PROPOFOL;  Surgeon: [name], MD;  Location: [facility] ENDOSCOPY;  Service: Endoscopy;  Laterality: N/A;  . GASTRIC BYPASS    . GIVENS CAPSULE STUDY N/A 01/07/2017   Procedure: GIVENS CAPSULE STUDY;  Surgeon: [name], MD;  Location: [facility] ENDOSCOPY;  Service: Endoscopy;  Laterality: N/A;  . LEFT HEART CATH AND CORONARY ANGIOGRAPHY N/A 12/04/2016   Procedure: Left Heart Cath and Coronary  Angiography;  Surgeon: [name], MD;  Location: [facility] LAB;  Service: Cardiovascular;  Laterality: N/A;   Family History  Problem Relation Age of Onset  . Diabetes Mother   . Hypertension Mother   . Cancer Mother     breast  . Diabetes Father   . Hypertension Father   . Cancer Father     colon  . Cancer Maternal Grandfather     prostate  . Stroke Maternal Grandfather     CVA   Social History  Substance Use Topics  . Smoking status: Never Smoker  . Smokeless tobacco: Never Used  . Alcohol use No   Allergies  Allergen Reactions  . Mobic [Meloxicam] Other (See Comments)    Ulcers in stomach eruption   . Diclofenac Other (See Comments)    Fast heart beat,, CP,SOB and weakness on one side.   Prior to Admission medications   Medication Sig Start Date End Date Taking? Authorizing Provider  calcium carbonate (OS-CAL) 600 MG TABS tablet Take 600 mg by mouth 2 (two) times daily with a meal.   Yes Historical Provider, MD  carvedilol (COREG) 3.125 MG tablet Take 1 tablet (3.125 mg total) by mouth 2 (two) times daily. 12/19/16 03/19/17 Yes Christopher End, MD  furosemide (LASIX) 20 MG tablet Take 1 tablet (20 mg total) by mouth daily as needed. Take for > 2lb weight gain in 1 day or > 5lb weight gain in 1 week. 12/19/16 03/19/17 Yes Christopher End, MD  glucose blood (ONE TOUCH  ULTRA TEST) test strip Check blood sugar once daily and as instructed. Dx E11.9 01/19/16  Yes Amy Cletis [place], MD  Insulin Glargine (TOUJEO SOLOSTAR) 300 UNIT/ML SOPN Inject 10 Units into the skin daily. Patient taking differently: Inject 15 Units into the skin daily.  12/27/16  Yes Amy Cletis [place], MD  metFORMIN (GLUCOPHAGE-XR) 500 MG 24 hr tablet Take 4 tablets (2,000 mg total) by mouth daily with breakfast. Patient taking differently: Take 1,000 mg by mouth 2 (two) times daily.  12/11/16  Yes Amy Cletis [place], MD  Multiple Vitamins-Minerals (MENS MULTI VITAMIN & MINERAL PO) Take 2 tablets by mouth daily. Men's  Vita Fusion   Yes Historical Provider, MD  Omega-3 Fatty Acids (FISH OIL) 1200 MG CAPS Take 1 capsule by mouth daily.   Yes Historical Provider, MD  omeprazole (PRILOSEC) 40 MG capsule Take 1 capsule (40 mg total) by mouth daily. 11/23/16 01/14/17 Yes Tonia Ghent, MD  sacubitril-valsartan (ENTRESTO) 24-26 MG Take 1 tablet by mouth 2 (two) times daily. 01/14/17  Yes Alisa Graff, FNP    Review of Systems  Constitutional: Negative for appetite change and fatigue.  HENT: Negative for congestion, postnasal drip and sore throat.   Eyes: Negative.   Respiratory: Negative for cough, chest tightness and shortness of breath.   Cardiovascular: Negative for chest pain, palpitations and leg swelling.  Gastrointestinal: Negative for abdominal distention and abdominal pain.  Endocrine: Negative.   Genitourinary: Negative.   Musculoskeletal: Negative for back pain and neck pain.  Skin: Negative.   Allergic/Immunologic: Negative.   Neurological: Negative for dizziness and light-headedness.  Hematological: Negative for adenopathy. Does not bruise/bleed easily.  Psychiatric/Behavioral: Negative for dysphoric mood and sleep disturbance (sleeping on 2 pillows). The patient is not nervous/anxious.    Vitals:   01/14/17 0825  BP: 128/67  Pulse: 80  Resp: 20  SpO2: 100%  Weight: 186 lb (84.4 kg)  Height: 6\' 1"  (1.854 m)   Wt Readings from Last 3 Encounters:  01/14/17 186 lb (84.4 kg)  01/02/17 186 lb 12.8 oz (84.7 kg)  12/27/16 179 lb 8 oz (81.4 kg)   Lab Results  Component Value Date   CREATININE 0.87 12/06/2016   CREATININE 0.71 12/05/2016   CREATININE 0.80 12/04/2016    Physical Exam  Constitutional: He is oriented to person, place, and time. He appears well-developed and well-nourished.  HENT:  Head: Normocephalic and atraumatic.  Eyes: Conjunctivae are normal. Pupils are equal, round, and reactive to light.  Neck: Normal range of motion. Neck supple. No JVD present.  Cardiovascular:  Normal rate and regular rhythm.   Pulmonary/Chest: Effort normal. He has no wheezes. He has no rales.  Abdominal: Soft. He exhibits no distension. There is no tenderness.  Musculoskeletal: He exhibits no edema or tenderness.  Neurological: He is alert and oriented to person, place, and time.  Skin: Skin is warm and dry.  Psychiatric: He has a normal mood and affect. His behavior is normal. Thought content normal.  Nursing note and vitals reviewed.     Assessment & Plan:  1. Congestive heart failure with reduced ejection fraction - NYHA class I - euvolemic - Weighing daily and logging. Call for weight gain of > 2 lbs overnight or > 5 lbs in one week - not adding salt. Encouraged following < 2000 mg/day sodium intake. Reading food labels for sodium and carbs - has his initial cardiac rehab appointment later today - tolerating Entresto 24/26 mg bid; discussed increasing that or carvedilol at  his next office visit.  - BMP drawn today - Saw cardiologist (End) 12/19/16; returns to him May 2018  2. Diabetes - A1c on 11/27/16 was 9.6% - Checking about twice a day - No lows, discussed s/sx of lows - glucose levels much improved since beginning toujeo daily. Glucose this morning was 93 - Saw PCP on 12/27/16 ([facility])  3. HTN - BP looked good today 128/67  Will see patient back in 1 month or sooner if needed. Encouraged patient to bring medication bottles to each visit.

## 2017-01-16 ENCOUNTER — Encounter: Payer: Self-pay | Admitting: *Deleted

## 2017-01-16 DIAGNOSIS — I5022 Chronic systolic (congestive) heart failure: Secondary | ICD-10-CM

## 2017-01-16 DIAGNOSIS — I213 ST elevation (STEMI) myocardial infarction of unspecified site: Secondary | ICD-10-CM

## 2017-01-16 NOTE — Progress Notes (Signed)
Cardiac Individual Treatment Plan  Patient Details  Name: Philip Ponce MRN: [id number] Date of Birth: October 03, 1970 Referring Provider:     Cardiac Rehab from 01/14/2017 in [facility] Cardiac and Pulmonary Rehab  Referring Provider  End, Harrell Gave MD      Initial Encounter Date:    Cardiac Rehab from 01/14/2017 in [facility] Cardiac and Pulmonary Rehab  Date  01/14/17  Referring Provider  End, Harrell Gave MD      Visit Diagnosis: Heart failure, chronic systolic ([place])  ST elevation myocardial infarction (STEMI), unspecified artery ([place])  Patient's Home Medications on Admission:  Current Outpatient Prescriptions:  .  calcium carbonate (OS-CAL) 600 MG TABS tablet, Take 600 mg by mouth 2 (two) times daily with a meal., Disp: , Rfl:  .  carvedilol (COREG) 3.125 MG tablet, Take 1 tablet (3.125 mg total) by mouth 2 (two) times daily., Disp: 180 tablet, Rfl: 3 .  furosemide (LASIX) 20 MG tablet, Take 1 tablet (20 mg total) by mouth daily as needed. Take for > 2lb weight gain in 1 day or > 5lb weight gain in 1 week., Disp: 90 tablet, Rfl: 3 .  glucose blood (ONE TOUCH ULTRA TEST) test strip, Check blood sugar once daily and as instructed. Dx E11.9, Disp: 100 each, Rfl: 1 .  Insulin Glargine (TOUJEO SOLOSTAR) 300 UNIT/ML SOPN, Inject 10 Units into the skin daily. (Patient taking differently: Inject 15 Units into the skin daily. ), Disp: 1.5 mL, Rfl: 11 .  metFORMIN (GLUCOPHAGE-XR) 500 MG 24 hr tablet, Take 4 tablets (2,000 mg total) by mouth daily with breakfast. (Patient taking differently: Take 1,000 mg by mouth 2 (two) times daily. ), Disp: 120 tablet, Rfl: 11 .  Multiple Vitamins-Minerals (MENS MULTI VITAMIN & MINERAL PO), Take 2 tablets by mouth daily. Men's Vita Fusion, Disp: , Rfl:  .  Omega-3 Fatty Acids (FISH OIL) 1200 MG CAPS, Take 1 capsule by mouth daily., Disp: , Rfl:  .  omeprazole (PRILOSEC) 40 MG capsule, Take 1 capsule (40 mg total) by mouth daily., Disp: 30 capsule, Rfl: 0 .   sacubitril-valsartan (ENTRESTO) 24-26 MG, Take 1 tablet by mouth 2 (two) times daily., Disp: 60 tablet, Rfl: 5  Past Medical History: Past Medical History:  Diagnosis Date  . Acute blood loss anemia 11/20/2016  . CHF (congestive heart failure) ([place])   . Coronary artery disease   . Diabetes mellitus type I ([place])   . Slipped intervertebral disc    L4 L5  . STEMI (ST elevation myocardial infarction) ([place])     Tobacco Use: History  Smoking Status  . Never Smoker  Smokeless Tobacco  . Never Used    Labs: Recent Review Flowsheet Data    Labs for ITP Cardiac and Pulmonary Rehab Latest Ref Rng & Units 01/01/2013 04/15/2015 10/07/2015 01/03/2016 11/27/2016   Cholestrol 0 - 200 mg/dL - 101 121 - -   LDLCALC 0 - 99 mg/dL - 38 39 - -   HDL >39.00 mg/dL - 51.40 66.70 - -   Trlycerides 0.0 - 149.0 mg/dL - 58.0 78.0 - -   Hemoglobin A1c 4.6 - 6.5 % 8.6(H) 7.0(H) 10.5(H) 10.6(H) 9.6(H)       Exercise Target Goals:    Exercise Program Goal: Individual exercise prescription set with THRR, safety & activity barriers. Participant demonstrates ability to understand and report RPE using BORG scale, to self-measure pulse accurately, and to acknowledge the importance of the exercise prescription.  Exercise Prescription Goal: Starting with aerobic activity 30 plus minutes a  day, 3 days per week for initial exercise prescription. Provide home exercise prescription and guidelines that participant acknowledges understanding prior to discharge.  Activity Barriers & Risk Stratification:     Activity Barriers & Cardiac Risk Stratification - 01/14/17 1311      Activity Barriers & Cardiac Risk Stratification   Activity Barriers Balance Concerns;Decreased Ventricular Function;Deconditioning;Muscular Weakness  he said that sometimes his equilibrium gets off   Cardiac Risk Stratification High      6 Minute Walk:     6 Minute Walk    Row Name 01/14/17 1437         6 Minute Walk   Phase Initial      Distance 1570 feet     Walk Time 6 minutes     # of Rest Breaks 0     MPH 2.97     METS 5.17     RPE 9     VO2 Peak 18.11     Symptoms No     Resting HR 75 bpm     Resting BP 138/70     Max Ex. HR 112 bpm     Max Ex. BP 146/74     2 Minute Post BP 134/70        Oxygen Initial Assessment:   Oxygen Re-Evaluation:   Oxygen Discharge (Final Oxygen Re-Evaluation):   Initial Exercise Prescription:     Initial Exercise Prescription - 01/14/17 1400      Date of Initial Exercise RX and Referring Provider   Date 01/14/17   Referring Provider End, Harrell Gave MD     Treadmill   MPH 3   Grade 0.5   Minutes 15   METs 3.5     Elliptical   Level 1   Speed 4.5   Minutes 15     T5 Nustep   Level 3   SPM 80   Minutes 15   METs 2     Prescription Details   Frequency (times per week) 2   Duration Progress to 45 minutes of aerobic exercise without signs/symptoms of physical distress     Intensity   THRR 40-80% of Max Heartrate 114-153   Ratings of Perceived Exertion 11-13   Perceived Dyspnea 0-4     Progression   Progression Continue to progress workloads to maintain intensity without signs/symptoms of physical distress.     Resistance Training   Training Prescription Yes   Weight 4 lbs   Reps 10-15      Perform Capillary Blood Glucose checks as needed.  Exercise Prescription Changes:     Exercise Prescription Changes    Row Name 01/14/17 1400             Response to Exercise   Blood Pressure (Admit) 138/70       Blood Pressure (Exercise) 146/74       Blood Pressure (Exit) 134/70       Heart Rate (Admit) 75 bpm       Heart Rate (Exercise) 112 bpm       Heart Rate (Exit) 89 bpm       Oxygen Saturation (Admit) 100 %       Oxygen Saturation (Exercise) 100 %       Rating of Perceived Exertion (Exercise) 9       Symptoms none       Comments walk test results          Exercise Comments:   Exercise Goals and Review:  Exercise Goals     Row Name 01/14/17 1439             Exercise Goals   Increase Physical Activity Yes       Intervention Develop an individualized exercise prescription for aerobic and resistive training based on initial evaluation findings, risk stratification, comorbidities and participant's personal goals.;Provide advice, education, support and counseling about physical activity/exercise needs.       Expected Outcomes Achievement of increased cardiorespiratory fitness and enhanced flexibility, muscular endurance and strength shown through measurements of functional capacity and personal statement of participant.       Increase Strength and Stamina Yes       Intervention Provide advice, education, support and counseling about physical activity/exercise needs.;Develop an individualized exercise prescription for aerobic and resistive training based on initial evaluation findings, risk stratification, comorbidities and participant's personal goals.       Expected Outcomes Achievement of increased cardiorespiratory fitness and enhanced flexibility, muscular endurance and strength shown through measurements of functional capacity and personal statement of participant.          Exercise Goals Re-Evaluation :   Discharge Exercise Prescription (Final Exercise Prescription Changes):     Exercise Prescription Changes - 01/14/17 1400      Response to Exercise   Blood Pressure (Admit) 138/70   Blood Pressure (Exercise) 146/74   Blood Pressure (Exit) 134/70   Heart Rate (Admit) 75 bpm   Heart Rate (Exercise) 112 bpm   Heart Rate (Exit) 89 bpm   Oxygen Saturation (Admit) 100 %   Oxygen Saturation (Exercise) 100 %   Rating of Perceived Exertion (Exercise) 9   Symptoms none   Comments walk test results      Nutrition:  Target Goals: Understanding of nutrition guidelines, daily intake of sodium 1500mg , cholesterol 200mg , calories 30% from fat and 7% or less from saturated fats, daily to have 5 or more  servings of fruits and vegetables.  Biometrics:     Pre Biometrics - 01/14/17 1440      Pre Biometrics   Height 6' 0.8" (1.849 m)   Weight 183 lb 4.8 oz (83.1 kg)   Waist Circumference 30 inches   Hip Circumference 37.5 inches   Waist to Hip Ratio 0.8 %   BMI (Calculated) 24.4   Single Leg Stand 1.98 seconds       Nutrition Therapy Plan and Nutrition Goals:     Nutrition Therapy & Goals - 01/14/17 1302      Nutrition Therapy   RD appointment defered Yes      Nutrition Discharge: Rate Your Plate Scores:     Nutrition Assessments - 01/14/17 1311      MEDFICTS Scores   Pre Score 48      Nutrition Goals Re-Evaluation:   Nutrition Goals Discharge (Final Nutrition Goals Re-Evaluation):   Psychosocial: Target Goals: Acknowledge presence or absence of significant depression and/or stress, maximize coping skills, provide positive support system. Participant is able to verbalize types and ability to use techniques and skills needed for reducing stress and depression.   Initial Review & Psychosocial Screening:     Initial Psych Review & Screening - 01/14/17 1303      Initial Review   Current issues with Current Stress Concerns   Source of Stress Concerns Family   Comments Quetin's Mother has been in the [facility] after being a "cancer survivor" and taking a test to make sure the cancer was gone but she had "kidney problems so the  dye burned alot of her insides".      Family Dynamics   Good Support System? Yes     Barriers   Psychosocial barriers to participate in program The patient should benefit from training in stress management and relaxation.     Screening Interventions   Interventions Encouraged to exercise;To provide support and resources with identified psychosocial needs      Quality of Life Scores:      Quality of Life - 01/14/17 1309      Quality of Life Scores   Health/Function Pre 30 %   Socioeconomic Pre 30 %   Psych/Spiritual Pre  30 %   Family Pre 30 %   GLOBAL Pre 30 %      PHQ-9: Recent Review Flowsheet Data    Depression screen [facility] 2/9 01/14/2017 12/17/2016   Decreased Interest 0 0   Down, Depressed, Hopeless 0 0   PHQ - 2 Score 0 0   Altered sleeping 0 -   Tired, decreased energy 0 -   Change in appetite 0 -   Feeling bad or failure about yourself  0 -   Trouble concentrating 0 -   Moving slowly or fidgety/restless 0 -   Suicidal thoughts 0 -   Difficult doing work/chores Not difficult at all -     Interpretation of Total Score  Total Score Depression Severity:  1-4 = Minimal depression, 5-9 = Mild depression, 10-14 = Moderate depression, 15-19 = Moderately severe depression, 20-27 = Severe depression   Psychosocial Evaluation and Intervention:   Psychosocial Re-Evaluation:   Psychosocial Discharge (Final Psychosocial Re-Evaluation):   Vocational Rehabilitation: Provide vocational rehab assistance to qualifying candidates.   Vocational Rehab Evaluation & Intervention:     Vocational Rehab - 01/14/17 1302      Initial Vocational Rehab Evaluation & Intervention   Assessment shows need for Vocational Rehabilitation No      Education: Education Goals: Education classes will be provided on a weekly basis, covering required topics. Participant will state understanding/return demonstration of topics presented.  Learning Barriers/Preferences:     Learning Barriers/Preferences - 01/14/17 1301      Learning Barriers/Preferences   Learning Barriers None   Learning Preferences Skilled Demonstration      Education Topics: General Nutrition Guidelines/Fats and Fiber: -Group instruction provided by verbal, written material, models and posters to present the general guidelines for heart healthy nutrition. Gives an explanation and review of dietary fats and fiber.   Controlling Sodium/Reading Food Labels: -Group verbal and written material supporting the discussion of sodium use in heart  healthy nutrition. Review and explanation with models, verbal and written materials for utilization of the food label.   Exercise Physiology & Risk Factors: - Group verbal and written instruction with models to review the exercise physiology of the cardiovascular system and associated critical values. Details cardiovascular disease risk factors and the goals associated with each risk factor.   Aerobic Exercise & Resistance Training: - Gives group verbal and written discussion on the health impact of inactivity. On the components of aerobic and resistive training programs and the benefits of this training and how to safely progress through these programs.   Flexibility, Balance, General Exercise Guidelines: - Provides group verbal and written instruction on the benefits of flexibility and balance training programs. Provides general exercise guidelines with specific guidelines to those with heart or lung disease. Demonstration and skill practice provided.   Stress Management: - Provides group verbal and written instruction about the health risks  of elevated stress, cause of high stress, and healthy ways to reduce stress.   Depression: - Provides group verbal and written instruction on the correlation between heart/lung disease and depressed mood, treatment options, and the stigmas associated with seeking treatment.   Anatomy & Physiology of the Heart: - Group verbal and written instruction and models provide basic cardiac anatomy and physiology, with the coronary electrical and arterial systems. Review of: AMI, Angina, Valve disease, Heart Failure, Cardiac Arrhythmia, Pacemakers, and the ICD.   Cardiac Procedures: - Group verbal and written instruction and models to describe the testing methods done to diagnose heart disease. Reviews the outcomes of the test results. Describes the treatment choices: Medical Management, Angioplasty, or Coronary Bypass Surgery.   Cardiac Medications: -  Group verbal and written instruction to review commonly prescribed medications for heart disease. Reviews the medication, class of the drug, and side effects. Includes the steps to properly store meds and maintain the prescription regimen.   Go Sex-Intimacy & Heart Disease, Get SMART - Goal Setting: - Group verbal and written instruction through game format to discuss heart disease and the return to sexual intimacy. Provides group verbal and written material to discuss and apply goal setting through the application of the S.M.A.R.T. Method.   Other Matters of the Heart: - Provides group verbal, written materials and models to describe Heart Failure, Angina, Valve Disease, and Diabetes in the realm of heart disease. Includes description of the disease process and treatment options available to the cardiac patient.   Exercise & Equipment Safety: - Individual verbal instruction and demonstration of equipment use and safety with use of the equipment.   Cardiac Rehab from 01/14/2017 in [facility] Cardiac and Pulmonary Rehab  Date  01/14/17  Educator  C. Enterkin,RN  Instruction Review Code  1- partially meets, needs review/practice      Infection Prevention: - Provides verbal and written material to individual with discussion of infection control including proper hand washing and proper equipment cleaning during exercise session.   Cardiac Rehab from 01/14/2017 in [facility] Cardiac and Pulmonary Rehab  Date  01/14/17  Educator  C. Enterkin, RN  Instruction Review Code  2- meets goals/outcomes      Falls Prevention: - Provides verbal and written material to individual with discussion of falls prevention and safety.   Cardiac Rehab from 01/14/2017 in [facility] Cardiac and Pulmonary Rehab  Date  01/14/17  Educator  C. ENterkinRN  Instruction Review Code  1- partially meets, needs review/practice      Diabetes: - Individual verbal and written instruction to review signs/symptoms of diabetes, desired  ranges of glucose level fasting, after meals and with exercise. Advice that pre and post exercise glucose checks will be done for 3 sessions at entry of program.   Cardiac Rehab from 01/14/2017 in [facility] Cardiac and Pulmonary Rehab  Date  01/14/17  Educator  C. Fenton  Instruction Review Code  1- partially meets, needs review/practice       Knowledge Questionnaire Score:     Knowledge Questionnaire Score - 01/14/17 1310      Knowledge Questionnaire Score   Pre Score 10/28      Core Components/Risk Factors/Patient Goals at Admission:     Personal Goals and Risk Factors at Admission - 01/14/17 1311      Core Components/Risk Factors/Patient Goals on Admission    Weight Management Yes;Weight Maintenance   Intervention Weight Management: Develop a combined nutrition and exercise program designed to reach desired caloric intake, while maintaining appropriate  intake of nutrient and fiber, sodium and fats, and appropriate energy expenditure required for the weight goal.;Weight Management: Provide education and appropriate resources to help participant work on and attain dietary goals.   Admit Weight 183 lb 4.8 oz (83.1 kg)   Expected Outcomes Short Term: Continue to assess and modify interventions until short term weight is achieved;Long Term: Adherence to nutrition and physical activity/exercise program aimed toward attainment of established weight goal;Weight Maintenance: Understanding of the daily nutrition guidelines, which includes 25-35% calories from fat, 7% or less cal from saturated fats, less than 200mg  cholesterol, less than 1.5gm of sodium, & 5 or more servings of fruits and vegetables daily   Diabetes Yes   Intervention Provide education about signs/symptoms and action to take for hypo/hyperglycemia.;Provide education about proper nutrition, including hydration, and aerobic/resistive exercise prescription along with prescribed medications to achieve blood glucose in normal ranges:  Fasting glucose 65-99 mg/dL   Expected Outcomes Short Term: Participant verbalizes understanding of the signs/symptoms and immediate care of hyper/hypoglycemia, proper foot care and importance of medication, aerobic/resistive exercise and nutrition plan for blood glucose control.;Long Term: Attainment of HbA1C < 7%.   Heart Failure Yes   Intervention Provide a combined exercise and nutrition program that is supplemented with education, support and counseling about heart failure. Directed toward relieving symptoms such as shortness of breath, decreased exercise tolerance, and extremity edema.   Expected Outcomes Improve functional capacity of life;Short term: Attendance in program 2-3 days a week with increased exercise capacity. Reported lower sodium intake. Reported increased fruit and vegetable intake. Reports medication compliance.;Short term: Daily weights obtained and reported for increase. Utilizing diuretic protocols set by physician.;Long term: Adoption of self-care skills and reduction of barriers for early signs and symptoms recognition and intervention leading to self-care maintenance.   Stress Yes  His Mother was in the hospital same time he was   Intervention Offer individual and/or small group education and counseling on adjustment to heart disease, stress management and health-related lifestyle change. Teach and support self-help strategies.;Refer participants experiencing significant psychosocial distress to appropriate mental health specialists for further evaluation and treatment. When possible, include family members and significant others in education/counseling sessions.   Expected Outcomes Short Term: Participant demonstrates changes in health-related behavior, relaxation and other stress management skills, ability to obtain effective social support, and compliance with psychotropic medications if prescribed.;Long Term: Emotional wellbeing is indicated by absence of clinically  significant psychosocial distress or social isolation.      Core Components/Risk Factors/Patient Goals Review:    Core Components/Risk Factors/Patient Goals at Discharge (Final Review):    ITP Comments:     ITP Comments    Row Name 01/14/17 1123 01/16/17 0619         ITP Comments ITP Created during Medical review Documentation DX of 12/19/2016 [place] Care Note 30 day review. Continue with ITP unless directed changes per Medical Director review   New to program has attended medical review         Comments:

## 2017-01-24 ENCOUNTER — Encounter: Payer: 59 | Attending: Internal Medicine | Admitting: *Deleted

## 2017-01-24 DIAGNOSIS — I213 ST elevation (STEMI) myocardial infarction of unspecified site: Secondary | ICD-10-CM

## 2017-01-24 DIAGNOSIS — I10 Essential (primary) hypertension: Secondary | ICD-10-CM | POA: Insufficient documentation

## 2017-01-24 DIAGNOSIS — I5022 Chronic systolic (congestive) heart failure: Secondary | ICD-10-CM

## 2017-01-24 LAB — GLUCOSE, CAPILLARY
GLUCOSE-CAPILLARY: 200 mg/dL — AB (ref 65–99)
Glucose-Capillary: 111 mg/dL — ABNORMAL HIGH (ref 65–99)

## 2017-01-24 NOTE — Progress Notes (Signed)
Daily Session Note  Patient Details  Name: Philip Ponce MRN: [id number] Date of Birth: [birth year] Referring Provider:     Cardiac Rehab from 01/14/2017 in [facility] Cardiac and Pulmonary Rehab  Referring Provider  End, Harrell Gave MD      Encounter Date: 01/24/2017  Check In:     Session Check In - 01/24/17 1048      Check-In   Location [facility]-Cardiac & Pulmonary Rehab   Staff Present Alberteen Sam, MA, ACSM RCEP, Exercise Physiologist;Amanda Oletta Darter, BA, ACSM CEP, Exercise Physiologist;Other  [name], RN, BSN   Supervising physician immediately available to respond to emergencies See telemetry face sheet for immediately available ER MD   Medication changes reported     No   Fall or balance concerns reported    No   Warm-up and Cool-down Performed on first and last piece of equipment   Resistance Training Performed Yes   VAD Patient? No     Pain Assessment   Currently in Pain? No/denies   Multiple Pain Sites No         History  Smoking Status  . Never Smoker  Smokeless Tobacco  . Never Used    Goals Met:  Exercise tolerated well Personal goals reviewed No report of cardiac concerns or symptoms Strength training completed today  Goals Unmet:  Not Applicable  Comments: First full day of exercise!  Patient was oriented to gym and equipment including functions, settings, policies, and procedures.  Patient's individual exercise prescription and treatment plan were reviewed.  All starting workloads were established based on the results of the 6 minute walk test done at initial orientation visit.  The plan for exercise progression was also introduced and progression will be customized based on patient's performance and goals.    Dr. Emily Filbert is Medical Director for [facility] and [facility].

## 2017-01-29 ENCOUNTER — Encounter: Payer: Self-pay | Admitting: Oncology

## 2017-01-29 ENCOUNTER — Inpatient Hospital Stay: Payer: 59

## 2017-01-29 ENCOUNTER — Encounter: Payer: 59 | Admitting: Respiratory Therapy

## 2017-01-29 ENCOUNTER — Inpatient Hospital Stay: Payer: 59 | Attending: Oncology | Admitting: Oncology

## 2017-01-29 VITALS — BP 131/88 | HR 81 | Temp 97.5°F | Wt 185.5 lb

## 2017-01-29 DIAGNOSIS — I5022 Chronic systolic (congestive) heart failure: Secondary | ICD-10-CM | POA: Diagnosis not present

## 2017-01-29 DIAGNOSIS — I429 Cardiomyopathy, unspecified: Secondary | ICD-10-CM | POA: Diagnosis not present

## 2017-01-29 DIAGNOSIS — Z9884 Bariatric surgery status: Secondary | ICD-10-CM | POA: Insufficient documentation

## 2017-01-29 DIAGNOSIS — Z79899 Other long term (current) drug therapy: Secondary | ICD-10-CM | POA: Diagnosis not present

## 2017-01-29 DIAGNOSIS — I213 ST elevation (STEMI) myocardial infarction of unspecified site: Secondary | ICD-10-CM

## 2017-01-29 DIAGNOSIS — D508 Other iron deficiency anemias: Secondary | ICD-10-CM | POA: Insufficient documentation

## 2017-01-29 DIAGNOSIS — I2510 Atherosclerotic heart disease of native coronary artery without angina pectoris: Secondary | ICD-10-CM | POA: Insufficient documentation

## 2017-01-29 DIAGNOSIS — I252 Old myocardial infarction: Secondary | ICD-10-CM | POA: Insufficient documentation

## 2017-01-29 DIAGNOSIS — Z794 Long term (current) use of insulin: Secondary | ICD-10-CM | POA: Diagnosis not present

## 2017-01-29 DIAGNOSIS — E119 Type 2 diabetes mellitus without complications: Secondary | ICD-10-CM | POA: Diagnosis not present

## 2017-01-29 DIAGNOSIS — I509 Heart failure, unspecified: Secondary | ICD-10-CM | POA: Insufficient documentation

## 2017-01-29 DIAGNOSIS — Z800 Family history of malignant neoplasm of digestive organs: Secondary | ICD-10-CM | POA: Diagnosis not present

## 2017-01-29 DIAGNOSIS — D509 Iron deficiency anemia, unspecified: Secondary | ICD-10-CM

## 2017-01-29 LAB — CBC
HEMATOCRIT: 36 % — AB (ref 40.0–52.0)
Hemoglobin: 11.9 g/dL — ABNORMAL LOW (ref 13.0–18.0)
MCH: 28.6 pg (ref 26.0–34.0)
MCHC: 33.1 g/dL (ref 32.0–36.0)
MCV: 86.3 fL (ref 80.0–100.0)
PLATELETS: 233 10*3/uL (ref 150–440)
RBC: 4.17 MIL/uL — ABNORMAL LOW (ref 4.40–5.90)
RDW: 14.2 % (ref 11.5–14.5)
WBC: 4.2 10*3/uL (ref 3.8–10.6)

## 2017-01-29 LAB — IRON AND TIBC
Iron: 83 ug/dL (ref 45–182)
Saturation Ratios: 35 % (ref 17.9–39.5)
TIBC: 237 ug/dL — AB (ref 250–450)
UIBC: 154 ug/dL

## 2017-01-29 LAB — FERRITIN: Ferritin: 144 ng/mL (ref 24–336)

## 2017-01-29 LAB — GLUCOSE, CAPILLARY: GLUCOSE-CAPILLARY: 102 mg/dL — AB (ref 65–99)

## 2017-01-29 NOTE — Progress Notes (Signed)
Daily Session Note  Patient Details  Name: Philip Ponce MRN: [id number] Date of Birth: [birth year] Referring Provider:     Cardiac Rehab from 01/14/2017 in [facility] Cardiac and Pulmonary Rehab  Referring Provider  End, Harrell Gave MD      Encounter Date: 01/29/2017  Check In:     Session Check In - 01/29/17 7183      Check-In   Location [facility]-Cardiac & Pulmonary Rehab   Staff Present Alberteen Sam, MA, ACSM RCEP, Exercise Physiologist;Laureen Owens Shark, BS, RRT, Respiratory Therapist;Carroll Enterkin, RN, BSN   Supervising physician immediately available to respond to emergencies See telemetry face sheet for immediately available ER MD   Medication changes reported     No   Fall or balance concerns reported    No   Warm-up and Cool-down Performed on first and last piece of equipment   Resistance Training Performed Yes   VAD Patient? No     Pain Assessment   Currently in Pain? No/denies   Multiple Pain Sites No         History  Smoking Status   Never Smoker  Smokeless Tobacco   Never Used    Goals Met:  Proper associated with RPD/PD & O2 Sat Independence with exercise equipment Exercise tolerated well No report of cardiac concerns or symptoms Strength training completed today  Goals Unmet:  Not Applicable  Comments: Pt able to follow exercise prescription today without complaint.  Will continue to monitor for progression.   Dr. Emily Filbert is Medical Director for [facility] and [facility].

## 2017-01-29 NOTE — Progress Notes (Signed)
Hematology/Oncology Consult note [facility]  [phone] [phone]  Patient Care Team: [name], MD as PCP - General Jackelyn Hoehn Aura Fey, FNP as Nurse Practitioner (Family Medicine) End, Harrell Gave, MD as Consulting Physician (Cardiology)   Name of the patient: Philip Ponce  [id number]  07-29-1971   Date of visit: 01/29/17  Diagnosis- iron deficiency anemia likely due to gastric bypass  Chief complaint/ Reason for visit- routine f/u  Heme/Onc history: patient is a 46 year old male who was seen by [name] for symptoms of bright red blood per rectum. Patient also had some epigastric pain in the past and was on NSAIDs at that time which was stopped. Patient's father died of colon cancer. Patient has a history of gastric bypass about 3 years ago. Last CBC from 12/05/2016 showed white count of 4.9, H&H of 8.1/24.1 and a platelet count of 349. Ferritin was low at 8. Serum iron was low at 15 and iron saturation was low at 5%. TIBC was normal at 295. H pylori stool antigen was negative. B12 folate and TSH were within normal limits. EGD on 12/03/2016 showed: Esophagus was normal. Evidence of gastric bypass. Otherwise normal findings of gastric bypass. Biopsies were taken for evaluation of celiac disease. Colonoscopy was normal. Patient also has a history of cardiomyopathy with an EF of 35% and during his admission for iron deficiency anemia and he was also found to have left bundle branch block and underwent cardiac catheterization which did not show any abnormalities. Patient has also had episodes of melena in February 2018 for which she was admitted to [facility] and was given PPI but did not undergo any EGD or colonoscopy at that time. Capsule study has also been completed.  pateint has received 2 doses of feraheme so far   Interval history- doing well. Denies any complaints today    Review of systems- Review of Systems  Constitutional:  Negative for chills, fever, malaise/fatigue and weight loss.  HENT: Negative for congestion, ear discharge and nosebleeds.   Eyes: Negative for blurred vision.  Respiratory: Negative for cough, hemoptysis, sputum production, shortness of breath and wheezing.   Cardiovascular: Negative for chest pain, palpitations, orthopnea and claudication.  Gastrointestinal: Negative for abdominal pain, blood in stool, constipation, diarrhea, heartburn, melena, nausea and vomiting.  Genitourinary: Negative for dysuria, flank pain, frequency, hematuria and urgency.  Musculoskeletal: Negative for back pain, joint pain and myalgias.  Skin: Negative for rash.  Neurological: Negative for dizziness, tingling, focal weakness, seizures, weakness and headaches.  Endo/Heme/Allergies: Does not bruise/bleed easily.  Psychiatric/Behavioral: Negative for depression and suicidal ideas. The patient does not have insomnia.      Current treatment- 2 doses of IV iron  Allergies  Allergen Reactions  . Mobic [Meloxicam] Other (See Comments)    Ulcers in stomach eruption   . Diclofenac Other (See Comments)    Fast heart beat,, CP,SOB and weakness on one side.     Past Medical History:  Diagnosis Date  . Acute blood loss anemia 11/20/2016  . CHF (congestive heart failure) ([place])   . Coronary artery disease   . Diabetes mellitus type I ([place])   . Slipped intervertebral disc    L4 L5  . STEMI (ST elevation myocardial infarction) [facility])      Past Surgical History:  Procedure Laterality Date  . COLONOSCOPY WITH PROPOFOL N/A 12/03/2016   Procedure: COLONOSCOPY WITH PROPOFOL;  Surgeon: [name], MD;  Location: [facility] ENDOSCOPY;  Service: Endoscopy;  Laterality: N/A;  .  ESOPHAGOGASTRODUODENOSCOPY (EGD) WITH PROPOFOL N/A 12/03/2016   Procedure: ESOPHAGOGASTRODUODENOSCOPY (EGD) WITH PROPOFOL;  Surgeon: [name], MD;  Location: [facility] ENDOSCOPY;  Service: Endoscopy;  Laterality: N/A;  . GASTRIC BYPASS    . GIVENS CAPSULE  STUDY N/A 01/07/2017   Procedure: GIVENS CAPSULE STUDY;  Surgeon: [name], MD;  Location: [facility] ENDOSCOPY;  Service: Endoscopy;  Laterality: N/A;  . LEFT HEART CATH AND CORONARY ANGIOGRAPHY N/A 12/04/2016   Procedure: Left Heart Cath and Coronary Angiography;  Surgeon: [name], MD;  Location: [place] CV LAB;  Service: Cardiovascular;  Laterality: N/A;    Social History   Social History  . Marital status: Married    Spouse name: N/A  . Number of children: N/A  . Years of education: N/A   Occupational History  . 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 Smoking status: Never Smoker  . Smokeless tobacco: Never Used  . Alcohol use No  . Drug use: No  . Sexual activity: Yes   Other Topics Concern  . Not on file   Social History Narrative   Regular exercise-yes, walking one mile per day   Diet: fast food, diet soda, unsweeted tea    Family History  Problem Relation Age of Onset  . Diabetes Mother   . Hypertension Mother   . Cancer Mother     breast  . Diabetes Father   . Hypertension Father   . Cancer Father     colon  . Cancer Maternal Grandfather     prostate  . Stroke Maternal Grandfather     CVA     Current Outpatient Prescriptions:  .  calcium carbonate (OS-CAL) 600 MG TABS tablet, Take 600 mg by mouth 2 (two) times daily with a meal., Disp: , Rfl:  .  carvedilol (COREG) 3.125 MG tablet, Take 1 tablet (3.125 mg total) by mouth 2 (two) times daily., Disp: 180 tablet, Rfl: 3 .  furosemide (LASIX) 20 MG tablet, Take 1 tablet (20 mg total) by mouth daily as needed. Take for > 2lb weight gain in 1 day or > 5lb weight gain in 1 week., Disp: 90 tablet, Rfl: 3 .  glucose blood (ONE TOUCH ULTRA TEST) test strip, Check blood sugar once daily and as instructed. Dx E11.9, Disp: 100 each, Rfl: 1 .  Insulin Glargine (TOUJEO SOLOSTAR) 300 UNIT/ML SOPN, Inject 10 Units into the skin daily. (Patient taking differently:  Inject 15 Units into the skin daily. ), Disp: 1.5 mL, Rfl: 11 .  metFORMIN (GLUCOPHAGE-XR) 500 MG 24 hr tablet, Take 4 tablets (2,000 mg total) by mouth daily with breakfast. (Patient taking differently: Take 1,000 mg by mouth 2 (two) times daily. ), Disp: 120 tablet, Rfl: 11 .  Multiple Vitamins-Minerals (MENS MULTI VITAMIN & MINERAL PO), Take 2 tablets by mouth daily. Men's Vita Fusion, Disp: , Rfl:  .  Omega-3 Fatty Acids (FISH OIL) 1200 MG CAPS, Take 1 capsule by mouth daily., Disp: , Rfl:  .  omeprazole (PRILOSEC) 40 MG capsule, Take 1 capsule (40 mg total) by mouth daily., Disp: 30 capsule, Rfl: 0 .  sacubitril-valsartan (ENTRESTO) 24-26 MG, Take 1 tablet by mouth 2 (two) times daily., Disp: 60 tablet, Rfl: 5  Physical exam:  Vitals:   01/29/17 1049  BP: 131/88  Pulse: 81  Temp: 97.5 F (36.4 C)  TempSrc: Tympanic  Weight: 185 lb 8 oz (84.1 kg)   Physical Exam  Constitutional: He is oriented to person, place,  and time and well-developed, well-nourished, and in no distress.  HENT:  Head: Normocephalic and atraumatic.  Eyes: EOM are normal. Pupils are equal, round, and reactive to light.  Neck: Normal range of motion.  Cardiovascular: Normal rate, regular rhythm and normal heart sounds.   Pulmonary/Chest: Effort normal and breath sounds normal.  Abdominal: Soft. Bowel sounds are normal.  Neurological: He is alert and oriented to person, place, and time.  Skin: Skin is warm and dry.     CMP Latest Ref Rng & Units 01/14/2017  Glucose 65 - 99 mg/dL 135(H)  BUN 6 - 20 mg/dL 18  Creatinine 0.61 - 1.24 mg/dL 0.88  Sodium 135 - 145 mmol/L 138  Potassium 3.5 - 5.1 mmol/L 4.1  Chloride 101 - 111 mmol/L 106  CO2 22 - 32 mmol/L 28  Calcium 8.9 - 10.3 mg/dL 8.7(L)  Total Protein 6.0 - 8.3 g/dL -  Total Bilirubin 0.2 - 1.2 mg/dL -  Alkaline Phos 39 - 117 U/L -  AST 0 - 37 U/L -  ALT 0 - 53 U/L -   CBC Latest Ref Rng & Units 01/29/2017  WBC 3.8 - 10.6 K/uL 4.2  Hemoglobin 13.0 -  18.0 g/dL 11.9(L)  Hematocrit 40.0 - 52.0 % 36.0(L)  Platelets 150 - 440 K/uL 233    Assessment and plan- Patient is a 46 y.o. male with h/o iron deficiency anemia likely due to gastric bypass  H/H improved to 11.9/36 today. Iron studies pending. Will decide about IV iron based on iron studies from today. He will get repeat cbc and iron studies in 2 months and see me in 4 months with same blood work   Visit Diagnosis 1. Other iron deficiency anemia      Dr. Randa Evens, MD, MPH [facility] Pager- [phone] 01/29/2017 11:32 AM

## 2017-01-30 NOTE — Addendum Note (Signed)
Addended by: Luella Cook on: 01/30/2017 10:06 AM   Modules accepted: Orders

## 2017-01-31 DIAGNOSIS — I213 ST elevation (STEMI) myocardial infarction of unspecified site: Secondary | ICD-10-CM

## 2017-01-31 DIAGNOSIS — I5022 Chronic systolic (congestive) heart failure: Secondary | ICD-10-CM

## 2017-01-31 LAB — GLUCOSE, CAPILLARY
Glucose-Capillary: 152 mg/dL — ABNORMAL HIGH (ref 65–99)
Glucose-Capillary: 106 mg/dL — ABNORMAL HIGH (ref 65–99)

## 2017-01-31 NOTE — Progress Notes (Signed)
Daily Session Note  Patient Details  Name: Philip Ponce MRN: [id number] Date of Birth: 09/27/1970 Referring Provider:     Cardiac Rehab from 01/14/2017 in [facility]  Referring Provider  End, Christopher MD      Encounter Date: 01/31/2017  Check In:     Session Check In - 01/31/17 0855      Check-In   Location [facility]   Staff Present Jessica Hawkins, MA, ACSM RCEP, Exercise Physiologist;Amanda Sommer, BA, ACSM CEP, Exercise Physiologist;Other  Krista Spencer RN, Meredith Craven RN   Supervising physician immediately available to respond to emergencies See telemetry face sheet for immediately available ER MD   Medication changes reported     No   Fall or balance concerns reported    No   Warm-up and Cool-down Performed on first and last piece of equipment   Resistance Training Performed Yes   VAD Patient? No         History  Smoking Status  . Never Smoker  Smokeless Tobacco  . Never Used    Goals Met:  Independence with exercise equipment Exercise tolerated well No report of cardiac concerns or symptoms Strength training completed today  Goals Unmet:  Not Applicable  Comments: Pt able to follow exercise prescription today without complaint.  Will continue to monitor for progression.    Dr. Mark Miller is Medical Director for [facility] and [facility]. 

## 2017-02-07 ENCOUNTER — Encounter: Payer: 59 | Admitting: *Deleted

## 2017-02-07 DIAGNOSIS — I5022 Chronic systolic (congestive) heart failure: Secondary | ICD-10-CM

## 2017-02-07 DIAGNOSIS — I213 ST elevation (STEMI) myocardial infarction of unspecified site: Secondary | ICD-10-CM

## 2017-02-07 NOTE — Progress Notes (Signed)
Daily Session Note  Patient Details  Name: Philip Ponce MRN: [id number] Date of Birth: 05/15/1971 Referring Provider:     Cardiac Rehab from 01/14/2017 in [facility]  Referring Provider  End, Christopher MD      Encounter Date: 02/07/2017  Check In:     Session Check In - 02/07/17 0828      Check-In   Location [facility]   Staff Present Jessica Hawkins, MA, ACSM RCEP, Exercise Physiologist;Amanda Sommer, BA, ACSM CEP, Exercise Physiologist; , RN BSN;Meredith Craven, RN BSN   Supervising physician immediately available to respond to emergencies See telemetry face sheet for immediately available ER MD   Medication changes reported     No   Fall or balance concerns reported    No   Warm-up and Cool-down Performed on first and last piece of equipment   Resistance Training Performed Yes   VAD Patient? No     Pain Assessment   Currently in Pain? No/denies   Multiple Pain Sites No           Exercise Prescription Changes - 02/06/17 1100      Response to Exercise   Blood Pressure (Admit) 142/64   Blood Pressure (Exercise) 144/76   Blood Pressure (Exit) 124/64   Heart Rate (Admit) 99 bpm   Heart Rate (Exercise) 151 bpm   Heart Rate (Exit) 88 bpm   Rating of Perceived Exertion (Exercise) 13   Symptoms none   Duration Progress to 45 minutes of aerobic exercise without signs/symptoms of physical distress   Intensity THRR unchanged     Progression   Progression Continue to progress workloads to maintain intensity without signs/symptoms of physical distress.   Average METs 3.4     Resistance Training   Training Prescription Yes   Weight 4 lbs   Reps 10-15     Treadmill   MPH 3   Grade 0.5   Minutes 15   METs 3.5     Elliptical   Level 1   Speed 4.5   Minutes 15     T5 Nustep   Level 3   Minutes 15   METs 3.3      History  Smoking Status  . Never Smoker  Smokeless Tobacco  . Never Used     Goals Met:  Independence with exercise equipment Exercise tolerated well No report of cardiac concerns or symptoms Strength training completed today  Goals Unmet:  Not Applicable  Comments: Pt able to follow exercise prescription today without complaint.  Will continue to monitor for progression. Reviewed home exercise with pt today.  Pt plans to walk and go to gym ([employer]) for exercise.  Reviewed THR, pulse, RPE, sign and symptoms, and when to call 911 or MD.  Also discussed weather considerations and indoor options.  Pt voiced understanding.   Dr. Mark Miller is Medical Director for [facility] and [facility]. 

## 2017-02-13 ENCOUNTER — Encounter: Payer: Self-pay | Admitting: *Deleted

## 2017-02-13 DIAGNOSIS — I213 ST elevation (STEMI) myocardial infarction of unspecified site: Secondary | ICD-10-CM

## 2017-02-13 DIAGNOSIS — I5022 Chronic systolic (congestive) heart failure: Secondary | ICD-10-CM

## 2017-02-13 NOTE — Progress Notes (Signed)
Cardiac Individual Treatment Plan  Patient Details  Name: Philip Ponce MRN: [id number] Date of Birth: 09/01/1971 Referring Provider:     Cardiac Rehab from 01/14/2017 in [facility] Cardiac and Pulmonary Rehab  Referring Provider  End, Harrell Gave MD      Initial Encounter Date:    Cardiac Rehab from 01/14/2017 in [facility] Cardiac and Pulmonary Rehab  Date  01/14/17  Referring Provider  End, Harrell Gave MD      Visit Diagnosis: ST elevation myocardial infarction (STEMI), unspecified artery ([place])  Heart failure, chronic systolic ([place])  Patient's Home Medications on Admission:  Current Outpatient Prescriptions:  .  calcium carbonate (OS-CAL) 600 MG TABS tablet, Take 600 mg by mouth 2 (two) times daily with a meal., Disp: , Rfl:  .  carvedilol (COREG) 3.125 MG tablet, Take 1 tablet (3.125 mg total) by mouth 2 (two) times daily., Disp: 180 tablet, Rfl: 3 .  furosemide (LASIX) 20 MG tablet, Take 1 tablet (20 mg total) by mouth daily as needed. Take for > 2lb weight gain in 1 day or > 5lb weight gain in 1 week., Disp: 90 tablet, Rfl: 3 .  glucose blood (ONE TOUCH ULTRA TEST) test strip, Check blood sugar once daily and as instructed. Dx E11.9, Disp: 100 each, Rfl: 1 .  Insulin Glargine (TOUJEO SOLOSTAR) 300 UNIT/ML SOPN, Inject 10 Units into the skin daily. (Patient taking differently: Inject 15 Units into the skin daily. ), Disp: 1.5 mL, Rfl: 11 .  metFORMIN (GLUCOPHAGE-XR) 500 MG 24 hr tablet, Take 4 tablets (2,000 mg total) by mouth daily with breakfast. (Patient taking differently: Take 1,000 mg by mouth 2 (two) times daily. ), Disp: 120 tablet, Rfl: 11 .  Multiple Vitamins-Minerals (MENS MULTI VITAMIN & MINERAL PO), Take 2 tablets by mouth daily. Men's Vita Fusion, Disp: , Rfl:  .  Omega-3 Fatty Acids (FISH OIL) 1200 MG CAPS, Take 1 capsule by mouth daily., Disp: , Rfl:  .  omeprazole (PRILOSEC) 40 MG capsule, Take 1 capsule (40 mg total) by mouth daily., Disp: 30 capsule, Rfl: 0 .   sacubitril-valsartan (ENTRESTO) 24-26 MG, Take 1 tablet by mouth 2 (two) times daily., Disp: 60 tablet, Rfl: 5  Past Medical History: Past Medical History:  Diagnosis Date  . Acute blood loss anemia 11/20/2016  . CHF (congestive heart failure) ([place])   . Coronary artery disease   . Diabetes mellitus type I ([place])   . Slipped intervertebral disc    L4 L5  . STEMI (ST elevation myocardial infarction) ([place])     Tobacco Use: History  Smoking Status  . Never Smoker  Smokeless Tobacco  . Never Used    Labs: Recent Review Flowsheet Data    Labs for ITP Cardiac and Pulmonary Rehab Latest Ref Rng & Units 01/01/2013 04/15/2015 10/07/2015 01/03/2016 11/27/2016   Cholestrol 0 - 200 mg/dL - 101 121 - -   LDLCALC 0 - 99 mg/dL - 38 39 - -   HDL >39.00 mg/dL - 51.40 66.70 - -   Trlycerides 0.0 - 149.0 mg/dL - 58.0 78.0 - -   Hemoglobin A1c 4.6 - 6.5 % 8.6(H) 7.0(H) 10.5(H) 10.6(H) 9.6(H)       Exercise Target Goals:    Exercise Program Goal: Individual exercise prescription set with THRR, safety & activity barriers. Participant demonstrates ability to understand and report RPE using BORG scale, to self-measure pulse accurately, and to acknowledge the importance of the exercise prescription.  Exercise Prescription Goal: Starting with aerobic activity 30 plus minutes a  day, 3 days per week for initial exercise prescription. Provide home exercise prescription and guidelines that participant acknowledges understanding prior to discharge.  Activity Barriers & Risk Stratification:     Activity Barriers & Cardiac Risk Stratification - 01/14/17 1311      Activity Barriers & Cardiac Risk Stratification   Activity Barriers Balance Concerns;Decreased Ventricular Function;Deconditioning;Muscular Weakness  he said that sometimes his equilibrium gets off   Cardiac Risk Stratification High      6 Minute Walk:     6 Minute Walk    Row Name 01/14/17 1437         6 Minute Walk   Phase Initial      Distance 1570 feet     Walk Time 6 minutes     # of Rest Breaks 0     MPH 2.97     METS 5.17     RPE 9     VO2 Peak 18.11     Symptoms No     Resting HR 75 bpm     Resting BP 138/70     Max Ex. HR 112 bpm     Max Ex. BP 146/74     2 Minute Post BP 134/70        Oxygen Initial Assessment:   Oxygen Re-Evaluation:   Oxygen Discharge (Final Oxygen Re-Evaluation):   Initial Exercise Prescription:     Initial Exercise Prescription - 01/14/17 1400      Date of Initial Exercise RX and Referring Provider   Date 01/14/17   Referring Provider End, Harrell Gave MD     Treadmill   MPH 3   Grade 0.5   Minutes 15   METs 3.5     Elliptical   Level 1   Speed 4.5   Minutes 15     T5 Nustep   Level 3   SPM 80   Minutes 15   METs 2     Prescription Details   Frequency (times per week) 2   Duration Progress to 45 minutes of aerobic exercise without signs/symptoms of physical distress     Intensity   THRR 40-80% of Max Heartrate 114-153   Ratings of Perceived Exertion 11-13   Perceived Dyspnea 0-4     Progression   Progression Continue to progress workloads to maintain intensity without signs/symptoms of physical distress.     Resistance Training   Training Prescription Yes   Weight 4 lbs   Reps 10-15      Perform Capillary Blood Glucose checks as needed.  Exercise Prescription Changes:     Exercise Prescription Changes    Row Name 01/14/17 1400 02/06/17 1100 02/07/17 1000         Response to Exercise   Blood Pressure (Admit) 138/70 142/64  -     Blood Pressure (Exercise) 146/74 144/76  -     Blood Pressure (Exit) 134/70 124/64  -     Heart Rate (Admit) 75 bpm 99 bpm  -     Heart Rate (Exercise) 112 bpm 151 bpm  -     Heart Rate (Exit) 89 bpm 88 bpm  -     Oxygen Saturation (Admit) 100 %  -  -     Oxygen Saturation (Exercise) 100 %  -  -     Rating of Perceived Exertion (Exercise) 9 13  -     Symptoms none none none     Comments walk test results   -  -  Duration  - Progress to 45 minutes of aerobic exercise without signs/symptoms of physical distress Progress to 45 minutes of aerobic exercise without signs/symptoms of physical distress     Intensity  - THRR unchanged THRR unchanged       Progression   Progression  - Continue to progress workloads to maintain intensity without signs/symptoms of physical distress. Continue to progress workloads to maintain intensity without signs/symptoms of physical distress.     Average METs  - 3.4 3.4       Resistance Training   Training Prescription  - Yes Yes     Weight  - 4 lbs 4 lbs     Reps  - 10-15 10-15       Treadmill   MPH  - 3 3     Grade  - 0.5 0.5     Minutes  - 15 15     METs  - 3.5 3.5       Elliptical   Level  - 1 1     Speed  - 4.5 4.5     Minutes  - 15 15       T5 Nustep   Level  - 3 3     Minutes  - 15 15     METs  - 3.3 3.3       Home Exercise Plan   Plans to continue exercise at  -  - [employer] (comment)  walking and [employer]     Frequency  -  - Add 3 additional days to program exercise sessions.     Initial Home Exercises Provided  -  - 02/07/17        Exercise Comments:     Exercise Comments    Row Name 01/24/17 1049           Exercise Comments First full day of exercise!  Patient was oriented to gym and equipment including functions, settings, policies, and procedures.  Patient's individual exercise prescription and treatment plan were reviewed.  All starting workloads were established based on the results of the 6 minute walk test done at initial orientation visit.  The plan for exercise progression was also introduced and progression will be customized based on patient's performance and goals.          Exercise Goals and Review:     Exercise Goals    Row Name 01/14/17 1439             Exercise Goals   Increase Physical Activity Yes       Intervention Develop an individualized exercise prescription for aerobic and resistive  training based on initial evaluation findings, risk stratification, comorbidities and participant's personal goals.;Provide advice, education, support and counseling about physical activity/exercise needs.       Expected Outcomes Achievement of increased cardiorespiratory fitness and enhanced flexibility, muscular endurance and strength shown through measurements of functional capacity and personal statement of participant.       Increase Strength and Stamina Yes       Intervention Provide advice, education, support and counseling about physical activity/exercise needs.;Develop an individualized exercise prescription for aerobic and resistive training based on initial evaluation findings, risk stratification, comorbidities and participant's personal goals.       Expected Outcomes Achievement of increased cardiorespiratory fitness and enhanced flexibility, muscular endurance and strength shown through measurements of functional capacity and personal statement of participant.          Exercise Goals Re-Evaluation :  Exercise Goals Re-Evaluation    [place] Name 02/06/17 1058 02/07/17 1010           Exercise Goal Re-Evaluation   Exercise Goals Review Increase Physical Activity;Increase Strenth and Stamina Increase Physical Activity;Increase Strenth and Stamina      Comments Martel is off to a good start with rehab.  He is doing well and has completed two full sessions.  We will continue to monitor his progression. Reviewed home exercise with pt today.  Pt plans to walk and go to gym ([employer]) for exercise.  Reviewed THR, pulse, RPE, sign and symptoms, and when to call 911 or MD.  Also discussed weather considerations and indoor options.  Pt voiced understanding.      Expected Outcomes Short: Start to increase workloads.  Long: Continue to work on IT sales professional. ShortL Start to add more exercise in on off days.  Long: Make exercise part of routine.         Discharge Exercise  Prescription (Final Exercise Prescription Changes):     Exercise Prescription Changes - 02/07/17 1000      Response to Exercise   Symptoms none   Duration Progress to 45 minutes of aerobic exercise without signs/symptoms of physical distress   Intensity THRR unchanged     Progression   Progression Continue to progress workloads to maintain intensity without signs/symptoms of physical distress.   Average METs 3.4     Resistance Training   Training Prescription Yes   Weight 4 lbs   Reps 10-15     Treadmill   MPH 3   Grade 0.5   Minutes 15   METs 3.5     Elliptical   Level 1   Speed 4.5   Minutes 15     T5 Nustep   Level 3   Minutes 15   METs 3.3     Home Exercise Plan   Plans to continue exercise at [employer] (comment)  walking and [employer]   Frequency Add 3 additional days to program exercise sessions.   Initial Home Exercises Provided 02/07/17      Nutrition:  Target Goals: Understanding of nutrition guidelines, daily intake of sodium 1500mg , cholesterol 200mg , calories 30% from fat and 7% or less from saturated fats, daily to have 5 or more servings of fruits and vegetables.  Biometrics:     Pre Biometrics - 01/14/17 1440      Pre Biometrics   Height 6' 0.8" (1.849 m)   Weight 183 lb 4.8 oz (83.1 kg)   Waist Circumference 30 inches   Hip Circumference 37.5 inches   Waist to Hip Ratio 0.8 %   BMI (Calculated) 24.4   Single Leg Stand 1.98 seconds       Nutrition Therapy Plan and Nutrition Goals:     Nutrition Therapy & Goals - 01/14/17 1302      Nutrition Therapy   RD appointment defered Yes      Nutrition Discharge: Rate Your Plate Scores:     Nutrition Assessments - 01/14/17 1311      MEDFICTS Scores   Pre Score 48      Nutrition Goals Re-Evaluation:   Nutrition Goals Discharge (Final Nutrition Goals Re-Evaluation):   Psychosocial: Target Goals: Acknowledge presence or absence of significant depression and/or  stress, maximize coping skills, provide positive support system. Participant is able to verbalize types and ability to use techniques and skills needed for reducing stress and depression.   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      Family Dynamics   Good Support System? Yes     Barriers   Psychosocial barriers to participate in program The patient should benefit from training in stress management and relaxation.     Screening Interventions   Interventions Encouraged to exercise;To provide support and resources with identified psychosocial needs      Quality of Life Scores:      Quality of Life - 01/14/17 1309      Quality of Life Scores   Health/Function Pre 30 %   Socioeconomic Pre 30 %   Psych/Spiritual Pre 30 %   Family Pre 30 %   GLOBAL Pre 30 %      PHQ-9: Recent Review Flowsheet Data    Depression screen [name] 2/9 01/14/2017 12/17/2016   Decreased Interest 0 0   Down, Depressed, Hopeless 0 0   PHQ - 2 Score 0 0   Altered sleeping 0 -   Tired, decreased energy 0 -   Change in appetite 0 -   Feeling bad or failure about yourself  0 -   Trouble concentrating 0 -   Moving slowly or fidgety/restless 0 -   Suicidal thoughts 0 -   Difficult doing work/chores Not difficult at all -     Interpretation of Total Score  Total Score Depression Severity:  1-4 = Minimal depression, 5-9 = Mild depression, 10-14 = Moderate depression, 15-19 = Moderately severe depression, 20-27 = Severe depression   Psychosocial Evaluation and Intervention:   Psychosocial Re-Evaluation:   Psychosocial Discharge (Final  Psychosocial Re-Evaluation):   Vocational Rehabilitation: Provide vocational rehab assistance to qualifying candidates.   Vocational Rehab Evaluation & Intervention:     Vocational Rehab - 01/14/17 1302      Initial Vocational Rehab Evaluation & Intervention   Assessment shows need for Vocational Rehabilitation No      Education: Education Goals: Education classes will be provided on a weekly basis, covering required topics. Participant will state understanding/return demonstration of topics presented.  Learning Barriers/Preferences:     Learning Barriers/Preferences - 01/14/17 1301      Learning Barriers/Preferences   Learning Barriers None   Learning Preferences Skilled Demonstration      Education Topics: General Nutrition Guidelines/Fats and Fiber: -Group instruction provided by verbal, written material, models and posters to present the general guidelines for heart healthy nutrition. Gives an explanation and review of dietary fats and fiber.   Controlling Sodium/Reading Food Labels: -Group verbal and written material supporting the discussion of sodium use in heart healthy nutrition. Review and explanation with models, verbal and written materials for utilization of the food label.   Cardiac Rehab from 02/07/2017 in [facility] Cardiac and Pulmonary Rehab  Date  01/29/17  Educator  PI  Instruction Review Code  2- meets goals/outcomes      Exercise Physiology & Risk Factors: - Group verbal and written instruction with models to review the exercise physiology of the cardiovascular system and associated critical values. Details cardiovascular disease risk factors and the goals associated with each risk  factor.   Aerobic Exercise & Resistance Training: - Gives group verbal and written discussion on the health impact of inactivity. On the components of aerobic and resistive training programs and the benefits of this training and how to safely progress through these  programs.   Cardiac Rehab from 02/07/2017 in [facility] Cardiac and Pulmonary Rehab  Date  02/07/17  Educator  [facility]  Instruction Review Code  2- meets goals/outcomes      Flexibility, Balance, General Exercise Guidelines: - Provides group verbal and written instruction on the benefits of flexibility and balance training programs. Provides general exercise guidelines with specific guidelines to those with heart or lung disease. Demonstration and skill practice provided.   Stress Management: - Provides group verbal and written instruction about the health risks of elevated stress, cause of high stress, and healthy ways to reduce stress.   Depression: - Provides group verbal and written instruction on the correlation between heart/lung disease and depressed mood, treatment options, and the stigmas associated with seeking treatment.   Cardiac Rehab from 02/07/2017 in [facility] Cardiac and Pulmonary Rehab  Date  01/31/17  Educator  [facility]  Instruction Review Code  2- meets goals/outcomes      Anatomy & Physiology of the Heart: - Group verbal and written instruction and models provide basic cardiac anatomy and physiology, with the coronary electrical and arterial systems. Review of: AMI, Angina, Valve disease, Heart Failure, Cardiac Arrhythmia, Pacemakers, and the ICD.   Cardiac Rehab from 02/07/2017 in [facility] Cardiac and Pulmonary Rehab  Date  01/24/17  Educator  [place]  Instruction Review Code  2- meets goals/outcomes      Cardiac Procedures: - Group verbal and written instruction and models to describe the testing methods done to diagnose heart disease. Reviews the outcomes of the test results. Describes the treatment choices: Medical Management, Angioplasty, or Coronary Bypass Surgery.   Cardiac Medications: - Group verbal and written instruction to review commonly prescribed medications for heart disease. Reviews the medication, class of the drug, and side effects. Includes the steps to properly  store meds and maintain the prescription regimen.   Go Sex-Intimacy & Heart Disease, Get SMART - Goal Setting: - Group verbal and written instruction through game format to discuss heart disease and the return to sexual intimacy. Provides group verbal and written material to discuss and apply goal setting through the application of the S.M.A.R.T. Method.   Other Matters of the Heart: - Provides group verbal, written materials and models to describe Heart Failure, Angina, Valve Disease, and Diabetes in the realm of heart disease. Includes description of the disease process and treatment options available to the cardiac patient.   Cardiac Rehab from 02/07/2017 in [facility] Cardiac and Pulmonary Rehab  Date  01/24/17  Educator  [place]  Instruction Review Code  2- meets goals/outcomes      Exercise & Equipment Safety: - Individual verbal instruction and demonstration of equipment use and safety with use of the equipment.   Cardiac Rehab from 02/07/2017 in [facility] Cardiac and Pulmonary Rehab  Date  01/14/17  Educator  C. Enterkin,RN  Instruction Review Code  1- partially meets, needs review/practice      Infection Prevention: - Provides verbal and written material to individual with discussion of infection control including proper hand washing and proper equipment cleaning during exercise session.   Cardiac Rehab from 02/07/2017 in [facility] Cardiac and Pulmonary Rehab  Date  01/14/17  Educator  C. Enterkin, RN  Instruction Review Code  2- meets  goals/outcomes      Falls Prevention: - Provides verbal and written material to individual with discussion of falls prevention and safety.   Cardiac Rehab from 02/07/2017 in [facility] Cardiac and Pulmonary Rehab  Date  01/14/17  Educator  C. ENterkinRN  Instruction Review Code  1- partially meets, needs review/practice      Diabetes: - Individual verbal and written instruction to review signs/symptoms of diabetes, desired ranges of glucose level fasting, after  meals and with exercise. Advice that pre and post exercise glucose checks will be done for 3 sessions at entry of program.   Cardiac Rehab from 02/07/2017 in [facility] Cardiac and Pulmonary Rehab  Date  01/14/17  Educator  C. [place]  Instruction Review Code  1- partially meets, needs review/practice       Knowledge Questionnaire Score:     Knowledge Questionnaire Score - 01/14/17 1310      Knowledge Questionnaire Score   Pre Score 10/28      Core Components/Risk Factors/Patient Goals at Admission:     Personal Goals and Risk Factors at Admission - 01/14/17 1311      Core Components/Risk Factors/Patient Goals on Admission    Weight Management Yes;Weight Maintenance   Intervention Weight Management: Develop a combined nutrition and exercise program designed to reach desired caloric intake, while maintaining appropriate intake of nutrient and fiber, sodium and fats, and appropriate energy expenditure required for the weight goal.;Weight Management: Provide education and appropriate resources to help participant work on and attain dietary goals.   Admit Weight 183 lb 4.8 oz (83.1 kg)   Expected Outcomes Short Term: Continue to assess and modify interventions until short term weight is achieved;Long Term: Adherence to nutrition and physical activity/exercise program aimed toward attainment of established weight goal;Weight Maintenance: Understanding of the daily nutrition guidelines, which includes 25-35% calories from fat, 7% or less cal from saturated fats, less than 200mg  cholesterol, less than 1.5gm of sodium, & 5 or more servings of fruits and vegetables daily   Diabetes Yes   Intervention Provide education about signs/symptoms and action to take for hypo/hyperglycemia.;Provide education about proper nutrition, including hydration, and aerobic/resistive exercise prescription along with prescribed medications to achieve blood glucose in normal ranges: Fasting glucose 65-99 mg/dL    Expected Outcomes Short Term: Participant verbalizes understanding of the signs/symptoms and immediate care of hyper/hypoglycemia, proper foot care and importance of medication, aerobic/resistive exercise and nutrition plan for blood glucose control.;Long Term: Attainment of HbA1C < 7%.   Heart Failure Yes   Intervention Provide a combined exercise and nutrition program that is supplemented with education, support and counseling about heart failure. Directed toward relieving symptoms such as shortness of breath, decreased exercise tolerance, and extremity edema.   Expected Outcomes Improve functional capacity of life;Short term: Attendance in program 2-3 days a week with increased exercise capacity. Reported lower sodium intake. Reported increased fruit and vegetable intake. Reports medication compliance.;Short term: Daily weights obtained and reported for increase. Utilizing diuretic protocols set by physician.;Long term: Adoption of self-care skills and reduction of barriers for early signs and symptoms recognition and intervention leading to self-care maintenance.   Stress Yes  His Mother was in the hospital same time he was   Intervention Offer individual and/or small group education and counseling on adjustment to heart disease, stress management and health-related lifestyle change. Teach and support self-help strategies.;Refer participants experiencing significant psychosocial distress to appropriate mental health specialists for further evaluation and treatment. When possible, include family members and significant others in  education/counseling sessions.   Expected Outcomes Short Term: Participant demonstrates changes in health-related behavior, relaxation and other stress management skills, ability to obtain effective social support, and compliance with psychotropic medications if prescribed.;Long Term: Emotional wellbeing is indicated by absence of clinically significant psychosocial distress or  social isolation.      Core Components/Risk Factors/Patient Goals Review:    Core Components/Risk Factors/Patient Goals at Discharge (Final Review):    ITP Comments:     ITP Comments    Row Name 01/14/17 1123 01/16/17 0619 02/13/17 0901       ITP Comments ITP Created during Medical review Documentation DX of 12/19/2016 [place] Care Note 30 day review. Continue with ITP unless directed changes per Medical Director review   New to program has attended medical review 30 day review. Continue with ITP unless directed changes per Medical Director review  new to program         Comments:

## 2017-02-15 ENCOUNTER — Encounter: Payer: Self-pay | Admitting: Family

## 2017-02-15 ENCOUNTER — Ambulatory Visit: Payer: 59 | Attending: Family | Admitting: Family

## 2017-02-15 VITALS — BP 136/92 | HR 92 | Resp 20 | Ht 73.0 in | Wt 188.4 lb

## 2017-02-15 DIAGNOSIS — I110 Hypertensive heart disease with heart failure: Secondary | ICD-10-CM | POA: Insufficient documentation

## 2017-02-15 DIAGNOSIS — I5022 Chronic systolic (congestive) heart failure: Secondary | ICD-10-CM

## 2017-02-15 DIAGNOSIS — I10 Essential (primary) hypertension: Secondary | ICD-10-CM

## 2017-02-15 DIAGNOSIS — E119 Type 2 diabetes mellitus without complications: Secondary | ICD-10-CM | POA: Diagnosis not present

## 2017-02-15 MED ORDER — SACUBITRIL-VALSARTAN 49-51 MG PO TABS: 1.0000 | ORAL_TABLET | Freq: Two times a day (BID) | ORAL | 5 refills | Status: DC

## 2017-02-15 NOTE — Progress Notes (Signed)
Patient ID: Philip Ponce, male    DOB: 1971-05-13, 46 y.o.   MRN: [id number]  HPI  Philip Ponce is a 46 yoM with PMH significant for DM, iron deficiency, HTN, gastric bypass, STEMI and CHF with reduced ejection fraction.  Last echo was done 12/05/16 and showed an EF of 20-25% with mild Philip. Echo on 04/14/15 showed EF of 50-55%.  Last admission was 12/04/16 for chest pain. Workup for STEMI and cath showed no significant CAD. Pt also was experiencing symptoms of CHF and was treated with IV furosemide.   Presents today for his chief complaint of a follow-up visit for his HF. He has some postnasal drip, cough, and shortness of breath from a cold. Experiencing fatigue from not sleeping well at night. He is taking Robitussin and Tussin for his cold with no relief for the past 3 to 4 days.   Past Medical History:  Diagnosis Date  . Acute blood loss anemia 11/20/2016  . CHF (congestive heart failure) ([place])   . Coronary artery disease   . Diabetes mellitus type I ([place])   . Slipped intervertebral disc    L4 L5  . STEMI (ST elevation myocardial infarction) [facility])    Past Surgical History:  Procedure Laterality Date  . COLONOSCOPY WITH PROPOFOL N/A 12/03/2016   Procedure: COLONOSCOPY WITH PROPOFOL;  Surgeon: [name], MD;  Location: [facility] ENDOSCOPY;  Service: Endoscopy;  Laterality: N/A;  . ESOPHAGOGASTRODUODENOSCOPY (EGD) WITH PROPOFOL N/A 12/03/2016   Procedure: ESOPHAGOGASTRODUODENOSCOPY (EGD) WITH PROPOFOL;  Surgeon: [name], MD;  Location: [facility] ENDOSCOPY;  Service: Endoscopy;  Laterality: N/A;  . GASTRIC BYPASS    . GIVENS CAPSULE STUDY N/A 01/07/2017   Procedure: GIVENS CAPSULE STUDY;  Surgeon: [name], MD;  Location: [facility] ENDOSCOPY;  Service: Endoscopy;  Laterality: N/A;  . LEFT HEART CATH AND CORONARY ANGIOGRAPHY N/A 12/04/2016   Procedure: Left Heart Cath and Coronary Angiography;  Surgeon: [name], MD;  Location: [facility] LAB;  Service: Cardiovascular;  Laterality:  N/A;   Family History  Problem Relation Age of Onset  . Diabetes Mother   . Hypertension Mother   . Cancer Mother        breast  . Diabetes Father   . Hypertension Father   . Cancer Father        colon  . Cancer Maternal Grandfather        prostate  . Stroke Maternal Grandfather        CVA   Social History  Substance Use Topics  . Smoking status: Never Smoker  . Smokeless tobacco: Never Used  . Alcohol use No   Allergies  Allergen Reactions  . Mobic [Meloxicam] Other (See Comments)    Ulcers in stomach eruption   . Diclofenac Other (See Comments)    Fast heart beat,, CP,SOB and weakness on one side.   Prior to Admission medications   Medication Sig Start Date End Date Taking? Authorizing Provider  calcium carbonate (OS-CAL) 600 MG TABS tablet Take 600 mg by mouth 2 (two) times daily with a meal.   Yes Historical Provider, MD  carvedilol (COREG) 3.125 MG tablet Take 1 tablet (3.125 mg total) by mouth 2 (two) times daily. 12/19/16 03/19/17 Yes Christopher End, MD  furosemide (LASIX) 20 MG tablet Take 1 tablet (20 mg total) by mouth daily as needed. Take for > 2lb weight gain in 1 day or > 5lb weight gain in 1 week. 12/19/16 03/19/17 Yes Christopher End, MD  glucose blood (ONE  TOUCH ULTRA TEST) test strip Check blood sugar once daily and as instructed. Dx E11.9 01/19/16  Yes Amy Cletis Athens, MD  Insulin Glargine (TOUJEO SOLOSTAR) 300 UNIT/ML SOPN Inject 10 Units into the skin daily. Patient taking differently: Inject 15 Units into the skin daily.  12/27/16  Yes Amy Cletis [place], MD  metFORMIN (GLUCOPHAGE-XR) 500 MG 24 hr tablet Take 4 tablets (2,000 mg total) by mouth daily with breakfast. Patient taking differently: Take 1,000 mg by mouth 2 (two) times daily.  12/11/16  Yes Amy Cletis Athens, MD  Multiple Vitamins-Minerals (MENS MULTI VITAMIN & MINERAL PO) Take 2 tablets by mouth daily. Men's Vita Fusion   Yes Historical Provider, MD  Omega-3 Fatty Acids (FISH OIL) 1200 MG CAPS Take 1 capsule  by mouth daily.   Yes Historical Provider, MD  omeprazole (PRILOSEC) 40 MG capsule Take 1 capsule (40 mg total) by mouth daily. 11/23/16 01/14/17 Yes Tonia Ghent, MD  sacubitril-valsartan (ENTRESTO) 24-26 MG Take 1 tablet by mouth 2 (two) times daily. 01/14/17  Yes Alisa Graff, FNP    Review of Systems  Constitutional: Positive for fatigue (minimal). Negative for appetite change and fever.  HENT: Positive for postnasal drip. Negative for congestion and sinus pressure.   Eyes: Negative.   Respiratory: Positive for cough and shortness of breath. Negative for chest tightness and wheezing.   Cardiovascular: Negative for chest pain, palpitations and leg swelling.  Gastrointestinal: Negative.  Negative for abdominal distention and abdominal pain.  Endocrine: Negative.   Genitourinary: Negative.   Musculoskeletal: Negative for back pain and neck pain.  Skin: Negative.   Allergic/Immunologic: Negative.   Neurological: Negative for dizziness and light-headedness.  Hematological: Negative for adenopathy. Does not bruise/bleed easily.  Psychiatric/Behavioral: Positive for sleep disturbance (sleeping on 2 pillows; getting up during the night due to coughing). Negative for dysphoric mood. The patient is not nervous/anxious.    Vitals:   02/15/17 0834  BP: (!) 136/92  Pulse: 92  Resp: 20  SpO2: 100%  Weight: 188 lb 6 oz (85.4 kg)  Height: 6\' 1"  (1.854 m)   Wt Readings from Last 3 Encounters:  02/15/17 188 lb 6 oz (85.4 kg)  01/29/17 185 lb 8 oz (84.1 kg)  01/14/17 183 lb 4.8 oz (83.1 kg)   Lab Results  Component Value Date   CREATININE 0.88 01/14/2017   CREATININE 0.87 12/06/2016   CREATININE 0.71 12/05/2016    Physical Exam  Constitutional: He is oriented to person, place, and time. He appears well-developed and well-nourished.  HENT:  Head: Normocephalic and atraumatic.  Neck: Normal range of motion. Neck supple. No JVD present.  Cardiovascular: Normal rate and regular rhythm.    Pulmonary/Chest: Effort normal. He has no wheezes. He has no rales.  Abdominal: Soft. He exhibits no distension. There is no tenderness.  Musculoskeletal: He exhibits no edema or tenderness.  Neurological: He is alert and oriented to person, place, and time.  Skin: Skin is warm and dry.  Psychiatric: He has a normal mood and affect. His behavior is normal. Thought content normal.  Nursing note and vitals reviewed.     Assessment & Plan:  1. Congestive heart failure with reduced ejection fraction - NYHA class II - euvolemic - Weighing daily and logging. Call for weight gain of > 2 lbs overnight or > 5 lbs in one week - not adding salt. Encouraged following < 2000 mg/day sodium intake. Reading food labels for sodium and carbs - missed cardiac rehab this week due  to cold; overall feels as if cardiac rehab is helping - tolerating Entresto 24/26 mg BID; will increase to Entresto 49/51 mg BID once he finishes his current dose on 02/21/17.  - draw BMP on 03/20/17 - saw cardiologist (End) 12/19/16 and returns 02/21/17   2. Diabetes - A1c on 11/27/16 was 9.6% - Checking daily  - No lows, discussed s/sx of lows - glucose levels much improved since beginning toujeo daily. Highest glucose 114 - Saw PCP on 12/27/16 ([facility])  3. HTN - BP looked ok today 139/92 - increasing entresto per above  Will see patient back in 1 month or sooner if needed. Encouraged patient to bring medication bottles to each visit.

## 2017-02-15 NOTE — Patient Instructions (Addendum)
Continue weighing daily and call for an overnight weight gain of > 2 pounds or a weekly weight gain of >5 pounds.  Will increase entresto to 49/51mg  twice daily. Finish current dose and then begin the 49/51mg  twice daily.

## 2017-02-19 ENCOUNTER — Encounter: Payer: Self-pay | Admitting: *Deleted

## 2017-02-19 ENCOUNTER — Telehealth: Payer: Self-pay | Admitting: *Deleted

## 2017-02-19 DIAGNOSIS — I5022 Chronic systolic (congestive) heart failure: Secondary | ICD-10-CM

## 2017-02-19 DIAGNOSIS — I213 ST elevation (STEMI) myocardial infarction of unspecified site: Secondary | ICD-10-CM

## 2017-02-19 NOTE — Telephone Encounter (Signed)
Called to check on status of return.  Philip Ponce called out last week with cold and yesterday for same reason.  Left message on machine.

## 2017-02-21 ENCOUNTER — Encounter: Payer: Self-pay | Admitting: Internal Medicine

## 2017-02-21 ENCOUNTER — Ambulatory Visit (INDEPENDENT_AMBULATORY_CARE_PROVIDER_SITE_OTHER): Payer: 59 | Admitting: Internal Medicine

## 2017-02-21 VITALS — BP 132/80 | HR 93 | Ht 73.0 in | Wt 189.5 lb

## 2017-02-21 DIAGNOSIS — I428 Other cardiomyopathies: Secondary | ICD-10-CM

## 2017-02-21 DIAGNOSIS — J069 Acute upper respiratory infection, unspecified: Secondary | ICD-10-CM | POA: Diagnosis not present

## 2017-02-21 DIAGNOSIS — I10 Essential (primary) hypertension: Secondary | ICD-10-CM

## 2017-02-21 DIAGNOSIS — I5022 Chronic systolic (congestive) heart failure: Secondary | ICD-10-CM | POA: Diagnosis not present

## 2017-02-21 MED ORDER — CARVEDILOL 6.25 MG PO TABS: 6.2500 mg | ORAL_TABLET | Freq: Two times a day (BID) | ORAL | 3 refills | Status: DC

## 2017-02-21 NOTE — Progress Notes (Signed)
Follow-up Outpatient Visit Date: 02/21/2017  Primary Care Provider: [name], MD [place] [id number]  Chief Complaint: Follow-up heart failure  HPI:  Philip Ponce is a 46 y.o. year-old male with history of chronic systolic heart failure secondary to nonischemic cardiomyopathy, hypertension, and type 2 diabetes mellitus, who presents for follow-up of heart failure. I last saw him on 12/19/16, which time he he had been doing well except for transient lightheadedness, dyspnea, and chest tightness after taking both lisinopril and Entresto. We discussed taking only one of these medications and agreed to restart low-dose Entresto. Today, Philip Ponce reports feeling well. He is tolerating Entresto and his other medications without significant side effects. He has noted occasional itching at night, but does not believe this is related to any of his medications. He denies shortness of breath, chest pain, palpitations, and lightheadedness. He does not have noticeable edema though he has gained about 7 pounds over the last 2 months. He attributes this to improving appetite. He has not needed to use his as needed furosemide since her last visit. He was seen by Darylene Price last week, at which time his Delene Loll was increased.   Philip Ponce is recovering from a recent upper respiratory tract infection, for which he has been using over-the-counter cold medication. He continues to do well with cardiac rehabilitation and hopes to return next week once he has gotten over his cold.  --------------------------------------------------------------------------------------------------  Cardiovascular History & Procedures: Cardiovascular Problems:  Chronic systolic heart failure secondary to nonischemic cardiomyopathy  Risk Factors:  Hypertension, diabetes mellitus, and male gender  Cath/PCI:  LHC (12/04/16): No angiographically significant CAD. Moderate to severe LV contractile  dysfunction (LVEF 30-35%) with mildly elevated left ventricular filling pressure.  LHC (09/28/13, [facility]): No significant CAD. LVEF 45%.  CV Surgery:  None  EP Procedures and Devices:  None  Non-Invasive Evaluation(s):  TTE (12/05/16): Moderately dilated left ventricle with LVEF of 20-25% and global hypokinesis. Grade 1 diastolic dysfunction. Mild MR. Mild left atrial enlargement. Normal RV size and function.  TTE (04/14/15): Normal LV size with mild LVH. LVEF 50-55% with normal wall motion. Grade 1 diastolic dysfunction. Mild left atrial enlargement. Normal RV size and function. No significant valvular abnormalities.  Recent CV Pertinent Labs: Lab Results  Component Value Date   CHOL 121 10/07/2015   HDL 66.70 10/07/2015   LDLCALC 39 10/07/2015   TRIG 78.0 10/07/2015   CHOLHDL 2 10/07/2015   INR 1.08 12/04/2016   INR 1.0 08/04/2012   K 4.1 01/14/2017   K 3.9 08/04/2012   MG 1.9 12/06/2016   MG 2.0 08/04/2012   BUN 18 01/14/2017   BUN 21 (H) 08/04/2012   CREATININE 0.88 01/14/2017   CREATININE 0.98 11/23/2016    Past medical and surgical history were reviewed and updated in EPIC.  Outpatient Encounter Prescriptions as of 02/21/2017  Medication Sig  . calcium carbonate (OS-CAL) 600 MG TABS tablet Take 600 mg by mouth 2 (two) times daily with a meal.  . carvedilol (COREG) 3.125 MG tablet Take 1 tablet (3.125 mg total) by mouth 2 (two) times daily.  . furosemide (LASIX) 20 MG tablet Take 1 tablet (20 mg total) by mouth daily as needed. Take for > 2lb weight gain in 1 day or > 5lb weight gain in 1 week.  . Garlic 191 MG TABS Take 100 mg by mouth daily.  Marland Kitchen glucose blood (ONE TOUCH ULTRA TEST) test strip Check blood sugar once daily and  as instructed. Dx E11.9  . Insulin Glargine (TOUJEO SOLOSTAR) 300 UNIT/ML SOPN Inject 10 Units into the skin daily. (Patient taking differently: Inject 15 Units into the skin daily. )  . metFORMIN (GLUCOPHAGE-XR) 500 MG 24 hr tablet Take 4 tablets  (2,000 mg total) by mouth daily with breakfast. (Patient taking differently: Take 1,000 mg by mouth 2 (two) times daily. )  . Multiple Vitamins-Minerals (MENS MULTI VITAMIN & MINERAL PO) Take 2 tablets by mouth daily. Men's Vita Fusion  . Omega-3 Fatty Acids (FISH OIL) 1200 MG CAPS Take 1 capsule by mouth daily.  Marland Kitchen omeprazole (PRILOSEC) 40 MG capsule Take 1 capsule (40 mg total) by mouth daily.  . pseudoephedrine-guaifenesin (TUSSIN PE) 30-100 MG/5ML SYRP Take 5 mLs by mouth every 4 (four) hours as needed.  . sacubitril-valsartan (ENTRESTO) 49-51 MG Take 1 tablet by mouth 2 (two) times daily.   No facility-administered encounter medications on file as of 02/21/2017.     Allergies: Mobic [meloxicam] and Diclofenac  Social History   Social History  . Marital status: Married    Spouse name: N/A  . Number of children: N/A  . Years of education: N/A   Occupational History  . 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 Smoking status: Never Smoker  . Smokeless tobacco: Never Used  . Alcohol use No  . Drug use: No  . Sexual activity: Yes   Other Topics Concern  . Not on file   Social History Narrative   Regular exercise-yes, walking one mile per day   Diet: fast food, diet soda, unsweeted tea    Family History  Problem Relation Age of Onset  . Diabetes Mother   . Hypertension Mother   . Cancer Mother        breast  . Diabetes Father   . Hypertension Father   . Cancer Father        colon  . Cancer Maternal Grandfather        prostate  . Stroke Maternal Grandfather        CVA    Review of Systems: A 12-system review of systems was performed and was negative except as noted in the HPI.  --------------------------------------------------------------------------------------------------  Physical Exam: BP 132/80 (BP Location: Left Arm, Patient Position: Sitting, Cuff Size: Normal)   Pulse 93   Ht 6\' 1"  (1.854 m)   Wt 189 lb 8  oz (86 kg)   BMI 25.00 kg/m   General:  Well-developed, well-nourished man, seated comfortably in the exam room. HEENT: No conjunctival pallor or scleral icterus.  Moist mucous membranes.  OP clear. Neck: Supple without lymphadenopathy, thyromegaly, JVD, or HJR. Lungs: Normal work of breathing.  Clear to auscultation bilaterally without wheezes or crackles. Heart: Regular rate and rhythm without murmurs, rubs, or gallops.  Non-displaced PMI. Abd: Bowel sounds present.  Soft, NT/ND without hepatosplenomegaly Ext: No lower extremity edema.  Radial, PT, and DP pulses are 2+ bilaterally. Skin: warm and dry without rash  EKG:  Normal sinus rhythm with rightward axis and left bundle branch block.  Lab Results  Component Value Date   WBC 4.2 01/29/2017   HGB 11.9 (L) 01/29/2017   HCT 36.0 (L) 01/29/2017   MCV 86.3 01/29/2017   PLT 233 01/29/2017    Lab Results  Component Value Date   NA 138 01/14/2017   K 4.1 01/14/2017   CL 106 01/14/2017   CO2 28 01/14/2017   BUN 18 01/14/2017  CREATININE 0.88 01/14/2017   GLUCOSE 135 (H) 01/14/2017   ALT 15 01/03/2016    Lab Results  Component Value Date   CHOL 121 10/07/2015   HDL 66.70 10/07/2015   LDLCALC 39 10/07/2015   TRIG 78.0 10/07/2015   CHOLHDL 2 10/07/2015   --------------------------------------------------------------------------------------------------  ASSESSMENT AND PLAN: Chronic systolic heart failure secondary to nonischemic cardiomyopathy Philip Ponce is doing well. He appears euvolemic though he has gained 7 pounds since our last visit in late March (he attributes this to increased appetite). He is well compensated with with NYHA class I-II symptoms. He is tolerating his current medication regimen well. We have discussed the importance of optimizing his heart failure regimen and have agreed to increase carvedilol to 6.25 mg twice a day. He should continue his current dose of Entresto. Once carvedilol and Entresto have  been optimized, we could consider the addition of an aldosterone antagonist if his EF remains reduced. He should continue to use furosemide as needed for edema or weight gain. We will repeat a limited echocardiogram to evaluate LV function next month. If his EF remains less than 35%, we will consider referral to EP to discuss ICD +/- CRT. However, I am hopeful that his EF is recovering with evidence based medical therapy.  Hypertension Blood pressure is borderline elevated today, given history of diabetes. We will increase carvedilol, as above.  URI Philip Ponce is recovering from this, though he continues to have occasional cough. I encouraged him to avoid cold medications with decongestant such as pseudoephedrine and phenylephrine, given his history of cardiac disease.  Follow-up: Return to clinic in 6 weeks.  [name], MD 02/21/2017 4:19 PM

## 2017-02-21 NOTE — Patient Instructions (Signed)
Medication Instructions:  Your physician has recommended you make the following change in your medication:  1- INCREASE Carvedilol 6.25 mg by mouth two times a day.   Labwork: none  Testing/Procedures: Your physician has requested that you have an LIMITED echocardiogram. Echocardiography is a painless test that uses sound waves to create images of your heart. It provides your doctor with information about the size and shape of your heart and how well your heart's chambers and valves are working. This procedure takes approximately one hour. There are no restrictions for this procedure. - AFTER June 15TH BUT BEFORE F/U APPT IN 6 WEEKS.   Follow-Up: Your physician recommends that you schedule a follow-up appointment in: [place].   If you need a refill on your cardiac medications before your next appointment, please call your pharmacy.  Echocardiogram An echocardiogram, or echocardiography, uses sound waves (ultrasound) to produce an image of your heart. The echocardiogram is simple, painless, obtained within a short period of time, and offers valuable information to your health care provider. The images from an echocardiogram can provide information such as:  Evidence of coronary artery disease (CAD).  Heart size.  Heart muscle function.  Heart valve function.  Aneurysm detection.  Evidence of a past heart attack.  Fluid buildup around the heart.  Heart muscle thickening.  Assess heart valve function.  Tell a health care provider about:  Any allergies you have.  All medicines you are taking, including vitamins, herbs, eye drops, creams, and over-the-counter medicines.  Any problems you or family members have had with anesthetic medicines.  Any blood disorders you have.  Any surgeries you have had.  Any medical conditions you have.  Whether you are pregnant or may be pregnant. What happens before the procedure? No special preparation is needed. Eat and  drink normally. What happens during the procedure?  In order to produce an image of your heart, gel will be applied to your chest and a wand-like tool (transducer) will be moved over your chest. The gel will help transmit the sound waves from the transducer. The sound waves will harmlessly bounce off your heart to allow the heart images to be captured in real-time motion. These images will then be recorded.  You may need an IV to receive a medicine that improves the quality of the pictures. What happens after the procedure? You may return to your normal schedule including diet, activities, and medicines, unless your health care provider tells you otherwise. This information is not intended to replace advice given to you by your health care provider. Make sure you discuss any questions you have with your health care provider. Document Released: 09/07/2000 Document Revised: 04/28/2016 Document Reviewed: 05/18/2013 Elsevier Interactive Patient Education  2017 [employer].

## 2017-02-26 ENCOUNTER — Encounter: Payer: 59 | Attending: Internal Medicine | Admitting: Respiratory Therapy

## 2017-02-26 DIAGNOSIS — I5022 Chronic systolic (congestive) heart failure: Secondary | ICD-10-CM

## 2017-02-26 DIAGNOSIS — I213 ST elevation (STEMI) myocardial infarction of unspecified site: Secondary | ICD-10-CM

## 2017-02-26 DIAGNOSIS — I10 Essential (primary) hypertension: Secondary | ICD-10-CM | POA: Insufficient documentation

## 2017-02-26 NOTE — Progress Notes (Signed)
Daily Session Note  Patient Details  Name: Philip Ponce MRN: [id number] Date of Birth: Dec 25, 1970 Referring Provider:     Cardiac Rehab from 01/14/2017 in [facility] Cardiac and Pulmonary Rehab  Referring Provider  End, Harrell Gave MD      Encounter Date: 02/26/2017  Check In:     Session Check In - 02/26/17 0951      Check-In   Medication changes reported     Yes   Comments [name] and Carvdiolo increased         History  Smoking Status   Never Smoker  Smokeless Tobacco   Never Used    Goals Met:  Proper associated with RPD/PD & O2 Sat Independence with exercise equipment Exercise tolerated well No report of cardiac concerns or symptoms Strength training completed today  Goals Unmet:  Not Applicable  Comments: Pt able to follow exercise prescription today without complaint.  Will continue to monitor for progression.   Dr. Emily Filbert is Medical Director for [place] and [facility].

## 2017-03-05 ENCOUNTER — Encounter: Payer: 59 | Admitting: Respiratory Therapy

## 2017-03-05 DIAGNOSIS — I5022 Chronic systolic (congestive) heart failure: Secondary | ICD-10-CM | POA: Diagnosis not present

## 2017-03-05 DIAGNOSIS — I213 ST elevation (STEMI) myocardial infarction of unspecified site: Secondary | ICD-10-CM

## 2017-03-05 NOTE — Progress Notes (Signed)
Daily Session Note  Patient Details  Name: Philip Ponce MRN: [id number] Date of Birth: 19-Apr-1971 Referring Provider:     Cardiac Rehab from 01/14/2017 in [facility] Cardiac and Pulmonary Rehab  Referring Provider  End, Harrell Gave MD      Encounter Date: 03/05/2017  Check In:     Session Check In - 03/05/17 0839      Check-In   Location [facility]-Cardiac & Pulmonary Rehab   Staff Present Alberteen Sam, MA, ACSM RCEP, Exercise Physiologist;Susanne Bice, RN, BSN, CCRP;Laureen Owens Shark, BS, RRT, Respiratory Therapist;Other  Roanna Epley RN, Darcella Gasman RRT   Supervising physician immediately available to respond to emergencies See telemetry face sheet for immediately available ER MD   Medication changes reported     No   Fall or balance concerns reported    No   Warm-up and Cool-down Performed on first and last piece of equipment   Resistance Training Performed Yes   VAD Patient? No     Pain Assessment   Currently in Pain? No/denies   Multiple Pain Sites No         History  Smoking Status  . Never Smoker  Smokeless Tobacco  . Never Used    Goals Met:  Exercise tolerated well No report of cardiac concerns or symptoms Strength training completed today  Goals Unmet:  Not Applicable  Comments: Pt able to follow exercise prescription today without complaint.  Will continue to monitor for progression.    Dr. Emily Filbert is Medical Director for [place] and [facility].

## 2017-03-07 ENCOUNTER — Encounter: Payer: 59 | Admitting: *Deleted

## 2017-03-07 DIAGNOSIS — I213 ST elevation (STEMI) myocardial infarction of unspecified site: Secondary | ICD-10-CM

## 2017-03-07 DIAGNOSIS — I5022 Chronic systolic (congestive) heart failure: Secondary | ICD-10-CM

## 2017-03-07 NOTE — Progress Notes (Signed)
Daily Session Note  Patient Details  Name: Philip Ponce MRN: [id number] Date of Birth: 1971/09/13 Referring Provider:     Cardiac Rehab from 01/14/2017 in [facility]  Referring Provider  End, Harrell Gave MD      Encounter Date: 03/07/2017  Check In:     Session Check In - 03/07/17 0846      Check-In   Location [facility]   Staff Present Nada Maclachlan, BA, ACSM CEP, Exercise Physiologist;Prudence Heiny Luan Pulling, MA, ACSM RCEP, Exercise Physiologist;Meredith Sherryll Burger, RN BSN   Supervising physician immediately available to respond to emergencies See telemetry face sheet for immediately available ER MD   Medication changes reported     No   Fall or balance concerns reported    No   Warm-up and Cool-down Performed on first and last piece of equipment   Resistance Training Performed Yes   VAD Patient? No     Pain Assessment   Currently in Pain? No/denies   Multiple Pain Sites No           Exercise Prescription Changes - 03/07/17 1100      Response to Exercise   Blood Pressure (Admit) 110/60   Blood Pressure (Exercise) 126/62   Blood Pressure (Exit) 124/64   Heart Rate (Admit) 99 bpm   Heart Rate (Exercise) 135 bpm   Heart Rate (Exit) 77 bpm   Rating of Perceived Exertion (Exercise) 14   Symptoms none   Duration Progress to 45 minutes of aerobic exercise without signs/symptoms of physical distress   Intensity THRR unchanged     Progression   Progression Continue to progress workloads to maintain intensity without signs/symptoms of physical distress.   Average METs 3.6     Resistance Training   Training Prescription Yes   Weight 5 lbs   Reps 10-15     Interval Training   Interval Training No     Treadmill   MPH 3.3   Grade 3   Minutes 15   METs 4.89     Elliptical   Level 1   Speed 4.5   Minutes 15     T5 Nustep   Level 5   Minutes 15   METs 2.3     Home Exercise Plan   Plans to continue exercise at [employer] (comment)  walking and [employer] by [employer]   Frequency Add 3 additional days to program exercise sessions.   Initial Home Exercises Provided 02/07/17      History  Smoking Status  . Never Smoker  Smokeless Tobacco  . Never Used    Goals Met:  Independence with exercise equipment Exercise tolerated well No report of cardiac concerns or symptoms Strength training completed today  Goals Unmet:  Not Applicable  Comments: Pt able to follow exercise prescription today without complaint.  Will continue to monitor for progression.    Dr. Emily Filbert is Medical Director for [facility].

## 2017-03-12 ENCOUNTER — Encounter: Payer: 59 | Admitting: Respiratory Therapy

## 2017-03-12 DIAGNOSIS — I213 ST elevation (STEMI) myocardial infarction of unspecified site: Secondary | ICD-10-CM

## 2017-03-12 DIAGNOSIS — I5022 Chronic systolic (congestive) heart failure: Secondary | ICD-10-CM

## 2017-03-12 NOTE — Progress Notes (Signed)
Daily Session Note  Patient Details  Name: Philip Ponce MRN: [id number] Date of Birth: 12-13-1970 Referring Provider:     Cardiac Rehab from 01/14/2017 in [facility]  Referring Provider  End, Harrell Gave MD      Encounter Date: 03/12/2017  Check In:     Session Check In - 03/12/17 0828      Check-In   Location [facility]   Staff Present Heath Lark, RN, BSN, CCRP;Laureen Owens Shark, BS, RRT, Respiratory Therapist;Joseph Darrin Nipper, [place], ACSM RCEP, Exercise Physiologist   Supervising physician immediately available to respond to emergencies See telemetry face sheet for immediately available ER MD   Medication changes reported     No   Fall or balance concerns reported    No   Warm-up and Cool-down Performed on first and last piece of equipment   Resistance Training Performed Yes   VAD Patient? No     Pain Assessment   Currently in Pain? No/denies   Multiple Pain Sites No         History  Smoking Status   Never Smoker  Smokeless Tobacco   Never Used    Goals Met:  Proper associated with RPD/PD & O2 Sat Independence with exercise equipment Exercise tolerated well No report of cardiac concerns or symptoms Strength training completed today  Goals Unmet:  Not Applicable  Comments: Pt able to follow exercise prescription today without complaint.  Will continue to monitor for progression.   Dr. Emily Filbert is Medical Director for [facility].

## 2017-03-13 ENCOUNTER — Encounter: Payer: Self-pay | Admitting: *Deleted

## 2017-03-13 DIAGNOSIS — I213 ST elevation (STEMI) myocardial infarction of unspecified site: Secondary | ICD-10-CM

## 2017-03-13 DIAGNOSIS — I5022 Chronic systolic (congestive) heart failure: Secondary | ICD-10-CM

## 2017-03-13 NOTE — Progress Notes (Signed)
Cardiac Individual Treatment Plan  Patient Details  Name: Philip Ponce MRN: [id number] Date of Birth: Oct 28, 1970 Referring Provider:     Cardiac Rehab from 01/14/2017 in [facility] Cardiac and Pulmonary Rehab  Referring Provider  End, Harrell Gave MD      Initial Encounter Date:    Cardiac Rehab from 01/14/2017 in [facility] Cardiac and Pulmonary Rehab  Date  01/14/17  Referring Provider  End, Harrell Gave MD      Visit Diagnosis: ST elevation myocardial infarction (STEMI), unspecified artery ([place])  Heart failure, chronic systolic ([place])  Patient's Home Medications on Admission:  Current Outpatient Prescriptions:  .  calcium carbonate (OS-CAL) 600 MG TABS tablet, Take 600 mg by mouth 2 (two) times daily with a meal., Disp: , Rfl:  .  carvedilol (COREG) 6.25 MG tablet, Take 1 tablet (6.25 mg total) by mouth 2 (two) times daily., Disp: 180 tablet, Rfl: 3 .  furosemide (LASIX) 20 MG tablet, Take 1 tablet (20 mg total) by mouth daily as needed. Take for > 2lb weight gain in 1 day or > 5lb weight gain in 1 week., Disp: 90 tablet, Rfl: 3 .  Garlic 025 MG TABS, Take 100 mg by mouth daily., Disp: , Rfl:  .  glucose blood (ONE TOUCH ULTRA TEST) test strip, Check blood sugar once daily and as instructed. Dx E11.9, Disp: 100 each, Rfl: 1 .  Insulin Glargine (TOUJEO SOLOSTAR) 300 UNIT/ML SOPN, Inject 10 Units into the skin daily. (Patient taking differently: Inject 15 Units into the skin daily. ), Disp: 1.5 mL, Rfl: 11 .  metFORMIN (GLUCOPHAGE-XR) 500 MG 24 hr tablet, Take 4 tablets (2,000 mg total) by mouth daily with breakfast. (Patient taking differently: Take 1,000 mg by mouth 2 (two) times daily. ), Disp: 120 tablet, Rfl: 11 .  Multiple Vitamins-Minerals (MENS MULTI VITAMIN & MINERAL PO), Take 2 tablets by mouth daily. Men's Vita Fusion, Disp: , Rfl:  .  Omega-3 Fatty Acids (FISH OIL) 1200 MG CAPS, Take 1 capsule by mouth daily., Disp: , Rfl:  .  omeprazole (PRILOSEC) 40 MG capsule, Take 1 capsule (40  mg total) by mouth daily., Disp: 30 capsule, Rfl: 0 .  pseudoephedrine-guaifenesin (TUSSIN PE) 30-100 MG/5ML SYRP, Take 5 mLs by mouth every 4 (four) hours as needed., Disp: , Rfl:  .  sacubitril-valsartan (ENTRESTO) 49-51 MG, Take 1 tablet by mouth 2 (two) times daily., Disp: 60 tablet, Rfl: 5  Past Medical History: Past Medical History:  Diagnosis Date  . Acute blood loss anemia 11/20/2016  . CHF (congestive heart failure) ([place])   . Coronary artery disease   . Diabetes mellitus type I ([place])   . Slipped intervertebral disc    L4 L5  . STEMI (ST elevation myocardial infarction) ([place])     Tobacco Use: History  Smoking Status  . Never Smoker  Smokeless Tobacco  . Never Used    Labs: Recent Review Flowsheet Data    Labs for ITP Cardiac and Pulmonary Rehab Latest Ref Rng & Units 01/01/2013 04/15/2015 10/07/2015 01/03/2016 11/27/2016   Cholestrol 0 - 200 mg/dL - 101 121 - -   LDLCALC 0 - 99 mg/dL - 38 39 - -   HDL >39.00 mg/dL - 51.40 66.70 - -   Trlycerides 0.0 - 149.0 mg/dL - 58.0 78.0 - -   Hemoglobin A1c 4.6 - 6.5 % 8.6(H) 7.0(H) 10.5(H) 10.6(H) 9.6(H)       Exercise Target Goals:    Exercise Program Goal: Individual exercise prescription set with THRR, safety &  activity barriers. Participant demonstrates ability to understand and report RPE using BORG scale, to self-measure pulse accurately, and to acknowledge the importance of the exercise prescription.  Exercise Prescription Goal: Starting with aerobic activity 30 plus minutes a day, 3 days per week for initial exercise prescription. Provide home exercise prescription and guidelines that participant acknowledges understanding prior to discharge.  Activity Barriers & Risk Stratification:     Activity Barriers & Cardiac Risk Stratification - 01/14/17 1311      Activity Barriers & Cardiac Risk Stratification   Activity Barriers Balance Concerns;Decreased Ventricular Function;Deconditioning;Muscular Weakness  he said that  sometimes his equilibrium gets off   Cardiac Risk Stratification High      6 Minute Walk:     6 Minute Walk    Row Name 01/14/17 1437         6 Minute Walk   Phase Initial     Distance 1570 feet     Walk Time 6 minutes     # of Rest Breaks 0     MPH 2.97     METS 5.17     RPE 9     VO2 Peak 18.11     Symptoms No     Resting HR 75 bpm     Resting BP 138/70     Max Ex. HR 112 bpm     Max Ex. BP 146/74     2 Minute Post BP 134/70        Oxygen Initial Assessment:   Oxygen Re-Evaluation:   Oxygen Discharge (Final Oxygen Re-Evaluation):   Initial Exercise Prescription:     Initial Exercise Prescription - 01/14/17 1400      Date of Initial Exercise RX and Referring Provider   Date 01/14/17   Referring Provider End, Harrell Gave MD     Treadmill   MPH 3   Grade 0.5   Minutes 15   METs 3.5     Elliptical   Level 1   Speed 4.5   Minutes 15     T5 Nustep   Level 3   SPM 80   Minutes 15   METs 2     Prescription Details   Frequency (times per week) 2   Duration Progress to 45 minutes of aerobic exercise without signs/symptoms of physical distress     Intensity   THRR 40-80% of Max Heartrate 114-153   Ratings of Perceived Exertion 11-13   Perceived Dyspnea 0-4     Progression   Progression Continue to progress workloads to maintain intensity without signs/symptoms of physical distress.     Resistance Training   Training Prescription Yes   Weight 4 lbs   Reps 10-15      Perform Capillary Blood Glucose checks as needed.  Exercise Prescription Changes:     Exercise Prescription Changes    Row Name 01/14/17 1400 02/06/17 1100 02/07/17 1000 02/21/17 1500 03/07/17 1100     Response to Exercise   Blood Pressure (Admit) 138/70 142/64  - 126/64 110/60   Blood Pressure (Exercise) 146/74 144/76  - 138/74 126/62   Blood Pressure (Exit) 134/70 124/64  - 118/62 124/64   Heart Rate (Admit) 75 bpm 99 bpm  - 99 bpm 99 bpm   Heart Rate (Exercise) 112  bpm 151 bpm  - 120 bpm 135 bpm   Heart Rate (Exit) 89 bpm 88 bpm  - 88 bpm 77 bpm   Oxygen Saturation (Admit) 100 %  -  -  -  -  Oxygen Saturation (Exercise) 100 %  -  -  -  -   Rating of Perceived Exertion (Exercise) 9 13  - 11 14   Symptoms none none none none none   Comments walk test results  -  -  -  -   Duration  - Progress to 45 minutes of aerobic exercise without signs/symptoms of physical distress Progress to 45 minutes of aerobic exercise without signs/symptoms of physical distress Progress to 45 minutes of aerobic exercise without signs/symptoms of physical distress Progress to 45 minutes of aerobic exercise without signs/symptoms of physical distress   Intensity  - THRR unchanged THRR unchanged THRR unchanged THRR unchanged     Progression   Progression  - Continue to progress workloads to maintain intensity without signs/symptoms of physical distress. Continue to progress workloads to maintain intensity without signs/symptoms of physical distress. Continue to progress workloads to maintain intensity without signs/symptoms of physical distress. Continue to progress workloads to maintain intensity without signs/symptoms of physical distress.   Average METs  - 3.4 3.4 3.48 3.6     Resistance Training   Training Prescription  - Yes Yes Yes Yes   Weight  - 4 lbs 4 lbs 4 lbs 5 lbs   Reps  - 10-15 10-15 10-15 10-15     Interval Training   Interval Training  -  -  - No No     Treadmill   MPH  - 3 3 3.3 3.3   Grade  - 0.5 0.5 0.5 3   Minutes  - 15 15 15 15    METs  - 3.5 3.5 3.75 4.89     Elliptical   Level  - 1 1 1 1    Speed  - 4.5 4.5 4.5 4.5   Minutes  - 15 15 15 15      T5 Nustep   Level  - 3 3 3 5    Minutes  - 15 15 15 15    METs  - 3.3 3.3 3.2 2.3     Home Exercise Plan   Plans to continue exercise at  -  - [employer] (comment)  walking and Body by [employer] (comment)  walking and [employer] by [employer] (comment)  walking and Body by  [employer]  -  - Add 3 additional days to program exercise sessions. Add 3 additional days to program exercise sessions. Add 3 additional days to program exercise sessions.   Initial Home Exercises Provided  -  - 02/07/17 02/07/17 02/07/17      Exercise Comments:     Exercise Comments    Row Name 01/24/17 1049           Exercise Comments First full day of exercise!  Patient was oriented to gym and equipment including functions, settings, policies, and procedures.  Patient's individual exercise prescription and treatment plan were reviewed.  All starting workloads were established based on the results of the 6 minute walk test done at initial orientation visit.  The plan for exercise progression was also introduced and progression will be customized based on patient's performance and goals.          Exercise Goals and Review:     Exercise Goals    Row Name 01/14/17 1439             Exercise Goals   Increase Physical Activity Yes       Intervention Develop an individualized exercise prescription for aerobic and resistive training based  on initial evaluation findings, risk stratification, comorbidities and participant's personal goals.;Provide advice, education, support and counseling about physical activity/exercise needs.       Expected Outcomes Achievement of increased cardiorespiratory fitness and enhanced flexibility, muscular endurance and strength shown through measurements of functional capacity and personal statement of participant.       Increase Strength and Stamina Yes       Intervention Provide advice, education, support and counseling about physical activity/exercise needs.;Develop an individualized exercise prescription for aerobic and resistive training based on initial evaluation findings, risk stratification, comorbidities and participant's personal goals.       Expected Outcomes Achievement of increased cardiorespiratory fitness and enhanced flexibility,  muscular endurance and strength shown through measurements of functional capacity and personal statement of participant.          Exercise Goals Re-Evaluation :     Exercise Goals Re-Evaluation    Row Name 02/06/17 1058 02/07/17 1010 02/21/17 1537 03/05/17 1022       Exercise Goal Re-Evaluation   Exercise Goals Review Increase Physical Activity;Increase Strenth and Stamina Increase Physical Activity;Increase Strenth and Stamina Increase Physical Activity;Increase Strenth and Stamina Increase Physical Activity;Increase Strenth and Stamina    Comments Channin is off to a good start with rehab.  He is doing well and has completed two full sessions.  We will continue to monitor his progression. Reviewed home exercise with pt today.  Pt plans to walk and go to gym ([employer]) for exercise.  Reviewed THR, pulse, RPE, sign and symptoms, and when to call 911 or MD.  Also discussed weather considerations and indoor options.  Pt voiced understanding. Menelik has been out sick since 02/07/17.  He had been doing well and hopes to return and be able to pick up where he left off. We will continue to monitor him when he is able to return. Maximos has been doing well in rehab.  He is feeling better and much stronger.  He is now able to do more at work, almost back to normal!!  He has not been going to the gym as his work schedule has changed and his daughter is now home from school.  He has noticed how well he has progressed.      Expected Outcomes Short: Start to increase workloads.  Long: Continue to work on IT sales professional. ShortL Start to add more exercise in on off days.  Long: Make exercise part of routine. Short: Able to come to class regularly again.  Long: Work on IT sales professional. Short:Try to figure out schedule to allow for home exercise.  Long: Continue to exercise long term.       Discharge Exercise Prescription (Final Exercise Prescription Changes):     Exercise Prescription Changes -  03/07/17 1100      Response to Exercise   Blood Pressure (Admit) 110/60   Blood Pressure (Exercise) 126/62   Blood Pressure (Exit) 124/64   Heart Rate (Admit) 99 bpm   Heart Rate (Exercise) 135 bpm   Heart Rate (Exit) 77 bpm   Rating of Perceived Exertion (Exercise) 14   Symptoms none   Duration Progress to 45 minutes of aerobic exercise without signs/symptoms of physical distress   Intensity THRR unchanged     Progression   Progression Continue to progress workloads to maintain intensity without signs/symptoms of physical distress.   Average METs 3.6     Resistance Training   Training Prescription Yes   Weight 5 lbs   Reps 10-15  Interval Training   Interval Training No     Treadmill   MPH 3.3   Grade 3   Minutes 15   METs 4.89     Elliptical   Level 1   Speed 4.5   Minutes 15     T5 Nustep   Level 5   Minutes 15   METs 2.3     Home Exercise Plan   Plans to continue exercise at [employer] (comment)  walking and [employer]   Frequency Add 3 additional days to program exercise sessions.   Initial Home Exercises Provided 02/07/17      Nutrition:  Target Goals: Understanding of nutrition guidelines, daily intake of sodium 1500mg , cholesterol 200mg , calories 30% from fat and 7% or less from saturated fats, daily to have 5 or more servings of fruits and vegetables.  Biometrics:     Pre Biometrics - 01/14/17 1440      Pre Biometrics   Height 6' 0.8" (1.849 m)   Weight 183 lb 4.8 oz (83.1 kg)   Waist Circumference 30 inches   Hip Circumference 37.5 inches   Waist to Hip Ratio 0.8 %   BMI (Calculated) 24.4   Single Leg Stand 1.98 seconds       Nutrition Therapy Plan and Nutrition Goals:     Nutrition Therapy & Goals - 03/05/17 1100      Nutrition Therapy   RD appointment defered Yes      Nutrition Discharge: Rate Your Plate Scores:     Nutrition Assessments - 01/14/17 1311      MEDFICTS Scores   Pre Score 48       Nutrition Goals Re-Evaluation:     Nutrition Goals Re-Evaluation    Row Name 03/05/17 1100             Goals   Current Weight 191 lb (86.6 kg)       Comment Lorance declined a nutrtion appointment.  He continues to follow the diet set by his bariatrtic surgery.       Expected Outcome Continue to follow diet.          Nutrition Goals Discharge (Final Nutrition Goals Re-Evaluation):     Nutrition Goals Re-Evaluation - 03/05/17 1100      Goals   Current Weight 191 lb (86.6 kg)   Comment Steadman declined a nutrtion appointment.  He continues to follow the diet set by his bariatrtic surgery.   Expected Outcome Continue to follow diet.      Psychosocial: Target Goals: Acknowledge presence or absence of significant depression and/or stress, maximize coping skills, provide positive support system. Participant is able to verbalize types and ability to use techniques and skills needed for reducing stress and depression.   Initial Review & Psychosocial Screening:     Initial Psych Review & Screening - 01/14/17 1303      Initial Review   Current issues with Current Stress Concerns   Source of Stress Concerns Family   Comments Quetin's Mother has been in the [facility] after being a "cancer survivor" and taking a test to make sure the cancer was gone but she had "kidney problems so the dye burned alot of her insides".      Family Dynamics   Good Support System? Yes     Barriers   Psychosocial barriers to participate in program The patient should benefit from training in stress management and relaxation.     Screening Interventions   Interventions Encouraged  to exercise;To provide support and resources with identified psychosocial needs      Quality of Life Scores:      Quality of Life - 01/14/17 1309      Quality of Life Scores   Health/Function Pre 30 %   Socioeconomic Pre 30 %   Psych/Spiritual Pre 30 %   Family Pre 30 %   GLOBAL Pre 30 %       PHQ-9: Recent Review Flowsheet Data    Depression screen [facility] 2/9 02/15/2017 01/14/2017 12/17/2016   Decreased Interest 0 0 0   Down, Depressed, Hopeless 0 0 0   PHQ - 2 Score 0 0 0   Altered sleeping - 0 -   Tired, decreased energy - 0 -   Change in appetite - 0 -   Feeling bad or failure about yourself  - 0 -   Trouble concentrating - 0 -   Moving slowly or fidgety/restless - 0 -   Suicidal thoughts - 0 -   Difficult doing work/chores - Not difficult at all -     Interpretation of Total Score  Total Score Depression Severity:  1-4 = Minimal depression, 5-9 = Mild depression, 10-14 = Moderate depression, 15-19 = Moderately severe depression, 20-27 = Severe depression   Psychosocial Evaluation and Intervention:   Psychosocial Re-Evaluation:     Psychosocial Re-Evaluation    [place] Name 03/05/17 1106             Psychosocial Re-Evaluation   Current issues with Current Stress Concerns       Comments Jaxden has been out on days that councelor has been present.  He is doing well.  He has a strong support system between his wife and kids.  He is almost back to normal at work.  His mom has been transferred to [place] and should be going home soon.  That has been his biggest stress and he is excited about her recovery.  He is just letting things slide off his shoulders and not worring as much.   He has been sleeping good when he is able to sleep.  Currently just trying to figure out his schedule to allow for exercise and work.  He now has his daugthter home with him for the summer.       Expected Outcomes Short: Continue to work on staying [place] about his mom.  Long: Positive overall.          Psychosocial Discharge (Final Psychosocial Re-Evaluation):     Psychosocial Re-Evaluation - 03/05/17 1106      Psychosocial Re-Evaluation   Current issues with Current Stress Concerns   Comments Lovis has been out on days that councelor has been present.  He is doing well.  He has a  strong support system between his wife and kids.  He is almost back to normal at work.  His mom has been transferred to [place] and should be going home soon.  That has been his biggest stress and he is excited about her recovery.  He is just letting things slide off his shoulders and not worring as much.   He has been sleeping good when he is able to sleep.  Currently just trying to figure out his schedule to allow for exercise and work.  He now has his daugthter home with him for the summer.   Expected Outcomes Short: Continue to work on staying [place] about his mom.  Long: Positive overall.      Vocational Rehabilitation: Provide  vocational rehab assistance to qualifying candidates.   Vocational Rehab Evaluation & Intervention:     Vocational Rehab - 01/14/17 1302      Initial Vocational Rehab Evaluation & Intervention   Assessment shows need for Vocational Rehabilitation No      Education: Education Goals: Education classes will be provided on a weekly basis, covering required topics. Participant will state understanding/return demonstration of topics presented.  Learning Barriers/Preferences:     Learning Barriers/Preferences - 01/14/17 1301      Learning Barriers/Preferences   Learning Barriers None   Learning Preferences Skilled Demonstration      Education Topics: General Nutrition Guidelines/Fats and Fiber: -Group instruction provided by verbal, written material, models and posters to present the general guidelines for heart healthy nutrition. Gives an explanation and review of dietary fats and fiber.   Controlling Sodium/Reading Food Labels: -Group verbal and written material supporting the discussion of sodium use in heart healthy nutrition. Review and explanation with models, verbal and written materials for utilization of the food label.   Cardiac Rehab from 03/12/2017 in [facility] Cardiac and Pulmonary Rehab  Date  01/29/17  Educator  PI  Instruction Review Code  2-  meets goals/outcomes      Exercise Physiology & Risk Factors: - Group verbal and written instruction with models to review the exercise physiology of the cardiovascular system and associated critical values. Details cardiovascular disease risk factors and the goals associated with each risk factor.   Aerobic Exercise & Resistance Training: - Gives group verbal and written discussion on the health impact of inactivity. On the components of aerobic and resistive training programs and the benefits of this training and how to safely progress through these programs.   Cardiac Rehab from 03/12/2017 in [facility] Cardiac and Pulmonary Rehab  Date  02/07/17  Educator  [facility]  Instruction Review Code  2- meets goals/outcomes      Flexibility, Balance, General Exercise Guidelines: - Provides group verbal and written instruction on the benefits of flexibility and balance training programs. Provides general exercise guidelines with specific guidelines to those with heart or lung disease. Demonstration and skill practice provided.   Stress Management: - Provides group verbal and written instruction about the health risks of elevated stress, cause of high stress, and healthy ways to reduce stress.   Depression: - Provides group verbal and written instruction on the correlation between heart/lung disease and depressed mood, treatment options, and the stigmas associated with seeking treatment.   Cardiac Rehab from 03/12/2017 in [facility] Cardiac and Pulmonary Rehab  Date  01/31/17  Educator  [facility]  Instruction Review Code  2- meets goals/outcomes      Anatomy & Physiology of the Heart: - Group verbal and written instruction and models provide basic cardiac anatomy and physiology, with the coronary electrical and arterial systems. Review of: AMI, Angina, Valve disease, Heart Failure, Cardiac Arrhythmia, Pacemakers, and the ICD.   Cardiac Rehab from 03/12/2017 in [facility] Cardiac and Pulmonary Rehab  Date  01/24/17   Educator  [place]  Instruction Review Code  2- meets goals/outcomes      Cardiac Procedures: - Group verbal and written instruction and models to describe the testing methods done to diagnose heart disease. Reviews the outcomes of the test results. Describes the treatment choices: Medical Management, Angioplasty, or Coronary Bypass Surgery.   Cardiac Rehab from 03/12/2017 in [facility] Cardiac and Pulmonary Rehab  Date  02/26/17  Educator  SB  Instruction Review Code  2- meets goals/outcomes  Cardiac Medications: - Group verbal and written instruction to review commonly prescribed medications for heart disease. Reviews the medication, class of the drug, and side effects. Includes the steps to properly store meds and maintain the prescription regimen.   Cardiac Rehab from 03/12/2017 in [facility] Cardiac and Pulmonary Rehab  Date  03/05/17 [6/12 part 1 6/14 Part Two]  Educator  sb  Instruction Review Code  2- meets goals/outcomes      Go Sex-Intimacy & Heart Disease, Get SMART - Goal Setting: - Group verbal and written instruction through game format to discuss heart disease and the return to sexual intimacy. Provides group verbal and written material to discuss and apply goal setting through the application of the S.M.A.R.T. Method.   Cardiac Rehab from 03/12/2017 in [facility] Cardiac and Pulmonary Rehab  Date  02/26/17  Educator  SB  Instruction Review Code  2- meets goals/outcomes      Other Matters of the Heart: - Provides group verbal, written materials and models to describe Heart Failure, Angina, Valve Disease, and Diabetes in the realm of heart disease. Includes description of the disease process and treatment options available to the cardiac patient.   Cardiac Rehab from 03/12/2017 in [facility] Cardiac and Pulmonary Rehab  Date  01/24/17  Educator  [place]  Instruction Review Code  2- meets goals/outcomes      Exercise & Equipment Safety: - Individual verbal instruction and demonstration of  equipment use and safety with use of the equipment.   Cardiac Rehab from 03/12/2017 in [facility] Cardiac and Pulmonary Rehab  Date  01/14/17  Educator  C. Enterkin,RN  Instruction Review Code  1- partially meets, needs review/practice      Infection Prevention: - Provides verbal and written material to individual with discussion of infection control including proper hand washing and proper equipment cleaning during exercise session.   Cardiac Rehab from 03/12/2017 in [facility] Cardiac and Pulmonary Rehab  Date  01/14/17  Educator  C. Enterkin, RN  Instruction Review Code  2- meets goals/outcomes      Falls Prevention: - Provides verbal and written material to individual with discussion of falls prevention and safety.   Cardiac Rehab from 03/12/2017 in [facility] Cardiac and Pulmonary Rehab  Date  01/14/17  Educator  C. ENterkinRN  Instruction Review Code  1- partially meets, needs review/practice      Diabetes: - Individual verbal and written instruction to review signs/symptoms of diabetes, desired ranges of glucose level fasting, after meals and with exercise. Advice that pre and post exercise glucose checks will be done for 3 sessions at entry of program.   Cardiac Rehab from 03/12/2017 in [facility] Cardiac and Pulmonary Rehab  Date  01/14/17  Educator  C. [place]  Instruction Review Code  1- partially meets, needs review/practice       Knowledge Questionnaire Score:     Knowledge Questionnaire Score - 01/14/17 1310      Knowledge Questionnaire Score   Pre Score 10/28      Core Components/Risk Factors/Patient Goals at Admission:     Personal Goals and Risk Factors at Admission - 01/14/17 1311      Core Components/Risk Factors/Patient Goals on Admission    Weight Management Yes;Weight Maintenance   Intervention Weight Management: Develop a combined nutrition and exercise program designed to reach desired caloric intake, while maintaining appropriate intake of nutrient and fiber,  sodium and fats, and appropriate energy expenditure required for the weight goal.;Weight Management: Provide education and appropriate resources to help participant work  on and attain dietary goals.   Admit Weight 183 lb 4.8 oz (83.1 kg)   Expected Outcomes Short Term: Continue to assess and modify interventions until short term weight is achieved;Long Term: Adherence to nutrition and physical activity/exercise program aimed toward attainment of established weight goal;Weight Maintenance: Understanding of the daily nutrition guidelines, which includes 25-35% calories from fat, 7% or less cal from saturated fats, less than 200mg  cholesterol, less than 1.5gm of sodium, & 5 or more servings of fruits and vegetables daily   Diabetes Yes   Intervention Provide education about signs/symptoms and action to take for hypo/hyperglycemia.;Provide education about proper nutrition, including hydration, and aerobic/resistive exercise prescription along with prescribed medications to achieve blood glucose in normal ranges: Fasting glucose 65-99 mg/dL   Expected Outcomes Short Term: Participant verbalizes understanding of the signs/symptoms and immediate care of hyper/hypoglycemia, proper foot care and importance of medication, aerobic/resistive exercise and nutrition plan for blood glucose control.;Long Term: Attainment of HbA1C < 7%.   Heart Failure Yes   Intervention Provide a combined exercise and nutrition program that is supplemented with education, support and counseling about heart failure. Directed toward relieving symptoms such as shortness of breath, decreased exercise tolerance, and extremity edema.   Expected Outcomes Improve functional capacity of life;Short term: Attendance in program 2-3 days a week with increased exercise capacity. Reported lower sodium intake. Reported increased fruit and vegetable intake. Reports medication compliance.;Short term: Daily weights obtained and reported for increase.  Utilizing diuretic protocols set by physician.;Long term: Adoption of self-care skills and reduction of barriers for early signs and symptoms recognition and intervention leading to self-care maintenance.   Stress Yes  His Mother was in the hospital same time he was   Intervention Offer individual and/or small group education and counseling on adjustment to heart disease, stress management and health-related lifestyle change. Teach and support self-help strategies.;Refer participants experiencing significant psychosocial distress to appropriate mental health specialists for further evaluation and treatment. When possible, include family members and significant others in education/counseling sessions.   Expected Outcomes Short Term: Participant demonstrates changes in health-related behavior, relaxation and other stress management skills, ability to obtain effective social support, and compliance with psychotropic medications if prescribed.;Long Term: Emotional wellbeing is indicated by absence of clinically significant psychosocial distress or social isolation.      Core Components/Risk Factors/Patient Goals Review:      Goals and Risk Factor Review    Row Name 03/05/17 1037             Core Components/Risk Factors/Patient Goals Review   Personal Goals Review Weight Management/Obesity;Heart Failure;Hypertension;Lipids;Diabetes       Review Latavius has been doing well in rehab.  His weight has been pretty steady.  He does weigh daily and has not had any symptoms of heart failure.  His blood sugars have been around 112 mg/dl and checks daily.  He does not check his blood pressure at home but it has been good while he is here.  Jamier has not had any problems with his medications and feel they are working for him.         Expected Outcomes Short: Continue to stay on top of weight and heart failure.  Long: Continue to work on risk factor modifications.          Core Components/Risk  Factors/Patient Goals at Discharge (Final Review):      Goals and Risk Factor Review - 03/05/17 1037      Core Components/Risk Factors/Patient Goals Review  Personal Goals Review Weight Management/Obesity;Heart Failure;Hypertension;Lipids;Diabetes   Review Prophet has been doing well in rehab.  His weight has been pretty steady.  He does weigh daily and has not had any symptoms of heart failure.  His blood sugars have been around 112 mg/dl and checks daily.  He does not check his blood pressure at home but it has been good while he is here.  Jarred has not had any problems with his medications and feel they are working for him.     Expected Outcomes Short: Continue to stay on top of weight and heart failure.  Long: Continue to work on risk factor modifications.      ITP Comments:     ITP Comments    Row Name 01/14/17 1123 01/16/17 0619 02/13/17 0901 02/19/17 1443 03/13/17 0539   ITP Comments ITP Created during Medical review Documentation DX of 12/19/2016 [place] [facility] Note 30 day review. Continue with ITP unless directed changes per Medical Director review   New to program has attended medical review 30 day review. Continue with ITP unless directed changes per Medical Director review  new to program  Called to check on status of return.  Orby called out last week with cold and yesterday for same reason.  Left message on machine. 30 day review. Continue with ITP unless directed changes per Medical Director review      Comments:

## 2017-03-20 ENCOUNTER — Ambulatory Visit: Payer: 59 | Admitting: Family

## 2017-03-21 ENCOUNTER — Telehealth: Payer: Self-pay | Admitting: *Deleted

## 2017-03-21 NOTE — Telephone Encounter (Signed)
Philip Ponce called to let us know that he has been out with working overtime at work.  He will also be out all of next week and will return on July 10th.

## 2017-03-22 ENCOUNTER — Ambulatory Visit: Payer: 59 | Attending: Family | Admitting: Family

## 2017-03-22 ENCOUNTER — Encounter: Payer: Self-pay | Admitting: Family

## 2017-03-22 VITALS — BP 132/79 | HR 92 | Resp 20 | Ht 73.0 in | Wt 190.0 lb

## 2017-03-22 DIAGNOSIS — Z9884 Bariatric surgery status: Secondary | ICD-10-CM | POA: Diagnosis not present

## 2017-03-22 DIAGNOSIS — I5022 Chronic systolic (congestive) heart failure: Secondary | ICD-10-CM | POA: Diagnosis present

## 2017-03-22 DIAGNOSIS — Z794 Long term (current) use of insulin: Secondary | ICD-10-CM | POA: Diagnosis not present

## 2017-03-22 DIAGNOSIS — Z823 Family history of stroke: Secondary | ICD-10-CM | POA: Insufficient documentation

## 2017-03-22 DIAGNOSIS — I110 Hypertensive heart disease with heart failure: Secondary | ICD-10-CM | POA: Insufficient documentation

## 2017-03-22 DIAGNOSIS — I252 Old myocardial infarction: Secondary | ICD-10-CM | POA: Diagnosis not present

## 2017-03-22 DIAGNOSIS — E109 Type 1 diabetes mellitus without complications: Secondary | ICD-10-CM | POA: Diagnosis not present

## 2017-03-22 DIAGNOSIS — E119 Type 2 diabetes mellitus without complications: Secondary | ICD-10-CM

## 2017-03-22 DIAGNOSIS — I2510 Atherosclerotic heart disease of native coronary artery without angina pectoris: Secondary | ICD-10-CM | POA: Diagnosis not present

## 2017-03-22 DIAGNOSIS — I10 Essential (primary) hypertension: Secondary | ICD-10-CM

## 2017-03-22 LAB — BASIC METABOLIC PANEL
ANION GAP: 6 (ref 5–15)
BUN: 18 mg/dL (ref 6–20)
CALCIUM: 8.9 mg/dL (ref 8.9–10.3)
CO2: 29 mmol/L (ref 22–32)
Chloride: 104 mmol/L (ref 101–111)
Creatinine, Ser: 0.98 mg/dL (ref 0.61–1.24)
GFR calc Af Amer: >60 mL/min (ref >60)
GLUCOSE: 174 mg/dL — AB (ref 65–99)
Potassium: 4 mmol/L (ref 3.5–5.1)
Sodium: 139 mmol/L (ref 135–145)

## 2017-03-22 NOTE — Progress Notes (Signed)
Patient ID: Philip Ponce, male    DOB: [birth year], 46 y.o.   MRN: [id number]  HPI  Philip Ponce is a 43 yoM with PMH significant for DM, iron deficiency, HTN, gastric bypass, STEMI and CHF with reduced ejection fraction.  Last echo was done 12/05/16 and showed an EF of 20-25% with mild Philip. Echo on 04/14/15 showed EF of 50-55%.  Last admission was 12/04/16 for chest pain. Workup for STEMI and cath showed no significant CAD. Pt also was experiencing symptoms of CHF and was treated with IV furosemide.   Presents today for a follow-up visit with a chief complaint of mild shortness of breath with moderate exertion. He says this has been present for a few months but has worsened over the last few weeks because of his work schedule. He has associated fatigue, chest tightness and disrupted sleep pattern along with this.   Past Medical History:  Diagnosis Date  . Acute blood loss anemia 11/20/2016  . CHF (congestive heart failure) ([place])   . Coronary artery disease   . Diabetes mellitus type I ([place])   . Slipped intervertebral disc    L4 L5  . STEMI (ST elevation myocardial infarction) [facility])    Past Surgical History:  Procedure Laterality Date  . COLONOSCOPY WITH PROPOFOL N/A 12/03/2016   Procedure: COLONOSCOPY WITH PROPOFOL;  Surgeon: [name], MD;  Location: [facility] ENDOSCOPY;  Service: Endoscopy;  Laterality: N/A;  . ESOPHAGOGASTRODUODENOSCOPY (EGD) WITH PROPOFOL N/A 12/03/2016   Procedure: ESOPHAGOGASTRODUODENOSCOPY (EGD) WITH PROPOFOL;  Surgeon: [name], MD;  Location: [facility] ENDOSCOPY;  Service: Endoscopy;  Laterality: N/A;  . GASTRIC BYPASS    . GIVENS CAPSULE STUDY N/A 01/07/2017   Procedure: GIVENS CAPSULE STUDY;  Surgeon: [name], MD;  Location: [facility] ENDOSCOPY;  Service: Endoscopy;  Laterality: N/A;  . LEFT HEART CATH AND CORONARY ANGIOGRAPHY N/A 12/04/2016   Procedure: Left Heart Cath and Coronary Angiography;  Surgeon: [name], MD;  Location: [facility] CV LAB;  Service:  Cardiovascular;  Laterality: N/A;   Family History  Problem Relation Age of Onset  . Diabetes Mother   . Hypertension Mother   . Cancer Mother        breast  . Diabetes Father   . Hypertension Father   . Cancer Father        colon  . Cancer Maternal Grandfather        prostate  . Stroke Maternal Grandfather        CVA   Social History  Substance Use Topics  . Smoking status: Never Smoker  . Smokeless tobacco: Never Used  . Alcohol use No   Allergies  Allergen Reactions  . Mobic [Meloxicam] Other (See Comments)    Ulcers in stomach eruption   . Diclofenac Other (See Comments)    Fast heart beat,, CP,SOB and weakness on one side.   Prior to Admission medications   Medication Sig Start Date End Date Taking? Authorizing Provider  calcium carbonate (OS-CAL) 600 MG TABS tablet Take 600 mg by mouth 2 (two) times daily with a meal.   Yes [provider]  carvedilol (COREG) 6.25 MG tablet Take 1 tablet (6.25 mg total) by mouth 2 (two) times daily. 02/21/17  Yes End, Harrell Gave, MD  furosemide (LASIX) 20 MG tablet Take 1 tablet (20 mg total) by mouth daily as needed. Take for > 2lb weight gain in 1 day or > 5lb weight gain in 1 week. 12/19/16 03/22/17 Yes End, Harrell Gave, MD  Garlic 259  MG TABS Take 100 mg by mouth daily.   Yes [provider]  glucose blood (ONE TOUCH ULTRA TEST) test strip Check blood sugar once daily and as instructed. Dx E11.9 01/19/16  Yes Bedsole, Amy E, MD  Insulin Glargine (TOUJEO SOLOSTAR) 300 UNIT/ML SOPN Inject 10 Units into the skin daily. Patient taking differently: Inject 15 Units into the skin daily.  12/27/16  Yes Bedsole, Amy E, MD  metFORMIN (GLUCOPHAGE-XR) 500 MG 24 hr tablet Take 4 tablets (2,000 mg total) by mouth daily with breakfast. Patient taking differently: Take 1,000 mg by mouth 2 (two) times daily.  12/11/16  Yes Bedsole, Amy E, MD  Multiple Vitamins-Minerals (MENS MULTI VITAMIN & MINERAL PO) Take 2 tablets by mouth daily.  Men's Vita Fusion   Yes [provider]  Omega-3 Fatty Acids (FISH OIL) 1200 MG CAPS Take 1 capsule by mouth daily.   Yes [provider]  omeprazole (PRILOSEC) 40 MG capsule Take 1 capsule (40 mg total) by mouth daily. 11/23/16 03/22/17 Yes Tonia Ghent, MD  pseudoephedrine-guaifenesin (TUSSIN PE) 30-100 MG/5ML SYRP Take 5 mLs by mouth every 4 (four) hours as needed.   Yes [provider]  sacubitril-valsartan (ENTRESTO) 49-51 MG Take 1 tablet by mouth 2 (two) times daily. 02/15/17  Yes Alisa Graff, FNP    Review of Systems  Constitutional: Positive for fatigue. Negative for appetite change.  HENT: Negative for congestion, postnasal drip and sore throat.   Eyes: Negative.   Respiratory: Positive for chest tightness (with working lots of overtime) and shortness of breath. Negative for wheezing. Stridor: improving.   Cardiovascular: Negative for chest pain, palpitations and leg swelling.  Gastrointestinal: Negative for abdominal distention and abdominal pain.  Endocrine: Negative.   Genitourinary: Negative.   Musculoskeletal: Negative for back pain and neck pain.  Skin: Negative.   Allergic/Immunologic: Negative.   Neurological: Negative for dizziness and light-headedness.  Hematological: Negative for adenopathy. Does not bruise/bleed easily.  Psychiatric/Behavioral: Positive for sleep disturbance (working lots of overtime; disrupted sleep pattern). Negative for dysphoric mood. The patient is not nervous/anxious.    Vitals:   03/22/17 0849  BP: 132/79  Pulse: 92  Resp: 20  SpO2: 100%  Weight: 190 lb (86.2 kg)  Height: 6\' 1"  (1.854 m)   Wt Readings from Last 3 Encounters:  03/22/17 190 lb (86.2 kg)  02/21/17 189 lb 8 oz (86 kg)  02/15/17 188 lb 6 oz (85.4 kg)   Lab Results  Component Value Date   CREATININE 0.88 01/14/2017   CREATININE 0.87 12/06/2016   CREATININE 0.71 12/05/2016    Physical Exam  Constitutional: He is oriented to person,  place, and time. He appears well-developed and well-nourished.  HENT:  Head: Normocephalic and atraumatic.  Neck: Normal range of motion. Neck supple. No JVD present.  Cardiovascular: Normal rate and regular rhythm.   Pulmonary/Chest: Effort normal. He has no wheezes. He has no rales.  Abdominal: Soft. He exhibits no distension. There is no tenderness.  Musculoskeletal: He exhibits no edema or tenderness.  Neurological: He is alert and oriented to person, place, and time.  Skin: Skin is warm and dry.  Psychiatric: He has a normal mood and affect. His behavior is normal. Thought content normal.  Nursing note and vitals reviewed.   Assessment & Plan:  1. Congestive heart failure with reduced ejection fraction - NYHA class II - euvolemic - Weighing daily and logging. Reminded to call for weight gain of > 2 lbs overnight or >  5 lbs in one week - not adding salt. Encouraged following < 2000 mg/day sodium intake. Reading food labels for sodium and carbs - continues to participate in cardiac rehab except for recently as he's been working lots of overtime - entresto increased to 49/51mg  last visit - BMP drawn today - saw cardiologist (End) 02/21/17 and had carvedilol increased and returns to him 04/17/17 - discussed increasing carvedilol or entresto at his next visit - hasn't been sleeping well as he's been working a lot of overtime at work. Gets home around 1AM and then goes back in at noon and works until after midnight. Feels like his symptoms have worsened since he's been working so much. Will have vacation next week and plans on getting plenty of rest at that time - discussed the importance of balancing work and rest  2. Diabetes - A1c on 11/27/16 was 9.6% - Checking daily and his glucose at home this morning was 114 - No lows, discussed s/sx of lows - Saw PCP on 12/27/16 (Bedsole)  3. HTN - BP looked good today 132/79  Patient did not bring his medications nor a list. Each medication  was verbally reviewed with the patient and he was encouraged to bring the bottles to every visit to confirm accuracy of list.  Return in 1 month or sooner for any questions/problems before then.

## 2017-03-28 ENCOUNTER — Other Ambulatory Visit: Payer: 59

## 2017-03-29 ENCOUNTER — Encounter: Payer: Self-pay | Admitting: Internal Medicine

## 2017-04-01 ENCOUNTER — Inpatient Hospital Stay: Payer: 59 | Attending: Oncology

## 2017-04-02 ENCOUNTER — Telehealth: Payer: Self-pay

## 2017-04-02 ENCOUNTER — Encounter: Payer: 59 | Attending: Internal Medicine

## 2017-04-02 DIAGNOSIS — I10 Essential (primary) hypertension: Secondary | ICD-10-CM | POA: Insufficient documentation

## 2017-04-02 DIAGNOSIS — I5022 Chronic systolic (congestive) heart failure: Secondary | ICD-10-CM | POA: Insufficient documentation

## 2017-04-02 NOTE — Telephone Encounter (Signed)
Qunetin left a message that he would miss today's session due to work.

## 2017-04-04 ENCOUNTER — Encounter: Payer: 59 | Admitting: *Deleted

## 2017-04-04 DIAGNOSIS — I213 ST elevation (STEMI) myocardial infarction of unspecified site: Secondary | ICD-10-CM

## 2017-04-04 DIAGNOSIS — I5022 Chronic systolic (congestive) heart failure: Secondary | ICD-10-CM | POA: Diagnosis not present

## 2017-04-04 DIAGNOSIS — I10 Essential (primary) hypertension: Secondary | ICD-10-CM | POA: Diagnosis not present

## 2017-04-04 NOTE — Progress Notes (Signed)
Daily Session Note  Patient Details  Name: Philip Ponce MRN: [id number] Date of Birth: 25-Jan-1971 Referring Provider:     Cardiac Rehab from 01/14/2017 in [facility] Cardiac and Pulmonary Rehab  Referring Provider  End, Harrell Gave MD      Encounter Date: 04/04/2017  Check In:     Session Check In - 04/04/17 0829      Check-In   Location [facility]-Cardiac & Pulmonary Rehab   Staff Present Nyoka Cowden, RN, BSN, MA;Meredith Sherryll Burger, RN Vickki Hearing, BA, ACSM CEP, Exercise Physiologist   Supervising physician immediately available to respond to emergencies See telemetry face sheet for immediately available ER MD   Medication changes reported     No   Fall or balance concerns reported    No   Warm-up and Cool-down Performed on first and last piece of equipment   Resistance Training Performed Yes   VAD Patient? No     Pain Assessment   Currently in Pain? No/denies         History  Smoking Status  . Never Smoker  Smokeless Tobacco  . Never Used    Goals Met:  Proper associated with RPD/PD & O2 Sat Independence with exercise equipment Exercise tolerated well Personal goals reviewed No report of cardiac concerns or symptoms Strength training completed today  Goals Unmet:  Not Applicable  Comments: Pt able to follow exercise prescription today without complaint.  Will continue to monitor for progression.    Dr. Emily Filbert is Medical Director for [facility].

## 2017-04-08 ENCOUNTER — Other Ambulatory Visit: Payer: Self-pay

## 2017-04-08 ENCOUNTER — Encounter: Payer: Self-pay | Admitting: Emergency Medicine

## 2017-04-08 ENCOUNTER — Telehealth: Payer: Self-pay | Admitting: *Deleted

## 2017-04-08 ENCOUNTER — Emergency Department
Admission: EM | Admit: 2017-04-08 | Discharge: 2017-04-08 | Disposition: A | Discharge status: Home / Self Care | Payer: 59 | Attending: Emergency Medicine | Admitting: Emergency Medicine

## 2017-04-08 ENCOUNTER — Encounter: Payer: Self-pay | Admitting: *Deleted

## 2017-04-08 DIAGNOSIS — E119 Type 2 diabetes mellitus without complications: Secondary | ICD-10-CM | POA: Diagnosis not present

## 2017-04-08 DIAGNOSIS — Z794 Long term (current) use of insulin: Secondary | ICD-10-CM | POA: Diagnosis not present

## 2017-04-08 DIAGNOSIS — Z79899 Other long term (current) drug therapy: Secondary | ICD-10-CM | POA: Diagnosis not present

## 2017-04-08 DIAGNOSIS — I2510 Atherosclerotic heart disease of native coronary artery without angina pectoris: Secondary | ICD-10-CM | POA: Insufficient documentation

## 2017-04-08 DIAGNOSIS — M25512 Pain in left shoulder: Secondary | ICD-10-CM | POA: Insufficient documentation

## 2017-04-08 DIAGNOSIS — I5022 Chronic systolic (congestive) heart failure: Secondary | ICD-10-CM | POA: Diagnosis not present

## 2017-04-08 DIAGNOSIS — I110 Hypertensive heart disease with heart failure: Secondary | ICD-10-CM | POA: Insufficient documentation

## 2017-04-08 DIAGNOSIS — I10 Essential (primary) hypertension: Secondary | ICD-10-CM | POA: Diagnosis not present

## 2017-04-08 DIAGNOSIS — M545 Low back pain: Secondary | ICD-10-CM | POA: Diagnosis present

## 2017-04-08 DIAGNOSIS — M25511 Pain in right shoulder: Secondary | ICD-10-CM | POA: Diagnosis not present

## 2017-04-08 DIAGNOSIS — I213 ST elevation (STEMI) myocardial infarction of unspecified site: Secondary | ICD-10-CM

## 2017-04-08 MED ORDER — HYDROCODONE-ACETAMINOPHEN 5-325 MG PO TABS: 1.0000 | ORAL_TABLET | ORAL | 0 refills | Status: DC | PRN

## 2017-04-08 MED ORDER — METHOCARBAMOL 500 MG PO TABS: 1000.0000 mg | ORAL_TABLET | Freq: Four times a day (QID) | ORAL | 0 refills | Status: DC | PRN

## 2017-04-08 MED ORDER — HYDROCODONE-ACETAMINOPHEN 5-325 MG PO TABS
2.0000 | ORAL_TABLET | Freq: Once | ORAL | Status: AC
  Administered 2017-04-08: 2 via ORAL
  Filled 2017-04-08: qty 2

## 2017-04-08 MED ORDER — ORPHENADRINE CITRATE 30 MG/ML IJ SOLN
60.0000 mg | Freq: Two times a day (BID) | INTRAMUSCULAR | Status: DC
  Administered 2017-04-08: 60 mg via INTRAMUSCULAR
  Filled 2017-04-08: qty 2

## 2017-04-08 NOTE — Telephone Encounter (Signed)
Philip Ponce called to let us know that he was just leaving the emergency room after pulling back muscle and getting a cortisone injection.  He will be out tomorrow and will miss Thursday as well due to a doctor's appointment.  He hopes to return next week.

## 2017-04-08 NOTE — Discharge Instructions (Signed)
Moist heat or ice to your back as needed for comfort. Continue taking medication as directed. Follow-up with your primary care doctor if any continued problems with your shoulder.

## 2017-04-08 NOTE — ED Provider Notes (Signed)
-----------------------------------------   2:41 PM on 04/08/2017 -----------------------------------------  EKG reviewed and interpreted by myself shows normal sinus rhythm at 70 bpm, widened QRS, normal axis, normal intervals nonspecific ST changes, left bundle branch block. EKG unchanged from prior.   Harvest Dark, MD 04/08/17 1441

## 2017-04-08 NOTE — ED Provider Notes (Signed)
[facility] Emergency Department Provider Note   ____________________________________________   First MD Initiated Contact with Patient 04/08/17 1418     (approximate)  I have reviewed the triage vital signs and the nursing notes.   HISTORY  Chief Complaint Back Pain   HPI Philip Ponce is a 46 y.o. male is here complaining of left shoulder and arm pain that developed while he was on vacation. Patient states that he woke with a "spasm-like pain" and thought it was due to the position he was sleeping in. He is taken some over-the-counter medication without any improvement of his pain. He denies any chest pain, shortness of breath, diaphoresis, or indigestion. Patient states that he does have a left bundle branch block and has a copy of his previous EKG in his wallet.   Patient rates his pain as 6 out of 10. Pain is increased with range of motion of his left shoulder.   Past Medical History:  Diagnosis Date  . Acute blood loss anemia 11/20/2016  . CHF (congestive heart failure) ([place])   . Coronary artery disease   . Diabetes mellitus type I (Philip Ponce)   . Slipped intervertebral disc    L4 L5  . STEMI (ST elevation myocardial infarction) [facility])     Patient Active Problem List   Diagnosis Date Noted  . Chronic systolic heart failure (Philip Ponce) 12/18/2016  . NICM (nonischemic cardiomyopathy) ([place]) 12/04/2016  . Iron deficiency anemia   . History of bariatric surgery 11/20/2016  . Toenail fungus 10/14/2015  . RBBB 04/07/2015  . LBBB (left bundle branch block) 09/24/2013  . Obstructive apnea 09/24/2013  . GERD 09/29/2010  . 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 Essential hypertension, benign 07/09/2007  . HYPERCHOLESTEROLEMIA, PURE 06/12/2007  . Diabetes mellitus with no complication (Philip Ponce) 24/26/8341    Past Surgical History:  Procedure Laterality Date  . COLONOSCOPY WITH PROPOFOL N/A 12/03/2016   Procedure: COLONOSCOPY WITH PROPOFOL;  Surgeon:  [name], MD;  Location: [facility] ENDOSCOPY;  Service: Endoscopy;  Laterality: N/A;  . ESOPHAGOGASTRODUODENOSCOPY (EGD) WITH PROPOFOL N/A 12/03/2016   Procedure: ESOPHAGOGASTRODUODENOSCOPY (EGD) WITH PROPOFOL;  Surgeon: [name], MD;  Location: [facility] ENDOSCOPY;  Service: Endoscopy;  Laterality: N/A;  . GASTRIC BYPASS    . GIVENS CAPSULE STUDY N/A 01/07/2017   Procedure: GIVENS CAPSULE STUDY;  Surgeon: [name], MD;  Location: [facility] ENDOSCOPY;  Service: Endoscopy;  Laterality: N/A;  . LEFT HEART CATH AND CORONARY ANGIOGRAPHY N/A 12/04/2016   Procedure: Left Heart Cath and Coronary Angiography;  Surgeon: [name], MD;  Location: [facility] CV LAB;  Service: Cardiovascular;  Laterality: N/A;    Prior to Admission medications   Medication Sig Start Date End Date Taking? Authorizing Provider  calcium carbonate (OS-CAL) 600 MG TABS tablet Take 600 mg by mouth 2 (two) times daily with a meal.    [provider]  carvedilol (COREG) 6.25 MG tablet Take 1 tablet (6.25 mg total) by mouth 2 (two) times daily. 02/21/17   End, Harrell Gave, MD  furosemide (LASIX) 20 MG tablet Take 1 tablet (20 mg total) by mouth daily as needed. Take for > 2lb weight gain in 1 day or > 5lb weight gain in 1 week. 12/19/16 03/22/17  End, Harrell Gave, MD  Garlic 962 MG TABS Take 100 mg by mouth daily.    [provider]  glucose blood (ONE TOUCH ULTRA TEST) test strip Check blood sugar once daily and as instructed. Dx E11.9 01/19/16   [name], MD  HYDROcodone-acetaminophen (NORCO/VICODIN) 5-325 MG tablet Take 1 tablet by mouth every 4 (four) hours as needed for moderate pain. 04/08/17   Johnn Hai, PA-C  Insulin Glargine (TOUJEO SOLOSTAR) 300 UNIT/ML SOPN Inject 10 Units into the skin daily. Patient taking differently: Inject 15 Units into the skin daily.  12/27/16   Bedsole, Amy E, MD  metFORMIN (GLUCOPHAGE-XR) 500 MG 24 hr tablet Take 4 tablets (2,000 mg total) by mouth daily with  breakfast. Patient taking differently: Take 1,000 mg by mouth 2 (two) times daily.  12/11/16   Bedsole, Amy E, MD  methocarbamol (ROBAXIN) 500 MG tablet Take 2 tablets (1,000 mg total) by mouth every 6 (six) hours as needed for muscle spasms. 04/08/17   Johnn Hai, PA-C  Multiple Vitamins-Minerals (MENS MULTI VITAMIN & MINERAL PO) Take 2 tablets by mouth daily. Men's Vita Fusion    [provider]  Omega-3 Fatty Acids (FISH OIL) 1200 MG CAPS Take 1 capsule by mouth daily.    [provider]  omeprazole (PRILOSEC) 40 MG capsule Take 1 capsule (40 mg total) by mouth daily. 11/23/16 03/22/17  Tonia Ghent, MD  pseudoephedrine-guaifenesin (TUSSIN PE) 30-100 MG/5ML SYRP Take 5 mLs by mouth every 4 (four) hours as needed.    [provider]  sacubitril-valsartan (ENTRESTO) 49-51 MG Take 1 tablet by mouth 2 (two) times daily. 02/15/17   Alisa Graff, FNP    Allergies Mobic [meloxicam] and Diclofenac  Family History  Problem Relation Age of Onset  . Diabetes Mother   . Hypertension Mother   . Cancer Mother        breast  . Diabetes Father   . Hypertension Father   . Cancer Father        colon  . Cancer Maternal Grandfather        prostate  . Stroke Maternal Grandfather        CVA    Social History Social History  Substance Use Topics  . Smoking status: Never Smoker  . Smokeless tobacco: Never Used  . Alcohol use No    Review of Systems Constitutional: No fever/chills Cardiovascular: Denies chest pain. Respiratory: Denies shortness of breath. Gastrointestinal: No abdominal pain.  No nausea, no vomiting.  Musculoskeletal: Positive for left shoulder pain. Skin: Negative for rash. Neurological: Negative for headaches, focal weakness or numbness. ____________________________________________   PHYSICAL EXAM:  VITAL SIGNS: ED Triage Vitals  Enc Vitals Group     BP 04/08/17 1342 (!) 147/105     Pulse Rate 04/08/17 1342 80     Resp 04/08/17  1342 16     Temp 04/08/17 1359 98.2 F (36.8 C)     Temp Source 04/08/17 1359 Oral     SpO2 04/08/17 1342 100 %     Weight 04/08/17 1342 190 lb (86.2 kg)     Height 04/08/17 1342 6\' 1"  (1.854 m)     Head Circumference --      Peak Flow --      Pain Score 04/08/17 1342 6     Pain Loc --      Pain Edu? --      Excl. in [place]? --    Constitutional: Alert and oriented. Well appearing and in no acute distress. Eyes: Conjunctivae are normal. PERRL. EOMI. Head: Atraumatic. Neck: No stridor.   Cardiovascular: Normal rate, regular rhythm. Grossly normal heart sounds.  Good peripheral circulation. Respiratory: Normal respiratory effort.  No retractions. Lungs CTAB. Musculoskeletal: Examination of the left shoulder there  is no gross deformity noted. There is marked tenderness on palpation of the pain is scapular muscles along with the rhomboid muscles. No soft tissue swelling is appreciated. Range of motion with the shoulder is guarded secondary to muscle spasms however there is no crepitus noted. There is no tenderness on palpation of the clavicle. Remainder of the left upper extremity is within normal limits. Neurologic:  Normal speech and language. No gross focal neurologic deficits are appreciated. No gait instability. Skin:  Skin is warm, dry and intact. No warmth, erythema, ecchymosis or abrasions are noted. Psychiatric: Mood and affect are normal. Speech and behavior are normal.  ____________________________________________   LABS (all labs ordered are listed, but only abnormal results are displayed)  Labs Reviewed - No data to display  PROCEDURES  Procedure(s) performed: None  Procedures  Critical Care performed: No  ____________________________________________   INITIAL IMPRESSION / ASSESSMENT AND PLAN / ED COURSE  Pertinent labs & imaging results that were available during my care of the patient were reviewed by me and considered in my medical decision making (see chart for  details).  Patient was given Vicodin 2 tablets by mouth and Norflex 60 mg IM while in the department. Prior to discharge he was greater than 50% improved. Patient is continue taking medication and given a prescription for Robaxin 500 mg 2 tablets every 6 hours as needed for muscle spasms along with Norco one tablet every 4 hours as needed for pain. He is encouraged to use ice or heat to his muscles. He was given a note to return to work on 04/10/17.      ____________________________________________   FINAL CLINICAL IMPRESSION(S) / ED DIAGNOSES  Final diagnoses:  Acute pain of left shoulder      NEW MEDICATIONS STARTED DURING THIS VISIT:  Discharge Medication List as of 04/08/2017  4:05 PM    START taking these medications   Details  HYDROcodone-acetaminophen (NORCO/VICODIN) 5-325 MG tablet Take 1 tablet by mouth every 4 (four) hours as needed for moderate pain., Starting Mon 04/08/2017, Print    methocarbamol (ROBAXIN) 500 MG tablet Take 2 tablets (1,000 mg total) by mouth every 6 (six) hours as needed for muscle spasms., Starting Mon 04/08/2017, Print         Note:  This document was prepared using Dragon voice recognition software and may include unintentional dictation errors.    Johnn Hai, PA-C 04/08/17 1910    Harvest Dark, MD 04/09/17 Sharilyn Sites

## 2017-04-08 NOTE — ED Triage Notes (Signed)
States he developed spasm-like pain to post left shoulder and arm on sat  denies any injury

## 2017-04-10 ENCOUNTER — Encounter: Payer: Self-pay | Admitting: *Deleted

## 2017-04-10 DIAGNOSIS — I5022 Chronic systolic (congestive) heart failure: Secondary | ICD-10-CM

## 2017-04-10 DIAGNOSIS — I213 ST elevation (STEMI) myocardial infarction of unspecified site: Secondary | ICD-10-CM

## 2017-04-10 NOTE — Progress Notes (Signed)
Cardiac Individual Treatment Plan  Patient Details  Name: ERBY SANDERSON MRN: [id number] Date of Birth: [birth year] Referring Provider:     Cardiac Rehab from 01/14/2017 in [facility] Cardiac and Pulmonary Rehab  Referring Provider  End, Harrell Gave MD      Initial Encounter Date:    Cardiac Rehab from 01/14/2017 in [facility] Cardiac and Pulmonary Rehab  Date  01/14/17  Referring Provider  End, Harrell Gave MD      Visit Diagnosis: ST elevation myocardial infarction (STEMI), unspecified artery ([place])  Heart failure, chronic systolic ([place])  Patient's Home Medications on Admission:  Current Outpatient Prescriptions:  .  calcium carbonate (OS-CAL) 600 MG TABS tablet, Take 600 mg by mouth 2 (two) times daily with a meal., Disp: , Rfl:  .  carvedilol (COREG) 6.25 MG tablet, Take 1 tablet (6.25 mg total) by mouth 2 (two) times daily., Disp: 180 tablet, Rfl: 3 .  furosemide (LASIX) 20 MG tablet, Take 1 tablet (20 mg total) by mouth daily as needed. Take for > 2lb weight gain in 1 day or > 5lb weight gain in 1 week., Disp: 90 tablet, Rfl: 3 .  Garlic 341 MG TABS, Take 100 mg by mouth daily., Disp: , Rfl:  .  glucose blood (ONE TOUCH ULTRA TEST) test strip, Check blood sugar once daily and as instructed. Dx E11.9, Disp: 100 each, Rfl: 1 .  HYDROcodone-acetaminophen (NORCO/VICODIN) 5-325 MG tablet, Take 1 tablet by mouth every 4 (four) hours as needed for moderate pain., Disp: 20 tablet, Rfl: 0 .  Insulin Glargine (TOUJEO SOLOSTAR) 300 UNIT/ML SOPN, Inject 10 Units into the skin daily. (Patient taking differently: Inject 15 Units into the skin daily. ), Disp: 1.5 mL, Rfl: 11 .  metFORMIN (GLUCOPHAGE-XR) 500 MG 24 hr tablet, Take 4 tablets (2,000 mg total) by mouth daily with breakfast. (Patient taking differently: Take 1,000 mg by mouth 2 (two) times daily. ), Disp: 120 tablet, Rfl: 11 .  methocarbamol (ROBAXIN) 500 MG tablet, Take 2 tablets (1,000 mg total) by mouth every 6 (six) hours as needed for  muscle spasms., Disp: 30 tablet, Rfl: 0 .  Multiple Vitamins-Minerals (MENS MULTI VITAMIN & MINERAL PO), Take 2 tablets by mouth daily. Men's Vita Fusion, Disp: , Rfl:  .  Omega-3 Fatty Acids (FISH OIL) 1200 MG CAPS, Take 1 capsule by mouth daily., Disp: , Rfl:  .  omeprazole (PRILOSEC) 40 MG capsule, Take 1 capsule (40 mg total) by mouth daily., Disp: 30 capsule, Rfl: 0 .  pseudoephedrine-guaifenesin (TUSSIN PE) 30-100 MG/5ML SYRP, Take 5 mLs by mouth every 4 (four) hours as needed., Disp: , Rfl:  .  sacubitril-valsartan (ENTRESTO) 49-51 MG, Take 1 tablet by mouth 2 (two) times daily., Disp: 60 tablet, Rfl: 5  Past Medical History: Past Medical History:  Diagnosis Date  . Acute blood loss anemia 11/20/2016  . CHF (congestive heart failure) ([place])   . Coronary artery disease   . Diabetes mellitus type I ([place])   . Slipped intervertebral disc    L4 L5  . STEMI (ST elevation myocardial infarction) ([place])     Tobacco Use: History  Smoking Status  . Never Smoker  Smokeless Tobacco  . Never Used    Labs: Recent Review Flowsheet Data    Labs for ITP Cardiac and Pulmonary Rehab Latest Ref Rng & Units 01/01/2013 04/15/2015 10/07/2015 01/03/2016 11/27/2016   Cholestrol 0 - 200 mg/dL - 101 121 - -   LDLCALC 0 - 99 mg/dL - 38 39 - -  HDL >39.00 mg/dL - 51.40 66.70 - -   Trlycerides 0.0 - 149.0 mg/dL - 58.0 78.0 - -   Hemoglobin A1c 4.6 - 6.5 % 8.6(H) 7.0(H) 10.5(H) 10.6(H) 9.6(H)       Exercise Target Goals:    Exercise Program Goal: Individual exercise prescription set with THRR, safety & activity barriers. Participant demonstrates ability to understand and report RPE using BORG scale, to self-measure pulse accurately, and to acknowledge the importance of the exercise prescription.  Exercise Prescription Goal: Starting with aerobic activity 30 plus minutes a day, 3 days per week for initial exercise prescription. Provide home exercise prescription and guidelines that participant  acknowledges understanding prior to discharge.  Activity Barriers & Risk Stratification:     Activity Barriers & Cardiac Risk Stratification - 01/14/17 1311      Activity Barriers & Cardiac Risk Stratification   Activity Barriers Balance Concerns;Decreased Ventricular Function;Deconditioning;Muscular Weakness  he said that sometimes his equilibrium gets off   Cardiac Risk Stratification High      6 Minute Walk:     6 Minute Walk    Row Name 01/14/17 1437         6 Minute Walk   Phase Initial     Distance 1570 feet     Walk Time 6 minutes     # of Rest Breaks 0     MPH 2.97     METS 5.17     RPE 9     VO2 Peak 18.11     Symptoms No     Resting HR 75 bpm     Resting BP 138/70     Max Ex. HR 112 bpm     Max Ex. BP 146/74     2 Minute Post BP 134/70        Oxygen Initial Assessment:   Oxygen Re-Evaluation:   Oxygen Discharge (Final Oxygen Re-Evaluation):   Initial Exercise Prescription:     Initial Exercise Prescription - 01/14/17 1400      Date of Initial Exercise RX and Referring Provider   Date 01/14/17   Referring Provider End, Harrell Gave MD     Treadmill   MPH 3   Grade 0.5   Minutes 15   METs 3.5     Elliptical   Level 1   Speed 4.5   Minutes 15     T5 Nustep   Level 3   SPM 80   Minutes 15   METs 2     Prescription Details   Frequency (times per week) 2   Duration Progress to 45 minutes of aerobic exercise without signs/symptoms of physical distress     Intensity   THRR 40-80% of Max Heartrate 114-153   Ratings of Perceived Exertion 11-13   Perceived Dyspnea 0-4     Progression   Progression Continue to progress workloads to maintain intensity without signs/symptoms of physical distress.     Resistance Training   Training Prescription Yes   Weight 4 lbs   Reps 10-15      Perform Capillary Blood Glucose checks as needed.  Exercise Prescription Changes:     Exercise Prescription Changes    Row Name 01/14/17 1400  02/06/17 1100 02/07/17 1000 02/21/17 1500 03/07/17 1100     Response to Exercise   Blood Pressure (Admit) 138/70 142/64  - 126/64 110/60   Blood Pressure (Exercise) 146/74 144/76  - 138/74 126/62   Blood Pressure (Exit) 134/70 124/64  - 118/62 124/64   Heart Rate (  Admit) 75 bpm 99 bpm  - 99 bpm 99 bpm   Heart Rate (Exercise) 112 bpm 151 bpm  - 120 bpm 135 bpm   Heart Rate (Exit) 89 bpm 88 bpm  - 88 bpm 77 bpm   Oxygen Saturation (Admit) 100 %  -  -  -  -   Oxygen Saturation (Exercise) 100 %  -  -  -  -   Rating of Perceived Exertion (Exercise) 9 13  - 11 14   Symptoms none none none none none   Comments walk test results  -  -  -  -   Duration  - Progress to 45 minutes of aerobic exercise without signs/symptoms of physical distress Progress to 45 minutes of aerobic exercise without signs/symptoms of physical distress Progress to 45 minutes of aerobic exercise without signs/symptoms of physical distress Progress to 45 minutes of aerobic exercise without signs/symptoms of physical distress   Intensity  - THRR unchanged THRR unchanged THRR unchanged THRR unchanged     Progression   Progression  - Continue to progress workloads to maintain intensity without signs/symptoms of physical distress. Continue to progress workloads to maintain intensity without signs/symptoms of physical distress. Continue to progress workloads to maintain intensity without signs/symptoms of physical distress. Continue to progress workloads to maintain intensity without signs/symptoms of physical distress.   Average METs  - 3.4 3.4 3.48 3.6     Resistance Training   Training Prescription  - Yes Yes Yes Yes   Weight  - 4 lbs 4 lbs 4 lbs 5 lbs   Reps  - 10-15 10-15 10-15 10-15     Interval Training   Interval Training  -  -  - No No     Treadmill   MPH  - 3 3 3.3 3.3   Grade  - 0.5 0.5 0.5 3   Minutes  - 15 15 15 15    METs  - 3.5 3.5 3.75 4.89     Elliptical   Level  - 1 1 1 1    Speed  - 4.5 4.5 4.5 4.5    Minutes  - 15 15 15 15      T5 Nustep   Level  - 3 3 3 5    Minutes  - 15 15 15 15    METs  - 3.3 3.3 3.2 2.3     Home Exercise Plan   Plans to continue exercise at  -  - [employer] (comment)  walking and Body by [employer] (comment)  walking and [employer] by [employer] (comment)  walking and Body by [employer]  -  - Add 3 additional days to program exercise sessions. Add 3 additional days to program exercise sessions. Add 3 additional days to program exercise sessions.   Initial Home Exercises Provided  -  - 02/07/17 02/07/17 02/07/17   Row Name 03/19/17 1400 04/04/17 1600           Response to Exercise   Blood Pressure (Admit) 140/78 132/64      Blood Pressure (Exercise) 160/80 158/74      Blood Pressure (Exit) 130/70 120/80      Heart Rate (Admit) 81 bpm 80 bpm      Heart Rate (Exercise) 126 bpm 134 bpm      Heart Rate (Exit) 90 bpm 79 bpm      Rating of Perceived Exertion (Exercise) 13 13      Symptoms none none  Duration Progress to 45 minutes of aerobic exercise without signs/symptoms of physical distress Progress to 45 minutes of aerobic exercise without signs/symptoms of physical distress      Intensity THRR unchanged THRR unchanged        Progression   Progression Continue to progress workloads to maintain intensity without signs/symptoms of physical distress. Continue to progress workloads to maintain intensity without signs/symptoms of physical distress.      Average METs 3.76 2.7        Resistance Training   Training Prescription Yes Yes      Weight 5 lbs 5 lbs      Reps 10-15 10-15        Interval Training   Interval Training No No        Treadmill   MPH 3.3  -      Grade 3  -      Minutes 15  -      METs 4.89  -        Elliptical   Level  - 1      Speed  - 4.5      Minutes  - 15        T5 Nustep   Level 5 5      Minutes 15 15      METs 2.7 2.7        Home Exercise Plan   Plans to continue exercise at  [facility] (comment)  walking and [employer]  -      Frequency Add 3 additional days to program exercise sessions.  -      Initial Home Exercises Provided 02/07/17  -         Exercise Comments:     Exercise Comments    Row Name 01/24/17 1049           Exercise Comments First full day of exercise!  Patient was oriented to gym and equipment including functions, settings, policies, and procedures.  Patient's individual exercise prescription and treatment plan were reviewed.  All starting workloads were established based on the results of the 6 minute walk test done at initial orientation visit.  The plan for exercise progression was also introduced and progression will be customized based on patient's performance and goals.          Exercise Goals and Review:     Exercise Goals    Row Name 01/14/17 1439             Exercise Goals   Increase Physical Activity Yes       Intervention Develop an individualized exercise prescription for aerobic and resistive training based on initial evaluation findings, risk stratification, comorbidities and participant's personal goals.;Provide advice, education, support and counseling about physical activity/exercise needs.       Expected Outcomes Achievement of increased cardiorespiratory fitness and enhanced flexibility, muscular endurance and strength shown through measurements of functional capacity and personal statement of participant.       Increase Strength and Stamina Yes       Intervention Provide advice, education, support and counseling about physical activity/exercise needs.;Develop an individualized exercise prescription for aerobic and resistive training based on initial evaluation findings, risk stratification, comorbidities and participant's personal goals.       Expected Outcomes Achievement of increased cardiorespiratory fitness and enhanced flexibility, muscular endurance and strength shown through measurements of  functional capacity and personal statement of participant.          Exercise Goals Re-Evaluation :  Exercise Goals Re-Evaluation    Row Name 02/06/17 1058 02/07/17 1010 02/21/17 1537 03/05/17 1022 03/19/17 1416     Exercise Goal Re-Evaluation   Exercise Goals Review Increase Physical Activity;Increase Strenth and Stamina Increase Physical Activity;Increase Strenth and Stamina Increase Physical Activity;Increase Strenth and Stamina Increase Physical Activity;Increase Strenth and Stamina Increase Physical Activity;Increase Strenth and Stamina   Comments Breion is off to a good start with rehab.  He is doing well and has completed two full sessions.  We will continue to monitor his progression. Reviewed home exercise with pt today.  Pt plans to walk and go to gym ([employer]) for exercise.  Reviewed THR, pulse, RPE, sign and symptoms, and when to call 911 or MD.  Also discussed weather considerations and indoor options.  Pt voiced understanding. Antony has been out sick since 02/07/17.  He had been doing well and hopes to return and be able to pick up where he left off. We will continue to monitor him when he is able to return. Cornelius has been doing well in rehab.  He is feeling better and much stronger.  He is now able to do more at work, almost back to normal!!  He has not been going to the gym as his work schedule has changed and his daughter is now home from school.  He has noticed how well he has progressed.   Vinayak has continued to do well in rehab. He has missed the last two sessions.  He is now up to level 5 on the NuStep.  We will continue to monitor his progression.   Expected Outcomes Short: Start to increase workloads.  Long: Continue to work on IT sales professional. ShortL Start to add more exercise in on off days.  Long: Make exercise part of routine. Short: Able to come to class regularly again.  Long: Work on IT sales professional. Short:Try to figure out schedule to allow for home  exercise.  Long: Continue to exercise long term. Short: Come to class regularly again.  Long: Continue to exercise at home regularly.   [place] Name 04/04/17 1623             Exercise Goal Re-Evaluation   Exercise Goals Review Increase Physical Activity;Increase Strenth and Stamina       Comments Jamail is back to class after missing sessions due to work.       Expected Outcomes  Short - Brandn will attend regularly.   Long - Oday will complete the program and graduate.          Discharge Exercise Prescription (Final Exercise Prescription Changes):     Exercise Prescription Changes - 04/04/17 1600      Response to Exercise   Blood Pressure (Admit) 132/64   Blood Pressure (Exercise) 158/74   Blood Pressure (Exit) 120/80   Heart Rate (Admit) 80 bpm   Heart Rate (Exercise) 134 bpm   Heart Rate (Exit) 79 bpm   Rating of Perceived Exertion (Exercise) 13   Symptoms none   Duration Progress to 45 minutes of aerobic exercise without signs/symptoms of physical distress   Intensity THRR unchanged     Progression   Progression Continue to progress workloads to maintain intensity without signs/symptoms of physical distress.   Average METs 2.7     Resistance Training   Training Prescription Yes   Weight 5 lbs   Reps 10-15     Interval Training   Interval Training No     Elliptical  Level 1   Speed 4.5   Minutes 15     T5 Nustep   Level 5   Minutes 15   METs 2.7      Nutrition:  Target Goals: Understanding of nutrition guidelines, daily intake of sodium 1500mg , cholesterol 200mg , calories 30% from fat and 7% or less from saturated fats, daily to have 5 or more servings of fruits and vegetables.  Biometrics:     Pre Biometrics - 01/14/17 1440      Pre Biometrics   Height 6' 0.8" (1.849 m)   Weight 183 lb 4.8 oz (83.1 kg)   Waist Circumference 30 inches   Hip Circumference 37.5 inches   Waist to Hip Ratio 0.8 %   BMI (Calculated) 24.4   Single Leg Stand 1.98  seconds       Nutrition Therapy Plan and Nutrition Goals:     Nutrition Therapy & Goals - 03/05/17 1100      Nutrition Therapy   RD appointment defered Yes      Nutrition Discharge: Rate Your Plate Scores:     Nutrition Assessments - 01/14/17 1311      MEDFICTS Scores   Pre Score 48      Nutrition Goals Re-Evaluation:     Nutrition Goals Re-Evaluation    Row Name 03/05/17 1100             Goals   Current Weight 191 lb (86.6 kg)       Comment Deny declined a nutrtion appointment.  He continues to follow the diet set by his bariatrtic surgery.       Expected Outcome Continue to follow diet.          Nutrition Goals Discharge (Final Nutrition Goals Re-Evaluation):     Nutrition Goals Re-Evaluation - 03/05/17 1100      Goals   Current Weight 191 lb (86.6 kg)   Comment Xeng declined a nutrtion appointment.  He continues to follow the diet set by his bariatrtic surgery.   Expected Outcome Continue to follow diet.      Psychosocial: Target Goals: Acknowledge presence or absence of significant depression and/or stress, maximize coping skills, provide positive support system. Participant is able to verbalize types and ability to use techniques and skills needed for reducing stress and depression.   Initial Review & Psychosocial Screening:     Initial Psych Review & Screening - 01/14/17 1303      Initial Review   Current issues with Current Stress Concerns   Source of Stress Concerns Family   Comments Quetin's Mother has been in the [facility] after being a "cancer survivor" and taking a test to make sure the cancer was gone but she had "kidney problems so the dye burned alot of her insides".      Family Dynamics   Good Support System? Yes     Barriers   Psychosocial barriers to participate in program The patient should benefit from training in stress management and relaxation.     Screening Interventions   Interventions Encouraged to  exercise;To provide support and resources with identified psychosocial needs      Quality of Life Scores:      Quality of Life - 01/14/17 1309      Quality of Life Scores   Health/Function Pre 30 %   Socioeconomic Pre 30 %   Psych/Spiritual Pre 30 %   Family Pre 30 %   GLOBAL Pre 30 %      PHQ-9:  Recent Review Flowsheet Data    Depression screen [facility] 2/9 03/22/2017 02/15/2017 01/14/2017 12/17/2016   Decreased Interest 0 0 0 0   Down, Depressed, Hopeless 0 0 0 0   PHQ - 2 Score 0 0 0 0   Altered sleeping - - 0 -   Tired, decreased energy - - 0 -   Change in appetite - - 0 -   Feeling bad or failure about yourself  - - 0 -   Trouble concentrating - - 0 -   Moving slowly or fidgety/restless - - 0 -   Suicidal thoughts - - 0 -   Difficult doing work/chores - - Not difficult at all -     Interpretation of Total Score  Total Score Depression Severity:  1-4 = Minimal depression, 5-9 = Mild depression, 10-14 = Moderate depression, 15-19 = Moderately severe depression, 20-27 = Severe depression   Psychosocial Evaluation and Intervention:   Psychosocial Re-Evaluation:     Psychosocial Re-Evaluation    [place] Name 03/05/17 1106             Psychosocial Re-Evaluation   Current issues with Current Stress Concerns       Comments Minard has been out on days that councelor has been present.  He is doing well.  He has a strong support system between his wife and kids.  He is almost back to normal at work.  His mom has been transferred to [place] and should be going home soon.  That has been his biggest stress and he is excited about her recovery.  He is just letting things slide off his shoulders and not worring as much.   He has been sleeping good when he is able to sleep.  Currently just trying to figure out his schedule to allow for exercise and work.  He now has his daugthter home with him for the summer.       Expected Outcomes Short: Continue to work on staying [place] about his mom.   Long: Positive overall.          Psychosocial Discharge (Final Psychosocial Re-Evaluation):     Psychosocial Re-Evaluation - 03/05/17 1106      Psychosocial Re-Evaluation   Current issues with Current Stress Concerns   Comments Trindon has been out on days that councelor has been present.  He is doing well.  He has a strong support system between his wife and kids.  He is almost back to normal at work.  His mom has been transferred to [place] and should be going home soon.  That has been his biggest stress and he is excited about her recovery.  He is just letting things slide off his shoulders and not worring as much.   He has been sleeping good when he is able to sleep.  Currently just trying to figure out his schedule to allow for exercise and work.  He now has his daugthter home with him for the summer.   Expected Outcomes Short: Continue to work on staying [place] about his mom.  Long: Positive overall.      Vocational Rehabilitation: Provide vocational rehab assistance to qualifying candidates.   Vocational Rehab Evaluation & Intervention:     Vocational Rehab - 01/14/17 1302      Initial Vocational Rehab Evaluation & Intervention   Assessment shows need for Vocational Rehabilitation No      Education: Education Goals: Education classes will be provided on a weekly basis, covering required topics. Participant will  state understanding/return demonstration of topics presented.  Learning Barriers/Preferences:     Learning Barriers/Preferences - 01/14/17 1301      Learning Barriers/Preferences   Learning Barriers None   Learning Preferences Skilled Demonstration      Education Topics: General Nutrition Guidelines/Fats and Fiber: -Group instruction provided by verbal, written material, models and posters to present the general guidelines for heart healthy nutrition. Gives an explanation and review of dietary fats and fiber.   Controlling Sodium/Reading Food  Labels: -Group verbal and written material supporting the discussion of sodium use in heart healthy nutrition. Review and explanation with models, verbal and written materials for utilization of the food label.   Cardiac Rehab from 04/04/2017 in [facility] Cardiac and Pulmonary Rehab  Date  01/29/17  Educator  PI  Instruction Review Code  2- meets goals/outcomes      Exercise Physiology & Risk Factors: - Group verbal and written instruction with models to review the exercise physiology of the cardiovascular system and associated critical values. Details cardiovascular disease risk factors and the goals associated with each risk factor.   Aerobic Exercise & Resistance Training: - Gives group verbal and written discussion on the health impact of inactivity. On the components of aerobic and resistive training programs and the benefits of this training and how to safely progress through these programs.   Cardiac Rehab from 04/04/2017 in [facility] Cardiac and Pulmonary Rehab  Date  04/04/17  Educator  AS  Instruction Review Code  2- meets goals/outcomes      Flexibility, Balance, General Exercise Guidelines: - Provides group verbal and written instruction on the benefits of flexibility and balance training programs. Provides general exercise guidelines with specific guidelines to those with heart or lung disease. Demonstration and skill practice provided.   Stress Management: - Provides group verbal and written instruction about the health risks of elevated stress, cause of high stress, and healthy ways to reduce stress.   Depression: - Provides group verbal and written instruction on the correlation between heart/lung disease and depressed mood, treatment options, and the stigmas associated with seeking treatment.   Cardiac Rehab from 04/04/2017 in [facility] Cardiac and Pulmonary Rehab  Date  01/31/17  Educator  [facility]  Instruction Review Code  2- meets goals/outcomes      Anatomy & Physiology of the  Heart: - Group verbal and written instruction and models provide basic cardiac anatomy and physiology, with the coronary electrical and arterial systems. Review of: AMI, Angina, Valve disease, Heart Failure, Cardiac Arrhythmia, Pacemakers, and the ICD.   Cardiac Rehab from 04/04/2017 in [facility] Cardiac and Pulmonary Rehab  Date  01/24/17  Educator  [place]  Instruction Review Code  2- meets goals/outcomes      Cardiac Procedures: - Group verbal and written instruction and models to describe the testing methods done to diagnose heart disease. Reviews the outcomes of the test results. Describes the treatment choices: Medical Management, Angioplasty, or Coronary Bypass Surgery.   Cardiac Rehab from 04/04/2017 in [facility] Cardiac and Pulmonary Rehab  Date  02/26/17  Educator  SB  Instruction Review Code  2- meets goals/outcomes      Cardiac Medications: - Group verbal and written instruction to review commonly prescribed medications for heart disease. Reviews the medication, class of the drug, and side effects. Includes the steps to properly store meds and maintain the prescription regimen.   Cardiac Rehab from 04/04/2017 in [facility] Cardiac and Pulmonary Rehab  Date  03/05/17 [6/12 part 1 6/14 Part Two]  Educator  sb  Instruction Review Code  2- meets goals/outcomes      Go Sex-Intimacy & Heart Disease, Get SMART - Goal Setting: - Group verbal and written instruction through game format to discuss heart disease and the return to sexual intimacy. Provides group verbal and written material to discuss and apply goal setting through the application of the S.M.A.R.T. Method.   Cardiac Rehab from 04/04/2017 in [facility] Cardiac and Pulmonary Rehab  Date  02/26/17  Educator  SB  Instruction Review Code  2- meets goals/outcomes      Other Matters of the Heart: - Provides group verbal, written materials and models to describe Heart Failure, Angina, Valve Disease, and Diabetes in the realm of heart disease. Includes  description of the disease process and treatment options available to the cardiac patient.   Cardiac Rehab from 04/04/2017 in [facility] Cardiac and Pulmonary Rehab  Date  01/24/17  Educator  KS  Instruction Review Code  2- meets goals/outcomes      Exercise & Equipment Safety: - Individual verbal instruction and demonstration of equipment use and safety with use of the equipment.   Cardiac Rehab from 04/04/2017 in [facility] Cardiac and Pulmonary Rehab  Date  01/14/17  Educator  C. Enterkin,RN  Instruction Review Code  1- partially meets, needs review/practice      Infection Prevention: - Provides verbal and written material to individual with discussion of infection control including proper hand washing and proper equipment cleaning during exercise session.   Cardiac Rehab from 04/04/2017 in [facility] Cardiac and Pulmonary Rehab  Date  01/14/17  Educator  C. Enterkin, RN  Instruction Review Code  2- meets goals/outcomes      Falls Prevention: - Provides verbal and written material to individual with discussion of falls prevention and safety.   Cardiac Rehab from 04/04/2017 in [facility] Cardiac and Pulmonary Rehab  Date  01/14/17  Educator  C. ENterkinRN  Instruction Review Code  1- partially meets, needs review/practice      Diabetes: - Individual verbal and written instruction to review signs/symptoms of diabetes, desired ranges of glucose level fasting, after meals and with exercise. Advice that pre and post exercise glucose checks will be done for 3 sessions at entry of program.   Cardiac Rehab from 04/04/2017 in [facility] Cardiac and Pulmonary Rehab  Date  01/14/17  Educator  C. Ugashik  Instruction Review Code  1- partially meets, needs review/practice       Knowledge Questionnaire Score:     Knowledge Questionnaire Score - 01/14/17 1310      Knowledge Questionnaire Score   Pre Score 10/28      Core Components/Risk Factors/Patient Goals at Admission:     Personal Goals and Risk  Factors at Admission - 01/14/17 1311      Core Components/Risk Factors/Patient Goals on Admission    Weight Management Yes;Weight Maintenance   Intervention Weight Management: Develop a combined nutrition and exercise program designed to reach desired caloric intake, while maintaining appropriate intake of nutrient and fiber, sodium and fats, and appropriate energy expenditure required for the weight goal.;Weight Management: Provide education and appropriate resources to help participant work on and attain dietary goals.   Admit Weight 183 lb 4.8 oz (83.1 kg)   Expected Outcomes Short Term: Continue to assess and modify interventions until short term weight is achieved;Long Term: Adherence to nutrition and physical activity/exercise program aimed toward attainment of established weight goal;Weight Maintenance: Understanding of the daily nutrition guidelines, which includes 25-35% calories from fat, 7% or  less cal from saturated fats, less than 200mg  cholesterol, less than 1.5gm of sodium, & 5 or more servings of fruits and vegetables daily   Diabetes Yes   Intervention Provide education about signs/symptoms and action to take for hypo/hyperglycemia.;Provide education about proper nutrition, including hydration, and aerobic/resistive exercise prescription along with prescribed medications to achieve blood glucose in normal ranges: Fasting glucose 65-99 mg/dL   Expected Outcomes Short Term: Participant verbalizes understanding of the signs/symptoms and immediate care of hyper/hypoglycemia, proper foot care and importance of medication, aerobic/resistive exercise and nutrition plan for blood glucose control.;Long Term: Attainment of HbA1C < 7%.   Heart Failure Yes   Intervention Provide a combined exercise and nutrition program that is supplemented with education, support and counseling about heart failure. Directed toward relieving symptoms such as shortness of breath, decreased exercise tolerance, and  extremity edema.   Expected Outcomes Improve functional capacity of life;Short term: Attendance in program 2-3 days a week with increased exercise capacity. Reported lower sodium intake. Reported increased fruit and vegetable intake. Reports medication compliance.;Short term: Daily weights obtained and reported for increase. Utilizing diuretic protocols set by physician.;Long term: Adoption of self-care skills and reduction of barriers for early signs and symptoms recognition and intervention leading to self-care maintenance.   Stress Yes  His Mother was in the hospital same time he was   Intervention Offer individual and/or small group education and counseling on adjustment to heart disease, stress management and health-related lifestyle change. Teach and support self-help strategies.;Refer participants experiencing significant psychosocial distress to appropriate mental health specialists for further evaluation and treatment. When possible, include family members and significant others in education/counseling sessions.   Expected Outcomes Short Term: Participant demonstrates changes in health-related behavior, relaxation and other stress management skills, ability to obtain effective social support, and compliance with psychotropic medications if prescribed.;Long Term: Emotional wellbeing is indicated by absence of clinically significant psychosocial distress or social isolation.      Core Components/Risk Factors/Patient Goals Review:      Goals and Risk Factor Review    Row Name 03/05/17 1037 04/04/17 1044           Core Components/Risk Factors/Patient Goals Review   Personal Goals Review Weight Management/Obesity;Heart Failure;Hypertension;Lipids;Diabetes Stress;Diabetes;Hypertension      Review Laterrian has been doing well in rehab.  His weight has been pretty steady.  He does weigh daily and has not had any symptoms of heart failure.  His blood sugars have been around 112 mg/dl and checks  daily.  He does not check his blood pressure at home but it has been good while he is here.  Eston has not had any problems with his medications and feel they are working for him.   Tavin states his numbers for FBG and BP have been stable.  His stress is reduced as he is not working as much overtime and he feel she can attend [employer] regularly.        Expected Outcomes Short: Continue to stay on top of weight and heart failure.  Long: Continue to work on risk factor modifications. Short - Sahan will be able attend class regularly and graduate.  Long - Mell will maintain exercise and stress management after graduation.         Core Components/Risk Factors/Patient Goals at Discharge (Final Review):      Goals and Risk Factor Review - 04/04/17 1044      Core Components/Risk Factors/Patient Goals Review   Personal Goals Review Stress;Diabetes;Hypertension   Review  Laramie states his numbers for FBG and BP have been stable.  His stress is reduced as he is not working as much overtime and he feel she can attend [employer] regularly.     Expected Outcomes Short - Omeed will be able attend class regularly and graduate.  Long - Tyreese will maintain exercise and stress management after graduation.      ITP Comments:     ITP Comments    Row Name 01/14/17 1123 01/16/17 0619 02/13/17 0901 02/19/17 1443 03/13/17 0539   ITP Comments ITP Created during Medical review Documentation DX of 12/19/2016 [place] Care Note 30 day review. Continue with ITP unless directed changes per Medical Director review   New to program has attended medical review 30 day review. Continue with ITP unless directed changes per Medical Director review  new to program  Called to check on status of return.  Fernand called out last week with cold and yesterday for same reason.  Left message on machine. 30 day review. Continue with ITP unless directed changes per Medical Director review   Row Name 03/19/17 1415 04/08/17  1642 04/10/17 0645       ITP Comments Attempted to check on status. Unable to recieve calls at this time. Osualdo called to let us know that he was just leaving the emergency room after pulling back muscle and getting a cortisone injection.  He will be out tomorrow and will miss Thursday as well due to a doctor's appointment.  He hopes to return next week.  30 day review. Continue with ITP unless directed changes per Medical Director review           Comments:

## 2017-04-11 NOTE — Telephone Encounter (Signed)
He was not seen at an ER in our system. He was seen in the office for other issues soon after this note was taken.

## 2017-04-14 ENCOUNTER — Emergency Department: Payer: 59

## 2017-04-14 ENCOUNTER — Emergency Department
Admission: EM | Admit: 2017-04-14 | Discharge: 2017-04-14 | Disposition: A | Discharge status: Home / Self Care | Payer: 59 | Attending: Emergency Medicine | Admitting: Emergency Medicine

## 2017-04-14 DIAGNOSIS — Z79899 Other long term (current) drug therapy: Secondary | ICD-10-CM | POA: Insufficient documentation

## 2017-04-14 DIAGNOSIS — E119 Type 2 diabetes mellitus without complications: Secondary | ICD-10-CM | POA: Diagnosis not present

## 2017-04-14 DIAGNOSIS — M19012 Primary osteoarthritis, left shoulder: Secondary | ICD-10-CM | POA: Diagnosis not present

## 2017-04-14 DIAGNOSIS — M7542 Impingement syndrome of left shoulder: Secondary | ICD-10-CM | POA: Insufficient documentation

## 2017-04-14 DIAGNOSIS — M25512 Pain in left shoulder: Secondary | ICD-10-CM

## 2017-04-14 DIAGNOSIS — Z794 Long term (current) use of insulin: Secondary | ICD-10-CM | POA: Diagnosis not present

## 2017-04-14 DIAGNOSIS — I2510 Atherosclerotic heart disease of native coronary artery without angina pectoris: Secondary | ICD-10-CM | POA: Insufficient documentation

## 2017-04-14 DIAGNOSIS — I5022 Chronic systolic (congestive) heart failure: Secondary | ICD-10-CM | POA: Insufficient documentation

## 2017-04-14 DIAGNOSIS — I252 Old myocardial infarction: Secondary | ICD-10-CM | POA: Diagnosis not present

## 2017-04-14 DIAGNOSIS — Z7984 Long term (current) use of oral hypoglycemic drugs: Secondary | ICD-10-CM | POA: Diagnosis not present

## 2017-04-14 DIAGNOSIS — I110 Hypertensive heart disease with heart failure: Secondary | ICD-10-CM | POA: Diagnosis not present

## 2017-04-14 MED ORDER — CYCLOBENZAPRINE HCL 5 MG PO TABS: 5.0000 mg | ORAL_TABLET | Freq: Three times a day (TID) | ORAL | 0 refills | Status: DC | PRN

## 2017-04-14 MED ORDER — HYDROCODONE-ACETAMINOPHEN 5-325 MG PO TABS: 1.0000 | ORAL_TABLET | Freq: Three times a day (TID) | ORAL | 0 refills | Status: DC | PRN

## 2017-04-14 MED ORDER — PREDNISONE 10 MG PO TABS: 10.0000 mg | ORAL_TABLET | Freq: Two times a day (BID) | ORAL | 0 refills | Status: DC

## 2017-04-14 MED ORDER — CYCLOBENZAPRINE HCL 10 MG PO TABS
10.0000 mg | ORAL_TABLET | Freq: Once | ORAL | Status: AC
  Administered 2017-04-14: 10 mg via ORAL
  Filled 2017-04-14: qty 1

## 2017-04-14 MED ORDER — DEXAMETHASONE SODIUM PHOSPHATE 10 MG/ML IJ SOLN
10.0000 mg | Freq: Once | INTRAMUSCULAR | Status: AC
  Administered 2017-04-14: 10 mg via INTRAMUSCULAR
  Filled 2017-04-14: qty 1

## 2017-04-14 NOTE — Discharge Instructions (Signed)
You are being treated for shoulder pain due to degenerative joint disease (DJD) and a bone spur. You should take the medicines as directed and/or as needed. Follow-up with Dr. Mack Guise for further management. Be sure to monitor your blood sugars closely with the use of the oral steroids. Return to the ED or your provider for interim care.

## 2017-04-14 NOTE — ED Provider Notes (Signed)
[facility] Emergency Department Provider Note ____________________________________________  Time seen: 1031  I have reviewed the triage vital signs and the nursing notes.  HISTORY  Chief Complaint  Back Pain  HPI Philip Ponce is a 46 y.o. male returns to the ED for evaluation management of continued left shoulder pain. Patient describes being evaluated last week for left shoulder pain with upper extremities referral. He reports being worked up for acute chest pain due to his history of CHF. He was discharged with a production for pain medicine and muscle relaxant which she dosed in the interim. He describes his symptoms have been well controlled with both medications. He dosed his lastpain medicine this morning. He has been unable to see his provider for follow-up. He describes continued left (dominant) shoulder pain and disability. He notes pain to the posterior shoulder as well as referral down to the elbow. He would say that he has been aware of increased pain with shoulder ROM for the last year. He was treated by his PCP with meloxicam for the same complaint, but suffered a severe GI bleed. He has not been evaluated until the last week with two subsequent ED visits.   Past Medical History:  Diagnosis Date  . Acute blood loss anemia 11/20/2016  . CHF (congestive heart failure) ([place])   . Coronary artery disease   . Diabetes mellitus type I ([place])   . Slipped intervertebral disc    L4 L5  . STEMI (ST elevation myocardial infarction) [facility])     Patient Active Problem List   Diagnosis Date Noted  . Chronic systolic heart failure ([place]) 12/18/2016  . NICM (nonischemic cardiomyopathy) ([place]) 12/04/2016  . Iron deficiency anemia   . History of bariatric surgery 11/20/2016  . Toenail fungus 10/14/2015  . RBBB 04/07/2015  . LBBB (left bundle branch block) 09/24/2013  . Obstructive apnea 09/24/2013  . GERD 09/29/2010  . 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 Essential hypertension, benign 07/09/2007  . HYPERCHOLESTEROLEMIA, PURE 06/12/2007  . Diabetes mellitus with no complication ([place]) 84/16/6063    Past Surgical History:  Procedure Laterality Date  . COLONOSCOPY WITH PROPOFOL N/A 12/03/2016   Procedure: COLONOSCOPY WITH PROPOFOL;  Surgeon: [name], MD;  Location: [facility] ENDOSCOPY;  Service: Endoscopy;  Laterality: N/A;  . ESOPHAGOGASTRODUODENOSCOPY (EGD) WITH PROPOFOL N/A 12/03/2016   Procedure: ESOPHAGOGASTRODUODENOSCOPY (EGD) WITH PROPOFOL;  Surgeon: [name], MD;  Location: [facility] ENDOSCOPY;  Service: Endoscopy;  Laterality: N/A;  . GASTRIC BYPASS    . GIVENS CAPSULE STUDY N/A 01/07/2017   Procedure: GIVENS CAPSULE STUDY;  Surgeon: [name], MD;  Location: [facility] ENDOSCOPY;  Service: Endoscopy;  Laterality: N/A;  . LEFT HEART CATH AND CORONARY ANGIOGRAPHY N/A 12/04/2016   Procedure: Left Heart Cath and Coronary Angiography;  Surgeon: [name], MD;  Location: [facility] CV LAB;  Service: Cardiovascular;  Laterality: N/A;    Prior to Admission medications   Medication Sig Start Date End Date Taking? Authorizing Provider  calcium carbonate (OS-CAL) 600 MG TABS tablet Take 600 mg by mouth 2 (two) times daily with a meal.    [provider]  carvedilol (COREG) 6.25 MG tablet Take 1 tablet (6.25 mg total) by mouth 2 (two) times daily. 02/21/17   End, Harrell Gave, MD  cyclobenzaprine (FLEXERIL) 5 MG tablet Take 1 tablet (5 mg total) by mouth 3 (three) times daily as needed for muscle spasms. 04/14/17   Jhana Giarratano, Dannielle Karvonen, PA-C  furosemide (LASIX) 20 MG tablet Take 1 tablet (20  mg total) by mouth daily as needed. Take for > 2lb weight gain in 1 day or > 5lb weight gain in 1 week. 12/19/16 03/22/17  End, Harrell Gave, MD  Garlic 856 MG TABS Take 100 mg by mouth daily.    [provider]  glucose blood (ONE TOUCH ULTRA TEST) test strip Check blood sugar once daily and as instructed. Dx E11.9 01/19/16   [name], MD   HYDROcodone-acetaminophen (NORCO) 5-325 MG tablet Take 1 tablet by mouth 3 (three) times daily as needed. 04/14/17   Lazariah Savard, Dannielle Karvonen, PA-C  Insulin Glargine (TOUJEO SOLOSTAR) 300 UNIT/ML SOPN Inject 10 Units into the skin daily. Patient taking differently: Inject 15 Units into the skin daily.  12/27/16   Bedsole, Amy E, MD  metFORMIN (GLUCOPHAGE-XR) 500 MG 24 hr tablet Take 4 tablets (2,000 mg total) by mouth daily with breakfast. Patient taking differently: Take 1,000 mg by mouth 2 (two) times daily.  12/11/16   Bedsole, Amy E, MD  methocarbamol (ROBAXIN) 500 MG tablet Take 2 tablets (1,000 mg total) by mouth every 6 (six) hours as needed for muscle spasms. 04/08/17   Johnn Hai, PA-C  Multiple Vitamins-Minerals (MENS MULTI VITAMIN & MINERAL PO) Take 2 tablets by mouth daily. Men's Vita Fusion    [provider]  Omega-3 Fatty Acids (FISH OIL) 1200 MG CAPS Take 1 capsule by mouth daily.    [provider]  omeprazole (PRILOSEC) 40 MG capsule Take 1 capsule (40 mg total) by mouth daily. 11/23/16 03/22/17  Tonia Ghent, MD  predniSONE (DELTASONE) 10 MG tablet Take 1 tablet (10 mg total) by mouth 2 (two) times daily with a meal. 04/14/17   Ekta Dancer, Dannielle Karvonen, PA-C  pseudoephedrine-guaifenesin (TUSSIN PE) 30-100 MG/5ML SYRP Take 5 mLs by mouth every 4 (four) hours as needed.    [provider]  sacubitril-valsartan (ENTRESTO) 49-51 MG Take 1 tablet by mouth 2 (two) times daily. 02/15/17   Alisa Graff, FNP    Allergies Mobic [meloxicam] and Diclofenac  Family History  Problem Relation Age of Onset  . Diabetes Mother   . Hypertension Mother   . Cancer Mother        breast  . Diabetes Father   . Hypertension Father   . Cancer Father        colon  . Cancer Maternal Grandfather        prostate  . Stroke Maternal Grandfather        CVA    Social History Social History  Substance Use Topics  . Smoking status: Never Smoker  . Smokeless tobacco:  Never Used  . Alcohol use No    Review of Systems  Constitutional: Negative for fever. Cardiovascular: Negative for chest pain. Respiratory: Negative for shortness of breath. Musculoskeletal: Negative for back pain. Left shoulder pain as above Skin: Negative for rash. Neurological: Negative for headaches, focal weakness or numbness. ____________________________________________  PHYSICAL EXAM:  VITAL SIGNS: ED Triage Vitals  Enc Vitals Group     BP 04/14/17 1006 (!) 135/91     Pulse Rate 04/14/17 1006 84     Resp 04/14/17 1006 20     Temp 04/14/17 1006 98.7 F (37.1 C)     Temp Source 04/14/17 1006 Oral     SpO2 04/14/17 1006 100 %     Weight 04/14/17 1006 189 lb (85.7 kg)     Height 04/14/17 1006 6\' 1"  (1.854 m)     Head Circumference --  Peak Flow --      Pain Score 04/14/17 1005 5     Pain Loc --      Pain Edu? --      Excl. in [place]? --     Constitutional: Alert and oriented. Well appearing and in no distress. Head: Normocephalic and atraumatic. Neck: Supple. No thyromegaly. Range of motion without crepitus. Mildly tender to palpation to the left upper trapezius. Cardiovascular: Normal rate, regular rhythm. Normal distal pulses. Respiratory: Normal respiratory effort. No wheezes/rales/rhonchi. Gastrointestinal: Soft and nontender. No distention. Musculoskeletal: Left shoulder without any obvious deformity, dislocation, or sulcus sign. Patient is mildly tender to palpation over the posterior shoulder girdle at the supraspinatus and infraspinatus musculature. Self-limited range of motion with extension and abduction. Normal rotator cuff testing without signs of deficit. Normal composite fist bilaterally.  Nontender with normal range of motion in all other extremities.  Neurologic:  CN II-XII grossly intact. Normal UE DTRs bilaterally. Normal gait without ataxia. Normal speech and language. No gross focal neurologic deficits are appreciated. Skin:  Skin is warm, dry and  intact. No rash noted. ____________________________________________   RADIOLOGY  Left Shoulder  IMPRESSION: No acute findings. Mild degenerate changes.  I, Luis Nickles, Dannielle Karvonen, personally viewed and evaluated these images (plain radiographs) as part of my medical decision making, as well as reviewing the written report by the radiologist.  By my assessment, patient has a large subacromial bone spur, likely causing his impingement symptoms.  ____________________________________________  PROCEDURES  Decadron 10 mg IM Flexeril 10 mg PO ____________________________________________  INITIAL IMPRESSION / ASSESSMENT AND PLAN / ED COURSE  Patient with left shoulder pain due to impingement. His x-ray shows a large subacromial spur. He will be referred to ortho for definitve management. He is discharged with prescriptions for prednisone, cyclobenzaprine, and hydrocodone. He is advised to closely monitor and appropriately treat his blood sugars for the next week. He should monitor his body mechanics and apply ice to the shoulder as needed.  ____________________________________________  FINAL CLINICAL IMPRESSION(S) / ED DIAGNOSES  Final diagnoses:  Acute pain of left shoulder  Primary osteoarthritis of left shoulder      Carmie End, Dannielle Karvonen, PA-C 04/14/17 1710    Lisa Roca, MD 04/16/17 [phone]

## 2017-04-14 NOTE — ED Triage Notes (Addendum)
First Nurse Note: Pt states that he was seen last week for left shoulder/upper back pain. Pt states that the pain has not gotten any better. Pt is c/o pain in the left upper back that radiates down his arm and into his chest. Pt states that he has a hx/o CHF  Pt denies shortness of breath, N/V. Pt does not appear to be in distress at this time.

## 2017-04-14 NOTE — ED Triage Notes (Signed)
Pt reports left shoulder and back pain came a wk ago to ER for same reports out of medication pain still the same has not had time to follow up with PCP. Pt reports pulled a muscle while lying on a pillow

## 2017-04-16 ENCOUNTER — Encounter: Payer: Self-pay | Admitting: *Deleted

## 2017-04-16 DIAGNOSIS — I213 ST elevation (STEMI) myocardial infarction of unspecified site: Secondary | ICD-10-CM

## 2017-04-16 DIAGNOSIS — I5022 Chronic systolic (congestive) heart failure: Secondary | ICD-10-CM

## 2017-04-17 ENCOUNTER — Ambulatory Visit: Payer: 59 | Admitting: Internal Medicine

## 2017-04-18 ENCOUNTER — Encounter: Payer: Self-pay | Admitting: Internal Medicine

## 2017-04-19 ENCOUNTER — Encounter: Payer: Self-pay | Admitting: Family

## 2017-04-22 DIAGNOSIS — M79602 Pain in left arm: Secondary | ICD-10-CM | POA: Diagnosis not present

## 2017-04-22 DIAGNOSIS — M7582 Other shoulder lesions, left shoulder: Secondary | ICD-10-CM | POA: Diagnosis not present

## 2017-04-22 DIAGNOSIS — M5412 Radiculopathy, cervical region: Secondary | ICD-10-CM | POA: Diagnosis not present

## 2017-04-25 ENCOUNTER — Encounter: Payer: 59 | Attending: Internal Medicine

## 2017-04-25 DIAGNOSIS — I10 Essential (primary) hypertension: Secondary | ICD-10-CM | POA: Insufficient documentation

## 2017-04-25 DIAGNOSIS — I5022 Chronic systolic (congestive) heart failure: Secondary | ICD-10-CM | POA: Insufficient documentation

## 2017-05-02 ENCOUNTER — Encounter: Payer: Self-pay | Admitting: Podiatry

## 2017-05-02 ENCOUNTER — Encounter: Payer: Self-pay | Admitting: *Deleted

## 2017-05-02 ENCOUNTER — Ambulatory Visit (INDEPENDENT_AMBULATORY_CARE_PROVIDER_SITE_OTHER): Payer: 59 | Admitting: Podiatry

## 2017-05-02 ENCOUNTER — Telehealth: Payer: Self-pay | Admitting: *Deleted

## 2017-05-02 ENCOUNTER — Other Ambulatory Visit: Payer: Self-pay | Admitting: Internal Medicine

## 2017-05-02 DIAGNOSIS — B351 Tinea unguium: Secondary | ICD-10-CM | POA: Diagnosis not present

## 2017-05-02 DIAGNOSIS — I213 ST elevation (STEMI) myocardial infarction of unspecified site: Secondary | ICD-10-CM

## 2017-05-02 DIAGNOSIS — E119 Type 2 diabetes mellitus without complications: Secondary | ICD-10-CM

## 2017-05-02 DIAGNOSIS — I5022 Chronic systolic (congestive) heart failure: Secondary | ICD-10-CM

## 2017-05-02 DIAGNOSIS — M79676 Pain in unspecified toe(s): Secondary | ICD-10-CM

## 2017-05-02 DIAGNOSIS — I509 Heart failure, unspecified: Secondary | ICD-10-CM

## 2017-05-02 DIAGNOSIS — R9430 Abnormal result of cardiovascular function study, unspecified: Secondary | ICD-10-CM

## 2017-05-02 NOTE — Telephone Encounter (Signed)
Called to check on status of return.  Left message on voicemail. 

## 2017-05-02 NOTE — Progress Notes (Signed)
Complaint:  Visit Type: Patient returns to my office for continued preventative foot care services. Complaint: Patient states" my nails have grown long and thick and become painful to walk and wear shoes" Patient has been diagnosed with DM with no foot complications. The patient presents for preventative foot care services. No changes to ROS  Podiatric Exam: Vascular: dorsalis pedis and posterior tibial pulses are palpable bilateral. Capillary return is immediate. Temperature gradient is WNL. Skin turgor WNL  Sensorium: Normal Semmes Weinstein monofilament test. Normal tactile sensation bilaterally. Nail Exam: Pt has thick disfigured discolored nails with subungual debris noted bilateral entire nail hallux through fifth toenails Ulcer Exam: There is no evidence of ulcer or pre-ulcerative changes or infection. Orthopedic Exam: Muscle tone and strength are WNL. No limitations in general ROM. No crepitus or effusions noted. Foot type and digits show no abnormalities. Bony prominences are unremarkable. HAV  B/L.  Pes planus.  Skin: No Porokeratosis. No infection or ulcers  Diagnosis:  Onychomycosis, , Pain in right toe, pain in left toes  Treatment & Plan Procedures and Treatment: Consent by patient was obtained for treatment procedures. The patient understood the discussion of treatment and procedures well. All questions were answered thoroughly reviewed. Debridement of mycotic and hypertrophic toenails, 1 through 5 bilateral and clearing of subungual debris. No ulceration, no infection noted.  Return Visit-Office Procedure: Patient instructed to return to the office for a follow up visit 4 months for continued evaluation and treatment.    Gardiner Barefoot DPM

## 2017-05-06 ENCOUNTER — Ambulatory Visit: Payer: 59 | Attending: Family | Admitting: Family

## 2017-05-06 ENCOUNTER — Encounter: Payer: Self-pay | Admitting: Family

## 2017-05-06 VITALS — BP 141/85 | HR 86 | Resp 18 | Ht 73.0 in | Wt 187.0 lb

## 2017-05-06 DIAGNOSIS — I509 Heart failure, unspecified: Secondary | ICD-10-CM | POA: Diagnosis not present

## 2017-05-06 DIAGNOSIS — Z833 Family history of diabetes mellitus: Secondary | ICD-10-CM | POA: Insufficient documentation

## 2017-05-06 DIAGNOSIS — M7582 Other shoulder lesions, left shoulder: Secondary | ICD-10-CM | POA: Insufficient documentation

## 2017-05-06 DIAGNOSIS — I110 Hypertensive heart disease with heart failure: Secondary | ICD-10-CM | POA: Insufficient documentation

## 2017-05-06 DIAGNOSIS — Z9889 Other specified postprocedural states: Secondary | ICD-10-CM | POA: Diagnosis not present

## 2017-05-06 DIAGNOSIS — E119 Type 2 diabetes mellitus without complications: Secondary | ICD-10-CM | POA: Insufficient documentation

## 2017-05-06 DIAGNOSIS — Z9884 Bariatric surgery status: Secondary | ICD-10-CM | POA: Insufficient documentation

## 2017-05-06 DIAGNOSIS — Z823 Family history of stroke: Secondary | ICD-10-CM | POA: Diagnosis not present

## 2017-05-06 DIAGNOSIS — Z79899 Other long term (current) drug therapy: Secondary | ICD-10-CM | POA: Diagnosis not present

## 2017-05-06 DIAGNOSIS — M25512 Pain in left shoulder: Secondary | ICD-10-CM | POA: Insufficient documentation

## 2017-05-06 DIAGNOSIS — Z888 Allergy status to other drugs, medicaments and biological substances status: Secondary | ICD-10-CM | POA: Diagnosis not present

## 2017-05-06 DIAGNOSIS — I5022 Chronic systolic (congestive) heart failure: Secondary | ICD-10-CM

## 2017-05-06 DIAGNOSIS — Z809 Family history of malignant neoplasm, unspecified: Secondary | ICD-10-CM | POA: Insufficient documentation

## 2017-05-06 DIAGNOSIS — I252 Old myocardial infarction: Secondary | ICD-10-CM | POA: Insufficient documentation

## 2017-05-06 DIAGNOSIS — Z794 Long term (current) use of insulin: Secondary | ICD-10-CM | POA: Insufficient documentation

## 2017-05-06 DIAGNOSIS — I10 Essential (primary) hypertension: Secondary | ICD-10-CM

## 2017-05-06 DIAGNOSIS — Z79891 Long term (current) use of opiate analgesic: Secondary | ICD-10-CM | POA: Diagnosis not present

## 2017-05-06 NOTE — Patient Instructions (Signed)
Continue weighing daily and call for an overnight weight gain of > 2 pounds or a weekly weight gain of >5 pounds. 

## 2017-05-06 NOTE — Progress Notes (Signed)
Patient ID: Philip Ponce, male    DOB: [birth year], 46 y.o.   MRN: [id number]  HPI  Philip Ponce is a 48 yoM with PMH significant for DM, iron deficiency, HTN, gastric bypass, STEMI and CHF with reduced ejection fraction.  Last echo was done 12/05/16 and showed an EF of 20-25% with mild Philip. Echo on 04/14/15 showed EF of 50-55%.  Was in the ED 04/14/17 due to left shoulder pain. Was treated and released. Was in the ED 04/08/17 due to left shoulder pain. Treated and released. Last admission was 12/04/16 for chest pain. Workup for STEMI and cath showed no significant CAD. Pt also was experiencing symptoms of CHF and was treated with IV furosemide.   Presents today for a follow-up visit with a chief complaint of left shoulder pain. Says that it's been present for the last 6-8 weeks and continues to hurt him consistently every day. Describes the pain as a burning down the left arm into the left elbow. Has tried injection with minimal relief and is supposed to begin physical therapy. Denies any numbness and isn't having any pain in his right shoulder. No injury that he's aware of. Does have sleep difficulty associated with this because of the pain.   Past Medical History:  Diagnosis Date  . Acute blood loss anemia 11/20/2016  . CHF (congestive heart failure) ([place])   . Coronary artery disease   . Diabetes mellitus type I ([place])   . Slipped intervertebral disc    L4 L5  . STEMI (ST elevation myocardial infarction) [facility])    Past Surgical History:  Procedure Laterality Date  . COLONOSCOPY WITH PROPOFOL N/A 12/03/2016   Procedure: COLONOSCOPY WITH PROPOFOL;  Surgeon: [name], MD;  Location: [facility] ENDOSCOPY;  Service: Endoscopy;  Laterality: N/A;  . ESOPHAGOGASTRODUODENOSCOPY (EGD) WITH PROPOFOL N/A 12/03/2016   Procedure: ESOPHAGOGASTRODUODENOSCOPY (EGD) WITH PROPOFOL;  Surgeon: [name], MD;  Location: [facility] ENDOSCOPY;  Service: Endoscopy;  Laterality: N/A;  . GASTRIC BYPASS    . GIVENS CAPSULE  STUDY N/A 01/07/2017   Procedure: GIVENS CAPSULE STUDY;  Surgeon: [name], MD;  Location: [facility] ENDOSCOPY;  Service: Endoscopy;  Laterality: N/A;  . LEFT HEART CATH AND CORONARY ANGIOGRAPHY N/A 12/04/2016   Procedure: Left Heart Cath and Coronary Angiography;  Surgeon: [name], MD;  Location: [place] CV LAB;  Service: Cardiovascular;  Laterality: N/A;   Family History  Problem Relation Age of Onset  . Diabetes Mother   . Hypertension Mother   . Cancer Mother        breast  . Diabetes Father   . Hypertension Father   . Cancer Father        colon  . Cancer Maternal Grandfather        prostate  . Stroke Maternal Grandfather        CVA   Social History  Substance Use Topics  . Smoking status: Never Smoker  . Smokeless tobacco: Never Used  . Alcohol use No   Allergies  Allergen Reactions  . Mobic [Meloxicam] Other (See Comments)    Ulcers in stomach eruption   . Diclofenac Other (See Comments)    Fast heart beat,, CP,SOB and weakness on one side.   Prior to Admission medications   Medication Sig Start Date End Date Taking? Authorizing Provider  calcium carbonate (OS-CAL) 600 MG TABS tablet Take 600 mg by mouth 2 (two) times daily with a meal.   Yes [provider]  carvedilol (COREG) 6.25 MG tablet  Take 1 tablet (6.25 mg total) by mouth 2 (two) times daily. 02/21/17  Yes End, Harrell Gave, MD  furosemide (LASIX) 20 MG tablet Take 20 mg by mouth as needed.   Yes [provider]  Garlic 948 MG TABS Take 100 mg by mouth daily.   Yes [provider]  glucose blood (ONE TOUCH ULTRA TEST) test strip Check blood sugar once daily and as instructed. Dx E11.9 01/19/16  Yes Bedsole, Amy E, MD  Insulin Glargine (TOUJEO SOLOSTAR) 300 UNIT/ML SOPN Inject 15 Units into the skin daily.   Yes [provider]  metFORMIN (GLUCOPHAGE) 1000 MG tablet Take 1,000 mg by mouth 2 (two) times daily with a meal.   Yes [provider]  Multiple  Vitamins-Minerals (MENS MULTI VITAMIN & MINERAL PO) Take 2 tablets by mouth daily. Men's Vita Fusion   Yes [provider]  Naproxen Sodium (ALEVE) 220 MG CAPS Take 2 capsules by mouth as needed.   Yes [provider]  Omega-3 Fatty Acids (FISH OIL) 1200 MG CAPS Take 1 capsule by mouth daily.   Yes [provider]  omeprazole (PRILOSEC) 40 MG capsule Take 40 mg by mouth daily.   Yes [provider]  sacubitril-valsartan (ENTRESTO) 49-51 MG Take 1 tablet by mouth 2 (two) times daily. 02/15/17  Yes Arjan Strohm, Otila Kluver A, FNP  traMADol (ULTRAM) 50 MG tablet Take by mouth every 6 (six) hours as needed.   Yes [provider]    Review of Systems  Constitutional: Negative for appetite change and fatigue.  HENT: Negative for congestion, postnasal drip and sore throat.   Eyes: Negative.   Respiratory: Negative for chest tightness, shortness of breath, wheezing and stridor.   Cardiovascular: Negative for chest pain, palpitations and leg swelling.  Gastrointestinal: Negative for abdominal distention and abdominal pain.  Endocrine: Negative.   Genitourinary: Negative.   Musculoskeletal: Positive for arthralgias (left shoulder pain). Negative for back pain and neck pain.  Skin: Negative.   Allergic/Immunologic: Negative.   Neurological: Negative for dizziness and light-headedness.  Hematological: Negative for adenopathy. Does not bruise/bleed easily.  Psychiatric/Behavioral: Positive for sleep disturbance (disrupted sleep pattern due to left shoulder pain). Negative for dysphoric mood. The patient is not nervous/anxious.    Vitals:   05/06/17 0849  BP: (!) 141/85  Pulse: 86  Resp: 18  SpO2: 100%  Weight: 187 lb (84.8 kg)  Height: 6\' 1"  (1.854 m)   Wt Readings from Last 3 Encounters:  05/06/17 187 lb (84.8 kg)  04/14/17 189 lb (85.7 kg)  04/08/17 189 lb (85.7 kg)    Lab Results  Component Value Date   CREATININE 0.98 03/22/2017   CREATININE 0.88  01/14/2017   CREATININE 0.87 12/06/2016    Physical Exam  Constitutional: He is oriented to person, place, and time. He appears well-developed and well-nourished.  HENT:  Head: Normocephalic and atraumatic.  Neck: Normal range of motion. Neck supple. No JVD present.  Cardiovascular: Normal rate and regular rhythm.   Pulmonary/Chest: Effort normal. He has no wheezes. He has no rales.  Abdominal: Soft. He exhibits no distension. There is no tenderness.  Musculoskeletal: He exhibits no edema or tenderness.  Neurological: He is alert and oriented to person, place, and time.  Skin: Skin is warm and dry.  Psychiatric: He has a normal mood and affect. His behavior is normal. Thought content normal.  Nursing note and vitals reviewed.   Assessment & Plan:  1. Congestive heart failure with reduced ejection fraction - NYHA  class I - euvolemic - hasn't been weighing daily and he was encouraged to resume doing so. Reminded to call for weight gain of > 2 lbs overnight or > 5 lbs in one week; by our scale, his weight is down 3 pounds from the last time he was here - not adding salt. Encouraged following < 2000 mg/day sodium intake. Reading food labels for sodium and carbs - has not been able to get back to cardiac rehab due to working overtime as well as dealing with his left shoulder - saw cardiologist (End) 02/21/17 and had carvedilol increased and returns to him 06/04/17 - will defer increasing his entresto/carvedilol today due to he's had multiple medication adjustments related to his left shoulder injury; discussed increasing one of them at his next visit - discussed the importance of balancing work and rest as he continues to work lost of overtime  2. Diabetes - A1c on 11/27/16 was 9.6% - hasn't checked his glucose in a few days; last time he checked it it was in the 120's.  - discussed the importance of checking it daily and writing it down - No lows, discussed s/sx of lows - Saw PCP on  12/27/16 (Bedsole)  3. HTN - BP looked good today 141/85  4: Left rotator cuff tendonitis- - saw orthopaedics 04/22/17 and returns in 4-6 weeks - just recently got his new insurance information and is still waiting on the card - discussed calling to get an appointment regarding physical therapy; discussion of possible surgery and explained that he'll have to go through PT anyway after surgery  Patient did not bring his medications nor a list. Each medication was verbally reviewed with the patient and he was encouraged to bring the bottles to every visit to confirm accuracy of list.  Return in 3 months or sooner for any questions/problems before then.

## 2017-05-08 ENCOUNTER — Encounter: Payer: Self-pay | Admitting: *Deleted

## 2017-05-08 ENCOUNTER — Telehealth: Payer: Self-pay | Admitting: *Deleted

## 2017-05-08 DIAGNOSIS — I5022 Chronic systolic (congestive) heart failure: Secondary | ICD-10-CM

## 2017-05-08 DIAGNOSIS — I213 ST elevation (STEMI) myocardial infarction of unspecified site: Secondary | ICD-10-CM

## 2017-05-08 NOTE — Progress Notes (Signed)
Cardiac Individual Treatment Plan  Patient Details  Name: Philip Ponce MRN: [id number] Date of Birth: Jul 15, 1971 Referring Provider:     Cardiac Rehab from 01/14/2017 in [facility] Cardiac and Pulmonary Rehab  Referring Provider  End, Harrell Gave MD      Initial Encounter Date:    Cardiac Rehab from 01/14/2017 in [facility] Cardiac and Pulmonary Rehab  Date  01/14/17  Referring Provider  End, Harrell Gave MD      Visit Diagnosis: ST elevation myocardial infarction (STEMI), unspecified artery ([place])  Heart failure, chronic systolic ([place])  Patient's Home Medications on Admission:  Current Outpatient Prescriptions:  .  calcium carbonate (OS-CAL) 600 MG TABS tablet, Take 600 mg by mouth 2 (two) times daily with a meal., Disp: , Rfl:  .  carvedilol (COREG) 6.25 MG tablet, Take 1 tablet (6.25 mg total) by mouth 2 (two) times daily., Disp: 180 tablet, Rfl: 3 .  furosemide (LASIX) 20 MG tablet, Take 20 mg by mouth as needed., Disp: , Rfl:  .  Garlic 678 MG TABS, Take 100 mg by mouth daily., Disp: , Rfl:  .  glucose blood (ONE TOUCH ULTRA TEST) test strip, Check blood sugar once daily and as instructed. Dx E11.9, Disp: 100 each, Rfl: 1 .  Insulin Glargine (TOUJEO SOLOSTAR) 300 UNIT/ML SOPN, Inject 15 Units into the skin daily., Disp: , Rfl:  .  metFORMIN (GLUCOPHAGE) 1000 MG tablet, Take 1,000 mg by mouth 2 (two) times daily with a meal., Disp: , Rfl:  .  Multiple Vitamins-Minerals (MENS MULTI VITAMIN & MINERAL PO), Take 2 tablets by mouth daily. Men's Vita Fusion, Disp: , Rfl:  .  Naproxen Sodium (ALEVE) 220 MG CAPS, Take 2 capsules by mouth as needed., Disp: , Rfl:  .  Omega-3 Fatty Acids (FISH OIL) 1200 MG CAPS, Take 1 capsule by mouth daily., Disp: , Rfl:  .  omeprazole (PRILOSEC) 40 MG capsule, Take 40 mg by mouth daily., Disp: , Rfl:  .  sacubitril-valsartan (ENTRESTO) 49-51 MG, Take 1 tablet by mouth 2 (two) times daily., Disp: 60 tablet, Rfl: 5 .  traMADol (ULTRAM) 50 MG tablet, Take by  mouth every 6 (six) hours as needed., Disp: , Rfl:   Past Medical History: Past Medical History:  Diagnosis Date  . Acute blood loss anemia 11/20/2016  . CHF (congestive heart failure) ([place])   . Coronary artery disease   . Diabetes mellitus type I ([place])   . Slipped intervertebral disc    L4 L5  . STEMI (ST elevation myocardial infarction) ([place])     Tobacco Use: History  Smoking Status  . Never Smoker  Smokeless Tobacco  . Never Used    Labs: Recent Review Flowsheet Data    Labs for ITP Cardiac and Pulmonary Rehab Latest Ref Rng & Units 01/01/2013 04/15/2015 10/07/2015 01/03/2016 11/27/2016   Cholestrol 0 - 200 mg/dL - 101 121 - -   LDLCALC 0 - 99 mg/dL - 38 39 - -   HDL >39.00 mg/dL - 51.40 66.70 - -   Trlycerides 0.0 - 149.0 mg/dL - 58.0 78.0 - -   Hemoglobin A1c 4.6 - 6.5 % 8.6(H) 7.0(H) 10.5(H) 10.6(H) 9.6(H)       Exercise Target Goals:    Exercise Program Goal: Individual exercise prescription set with THRR, safety & activity barriers. Participant demonstrates ability to understand and report RPE using BORG scale, to self-measure pulse accurately, and to acknowledge the importance of the exercise prescription.  Exercise Prescription Goal: Starting with aerobic activity 30 plus  minutes a day, 3 days per week for initial exercise prescription. Provide home exercise prescription and guidelines that participant acknowledges understanding prior to discharge.  Activity Barriers & Risk Stratification:     Activity Barriers & Cardiac Risk Stratification - 01/14/17 1311      Activity Barriers & Cardiac Risk Stratification   Activity Barriers Balance Concerns;Decreased Ventricular Function;Deconditioning;Muscular Weakness  he said that sometimes his equilibrium gets off   Cardiac Risk Stratification High      6 Minute Walk:     6 Minute Walk    Row Name 01/14/17 1437         6 Minute Walk   Phase Initial     Distance 1570 feet     Walk Time 6 minutes     # of  Rest Breaks 0     MPH 2.97     METS 5.17     RPE 9     VO2 Peak 18.11     Symptoms No     Resting HR 75 bpm     Resting BP 138/70     Max Ex. HR 112 bpm     Max Ex. BP 146/74     2 Minute Post BP 134/70        Oxygen Initial Assessment:   Oxygen Re-Evaluation:   Oxygen Discharge (Final Oxygen Re-Evaluation):   Initial Exercise Prescription:     Initial Exercise Prescription - 01/14/17 1400      Date of Initial Exercise RX and Referring Provider   Date 01/14/17   Referring Provider End, Harrell Gave MD     Treadmill   MPH 3   Grade 0.5   Minutes 15   METs 3.5     Elliptical   Level 1   Speed 4.5   Minutes 15     T5 Nustep   Level 3   SPM 80   Minutes 15   METs 2     Prescription Details   Frequency (times per week) 2   Duration Progress to 45 minutes of aerobic exercise without signs/symptoms of physical distress     Intensity   THRR 40-80% of Max Heartrate 114-153   Ratings of Perceived Exertion 11-13   Perceived Dyspnea 0-4     Progression   Progression Continue to progress workloads to maintain intensity without signs/symptoms of physical distress.     Resistance Training   Training Prescription Yes   Weight 4 lbs   Reps 10-15      Perform Capillary Blood Glucose checks as needed.  Exercise Prescription Changes:     Exercise Prescription Changes    Row Name 01/14/17 1400 02/06/17 1100 02/07/17 1000 02/21/17 1500 03/07/17 1100     Response to Exercise   Blood Pressure (Admit) 138/70 142/64  - 126/64 110/60   Blood Pressure (Exercise) 146/74 144/76  - 138/74 126/62   Blood Pressure (Exit) 134/70 124/64  - 118/62 124/64   Heart Rate (Admit) 75 bpm 99 bpm  - 99 bpm 99 bpm   Heart Rate (Exercise) 112 bpm 151 bpm  - 120 bpm 135 bpm   Heart Rate (Exit) 89 bpm 88 bpm  - 88 bpm 77 bpm   Oxygen Saturation (Admit) 100 %  -  -  -  -   Oxygen Saturation (Exercise) 100 %  -  -  -  -   Rating of Perceived Exertion (Exercise) 9 13  - 11 14    Symptoms none none none none none  Comments walk test results  -  -  -  -   Duration  - Progress to 45 minutes of aerobic exercise without signs/symptoms of physical distress Progress to 45 minutes of aerobic exercise without signs/symptoms of physical distress Progress to 45 minutes of aerobic exercise without signs/symptoms of physical distress Progress to 45 minutes of aerobic exercise without signs/symptoms of physical distress   Intensity  - THRR unchanged THRR unchanged THRR unchanged THRR unchanged     Progression   Progression  - Continue to progress workloads to maintain intensity without signs/symptoms of physical distress. Continue to progress workloads to maintain intensity without signs/symptoms of physical distress. Continue to progress workloads to maintain intensity without signs/symptoms of physical distress. Continue to progress workloads to maintain intensity without signs/symptoms of physical distress.   Average METs  - 3.4 3.4 3.48 3.6     Resistance Training   Training Prescription  - Yes Yes Yes Yes   Weight  - 4 lbs 4 lbs 4 lbs 5 lbs   Reps  - 10-15 10-15 10-15 10-15     Interval Training   Interval Training  -  -  - No No     Treadmill   MPH  - 3 3 3.3 3.3   Grade  - 0.5 0.5 0.5 3   Minutes  - 15 15 15 15    METs  - 3.5 3.5 3.75 4.89     Elliptical   Level  - 1 1 1 1    Speed  - 4.5 4.5 4.5 4.5   Minutes  - 15 15 15 15      T5 Nustep   Level  - 3 3 3 5    Minutes  - 15 15 15 15    METs  - 3.3 3.3 3.2 2.3     Home Exercise Plan   Plans to continue exercise at  -  - [employer] (comment)  walking and Body by [employer] (comment)  walking and [employer] by [employer] (comment)  walking and Body by [employer]  -  - Add 3 additional days to program exercise sessions. Add 3 additional days to program exercise sessions. Add 3 additional days to program exercise sessions.   Initial Home Exercises Provided  -  - 02/07/17 02/07/17  02/07/17   Row Name 03/19/17 1400 04/04/17 1600           Response to Exercise   Blood Pressure (Admit) 140/78 132/64      Blood Pressure (Exercise) 160/80 158/74      Blood Pressure (Exit) 130/70 120/80      Heart Rate (Admit) 81 bpm 80 bpm      Heart Rate (Exercise) 126 bpm 134 bpm      Heart Rate (Exit) 90 bpm 79 bpm      Rating of Perceived Exertion (Exercise) 13 13      Symptoms none none      Duration Progress to 45 minutes of aerobic exercise without signs/symptoms of physical distress Progress to 45 minutes of aerobic exercise without signs/symptoms of physical distress      Intensity THRR unchanged THRR unchanged        Progression   Progression Continue to progress workloads to maintain intensity without signs/symptoms of physical distress. Continue to progress workloads to maintain intensity without signs/symptoms of physical distress.      Average METs 3.76 2.7        Resistance Training   Training Prescription Yes  Yes      Weight 5 lbs 5 lbs      Reps 10-15 10-15        Interval Training   Interval Training No No        Treadmill   MPH 3.3  -      Grade 3  -      Minutes 15  -      METs 4.89  -        Elliptical   Level  - 1      Speed  - 4.5      Minutes  - 15        T5 Nustep   Level 5 5      Minutes 15 15      METs 2.7 2.7        Home Exercise Plan   Plans to continue exercise at [facility] (comment)  walking and [employer] by [employer]  -      Frequency Add 3 additional days to program exercise sessions.  -      Initial Home Exercises Provided 02/07/17  -         Exercise Comments:     Exercise Comments    Row Name 01/24/17 1049           Exercise Comments First full day of exercise!  Patient was oriented to gym and equipment including functions, settings, policies, and procedures.  Patient's individual exercise prescription and treatment plan were reviewed.  All starting workloads were established based on the results of the 6 minute walk  test done at initial orientation visit.  The plan for exercise progression was also introduced and progression will be customized based on patient's performance and goals.          Exercise Goals and Review:     Exercise Goals    Row Name 01/14/17 1439             Exercise Goals   Increase Physical Activity Yes       Intervention Develop an individualized exercise prescription for aerobic and resistive training based on initial evaluation findings, risk stratification, comorbidities and participant's personal goals.;Provide advice, education, support and counseling about physical activity/exercise needs.       Expected Outcomes Achievement of increased cardiorespiratory fitness and enhanced flexibility, muscular endurance and strength shown through measurements of functional capacity and personal statement of participant.       Increase Strength and Stamina Yes       Intervention Provide advice, education, support and counseling about physical activity/exercise needs.;Develop an individualized exercise prescription for aerobic and resistive training based on initial evaluation findings, risk stratification, comorbidities and participant's personal goals.       Expected Outcomes Achievement of increased cardiorespiratory fitness and enhanced flexibility, muscular endurance and strength shown through measurements of functional capacity and personal statement of participant.          Exercise Goals Re-Evaluation :     Exercise Goals Re-Evaluation    Row Name 02/06/17 1058 02/07/17 1010 02/21/17 1537 03/05/17 1022 03/19/17 1416     Exercise Goal Re-Evaluation   Exercise Goals Review Increase Physical Activity;Increase Strenth and Stamina Increase Physical Activity;Increase Strenth and Stamina Increase Physical Activity;Increase Strenth and Stamina Increase Physical Activity;Increase Strenth and Stamina Increase Physical Activity;Increase Strenth and Stamina   Comments Albeiro is off to a  good start with rehab.  He is doing well and has completed two full sessions.  We will continue to  monitor his progression. Reviewed home exercise with pt today.  Pt plans to walk and go to gym ([employer]) for exercise.  Reviewed THR, pulse, RPE, sign and symptoms, and when to call 911 or MD.  Also discussed weather considerations and indoor options.  Pt voiced understanding. Alvan has been out sick since 02/07/17.  He had been doing well and hopes to return and be able to pick up where he left off. We will continue to monitor him when he is able to return. Stiven has been doing well in rehab.  He is feeling better and much stronger.  He is now able to do more at work, almost back to normal!!  He has not been going to the gym as his work schedule has changed and his daughter is now home from school.  He has noticed how well he has progressed.   Braxley has continued to do well in rehab. He has missed the last two sessions.  He is now up to level 5 on the NuStep.  We will continue to monitor his progression.   Expected Outcomes Short: Start to increase workloads.  Long: Continue to work on IT sales professional. ShortL Start to add more exercise in on off days.  Long: Make exercise part of routine. Short: Able to come to class regularly again.  Long: Work on IT sales professional. Short:Try to figure out schedule to allow for home exercise.  Long: Continue to exercise long term. Short: Come to class regularly again.  Long: Continue to exercise at home regularly.   [place] Name 04/04/17 1623 04/16/17 1455 05/02/17 1027         Exercise Goal Re-Evaluation   Exercise Goals Review Increase Physical Activity;Increase Strenth and Stamina  -  -     Comments Nikos is back to class after missing sessions due to work. Out since last review, see ITP comments Out since last review, see ITP comments     Expected Outcomes  Short - Justino will attend regularly.   Long - Kamal will complete the program and graduate.  -   -        Discharge Exercise Prescription (Final Exercise Prescription Changes):     Exercise Prescription Changes - 04/04/17 1600      Response to Exercise   Blood Pressure (Admit) 132/64   Blood Pressure (Exercise) 158/74   Blood Pressure (Exit) 120/80   Heart Rate (Admit) 80 bpm   Heart Rate (Exercise) 134 bpm   Heart Rate (Exit) 79 bpm   Rating of Perceived Exertion (Exercise) 13   Symptoms none   Duration Progress to 45 minutes of aerobic exercise without signs/symptoms of physical distress   Intensity THRR unchanged     Progression   Progression Continue to progress workloads to maintain intensity without signs/symptoms of physical distress.   Average METs 2.7     Resistance Training   Training Prescription Yes   Weight 5 lbs   Reps 10-15     Interval Training   Interval Training No     Elliptical   Level 1   Speed 4.5   Minutes 15     T5 Nustep   Level 5   Minutes 15   METs 2.7      Nutrition:  Target Goals: Understanding of nutrition guidelines, daily intake of sodium 1500mg , cholesterol 200mg , calories 30% from fat and 7% or less from saturated fats, daily to have 5 or more servings of fruits and vegetables.  Biometrics:  Pre Biometrics - 01/14/17 1440      Pre Biometrics   Height 6' 0.8" (1.849 m)   Weight 183 lb 4.8 oz (83.1 kg)   Waist Circumference 30 inches   Hip Circumference 37.5 inches   Waist to Hip Ratio 0.8 %   BMI (Calculated) 24.4   Single Leg Stand 1.98 seconds       Nutrition Therapy Plan and Nutrition Goals:     Nutrition Therapy & Goals - 03/05/17 1100      Nutrition Therapy   RD appointment defered Yes      Nutrition Discharge: Rate Your Plate Scores:     Nutrition Assessments - 01/14/17 1311      MEDFICTS Scores   Pre Score 48      Nutrition Goals Re-Evaluation:     Nutrition Goals Re-Evaluation    Row Name 03/05/17 1100             Goals   Current Weight 191 lb (86.6 kg)       Comment  Edan declined a nutrtion appointment.  He continues to follow the diet set by his bariatrtic surgery.       Expected Outcome Continue to follow diet.          Nutrition Goals Discharge (Final Nutrition Goals Re-Evaluation):     Nutrition Goals Re-Evaluation - 03/05/17 1100      Goals   Current Weight 191 lb (86.6 kg)   Comment [place] declined a nutrtion appointment.  He continues to follow the diet set by his bariatrtic surgery.   Expected Outcome Continue to follow diet.      Psychosocial: Target Goals: Acknowledge presence or absence of significant depression and/or stress, maximize coping skills, provide positive support system. Participant is able to verbalize types and ability to use techniques and skills needed for reducing stress and depression.   Initial Review & Psychosocial Screening:     Initial Psych Review & Screening - 01/14/17 1303      Initial Review   Current issues with Current Stress Concerns   Source of Stress Concerns Family   Comments Quetin's Mother has been in the [facility] after being a "cancer survivor" and taking a test to make sure the cancer was gone but she had "kidney problems so the dye burned alot of her insides".      Family Dynamics   Good Support System? Yes     Barriers   Psychosocial barriers to participate in program The patient should benefit from training in stress management and relaxation.     Screening Interventions   Interventions Encouraged to exercise;To provide support and resources with identified psychosocial needs      Quality of Life Scores:      Quality of Life - 01/14/17 1309      Quality of Life Scores   Health/Function Pre 30 %   Socioeconomic Pre 30 %   Psych/Spiritual Pre 30 %   Family Pre 30 %   GLOBAL Pre 30 %      PHQ-9: Recent Review Flowsheet Data    Depression screen [facility] 2/9 05/06/2017 03/22/2017 02/15/2017 01/14/2017 12/17/2016   Decreased Interest 0 0 0 0 0   Down, Depressed, Hopeless 0 0  0 0 0   PHQ - 2 Score 0 0 0 0 0   Altered sleeping - - - 0 -   Tired, decreased energy - - - 0 -   Change in appetite - - - 0 -  Feeling bad or failure about yourself  - - - 0 -   Trouble concentrating - - - 0 -   Moving slowly or fidgety/restless - - - 0 -   Suicidal thoughts - - - 0 -   Difficult doing work/chores - - - Not difficult at all -     Interpretation of Total Score  Total Score Depression Severity:  1-4 = Minimal depression, 5-9 = Mild depression, 10-14 = Moderate depression, 15-19 = Moderately severe depression, 20-27 = Severe depression   Psychosocial Evaluation and Intervention:   Psychosocial Re-Evaluation:     Psychosocial Re-Evaluation    [place] Name 03/05/17 1106             Psychosocial Re-Evaluation   Current issues with Current Stress Concerns       Comments Payne has been out on days that councelor has been present.  He is doing well.  He has a strong support system between his wife and kids.  He is almost back to normal at work.  His mom has been transferred to [place] and should be going home soon.  That has been his biggest stress and he is excited about her recovery.  He is just letting things slide off his shoulders and not worring as much.   He has been sleeping good when he is able to sleep.  Currently just trying to figure out his schedule to allow for exercise and work.  He now has his daugthter home with him for the summer.       Expected Outcomes Short: Continue to work on staying [place] about his mom.  Long: Positive overall.          Psychosocial Discharge (Final Psychosocial Re-Evaluation):     Psychosocial Re-Evaluation - 03/05/17 1106      Psychosocial Re-Evaluation   Current issues with Current Stress Concerns   Comments [place] has been out on days that councelor has been present.  He is doing well.  He has a strong support system between his wife and kids.  He is almost back to normal at work.  His mom has been transferred to [place] and  should be going home soon.  That has been his biggest stress and he is excited about her recovery.  He is just letting things slide off his shoulders and not worring as much.   He has been sleeping good when he is able to sleep.  Currently just trying to figure out his schedule to allow for exercise and work.  He now has his daugthter home with him for the summer.   Expected Outcomes Short: Continue to work on staying [place] about his mom.  Long: Positive overall.      Vocational Rehabilitation: Provide vocational rehab assistance to qualifying candidates.   Vocational Rehab Evaluation & Intervention:     Vocational Rehab - 01/14/17 1302      Initial Vocational Rehab Evaluation & Intervention   Assessment shows need for Vocational Rehabilitation No      Education: Education Goals: Education classes will be provided on a weekly basis, covering required topics. Participant will state understanding/return demonstration of topics presented.  Learning Barriers/Preferences:     Learning Barriers/Preferences - 01/14/17 1301      Learning Barriers/Preferences   Learning Barriers None   Learning Preferences Skilled Demonstration      Education Topics: General Nutrition Guidelines/Fats and Fiber: -Group instruction provided by verbal, written material, models and posters to present the general guidelines for heart  healthy nutrition. Gives an explanation and review of dietary fats and fiber.   Controlling Sodium/Reading Food Labels: -Group verbal and written material supporting the discussion of sodium use in heart healthy nutrition. Review and explanation with models, verbal and written materials for utilization of the food label.   Cardiac Rehab from 04/04/2017 in [facility] Cardiac and Pulmonary Rehab  Date  01/29/17  Educator  PI  Instruction Review Code  2- meets goals/outcomes      Exercise Physiology & Risk Factors: - Group verbal and written instruction with models to review  the exercise physiology of the cardiovascular system and associated critical values. Details cardiovascular disease risk factors and the goals associated with each risk factor.   Aerobic Exercise & Resistance Training: - Gives group verbal and written discussion on the health impact of inactivity. On the components of aerobic and resistive training programs and the benefits of this training and how to safely progress through these programs.   Cardiac Rehab from 04/04/2017 in [facility] Cardiac and Pulmonary Rehab  Date  04/04/17  Educator  AS  Instruction Review Code  2- meets goals/outcomes      Flexibility, Balance, General Exercise Guidelines: - Provides group verbal and written instruction on the benefits of flexibility and balance training programs. Provides general exercise guidelines with specific guidelines to those with heart or lung disease. Demonstration and skill practice provided.   Stress Management: - Provides group verbal and written instruction about the health risks of elevated stress, cause of high stress, and healthy ways to reduce stress.   Depression: - Provides group verbal and written instruction on the correlation between heart/lung disease and depressed mood, treatment options, and the stigmas associated with seeking treatment.   Cardiac Rehab from 04/04/2017 in [facility] Cardiac and Pulmonary Rehab  Date  01/31/17  Educator  [facility]  Instruction Review Code  2- meets goals/outcomes      Anatomy & Physiology of the Heart: - Group verbal and written instruction and models provide basic cardiac anatomy and physiology, with the coronary electrical and arterial systems. Review of: AMI, Angina, Valve disease, Heart Failure, Cardiac Arrhythmia, Pacemakers, and the ICD.   Cardiac Rehab from 04/04/2017 in [facility] Cardiac and Pulmonary Rehab  Date  01/24/17  Educator  [place]  Instruction Review Code  2- meets goals/outcomes      Cardiac Procedures: - Group verbal and written instruction  and models to describe the testing methods done to diagnose heart disease. Reviews the outcomes of the test results. Describes the treatment choices: Medical Management, Angioplasty, or Coronary Bypass Surgery.   Cardiac Rehab from 04/04/2017 in [facility] Cardiac and Pulmonary Rehab  Date  02/26/17  Educator  SB  Instruction Review Code  2- meets goals/outcomes      Cardiac Medications: - Group verbal and written instruction to review commonly prescribed medications for heart disease. Reviews the medication, class of the drug, and side effects. Includes the steps to properly store meds and maintain the prescription regimen.   Cardiac Rehab from 04/04/2017 in [facility] Cardiac and Pulmonary Rehab  Date  03/05/17 [6/12 part 1 6/14 Part Two]  Educator  sb  Instruction Review Code  2- meets goals/outcomes      Go Sex-Intimacy & Heart Disease, Get SMART - Goal Setting: - Group verbal and written instruction through game format to discuss heart disease and the return to sexual intimacy. Provides group verbal and written material to discuss and apply goal setting through the application of the S.M.A.R.T. Method.   Cardiac  Rehab from 04/04/2017 in [facility] Cardiac and Pulmonary Rehab  Date  02/26/17  Educator  SB  Instruction Review Code  2- meets goals/outcomes      Other Matters of the Heart: - Provides group verbal, written materials and models to describe Heart Failure, Angina, Valve Disease, and Diabetes in the realm of heart disease. Includes description of the disease process and treatment options available to the cardiac patient.   Cardiac Rehab from 04/04/2017 in [facility] Cardiac and Pulmonary Rehab  Date  01/24/17  Educator  KS  Instruction Review Code  2- meets goals/outcomes      Exercise & Equipment Safety: - Individual verbal instruction and demonstration of equipment use and safety with use of the equipment.   Cardiac Rehab from 04/04/2017 in [facility] Cardiac and Pulmonary Rehab  Date  01/14/17   Educator  C. Enterkin,RN  Instruction Review Code  1- partially meets, needs review/practice      Infection Prevention: - Provides verbal and written material to individual with discussion of infection control including proper hand washing and proper equipment cleaning during exercise session.   Cardiac Rehab from 04/04/2017 in [facility] Cardiac and Pulmonary Rehab  Date  01/14/17  Educator  C. Enterkin, RN  Instruction Review Code  2- meets goals/outcomes      Falls Prevention: - Provides verbal and written material to individual with discussion of falls prevention and safety.   Cardiac Rehab from 04/04/2017 in [facility] Cardiac and Pulmonary Rehab  Date  01/14/17  Educator  C. ENterkinRN  Instruction Review Code  1- partially meets, needs review/practice      Diabetes: - Individual verbal and written instruction to review signs/symptoms of diabetes, desired ranges of glucose level fasting, after meals and with exercise. Advice that pre and post exercise glucose checks will be done for 3 sessions at entry of program.   Cardiac Rehab from 04/04/2017 in [facility] Cardiac and Pulmonary Rehab  Date  01/14/17  Educator  C. [place]  Instruction Review Code  1- partially meets, needs review/practice       Knowledge Questionnaire Score:     Knowledge Questionnaire Score - 01/14/17 1310      Knowledge Questionnaire Score   Pre Score 10/28      Core Components/Risk Factors/Patient Goals at Admission:     Personal Goals and Risk Factors at Admission - 01/14/17 1311      Core Components/Risk Factors/Patient Goals on Admission    Weight Management Yes;Weight Maintenance   Intervention Weight Management: Develop a combined nutrition and exercise program designed to reach desired caloric intake, while maintaining appropriate intake of nutrient and fiber, sodium and fats, and appropriate energy expenditure required for the weight goal.;Weight Management: Provide education and appropriate  resources to help participant work on and attain dietary goals.   Admit Weight 183 lb 4.8 oz (83.1 kg)   Expected Outcomes Short Term: Continue to assess and modify interventions until short term weight is achieved;Long Term: Adherence to nutrition and physical activity/exercise program aimed toward attainment of established weight goal;Weight Maintenance: Understanding of the daily nutrition guidelines, which includes 25-35% calories from fat, 7% or less cal from saturated fats, less than 200mg  cholesterol, less than 1.5gm of sodium, & 5 or more servings of fruits and vegetables daily   Diabetes Yes   Intervention Provide education about signs/symptoms and action to take for hypo/hyperglycemia.;Provide education about proper nutrition, including hydration, and aerobic/resistive exercise prescription along with prescribed medications to achieve blood glucose in normal ranges: Fasting glucose 65-99  mg/dL   Expected Outcomes Short Term: Participant verbalizes understanding of the signs/symptoms and immediate care of hyper/hypoglycemia, proper foot care and importance of medication, aerobic/resistive exercise and nutrition plan for blood glucose control.;Long Term: Attainment of HbA1C < 7%.   Heart Failure Yes   Intervention Provide a combined exercise and nutrition program that is supplemented with education, support and counseling about heart failure. Directed toward relieving symptoms such as shortness of breath, decreased exercise tolerance, and extremity edema.   Expected Outcomes Improve functional capacity of life;Short term: Attendance in program 2-3 days a week with increased exercise capacity. Reported lower sodium intake. Reported increased fruit and vegetable intake. Reports medication compliance.;Short term: Daily weights obtained and reported for increase. Utilizing diuretic protocols set by physician.;Long term: Adoption of self-care skills and reduction of barriers for early signs and symptoms  recognition and intervention leading to self-care maintenance.   Stress Yes  His Mother was in the hospital same time he was   Intervention Offer individual and/or small group education and counseling on adjustment to heart disease, stress management and health-related lifestyle change. Teach and support self-help strategies.;Refer participants experiencing significant psychosocial distress to appropriate mental health specialists for further evaluation and treatment. When possible, include family members and significant others in education/counseling sessions.   Expected Outcomes Short Term: Participant demonstrates changes in health-related behavior, relaxation and other stress management skills, ability to obtain effective social support, and compliance with psychotropic medications if prescribed.;Long Term: Emotional wellbeing is indicated by absence of clinically significant psychosocial distress or social isolation.      Core Components/Risk Factors/Patient Goals Review:      Goals and Risk Factor Review    Row Name 03/05/17 1037 04/04/17 1044           Core Components/Risk Factors/Patient Goals Review   Personal Goals Review Weight Management/Obesity;Heart Failure;Hypertension;Lipids;Diabetes Stress;Diabetes;Hypertension      Review Payam has been doing well in rehab.  His weight has been pretty steady.  He does weigh daily and has not had any symptoms of heart failure.  His blood sugars have been around 112 mg/dl and checks daily.  He does not check his blood pressure at home but it has been good while he is here.  Delonte has not had any problems with his medications and feel they are working for him.   Tsutomu states his numbers for FBG and BP have been stable.  His stress is reduced as he is not working as much overtime and he feel she can attend [facility] regularly.        Expected Outcomes Short: Continue to stay on top of weight and heart failure.  Long: Continue to work on risk  factor modifications. Short - Abdi will be able attend class regularly and graduate.  Long - Tajon will maintain exercise and stress management after graduation.         Core Components/Risk Factors/Patient Goals at Discharge (Final Review):      Goals and Risk Factor Review - 04/04/17 1044      Core Components/Risk Factors/Patient Goals Review   Personal Goals Review Stress;Diabetes;Hypertension   Review Keyonta states his numbers for FBG and BP have been stable.  His stress is reduced as he is not working as much overtime and he feel she can attend [facility] regularly.     Expected Outcomes Short - Gamble will be able attend class regularly and graduate.  Long - Aashir will maintain exercise and stress management after graduation.  ITP Comments:     ITP Comments    Row Name 01/14/17 1123 01/16/17 0619 02/13/17 0901 02/19/17 1443 03/13/17 0539   ITP Comments ITP Created during Medical review Documentation DX of 12/19/2016 [place] Care Note 30 day review. Continue with ITP unless directed changes per Medical Director review   New to program has attended medical review 30 day review. Continue with ITP unless directed changes per Medical Director review  new to program  Called to check on status of return.  Bryne called out last week with cold and yesterday for same reason.  Left message on machine. 30 day review. Continue with ITP unless directed changes per Medical Director review   [place] Name 03/19/17 1415 04/08/17 1642 04/10/17 0645 04/16/17 1453 05/02/17 1027   ITP Comments Attempted to check on status. Unable to recieve calls at this time. Dev called to let us know that he was just leaving the emergency room after pulling back muscle and getting a cortisone injection.  He will be out tomorrow and will miss Thursday as well due to a doctor's appointment.  He hopes to return next week.  30 day review. Continue with ITP unless directed changes per Medical Director review     Tammy was in the ED on 7/22 for back and shoulder pain again. He has a follow appointment on 7/30.  XRay showed over growth of bone on shoulder blade.  We will await his return after the appointment next week.  Called to check on status of return.  Left message on voicemail   Row Name 05/08/17 0619           ITP Comments 30 day review. Continue with ITP unless directed changes per Medical Director review  Last visit 04/04/2017   last visit prior was 6/19          Comments:

## 2017-05-08 NOTE — Telephone Encounter (Signed)
Called to check on status of return to program.  He had told his doctor that he was not able to return to class with his schedule and his left shoulder injury.  Left him a message to call to confirm before we officially discharge him.

## 2017-05-09 NOTE — Progress Notes (Signed)
Discharge Summary  Patient Details  Name: Philip Ponce MRN: [id number] Date of Birth: 02-10-1971 Referring Provider:     Cardiac Rehab from 01/14/2017 in [facility] Cardiac and Pulmonary Rehab  Referring Provider  End, Harrell Gave MD       Number of Visits: 19/36  Reason for Discharge:  Early Exit:  Back to work and Personal  Smoking History:  History  Smoking Status  . Never Smoker  Smokeless Tobacco  . Never Used    Diagnosis:  ST elevation myocardial infarction (STEMI), unspecified artery (HCC)  Heart failure, chronic systolic ([place])  ADL [facility]SD:   Initial Exercise Prescription:     Initial Exercise Prescription - 01/14/17 1400      Date of Initial Exercise RX and Referring Provider   Date 01/14/17   Referring Provider End, Harrell Gave MD     Treadmill   MPH 3   Grade 0.5   Minutes 15   METs 3.5     Elliptical   Level 1   Speed 4.5   Minutes 15     T5 Nustep   Level 3   SPM 80   Minutes 15   METs 2     Prescription Details   Frequency (times per week) 2   Duration Progress to 45 minutes of aerobic exercise without signs/symptoms of physical distress     Intensity   THRR 40-80% of Max Heartrate 114-153   Ratings of Perceived Exertion 11-13   Perceived Dyspnea 0-4     Progression   Progression Continue to progress workloads to maintain intensity without signs/symptoms of physical distress.     Resistance Training   Training Prescription Yes   Weight 4 lbs   Reps 10-15      Discharge Exercise Prescription (Final Exercise Prescription Changes):     Exercise Prescription Changes - 04/04/17 1600      Response to Exercise   Blood Pressure (Admit) 132/64   Blood Pressure (Exercise) 158/74   Blood Pressure (Exit) 120/80   Heart Rate (Admit) 80 bpm   Heart Rate (Exercise) 134 bpm   Heart Rate (Exit) 79 bpm   Rating of Perceived Exertion (Exercise) 13   Symptoms none   Duration Progress to 45 minutes of aerobic exercise without  signs/symptoms of physical distress   Intensity THRR unchanged     Progression   Progression Continue to progress workloads to maintain intensity without signs/symptoms of physical distress.   Average METs 2.7     Resistance Training   Training Prescription Yes   Weight 5 lbs   Reps 10-15     Interval Training   Interval Training No     Elliptical   Level 1   Speed 4.5   Minutes 15     T5 Nustep   Level 5   Minutes 15   METs 2.7      Functional Capacity:     6 Minute Walk    Row Name 01/14/17 1437         6 Minute Walk   Phase Initial     Distance 1570 feet     Walk Time 6 minutes     # of Rest Breaks 0     MPH 2.97     METS 5.17     RPE 9     VO2 Peak 18.11     Symptoms No     Resting HR 75 bpm     Resting BP 138/70     Max Ex.  HR 112 bpm     Max Ex. BP 146/74     2 Minute Post BP 134/70        Psychological, QOL, Others - Outcomes: PHQ 2/9: Depression screen [facility] 2/9 05/06/2017 03/22/2017 02/15/2017 01/14/2017 12/17/2016  Decreased Interest 0 0 0 0 0  Down, Depressed, Hopeless 0 0 0 0 0  PHQ - 2 Score 0 0 0 0 0  Altered sleeping - - - 0 -  Tired, decreased energy - - - 0 -  Change in appetite - - - 0 -  Feeling bad or failure about yourself  - - - 0 -  Trouble concentrating - - - 0 -  Moving slowly or fidgety/restless - - - 0 -  Suicidal thoughts - - - 0 -  Difficult doing work/chores - - - Not difficult at all -    Quality of Life:     Quality of Life - 01/14/17 1309      Quality of Life Scores   Health/Function Pre 30 %   Socioeconomic Pre 30 %   Psych/Spiritual Pre 30 %   Family Pre 30 %   GLOBAL Pre 30 %      Personal Goals: Goals established at orientation with interventions provided to work toward goal.     Personal Goals and Risk Factors at Admission - 01/14/17 1311      Core Components/Risk Factors/Patient Goals on Admission    Weight Management Yes;Weight Maintenance   Intervention Weight Management: Develop a combined  nutrition and exercise program designed to reach desired caloric intake, while maintaining appropriate intake of nutrient and fiber, sodium and fats, and appropriate energy expenditure required for the weight goal.;Weight Management: Provide education and appropriate resources to help participant work on and attain dietary goals.   Admit Weight 183 lb 4.8 oz (83.1 kg)   Expected Outcomes Short Term: Continue to assess and modify interventions until short term weight is achieved;Long Term: Adherence to nutrition and physical activity/exercise program aimed toward attainment of established weight goal;Weight Maintenance: Understanding of the daily nutrition guidelines, which includes 25-35% calories from fat, 7% or less cal from saturated fats, less than 200mg  cholesterol, less than 1.5gm of sodium, & 5 or more servings of fruits and vegetables daily   Diabetes Yes   Intervention Provide education about signs/symptoms and action to take for hypo/hyperglycemia.;Provide education about proper nutrition, including hydration, and aerobic/resistive exercise prescription along with prescribed medications to achieve blood glucose in normal ranges: Fasting glucose 65-99 mg/dL   Expected Outcomes Short Term: Participant verbalizes understanding of the signs/symptoms and immediate care of hyper/hypoglycemia, proper foot care and importance of medication, aerobic/resistive exercise and nutrition plan for blood glucose control.;Long Term: Attainment of HbA1C < 7%.   Heart Failure Yes   Intervention Provide a combined exercise and nutrition program that is supplemented with education, support and counseling about heart failure. Directed toward relieving symptoms such as shortness of breath, decreased exercise tolerance, and extremity edema.   Expected Outcomes Improve functional capacity of life;Short term: Attendance in program 2-3 days a week with increased exercise capacity. Reported lower sodium intake. Reported  increased fruit and vegetable intake. Reports medication compliance.;Short term: Daily weights obtained and reported for increase. Utilizing diuretic protocols set by physician.;Long term: Adoption of self-care skills and reduction of barriers for early signs and symptoms recognition and intervention leading to self-care maintenance.   Stress Yes  His Mother was in the hospital same time he was   Intervention Offer individual  and/or small group education and counseling on adjustment to heart disease, stress management and health-related lifestyle change. Teach and support self-help strategies.;Refer participants experiencing significant psychosocial distress to appropriate mental health specialists for further evaluation and treatment. When possible, include family members and significant others in education/counseling sessions.   Expected Outcomes Short Term: Participant demonstrates changes in health-related behavior, relaxation and other stress management skills, ability to obtain effective social support, and compliance with psychotropic medications if prescribed.;Long Term: Emotional wellbeing is indicated by absence of clinically significant psychosocial distress or social isolation.       Personal Goals Discharge:     Goals and Risk Factor Review    Row Name 03/05/17 1037 04/04/17 1044           Core Components/Risk Factors/Patient Goals Review   Personal Goals Review Weight Management/Obesity;Heart Failure;Hypertension;Lipids;Diabetes Stress;Diabetes;Hypertension      Review Azavier has been doing well in rehab.  His weight has been pretty steady.  He does weigh daily and has not had any symptoms of heart failure.  His blood sugars have been around 112 mg/dl and checks daily.  He does not check his blood pressure at home but it has been good while he is here.  Dover has not had any problems with his medications and feel they are working for him.   Ranjit states his numbers for FBG and  BP have been stable.  His stress is reduced as he is not working as much overtime and he feel she can attend [facility] regularly.        Expected Outcomes Short: Continue to stay on top of weight and heart failure.  Long: Continue to work on risk factor modifications. Short - Elad will be able attend class regularly and graduate.  Long - Ava will maintain exercise and stress management after graduation.         Nutrition & Weight - Outcomes:     Pre Biometrics - 01/14/17 1440      Pre Biometrics   Height 6' 0.8" (1.849 m)   Weight 183 lb 4.8 oz (83.1 kg)   Waist Circumference 30 inches   Hip Circumference 37.5 inches   Waist to Hip Ratio 0.8 %   BMI (Calculated) 24.4   Single Leg Stand 1.98 seconds       Nutrition:     Nutrition Therapy & Goals - 03/05/17 1100      Nutrition Therapy   RD appointment defered Yes      Nutrition Discharge:     Nutrition Assessments - 01/14/17 1311      MEDFICTS Scores   Pre Score 48      Education Questionnaire Score:     Knowledge Questionnaire Score - 01/14/17 1310      Knowledge Questionnaire Score   Pre Score 10/28      Goals reviewed with patient; copy given to patient.

## 2017-05-09 NOTE — Progress Notes (Signed)
Cardiac Individual Treatment Plan  Patient Details  Name: NORTON BIVINS MRN: [id number] Date of Birth: 10-Jan-1971 Referring Provider:     Cardiac Rehab from 01/14/2017 in [facility] Cardiac and Pulmonary Rehab  Referring Provider  End, Harrell Gave MD      Initial Encounter Date:    Cardiac Rehab from 01/14/2017 in [facility] Cardiac and Pulmonary Rehab  Date  01/14/17  Referring Provider  End, Harrell Gave MD      Visit Diagnosis: ST elevation myocardial infarction (STEMI), unspecified artery ([place])  Heart failure, chronic systolic ([place])  Patient's Home Medications on Admission:  Current Outpatient Prescriptions:  .  calcium carbonate (OS-CAL) 600 MG TABS tablet, Take 600 mg by mouth 2 (two) times daily with a meal., Disp: , Rfl:  .  carvedilol (COREG) 6.25 MG tablet, Take 1 tablet (6.25 mg total) by mouth 2 (two) times daily., Disp: 180 tablet, Rfl: 3 .  furosemide (LASIX) 20 MG tablet, Take 20 mg by mouth as needed., Disp: , Rfl:  .  Garlic 101 MG TABS, Take 100 mg by mouth daily., Disp: , Rfl:  .  glucose blood (ONE TOUCH ULTRA TEST) test strip, Check blood sugar once daily and as instructed. Dx E11.9, Disp: 100 each, Rfl: 1 .  Insulin Glargine (TOUJEO SOLOSTAR) 300 UNIT/ML SOPN, Inject 15 Units into the skin daily., Disp: , Rfl:  .  metFORMIN (GLUCOPHAGE) 1000 MG tablet, Take 1,000 mg by mouth 2 (two) times daily with a meal., Disp: , Rfl:  .  Multiple Vitamins-Minerals (MENS MULTI VITAMIN & MINERAL PO), Take 2 tablets by mouth daily. Men's Vita Fusion, Disp: , Rfl:  .  Naproxen Sodium (ALEVE) 220 MG CAPS, Take 2 capsules by mouth as needed., Disp: , Rfl:  .  Omega-3 Fatty Acids (FISH OIL) 1200 MG CAPS, Take 1 capsule by mouth daily., Disp: , Rfl:  .  omeprazole (PRILOSEC) 40 MG capsule, Take 40 mg by mouth daily., Disp: , Rfl:  .  sacubitril-valsartan (ENTRESTO) 49-51 MG, Take 1 tablet by mouth 2 (two) times daily., Disp: 60 tablet, Rfl: 5 .  traMADol (ULTRAM) 50 MG tablet, Take by  mouth every 6 (six) hours as needed., Disp: , Rfl:   Past Medical History: Past Medical History:  Diagnosis Date  . Acute blood loss anemia 11/20/2016  . CHF (congestive heart failure) ([place])   . Coronary artery disease   . Diabetes mellitus type I ([place])   . Slipped intervertebral disc    L4 L5  . STEMI (ST elevation myocardial infarction) ([place])     Tobacco Use: History  Smoking Status  . Never Smoker  Smokeless Tobacco  . Never Used    Labs: Recent Review Flowsheet Data    Labs for ITP Cardiac and Pulmonary Rehab Latest Ref Rng & Units 01/01/2013 04/15/2015 10/07/2015 01/03/2016 11/27/2016   Cholestrol 0 - 200 mg/dL - 101 121 - -   LDLCALC 0 - 99 mg/dL - 38 39 - -   HDL >39.00 mg/dL - 51.40 66.70 - -   Trlycerides 0.0 - 149.0 mg/dL - 58.0 78.0 - -   Hemoglobin A1c 4.6 - 6.5 % 8.6(H) 7.0(H) 10.5(H) 10.6(H) 9.6(H)       Exercise Target Goals:    Exercise Program Goal: Individual exercise prescription set with THRR, safety & activity barriers. Participant demonstrates ability to understand and report RPE using BORG scale, to self-measure pulse accurately, and to acknowledge the importance of the exercise prescription.  Exercise Prescription Goal: Starting with aerobic activity 30 plus  minutes a day, 3 days per week for initial exercise prescription. Provide home exercise prescription and guidelines that participant acknowledges understanding prior to discharge.  Activity Barriers & Risk Stratification:     Activity Barriers & Cardiac Risk Stratification - 01/14/17 1311      Activity Barriers & Cardiac Risk Stratification   Activity Barriers Balance Concerns;Decreased Ventricular Function;Deconditioning;Muscular Weakness  he said that sometimes his equilibrium gets off   Cardiac Risk Stratification High      6 Minute Walk:     6 Minute Walk    Row Name 01/14/17 1437         6 Minute Walk   Phase Initial     Distance 1570 feet     Walk Time 6 minutes     # of  Rest Breaks 0     MPH 2.97     METS 5.17     RPE 9     VO2 Peak 18.11     Symptoms No     Resting HR 75 bpm     Resting BP 138/70     Max Ex. HR 112 bpm     Max Ex. BP 146/74     2 Minute Post BP 134/70        Oxygen Initial Assessment:   Oxygen Re-Evaluation:   Oxygen Discharge (Final Oxygen Re-Evaluation):   Initial Exercise Prescription:     Initial Exercise Prescription - 01/14/17 1400      Date of Initial Exercise RX and Referring Provider   Date 01/14/17   Referring Provider End, Harrell Gave MD     Treadmill   MPH 3   Grade 0.5   Minutes 15   METs 3.5     Elliptical   Level 1   Speed 4.5   Minutes 15     T5 Nustep   Level 3   SPM 80   Minutes 15   METs 2     Prescription Details   Frequency (times per week) 2   Duration Progress to 45 minutes of aerobic exercise without signs/symptoms of physical distress     Intensity   THRR 40-80% of Max Heartrate 114-153   Ratings of Perceived Exertion 11-13   Perceived Dyspnea 0-4     Progression   Progression Continue to progress workloads to maintain intensity without signs/symptoms of physical distress.     Resistance Training   Training Prescription Yes   Weight 4 lbs   Reps 10-15      Perform Capillary Blood Glucose checks as needed.  Exercise Prescription Changes:     Exercise Prescription Changes    Row Name 01/14/17 1400 02/06/17 1100 02/07/17 1000 02/21/17 1500 03/07/17 1100     Response to Exercise   Blood Pressure (Admit) 138/70 142/64  - 126/64 110/60   Blood Pressure (Exercise) 146/74 144/76  - 138/74 126/62   Blood Pressure (Exit) 134/70 124/64  - 118/62 124/64   Heart Rate (Admit) 75 bpm 99 bpm  - 99 bpm 99 bpm   Heart Rate (Exercise) 112 bpm 151 bpm  - 120 bpm 135 bpm   Heart Rate (Exit) 89 bpm 88 bpm  - 88 bpm 77 bpm   Oxygen Saturation (Admit) 100 %  -  -  -  -   Oxygen Saturation (Exercise) 100 %  -  -  -  -   Rating of Perceived Exertion (Exercise) 9 13  - 11 14    Symptoms none none none none none  Comments walk test results  -  -  -  -   Duration  - Progress to 45 minutes of aerobic exercise without signs/symptoms of physical distress Progress to 45 minutes of aerobic exercise without signs/symptoms of physical distress Progress to 45 minutes of aerobic exercise without signs/symptoms of physical distress Progress to 45 minutes of aerobic exercise without signs/symptoms of physical distress   Intensity  - THRR unchanged THRR unchanged THRR unchanged THRR unchanged     Progression   Progression  - Continue to progress workloads to maintain intensity without signs/symptoms of physical distress. Continue to progress workloads to maintain intensity without signs/symptoms of physical distress. Continue to progress workloads to maintain intensity without signs/symptoms of physical distress. Continue to progress workloads to maintain intensity without signs/symptoms of physical distress.   Average METs  - 3.4 3.4 3.48 3.6     Resistance Training   Training Prescription  - Yes Yes Yes Yes   Weight  - 4 lbs 4 lbs 4 lbs 5 lbs   Reps  - 10-15 10-15 10-15 10-15     Interval Training   Interval Training  -  -  - No No     Treadmill   MPH  - 3 3 3.3 3.3   Grade  - 0.5 0.5 0.5 3   Minutes  - 15 15 15 15    METs  - 3.5 3.5 3.75 4.89     Elliptical   Level  - 1 1 1 1    Speed  - 4.5 4.5 4.5 4.5   Minutes  - 15 15 15 15      T5 Nustep   Level  - 3 3 3 5    Minutes  - 15 15 15 15    METs  - 3.3 3.3 3.2 2.3     Home Exercise Plan   Plans to continue exercise at  -  - [employer] (comment)  walking and Body by [employer] (comment)  walking and [employer] by [employer] (comment)  walking and Body by [employer]  -  - Add 3 additional days to program exercise sessions. Add 3 additional days to program exercise sessions. Add 3 additional days to program exercise sessions.   Initial Home Exercises Provided  -  - 02/07/17 02/07/17  02/07/17   Row Name 03/19/17 1400 04/04/17 1600           Response to Exercise   Blood Pressure (Admit) 140/78 132/64      Blood Pressure (Exercise) 160/80 158/74      Blood Pressure (Exit) 130/70 120/80      Heart Rate (Admit) 81 bpm 80 bpm      Heart Rate (Exercise) 126 bpm 134 bpm      Heart Rate (Exit) 90 bpm 79 bpm      Rating of Perceived Exertion (Exercise) 13 13      Symptoms none none      Duration Progress to 45 minutes of aerobic exercise without signs/symptoms of physical distress Progress to 45 minutes of aerobic exercise without signs/symptoms of physical distress      Intensity THRR unchanged THRR unchanged        Progression   Progression Continue to progress workloads to maintain intensity without signs/symptoms of physical distress. Continue to progress workloads to maintain intensity without signs/symptoms of physical distress.      Average METs 3.76 2.7        Resistance Training   Training Prescription Yes  Yes      Weight 5 lbs 5 lbs      Reps 10-15 10-15        Interval Training   Interval Training No No        Treadmill   MPH 3.3  -      Grade 3  -      Minutes 15  -      METs 4.89  -        Elliptical   Level  - 1      Speed  - 4.5      Minutes  - 15        T5 Nustep   Level 5 5      Minutes 15 15      METs 2.7 2.7        Home Exercise Plan   Plans to continue exercise at [facility] (comment)  walking and [employer]  -      Frequency Add 3 additional days to program exercise sessions.  -      Initial Home Exercises Provided 02/07/17  -         Exercise Comments:     Exercise Comments    Row Name 01/24/17 1049           Exercise Comments First full day of exercise!  Patient was oriented to gym and equipment including functions, settings, policies, and procedures.  Patient's individual exercise prescription and treatment plan were reviewed.  All starting workloads were established based on the results of the 6 minute walk  test done at initial orientation visit.  The plan for exercise progression was also introduced and progression will be customized based on patient's performance and goals.          Exercise Goals and Review:     Exercise Goals    Row Name 01/14/17 1439             Exercise Goals   Increase Physical Activity Yes       Intervention Develop an individualized exercise prescription for aerobic and resistive training based on initial evaluation findings, risk stratification, comorbidities and participant's personal goals.;Provide advice, education, support and counseling about physical activity/exercise needs.       Expected Outcomes Achievement of increased cardiorespiratory fitness and enhanced flexibility, muscular endurance and strength shown through measurements of functional capacity and personal statement of participant.       Increase Strength and Stamina Yes       Intervention Provide advice, education, support and counseling about physical activity/exercise needs.;Develop an individualized exercise prescription for aerobic and resistive training based on initial evaluation findings, risk stratification, comorbidities and participant's personal goals.       Expected Outcomes Achievement of increased cardiorespiratory fitness and enhanced flexibility, muscular endurance and strength shown through measurements of functional capacity and personal statement of participant.          Exercise Goals Re-Evaluation :     Exercise Goals Re-Evaluation    Row Name 02/06/17 1058 02/07/17 1010 02/21/17 1537 03/05/17 1022 03/19/17 1416     Exercise Goal Re-Evaluation   Exercise Goals Review Increase Physical Activity;Increase Strenth and Stamina Increase Physical Activity;Increase Strenth and Stamina Increase Physical Activity;Increase Strenth and Stamina Increase Physical Activity;Increase Strenth and Stamina Increase Physical Activity;Increase Strenth and Stamina   Comments Demaris is off to a  good start with rehab.  He is doing well and has completed two full sessions.  We will continue to  monitor his progression. Reviewed home exercise with pt today.  Pt plans to walk and go to gym ([employer]) for exercise.  Reviewed THR, pulse, RPE, sign and symptoms, and when to call 911 or MD.  Also discussed weather considerations and indoor options.  Pt voiced understanding. Kaz has been out sick since 02/07/17.  He had been doing well and hopes to return and be able to pick up where he left off. We will continue to monitor him when he is able to return. Lancelot has been doing well in rehab.  He is feeling better and much stronger.  He is now able to do more at work, almost back to normal!!  He has not been going to the gym as his work schedule has changed and his daughter is now home from school.  He has noticed how well he has progressed.   Owens has continued to do well in rehab. He has missed the last two sessions.  He is now up to level 5 on the NuStep.  We will continue to monitor his progression.   Expected Outcomes Short: Start to increase workloads.  Long: Continue to work on IT sales professional. ShortL Start to add more exercise in on off days.  Long: Make exercise part of routine. Short: Able to come to class regularly again.  Long: Work on IT sales professional. Short:Try to figure out schedule to allow for home exercise.  Long: Continue to exercise long term. Short: Come to class regularly again.  Long: Continue to exercise at home regularly.   Sisters Name 04/04/17 1623 04/16/17 1455 05/02/17 1027         Exercise Goal Re-Evaluation   Exercise Goals Review Increase Physical Activity;Increase Strenth and Stamina  -  -     Comments Quantae is back to class after missing sessions due to work. Out since last review, see ITP comments Out since last review, see ITP comments     Expected Outcomes  Short - Tymier will attend regularly.   Long - Kamdyn will complete the program and graduate.  -   -        Discharge Exercise Prescription (Final Exercise Prescription Changes):     Exercise Prescription Changes - 04/04/17 1600      Response to Exercise   Blood Pressure (Admit) 132/64   Blood Pressure (Exercise) 158/74   Blood Pressure (Exit) 120/80   Heart Rate (Admit) 80 bpm   Heart Rate (Exercise) 134 bpm   Heart Rate (Exit) 79 bpm   Rating of Perceived Exertion (Exercise) 13   Symptoms none   Duration Progress to 45 minutes of aerobic exercise without signs/symptoms of physical distress   Intensity THRR unchanged     Progression   Progression Continue to progress workloads to maintain intensity without signs/symptoms of physical distress.   Average METs 2.7     Resistance Training   Training Prescription Yes   Weight 5 lbs   Reps 10-15     Interval Training   Interval Training No     Elliptical   Level 1   Speed 4.5   Minutes 15     T5 Nustep   Level 5   Minutes 15   METs 2.7      Nutrition:  Target Goals: Understanding of nutrition guidelines, daily intake of sodium 1500mg , cholesterol 200mg , calories 30% from fat and 7% or less from saturated fats, daily to have 5 or more servings of fruits and vegetables.  Biometrics:  Pre Biometrics - 01/14/17 1440      Pre Biometrics   Height 6' 0.8" (1.849 m)   Weight 183 lb 4.8 oz (83.1 kg)   Waist Circumference 30 inches   Hip Circumference 37.5 inches   Waist to Hip Ratio 0.8 %   BMI (Calculated) 24.4   Single Leg Stand 1.98 seconds       Nutrition Therapy Plan and Nutrition Goals:     Nutrition Therapy & Goals - 03/05/17 1100      Nutrition Therapy   RD appointment defered Yes      Nutrition Discharge: Rate Your Plate Scores:     Nutrition Assessments - 01/14/17 1311      MEDFICTS Scores   Pre Score 48      Nutrition Goals Re-Evaluation:     Nutrition Goals Re-Evaluation    Row Name 03/05/17 1100             Goals   Current Weight 191 lb (86.6 kg)       Comment  Quention declined a nutrtion appointment.  He continues to follow the diet set by his bariatrtic surgery.       Expected Outcome Continue to follow diet.          Nutrition Goals Discharge (Final Nutrition Goals Re-Evaluation):     Nutrition Goals Re-Evaluation - 03/05/17 1100      Goals   Current Weight 191 lb (86.6 kg)   Comment Xavian declined a nutrtion appointment.  He continues to follow the diet set by his bariatrtic surgery.   Expected Outcome Continue to follow diet.      Psychosocial: Target Goals: Acknowledge presence or absence of significant depression and/or stress, maximize coping skills, provide positive support system. Participant is able to verbalize types and ability to use techniques and skills needed for reducing stress and depression.   Initial Review & Psychosocial Screening:     Initial Psych Review & Screening - 01/14/17 1303      Initial Review   Current issues with Current Stress Concerns   Source of Stress Concerns Family   Comments Quetin's Mother has been in the [facility] after being a "cancer survivor" and taking a test to make sure the cancer was gone but she had "kidney problems so the dye burned alot of her insides".      Family Dynamics   Good Support System? Yes     Barriers   Psychosocial barriers to participate in program The patient should benefit from training in stress management and relaxation.     Screening Interventions   Interventions Encouraged to exercise;To provide support and resources with identified psychosocial needs      Quality of Life Scores:      Quality of Life - 01/14/17 1309      Quality of Life Scores   Health/Function Pre 30 %   Socioeconomic Pre 30 %   Psych/Spiritual Pre 30 %   Family Pre 30 %   GLOBAL Pre 30 %      PHQ-9: Recent Review Flowsheet Data    Depression screen [facility] 2/9 05/06/2017 03/22/2017 02/15/2017 01/14/2017 12/17/2016   Decreased Interest 0 0 0 0 0   Down, Depressed, Hopeless 0 0  0 0 0   PHQ - 2 Score 0 0 0 0 0   Altered sleeping - - - 0 -   Tired, decreased energy - - - 0 -   Change in appetite - - - 0 -  Feeling bad or failure about yourself  - - - 0 -   Trouble concentrating - - - 0 -   Moving slowly or fidgety/restless - - - 0 -   Suicidal thoughts - - - 0 -   Difficult doing work/chores - - - Not difficult at all -     Interpretation of Total Score  Total Score Depression Severity:  1-4 = Minimal depression, 5-9 = Mild depression, 10-14 = Moderate depression, 15-19 = Moderately severe depression, 20-27 = Severe depression   Psychosocial Evaluation and Intervention:   Psychosocial Re-Evaluation:     Psychosocial Re-Evaluation    [place] Name 03/05/17 1106             Psychosocial Re-Evaluation   Current issues with Current Stress Concerns       Comments Adler has been out on days that councelor has been present.  He is doing well.  He has a strong support system between his wife and kids.  He is almost back to normal at work.  His mom has been transferred to [place] and should be going home soon.  That has been his biggest stress and he is excited about her recovery.  He is just letting things slide off his shoulders and not worring as much.   He has been sleeping good when he is able to sleep.  Currently just trying to figure out his schedule to allow for exercise and work.  He now has his daugthter home with him for the summer.       Expected Outcomes Short: Continue to work on staying [place] about his mom.  Long: Positive overall.          Psychosocial Discharge (Final Psychosocial Re-Evaluation):     Psychosocial Re-Evaluation - 03/05/17 1106      Psychosocial Re-Evaluation   Current issues with Current Stress Concerns   Comments Adrik has been out on days that councelor has been present.  He is doing well.  He has a strong support system between his wife and kids.  He is almost back to normal at work.  His mom has been transferred to [place] and  should be going home soon.  That has been his biggest stress and he is excited about her recovery.  He is just letting things slide off his shoulders and not worring as much.   He has been sleeping good when he is able to sleep.  Currently just trying to figure out his schedule to allow for exercise and work.  He now has his daugthter home with him for the summer.   Expected Outcomes Short: Continue to work on staying [place] about his mom.  Long: Positive overall.      Vocational Rehabilitation: Provide vocational rehab assistance to qualifying candidates.   Vocational Rehab Evaluation & Intervention:     Vocational Rehab - 01/14/17 1302      Initial Vocational Rehab Evaluation & Intervention   Assessment shows need for Vocational Rehabilitation No      Education: Education Goals: Education classes will be provided on a weekly basis, covering required topics. Participant will state understanding/return demonstration of topics presented.  Learning Barriers/Preferences:     Learning Barriers/Preferences - 01/14/17 1301      Learning Barriers/Preferences   Learning Barriers None   Learning Preferences Skilled Demonstration      Education Topics: General Nutrition Guidelines/Fats and Fiber: -Group instruction provided by verbal, written material, models and posters to present the general guidelines for heart  healthy nutrition. Gives an explanation and review of dietary fats and fiber.   Controlling Sodium/Reading Food Labels: -Group verbal and written material supporting the discussion of sodium use in heart healthy nutrition. Review and explanation with models, verbal and written materials for utilization of the food label.   Cardiac Rehab from 04/04/2017 in [facility] Cardiac and Pulmonary Rehab  Date  01/29/17  Educator  PI  Instruction Review Code  2- meets goals/outcomes      Exercise Physiology & Risk Factors: - Group verbal and written instruction with models to review  the exercise physiology of the cardiovascular system and associated critical values. Details cardiovascular disease risk factors and the goals associated with each risk factor.   Aerobic Exercise & Resistance Training: - Gives group verbal and written discussion on the health impact of inactivity. On the components of aerobic and resistive training programs and the benefits of this training and how to safely progress through these programs.   Cardiac Rehab from 04/04/2017 in [facility] Cardiac and Pulmonary Rehab  Date  04/04/17  Educator  AS  Instruction Review Code  2- meets goals/outcomes      Flexibility, Balance, General Exercise Guidelines: - Provides group verbal and written instruction on the benefits of flexibility and balance training programs. Provides general exercise guidelines with specific guidelines to those with heart or lung disease. Demonstration and skill practice provided.   Stress Management: - Provides group verbal and written instruction about the health risks of elevated stress, cause of high stress, and healthy ways to reduce stress.   Depression: - Provides group verbal and written instruction on the correlation between heart/lung disease and depressed mood, treatment options, and the stigmas associated with seeking treatment.   Cardiac Rehab from 04/04/2017 in [facility] Cardiac and Pulmonary Rehab  Date  01/31/17  Educator  [facility]  Instruction Review Code  2- meets goals/outcomes      Anatomy & Physiology of the Heart: - Group verbal and written instruction and models provide basic cardiac anatomy and physiology, with the coronary electrical and arterial systems. Review of: AMI, Angina, Valve disease, Heart Failure, Cardiac Arrhythmia, Pacemakers, and the ICD.   Cardiac Rehab from 04/04/2017 in [facility] Cardiac and Pulmonary Rehab  Date  01/24/17  Educator  KS  Instruction Review Code  2- meets goals/outcomes      Cardiac Procedures: - Group verbal and written instruction  and models to describe the testing methods done to diagnose heart disease. Reviews the outcomes of the test results. Describes the treatment choices: Medical Management, Angioplasty, or Coronary Bypass Surgery.   Cardiac Rehab from 04/04/2017 in [facility] Cardiac and Pulmonary Rehab  Date  02/26/17  Educator  SB  Instruction Review Code  2- meets goals/outcomes      Cardiac Medications: - Group verbal and written instruction to review commonly prescribed medications for heart disease. Reviews the medication, class of the drug, and side effects. Includes the steps to properly store meds and maintain the prescription regimen.   Cardiac Rehab from 04/04/2017 in [facility] Cardiac and Pulmonary Rehab  Date  03/05/17 [6/12 part 1 6/14 Part Two]  Educator  sb  Instruction Review Code  2- meets goals/outcomes      Go Sex-Intimacy & Heart Disease, Get SMART - Goal Setting: - Group verbal and written instruction through game format to discuss heart disease and the return to sexual intimacy. Provides group verbal and written material to discuss and apply goal setting through the application of the S.M.A.R.T. Method.   Cardiac  Rehab from 04/04/2017 in [facility] Cardiac and Pulmonary Rehab  Date  02/26/17  Educator  SB  Instruction Review Code  2- meets goals/outcomes      Other Matters of the Heart: - Provides group verbal, written materials and models to describe Heart Failure, Angina, Valve Disease, and Diabetes in the realm of heart disease. Includes description of the disease process and treatment options available to the cardiac patient.   Cardiac Rehab from 04/04/2017 in [facility] Cardiac and Pulmonary Rehab  Date  01/24/17  Educator  KS  Instruction Review Code  2- meets goals/outcomes      Exercise & Equipment Safety: - Individual verbal instruction and demonstration of equipment use and safety with use of the equipment.   Cardiac Rehab from 04/04/2017 in [facility] Cardiac and Pulmonary Rehab  Date  01/14/17   Educator  C. [place],RN  Instruction Review Code  1- partially meets, needs review/practice      Infection Prevention: - Provides verbal and written material to individual with discussion of infection control including proper hand washing and proper equipment cleaning during exercise session.   Cardiac Rehab from 04/04/2017 in [facility] Cardiac and Pulmonary Rehab  Date  01/14/17  Educator  C. Enterkin, RN  Instruction Review Code  2- meets goals/outcomes      Falls Prevention: - Provides verbal and written material to individual with discussion of falls prevention and safety.   Cardiac Rehab from 04/04/2017 in [facility] Cardiac and Pulmonary Rehab  Date  01/14/17  Educator  C. ENterkinRN  Instruction Review Code  1- partially meets, needs review/practice      Diabetes: - Individual verbal and written instruction to review signs/symptoms of diabetes, desired ranges of glucose level fasting, after meals and with exercise. Advice that pre and post exercise glucose checks will be done for 3 sessions at entry of program.   Cardiac Rehab from 04/04/2017 in [facility] Cardiac and Pulmonary Rehab  Date  01/14/17  Educator  C. [place]  Instruction Review Code  1- partially meets, needs review/practice       Knowledge Questionnaire Score:     Knowledge Questionnaire Score - 01/14/17 1310      Knowledge Questionnaire Score   Pre Score 10/28      Core Components/Risk Factors/Patient Goals at Admission:     Personal Goals and Risk Factors at Admission - 01/14/17 1311      Core Components/Risk Factors/Patient Goals on Admission    Weight Management Yes;Weight Maintenance   Intervention Weight Management: Develop a combined nutrition and exercise program designed to reach desired caloric intake, while maintaining appropriate intake of nutrient and fiber, sodium and fats, and appropriate energy expenditure required for the weight goal.;Weight Management: Provide education and appropriate  resources to help participant work on and attain dietary goals.   Admit Weight 183 lb 4.8 oz (83.1 kg)   Expected Outcomes Short Term: Continue to assess and modify interventions until short term weight is achieved;Long Term: Adherence to nutrition and physical activity/exercise program aimed toward attainment of established weight goal;Weight Maintenance: Understanding of the daily nutrition guidelines, which includes 25-35% calories from fat, 7% or less cal from saturated fats, less than 200mg  cholesterol, less than 1.5gm of sodium, & 5 or more servings of fruits and vegetables daily   Diabetes Yes   Intervention Provide education about signs/symptoms and action to take for hypo/hyperglycemia.;Provide education about proper nutrition, including hydration, and aerobic/resistive exercise prescription along with prescribed medications to achieve blood glucose in normal ranges: Fasting glucose 65-99  mg/dL   Expected Outcomes Short Term: Participant verbalizes understanding of the signs/symptoms and immediate care of hyper/hypoglycemia, proper foot care and importance of medication, aerobic/resistive exercise and nutrition plan for blood glucose control.;Long Term: Attainment of HbA1C < 7%.   Heart Failure Yes   Intervention Provide a combined exercise and nutrition program that is supplemented with education, support and counseling about heart failure. Directed toward relieving symptoms such as shortness of breath, decreased exercise tolerance, and extremity edema.   Expected Outcomes Improve functional capacity of life;Short term: Attendance in program 2-3 days a week with increased exercise capacity. Reported lower sodium intake. Reported increased fruit and vegetable intake. Reports medication compliance.;Short term: Daily weights obtained and reported for increase. Utilizing diuretic protocols set by physician.;Long term: Adoption of self-care skills and reduction of barriers for early signs and symptoms  recognition and intervention leading to self-care maintenance.   Stress Yes  His Mother was in the hospital same time he was   Intervention Offer individual and/or small group education and counseling on adjustment to heart disease, stress management and health-related lifestyle change. Teach and support self-help strategies.;Refer participants experiencing significant psychosocial distress to appropriate mental health specialists for further evaluation and treatment. When possible, include family members and significant others in education/counseling sessions.   Expected Outcomes Short Term: Participant demonstrates changes in health-related behavior, relaxation and other stress management skills, ability to obtain effective social support, and compliance with psychotropic medications if prescribed.;Long Term: Emotional wellbeing is indicated by absence of clinically significant psychosocial distress or social isolation.      Core Components/Risk Factors/Patient Goals Review:      Goals and Risk Factor Review    Row Name 03/05/17 1037 04/04/17 1044           Core Components/Risk Factors/Patient Goals Review   Personal Goals Review Weight Management/Obesity;Heart Failure;Hypertension;Lipids;Diabetes Stress;Diabetes;Hypertension      Review Ladell has been doing well in rehab.  His weight has been pretty steady.  He does weigh daily and has not had any symptoms of heart failure.  His blood sugars have been around 112 mg/dl and checks daily.  He does not check his blood pressure at home but it has been good while he is here.  Muscab has not had any problems with his medications and feel they are working for him.   Gean states his numbers for FBG and BP have been stable.  His stress is reduced as he is not working as much overtime and he feel she can attend [facility] regularly.        Expected Outcomes Short: Continue to stay on top of weight and heart failure.  Long: Continue to work on risk  factor modifications. Short - Lavoy will be able attend class regularly and graduate.  Long - Treyvone will maintain exercise and stress management after graduation.         Core Components/Risk Factors/Patient Goals at Discharge (Final Review):      Goals and Risk Factor Review - 04/04/17 1044      Core Components/Risk Factors/Patient Goals Review   Personal Goals Review Stress;Diabetes;Hypertension   Review Mozell states his numbers for FBG and BP have been stable.  His stress is reduced as he is not working as much overtime and he feel she can attend [facility] regularly.     Expected Outcomes Short - Kaemon will be able attend class regularly and graduate.  Long - Kass will maintain exercise and stress management after graduation.  ITP Comments:     ITP Comments    Row Name 01/14/17 1123 01/16/17 0619 02/13/17 0901 02/19/17 1443 03/13/17 0539   ITP Comments ITP Created during Medical review Documentation DX of 12/19/2016 [place] [facility] Note 30 day review. Continue with ITP unless directed changes per Medical Director review   New to program has attended medical review 30 day review. Continue with ITP unless directed changes per Medical Director review  new to program  Called to check on status of return.  Chanson called out last week with cold and yesterday for same reason.  Left message on machine. 30 day review. Continue with ITP unless directed changes per Medical Director review   [place] Name 03/19/17 1415 04/08/17 1642 04/10/17 0645 04/16/17 1453 05/02/17 1027   ITP Comments Attempted to check on status. Unable to recieve calls at this time. Ason called to let us know that he was just leaving the emergency room after pulling back muscle and getting a cortisone injection.  He will be out tomorrow and will miss Thursday as well due to a doctor's appointment.  He hopes to return next week.  30 day review. Continue with ITP unless directed changes per Medical Director review     Cederick was in the ED on 7/22 for back and shoulder pain again. He has a follow appointment on 7/30.  XRay showed over growth of bone on shoulder blade.  We will await his return after the appointment next week.  Called to check on status of return.  Left message on voicemail   Row Name 05/08/17 3244 05/08/17 1453 05/09/17 1036       ITP Comments 30 day review. Continue with ITP unless directed changes per Medical Director review  Last visit 04/04/2017   last visit prior was 6/19 Called to check on status of return to program.  He had told his doctor that he was not able to return to class with his schedule and his left shoulder injury.  Left him a message to call to confirm before we officially discharge him.  Araceli returned my call.  Still having lots of pain in his shoulder. The meds he's on for his shoulder are making him tired. He is also still working. He will discharge from the program a for now and hopes to return in the future.         Comments: Discharge ITP

## 2017-05-09 NOTE — Telephone Encounter (Signed)
Miranda returned my call.  Still having lots of pain in his shoulder. The meds he's on for his shoulder are making him tired. He is also still working. He will discharge from the program a for now and hopes to return in the future.

## 2017-05-10 DIAGNOSIS — M79602 Pain in left arm: Secondary | ICD-10-CM | POA: Diagnosis not present

## 2017-05-14 DIAGNOSIS — M79602 Pain in left arm: Secondary | ICD-10-CM | POA: Diagnosis not present

## 2017-05-16 DIAGNOSIS — M79602 Pain in left arm: Secondary | ICD-10-CM | POA: Diagnosis not present

## 2017-05-21 DIAGNOSIS — M79602 Pain in left arm: Secondary | ICD-10-CM | POA: Diagnosis not present

## 2017-05-28 ENCOUNTER — Other Ambulatory Visit: Payer: Self-pay | Admitting: Student

## 2017-05-28 ENCOUNTER — Ambulatory Visit (INDEPENDENT_AMBULATORY_CARE_PROVIDER_SITE_OTHER): Payer: 59

## 2017-05-28 ENCOUNTER — Other Ambulatory Visit: Payer: Self-pay

## 2017-05-28 DIAGNOSIS — R9430 Abnormal result of cardiovascular function study, unspecified: Secondary | ICD-10-CM

## 2017-05-28 DIAGNOSIS — I509 Heart failure, unspecified: Secondary | ICD-10-CM

## 2017-05-28 DIAGNOSIS — M5412 Radiculopathy, cervical region: Secondary | ICD-10-CM

## 2017-05-28 DIAGNOSIS — M79602 Pain in left arm: Secondary | ICD-10-CM | POA: Diagnosis not present

## 2017-05-31 ENCOUNTER — Inpatient Hospital Stay: Payer: 59 | Attending: Oncology | Admitting: Oncology

## 2017-05-31 ENCOUNTER — Inpatient Hospital Stay: Payer: 59

## 2017-05-31 ENCOUNTER — Encounter: Payer: Self-pay | Admitting: Internal Medicine

## 2017-05-31 VITALS — BP 127/81 | HR 81 | Temp 94.8°F | Resp 18 | Wt 188.4 lb

## 2017-05-31 DIAGNOSIS — Z9884 Bariatric surgery status: Secondary | ICD-10-CM | POA: Diagnosis not present

## 2017-05-31 DIAGNOSIS — Z794 Long term (current) use of insulin: Secondary | ICD-10-CM | POA: Diagnosis not present

## 2017-05-31 DIAGNOSIS — I2510 Atherosclerotic heart disease of native coronary artery without angina pectoris: Secondary | ICD-10-CM

## 2017-05-31 DIAGNOSIS — I252 Old myocardial infarction: Secondary | ICD-10-CM

## 2017-05-31 DIAGNOSIS — M79602 Pain in left arm: Secondary | ICD-10-CM | POA: Diagnosis not present

## 2017-05-31 DIAGNOSIS — R0602 Shortness of breath: Secondary | ICD-10-CM | POA: Diagnosis not present

## 2017-05-31 DIAGNOSIS — Z800 Family history of malignant neoplasm of digestive organs: Secondary | ICD-10-CM

## 2017-05-31 DIAGNOSIS — R5383 Other fatigue: Secondary | ICD-10-CM

## 2017-05-31 DIAGNOSIS — Z79899 Other long term (current) drug therapy: Secondary | ICD-10-CM

## 2017-05-31 DIAGNOSIS — D509 Iron deficiency anemia, unspecified: Secondary | ICD-10-CM | POA: Diagnosis not present

## 2017-05-31 DIAGNOSIS — I509 Heart failure, unspecified: Secondary | ICD-10-CM

## 2017-05-31 DIAGNOSIS — D508 Other iron deficiency anemias: Secondary | ICD-10-CM

## 2017-05-31 DIAGNOSIS — I429 Cardiomyopathy, unspecified: Secondary | ICD-10-CM

## 2017-05-31 DIAGNOSIS — I447 Left bundle-branch block, unspecified: Secondary | ICD-10-CM

## 2017-05-31 LAB — CBC
HEMATOCRIT: 34.4 % — AB (ref 40.0–52.0)
HEMOGLOBIN: 11.5 g/dL — AB (ref 13.0–18.0)
MCH: 29.8 pg (ref 26.0–34.0)
MCHC: 33.4 g/dL (ref 32.0–36.0)
MCV: 89.1 fL (ref 80.0–100.0)
Platelets: 215 10*3/uL (ref 150–440)
RBC: 3.86 MIL/uL — ABNORMAL LOW (ref 4.40–5.90)
RDW: 13.4 % (ref 11.5–14.5)
WBC: 4.3 10*3/uL (ref 3.8–10.6)

## 2017-05-31 LAB — IRON AND TIBC
IRON: 75 ug/dL (ref 45–182)
Saturation Ratios: 32 % (ref 17.9–39.5)
TIBC: 237 ug/dL — ABNORMAL LOW (ref 250–450)
UIBC: 162 ug/dL

## 2017-05-31 LAB — FERRITIN: Ferritin: 105 ng/mL (ref 24–336)

## 2017-05-31 NOTE — Progress Notes (Signed)
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 for symptoms of bright red blood per rectum. Patient also had some epigastric pain in the past and was on NSAIDs at that time which was stopped. Patient's father died of colon cancer. Patient has a history of gastric bypass about 3 years ago. Last CBC from 12/05/2016 showed white count of 4.9, H&H of 8.1/24.1 and a platelet count of 349. Ferritin was low at 8. Serum iron was low at 15 and iron saturation was low at 5%. TIBC was normal at 295. H pylori stool antigen was negative. B12 folate and TSH were within normal limits. EGD on 12/03/2016 showed: Esophagus was normal. Evidence of gastric bypass. Otherwise normal findings of gastric bypass. Biopsies were taken for evaluation of celiac disease. Colonoscopy was normal. Patient also has a history of cardiomyopathy with an EF of 35% and during his admission for iron deficiency anemia and he was also found to have left bundle branch block and underwent cardiac catheterization which did not show any abnormalities. Patient has also had episodes of melena in February 2018 for which she was admitted to [facility] and was given PPI but did not undergo any EGD or colonoscopy at that time. Capsule study has also been completed.  pateint has received 2 doses of feraheme so far  recent ECHO from last week showed ef of 20-25%  Interval history- denies any blood in stool or urine. Continues to work but  [employer] and tired sometimes. No leg swelling  ECOG PS- 1 Pain scale- 0  Review of systems- Review of Systems  Constitutional: Negative for chills, fever, malaise/fatigue and weight loss.  HENT: Negative for congestion, ear discharge and nosebleeds.   Eyes: Negative for blurred vision.  Respiratory: Negative for cough, hemoptysis, sputum production, shortness of breath and wheezing.   Cardiovascular: Negative for chest pain, palpitations, orthopnea and claudication.  Gastrointestinal: Negative for abdominal pain, blood in stool, constipation, diarrhea, heartburn, melena, nausea and vomiting.  Genitourinary: Negative for dysuria, flank pain, frequency, hematuria and urgency.  Musculoskeletal: Negative for back pain, joint pain and myalgias.  Skin: Negative for rash.  Neurological: Negative for dizziness, tingling, focal weakness, seizures, weakness and headaches.  Endo/Heme/Allergies: Does not bruise/bleed easily.  Psychiatric/Behavioral: Negative for depression and suicidal ideas. The patient does not have insomnia.       Allergies  Allergen Reactions  . Mobic [Meloxicam] Other (See Comments)    Ulcers in stomach eruption   . Diclofenac Other (See Comments)    Fast heart beat,, CP,SOB and weakness on one side.     Past Medical History:  Diagnosis Date  . Acute blood loss anemia 11/20/2016  . CHF (congestive heart failure) ([place])   . Coronary artery disease   . Diabetes mellitus type I ([place])   . Slipped intervertebral disc    L4 L5  . STEMI (ST elevation myocardial infarction) [facility])      Past Surgical History:  Procedure Laterality Date  . COLONOSCOPY WITH PROPOFOL N/A 12/03/2016  Procedure: COLONOSCOPY WITH PROPOFOL;  Surgeon: [name], MD;  Location: [facility] ENDOSCOPY;  Service: Endoscopy;  Laterality: N/A;  . ESOPHAGOGASTRODUODENOSCOPY (EGD) WITH PROPOFOL N/A 12/03/2016   Procedure: ESOPHAGOGASTRODUODENOSCOPY (EGD) WITH PROPOFOL;  Surgeon: [name], MD;  Location:  [facility] ENDOSCOPY;  Service: Endoscopy;  Laterality: N/A;  . GASTRIC BYPASS    . GIVENS CAPSULE STUDY N/A 01/07/2017   Procedure: GIVENS CAPSULE STUDY;  Surgeon: [name], MD;  Location: [facility] ENDOSCOPY;  Service: Endoscopy;  Laterality: N/A;  . LEFT HEART CATH AND CORONARY ANGIOGRAPHY N/A 12/04/2016   Procedure: Left Heart Cath and Coronary Angiography;  Surgeon: [name], MD;  Location: [place] CV LAB;  Service: Cardiovascular;  Laterality: N/A;    Social History   Social History  . Marital status: Married    Spouse name: N/A  . Number of children: N/A  . Years of education: N/A   Occupational History  . 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 Smoking status: Never Smoker  . Smokeless tobacco: Never Used  . Alcohol use No  . Drug use: No  . Sexual activity: Yes   Other Topics Concern  . Not on file   Social History Narrative   Regular exercise-yes, walking one mile per day   Diet: fast food, diet soda, unsweeted tea    Family History  Problem Relation Age of Onset  . Diabetes Mother   . Hypertension Mother   . Cancer Mother        breast  . Diabetes Father   . Hypertension Father   . Cancer Father        colon  . Cancer Maternal Grandfather        prostate  . Stroke Maternal Grandfather        CVA     Current Outpatient Prescriptions:  .  calcium carbonate (OS-CAL) 600 MG TABS tablet, Take 600 mg by mouth 2 (two) times daily with a meal., Disp: , Rfl:  .  carvedilol (COREG) 6.25 MG tablet, Take 1 tablet (6.25 mg total) by mouth 2 (two) times daily., Disp: 180 tablet, Rfl: 3 .  furosemide (LASIX) 20 MG tablet, Take 20 mg by mouth as needed., Disp: , Rfl:  .  Garlic 161 MG TABS, Take 100 mg by mouth daily., Disp: , Rfl:  .  glucose blood (ONE TOUCH ULTRA TEST) test strip, Check blood sugar once daily and as instructed. Dx E11.9, Disp: 100 each, Rfl: 1 .  Insulin Glargine (TOUJEO SOLOSTAR) 300 UNIT/ML SOPN,  Inject 15 Units into the skin daily., Disp: , Rfl:  .  metFORMIN (GLUCOPHAGE) 1000 MG tablet, Take 1,000 mg by mouth 2 (two) times daily with a meal., Disp: , Rfl:  .  Multiple Vitamins-Minerals (MENS MULTI VITAMIN & MINERAL PO), Take 2 tablets by mouth daily. Men's Vita Fusion, Disp: , Rfl:  .  Naproxen Sodium (ALEVE) 220 MG CAPS, Take 2 capsules by mouth as needed., Disp: , Rfl:  .  Omega-3 Fatty Acids (FISH OIL) 1200 MG CAPS, Take 1 capsule by mouth daily., Disp: , Rfl:  .  omeprazole (PRILOSEC) 40 MG capsule, Take 40 mg by mouth daily., Disp: , Rfl:  .  sacubitril-valsartan (ENTRESTO) 49-51 MG, Take 1 tablet by mouth 2 (two) times daily., Disp: 60 tablet, Rfl: 5 .  traMADol (ULTRAM) 50 MG tablet, Take by mouth every 6 (six) hours as needed., Disp: , Rfl:   Physical exam:  Vitals:   05/31/17 0932  BP: 127/81  Pulse: 81  Resp: 18  Temp: (!) 94.8 F (34.9 C)  TempSrc: Tympanic  Weight: 188 lb 6.4 oz (85.5 kg)   Physical Exam  Constitutional: He is oriented to person, place, and time and well-developed, well-nourished, and in no distress.  HENT:  Head: Normocephalic and atraumatic.  Eyes: Pupils are equal, round, and reactive to light. EOM are normal.  Neck: Normal range of motion.  Cardiovascular: Normal rate, regular rhythm and normal heart sounds.   Pulmonary/Chest: Effort normal and breath sounds normal.  Abdominal: Soft. Bowel sounds are normal.  Neurological: He is alert and oriented to person, place, and time.  Skin: Skin is warm and dry.     CMP Latest Ref Rng & Units 03/22/2017  Glucose 65 - 99 mg/dL 174(H)  BUN 6 - 20 mg/dL 18  Creatinine 0.61 - 1.24 mg/dL 0.98  Sodium 135 - 145 mmol/L 139  Potassium 3.5 - 5.1 mmol/L 4.0  Chloride 101 - 111 mmol/L 104  CO2 22 - 32 mmol/L 29  Calcium 8.9 - 10.3 mg/dL 8.9  Total Protein 6.0 - 8.3 g/dL -  Total Bilirubin 0.2 - 1.2 mg/dL -  Alkaline Phos 39 - 117 U/L -  AST 0 - 37 U/L -  ALT 0 - 53 U/L -   CBC Latest Ref Rng &  Units 05/31/2017  WBC 3.8 - 10.6 K/uL 4.3  Hemoglobin 13.0 - 18.0 g/dL 11.5(L)  Hematocrit 40.0 - 52.0 % 34.4(L)  Platelets 150 - 440 K/uL 215      Assessment and plan- Patient is a 46 y.o. male with iron deficiency anemia due to gastric bypass surgery  1. Anemia- previously hb was around 8 due to iron deficiency which improved to 11 after IV iron. Still not normalized likely due to ongoing CHF. Recent ECHO showed EF of 20-25%.  H/H is stable around 11. Iron studies are pending. No need for IV iron today. Repeat cbc ferritin and iron studies in 4 months and 8 months and I will see him in 8 months. Also check B12 that time.    Visit Diagnosis 1. History of bariatric surgery   2. Iron deficiency anemia, unspecified iron deficiency anemia type      Dr. Randa Evens, MD, MPH [facility] Pager- [phone] 05/31/2017 9:46 AM

## 2017-05-31 NOTE — Telephone Encounter (Signed)
Called and spoke with patient. Patient verbalized understanding of echo results and his follow up appointment on 06/04/17 with [name], NP.

## 2017-05-31 NOTE — Progress Notes (Signed)
Here for follow up

## 2017-06-01 ENCOUNTER — Ambulatory Visit
Admission: RE | Admit: 2017-06-01 | Discharge: 2017-06-01 | Disposition: A | Discharge status: Home / Self Care | Payer: 59 | Source: Ambulatory Visit | Attending: Student | Admitting: Student

## 2017-06-01 DIAGNOSIS — M4802 Spinal stenosis, cervical region: Secondary | ICD-10-CM | POA: Diagnosis not present

## 2017-06-01 DIAGNOSIS — M50222 Other cervical disc displacement at C5-C6 level: Secondary | ICD-10-CM | POA: Diagnosis not present

## 2017-06-01 DIAGNOSIS — M5412 Radiculopathy, cervical region: Secondary | ICD-10-CM

## 2017-06-01 DIAGNOSIS — M542 Cervicalgia: Secondary | ICD-10-CM | POA: Diagnosis not present

## 2017-06-04 ENCOUNTER — Ambulatory Visit (INDEPENDENT_AMBULATORY_CARE_PROVIDER_SITE_OTHER): Payer: 59 | Admitting: Nurse Practitioner

## 2017-06-04 ENCOUNTER — Encounter: Payer: Self-pay | Admitting: Nurse Practitioner

## 2017-06-04 ENCOUNTER — Telehealth: Payer: Self-pay | Admitting: Internal Medicine

## 2017-06-04 ENCOUNTER — Other Ambulatory Visit: Payer: Self-pay

## 2017-06-04 VITALS — BP 120/80 | HR 78 | Ht 73.0 in | Wt 189.5 lb

## 2017-06-04 DIAGNOSIS — I5042 Chronic combined systolic (congestive) and diastolic (congestive) heart failure: Secondary | ICD-10-CM | POA: Diagnosis not present

## 2017-06-04 DIAGNOSIS — I428 Other cardiomyopathies: Secondary | ICD-10-CM

## 2017-06-04 MED ORDER — SPIRONOLACTONE 25 MG PO TABS: 12.5000 mg | ORAL_TABLET | Freq: Every day | ORAL | 3 refills | Status: DC

## 2017-06-04 NOTE — Progress Notes (Signed)
Received repeated messages that rx cannot be sent electronically. Multiple attempts to call [facility], but line is busy. Routed rx to [phone].

## 2017-06-04 NOTE — Patient Instructions (Signed)
Medication Instructions:  Please start spironolactone 12.5 mg (half pill) once daily  Labwork: BMET today BMET in 1 week  Referrals: Referral to Dr. Caryl Comes for eval for ICD placement  Follow-Up: With Dr. Saunders Revel in December  If you need a refill on your cardiac medications before your next appointment, please call your pharmacy.

## 2017-06-04 NOTE — Progress Notes (Signed)
Attempted to call rx to [facility], but line is busy x 3 attempts.  Resubmitted electronically.

## 2017-06-04 NOTE — Progress Notes (Signed)
Office Visit    Patient Name: Philip Ponce Date of Encounter: 06/04/2017  Primary Care Provider:  [name], MD Primary Cardiologist: Andree Coss, MD   Chief Complaint    46 year old male with a history of diabetes, nonischemic cardiomyopathy, and chronic combined heart failure, who presents for follow-up.  Past Medical History    Past Medical History:  Diagnosis Date  . Acute blood loss anemia 11/20/2016  . Chronic combined systolic (congestive) and diastolic (congestive) heart failure ([place])    a. 11/2016 Ehco: EF 20-25%, glob HK, antsept, ant, apical HK, Gr1 DD, mild MR, mildly dil LA, nl RV fxn; b. 05/2017 Echo: Ef 20-25%, Gr2 DD, prominent apical trabeculations.  . Diabetes mellitus ([place])   . NICM (nonischemic cardiomyopathy) ([place])    a. 11/2016 Cath: nl cors, EF 25-30%; b. 11/2016 Echo: EF 20-25%; c. 05/2017 Echo: EF 20-25%.  . Slipped intervertebral disc    L4 L5  . STEMI (ST elevation myocardial infarction) ([place])    a. 11/2016 ST elevation -->Nl cors on cath.   Past Surgical History:  Procedure Laterality Date  . COLONOSCOPY WITH PROPOFOL N/A 12/03/2016   Procedure: COLONOSCOPY WITH PROPOFOL;  Surgeon: [name], MD;  Location: [facility] ENDOSCOPY;  Service: Endoscopy;  Laterality: N/A;  . ESOPHAGOGASTRODUODENOSCOPY (EGD) WITH PROPOFOL N/A 12/03/2016   Procedure: ESOPHAGOGASTRODUODENOSCOPY (EGD) WITH PROPOFOL;  Surgeon: [name], MD;  Location: [facility] ENDOSCOPY;  Service: Endoscopy;  Laterality: N/A;  . GASTRIC BYPASS    . GIVENS CAPSULE STUDY N/A 01/07/2017   Procedure: GIVENS CAPSULE STUDY;  Surgeon: [name], MD;  [facility] END[facility];  Service: Endoscopy;  Laterality: N/A;  . LEFT HEART CATH AND CORONARY ANGIOGRAPHY N/A 12/04/2016   Procedure: Left Heart Cath and Coronary Angiography;  Surgeon: [name], MD;  Location: [place] CV LAB;  Service: Cardiovascular;  Laterality: N/A;    Allergies  Allergies  Allergen Reactions  . Mobic [Meloxicam]  Other (See Comments)    Ulcers in stomach eruption   . Diclofenac Other (See Comments)    Fast heart beat,, CP,SOB and weakness on one side.    History of Present Illness    46 year old male with prior history of diabetes, cardiomyopathy, gastric bypass, and GI bleed in February 2018. That occurred in the setting of frequent NSAID usage. Following gastric bypass surgery in [place] in the past, he was found to have decreased EF. Catheterization at that time reportedly showed no significant CAD and by patient report, he had subsequent recovery of LV function. In March 2018, he was admitted to [facility] with chest pain, dyspnea, and left bundle branch block. Catheterization was performed and showed normal coronary arteries. Echo showed LV dysfunction with an EF of 20-25%. He was volume overloaded and required diuresis.  He was subsequently placed on beta blocker and Entresto therapy with titration of Entresto as an outpatient. He was last seen in cardiology clinic on May 31, while he has been seen in heart failure clinic on several occasions.  An echocardiogram was repeated earlier this month that showed persistent LV dysfunction with an EF of 20-25% and grade 2 diastolic dysfunction.  Patient says that since his last visit, his weight has been stable, though he has only been checking his weight once a week or less. He has not had any significant edema. He has been dealing with left shoulder pain which he has been told is secondary to pinched nerve. Sometimes this pain moves into his left upper chest. It is  worse with position changes, palpation, and routine movements of the left shoulder. He has had some dyspnea on exertion which she feels is improved when he takes Lasix. As result, he has been taking Lasix daily over the past week.  He denies PND, orthopnea, dizziness, syncope, or early satiety. He is interested in EP referral for ICD therapy.  Home Medications    Prior to Admission medications    Medication Sig Start Date End Date Taking? Authorizing Provider  calcium carbonate (OS-CAL) 600 MG TABS tablet Take 600 mg by mouth 2 (two) times daily with a meal.   Yes [provider]  carvedilol (COREG) 6.25 MG tablet Take 1 tablet (6.25 mg total) by mouth 2 (two) times daily. 02/21/17  Yes End, Harrell Gave, MD  furosemide (LASIX) 20 MG tablet Take 20 mg by mouth as needed.   Yes [provider]  glucose blood (ONE TOUCH ULTRA TEST) test strip Check blood sugar once daily and as instructed. Dx E11.9 01/19/16  Yes Bedsole, Amy E, MD  Insulin Glargine (TOUJEO SOLOSTAR) 300 UNIT/ML SOPN Inject 15 Units into the skin daily.   Yes [provider]  metFORMIN (GLUCOPHAGE) 1000 MG tablet Take 1,000 mg by mouth 2 (two) times daily with a meal.   Yes [provider]  Multiple Vitamins-Minerals (MENS MULTI VITAMIN & MINERAL PO) Take 2 tablets by mouth daily. Men's Vita Fusion   Yes [provider]  Naproxen Sodium (ALEVE) 220 MG CAPS Take 2 capsules by mouth as needed.   Yes [provider]  Omega-3 Fatty Acids (FISH OIL) 1200 MG CAPS Take 1 capsule by mouth daily.   Yes [provider]  omeprazole (PRILOSEC) 40 MG capsule Take 40 mg by mouth daily.   Yes [provider]  sacubitril-valsartan (ENTRESTO) 49-51 MG Take 1 tablet by mouth 2 (two) times daily. 02/15/17  Yes Hackney, Otila Kluver A, FNP  traMADol (ULTRAM) 50 MG tablet Take by mouth every 6 (six) hours as needed.   Yes [provider]  spironolactone (ALDACTONE) 25 MG tablet Take 0.5 tablets (12.5 mg total) by mouth daily. 06/04/17 09/02/17  Rogelia Mire, NP    Review of Systems    He has been having left shoulder pain that sometimes moves into his left upper chest. Some dyspnea on exertion improved with Lasix. He denies palpitations, pnd, orthopnea, n, v, dizziness, syncope, edema, weight gain, or early satiety. .  All other systems reviewed and are otherwise  negative except as noted above.  Physical Exam    VS:  BP 120/80 (BP Location: Left Arm, Patient Position: Sitting, Cuff Size: Normal)   Pulse 78   Ht 6\' 1"  (1.854 m)   Wt 189 lb 8 oz (86 kg)   BMI 25.00 kg/m  , BMI Body mass index is 25 kg/m. GEN: Well nourished, well developed, in no acute distress.  HEENT: normal.  Neck: Supple, no JVD, carotid bruits, or masses. Cardiac: RRR, no murmurs, rubs, or gallops. No clubbing, cyanosis, edema.  Radials/DP/PT 2+ and equal bilaterally.  Respiratory:  Respirations regular and unlabored, clear to auscultation bilaterally. GI: Soft, nontender, nondistended, BS + x 4. MS: no deformity or atrophy. Skin: warm and dry, no rash. Neuro:  Strength and sensation are intact. Psych: Normal affect.  Accessory Clinical Findings    ECG - Regular sinus rhythm, 78, first-degree AV block, left bundle branch block  Assessment & Plan    1.  Nonischemic cardiomyopathy/chronic combined systolic and diastolic congestive heart failure: Status  post catheterization in March 2018 revealing normal coronary arteries. He has been on carvedilol and Entresto and tolerating well. He has also been taking Lasix daily for the past week. This was previously prescribed [place] when necessary. He does think it helps to increase his exercise tolerance. Repeat echo earlier this month continues to show LV dysfunction with an EF of 20-25%. I'm going to add spironolactone 12.5 mg daily today. I will check a basic metabolic panel today and also plan to follow up a basic metabolic panel in 1 week. I am referring to electrophysiology for consideration for ICD placement in the setting of ongoing LV dysfunction with left bundle branch block.  2. Essential hypertension: Blood pressure stable at 120/80 today. Though I'm not sure that he would tolerate further titration of Entresto, I will initiate spironolactone as outlined above.  3. Disposition: Follow-up with electrophysiology. Follow-up basic  metabolic panel today and in one week.   Murray Hodgkins, NP 06/04/2017, 12:58 PM

## 2017-06-04 NOTE — Telephone Encounter (Signed)
Patient wife wants to discuss issues patient has been having with dr. Saunders Revel   Patient was told EF was 25 % now and he would be starting a new medication pending lab work.     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  Wife wants to address need for possible fmla and the need for a ppm insertion.

## 2017-06-04 NOTE — Telephone Encounter (Signed)
Spoke w/ pt's wife. She reports that pt was informed during ov w/ [name], NP this am that his ECHO showed that his EF has decreased. Pt c/o being tired and SOB and has significant stress at work.  She states that she feels that due to these factors, pt should be out of work and would like documentation of this to provide to his employer. Advised her that our office can provide him w/ a note excusing him from work today for his appointment, but for him to be out of work for more time, he would need to bring in [facility] paperwork for completion.  She will contact pt's employer to see about getting this paperwork and will bring to our office if they are able to obtain this. Advised of her $25 CIO[employer] fee and she is agreeable to this. Asked her to call us is we can be of further assistance.

## 2017-06-04 NOTE — Telephone Encounter (Signed)
Left message for Lisa to call back. 

## 2017-06-05 ENCOUNTER — Telehealth: Payer: Self-pay | Admitting: Nurse Practitioner

## 2017-06-05 ENCOUNTER — Other Ambulatory Visit: Payer: Self-pay | Admitting: *Deleted

## 2017-06-05 LAB — BASIC METABOLIC PANEL
BUN / CREAT RATIO: 19 (ref 9–20)
BUN: 16 mg/dL (ref 6–24)
CHLORIDE: 103 mmol/L (ref 96–106)
CO2: 25 mmol/L (ref 20–29)
CREATININE: 0.85 mg/dL (ref 0.76–1.27)
Calcium: 9 mg/dL (ref 8.7–10.2)
GFR calc Af Amer: 119 mL/min/{1.73_m2} (ref >59)
GFR calc non Af Amer: 103 mL/min/{1.73_m2} (ref >59)
GLUCOSE: 122 mg/dL — AB (ref 65–99)
POTASSIUM: 4.4 mmol/L (ref 3.5–5.2)
SODIUM: 142 mmol/L (ref 134–144)

## 2017-06-05 MED ORDER — SPIRONOLACTONE 25 MG PO TABS: 12.5000 mg | ORAL_TABLET | Freq: Every day | ORAL | 3 refills | Status: DC

## 2017-06-05 NOTE — Telephone Encounter (Signed)
Requested Prescriptions   Signed Prescriptions Disp Refills  . spironolactone (ALDACTONE) 25 MG tablet 90 tablet 3    Sig: Take 0.5 tablets (12.5 mg total) by mouth daily.    Authorizing Provider: Rogelia Mire    Ordering User: Britt Bottom

## 2017-06-05 NOTE — Telephone Encounter (Signed)
Pt calling stating he was seen here yesterday and  we sent new medication to pharmacy  Spironolactone  But the [facility] never received it for they were out of power Would like to know if we can resend it today   Please advise

## 2017-06-06 ENCOUNTER — Telehealth: Payer: Self-pay | Admitting: *Deleted

## 2017-06-06 NOTE — Telephone Encounter (Signed)
Patient wants to come in to sign advanced directives today

## 2017-06-06 NOTE — Telephone Encounter (Signed)
Unable to reach chaplain after several pages and the Pastoral care dept number states that she is off until Monday. Patient advised that it will be next week before this can be handled and he stated "that is fine"

## 2017-06-07 NOTE — Telephone Encounter (Signed)
Wife will come in and pick up advanced directives forms and take home to read over and either take to bank to be notarized or come back to office to have chaplain complete.

## 2017-06-07 NOTE — Telephone Encounter (Signed)
Unable to reach patient or wife, left message on wife's voice mail

## 2017-06-07 NOTE — Telephone Encounter (Signed)
I spoke with chaplain and he stated to have patient come to office wand just page him when he arrives

## 2017-06-11 ENCOUNTER — Telehealth: Payer: Self-pay | Admitting: Internal Medicine

## 2017-06-11 NOTE — Telephone Encounter (Signed)
  Pt employer would like for Dr. Saunders Revel to write a note to be out of work until 9/24. States pt job is very physical and pt is unable to work. States pt was in the "medical office last night with SOB" This lady is NOT on pt DPR, I did make her aware we are unable to speak with her . She will make pt aware and have him call and give verbal authorization to speak with her.  [phone] Office #

## 2017-06-11 NOTE — Telephone Encounter (Signed)
Pt needs letter stating he needs to be out of work until paperwork is received by Dr. Saunders Revel. Pt nurse at work states pt is not mentally capable of working. Please call.

## 2017-06-11 NOTE — Telephone Encounter (Signed)
Patient says his employer will not allow him to work under his condition.  Patient states he is very tired and unfocused. He will get to work and begin his job but he gets fatigued very easily. 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 Patient said the other night he tried to work and it was unsafe.  He states the "first responders" said he could not continue working as it would be a safety issue for him and fellow employees. The nurse of his employer is trying to help him fill out the necessary paperwork. Employer and patient want to make sure Dr End will back his claim he filed back on 06/05/17 for the need to [facility] and short-term disability until he can complete referral and/or get his device.  This is in order for patient not to lose his job as he is out of "time off."  Patient took the FMLA/STD forms to [facility] today for completion. Discussed with Dr End who advised that patient should not be performing job duties at this time until further evaluation and will fill out form accordingly once received from [facility].

## 2017-06-11 NOTE — Telephone Encounter (Signed)
Called and notified Clarise Cruz of Dr. Darnelle Bos recommendations. She was very appreciative and will notify the patient.

## 2017-06-11 NOTE — Telephone Encounter (Signed)
S/w patient. He recevied his FMLA paperwork in the mail yesterday and is planning to go to his employer's nurse for guidance on what to fill out. He then is planning to bring it to our office to submit to [facility] and for Dr End to complete. Patient verbalized understanding of the process and would bring the paperwork to [place] today.

## 2017-06-13 ENCOUNTER — Other Ambulatory Visit: Payer: Self-pay | Admitting: Student

## 2017-06-13 DIAGNOSIS — M4722 Other spondylosis with radiculopathy, cervical region: Secondary | ICD-10-CM

## 2017-06-14 ENCOUNTER — Telehealth: Payer: Self-pay | Admitting: Nurse Practitioner

## 2017-06-14 NOTE — Telephone Encounter (Signed)
Received paperwork from [place] for [name], NP to review and sign. Handed to Alysia Penna, RN

## 2017-06-14 NOTE — Telephone Encounter (Signed)
CIOX folder given to [name], NP

## 2017-06-17 ENCOUNTER — Encounter: Payer: Self-pay | Admitting: Cardiology

## 2017-06-17 ENCOUNTER — Encounter: Payer: Self-pay | Admitting: *Deleted

## 2017-06-17 ENCOUNTER — Other Ambulatory Visit: Payer: Self-pay | Admitting: Cardiology

## 2017-06-17 ENCOUNTER — Ambulatory Visit (INDEPENDENT_AMBULATORY_CARE_PROVIDER_SITE_OTHER): Payer: 59 | Admitting: Cardiology

## 2017-06-17 VITALS — BP 120/82 | HR 78 | Ht 73.0 in | Wt 191.8 lb

## 2017-06-17 DIAGNOSIS — Z01812 Encounter for preprocedural laboratory examination: Secondary | ICD-10-CM | POA: Diagnosis not present

## 2017-06-17 DIAGNOSIS — I428 Other cardiomyopathies: Secondary | ICD-10-CM

## 2017-06-17 DIAGNOSIS — I5042 Chronic combined systolic (congestive) and diastolic (congestive) heart failure: Secondary | ICD-10-CM | POA: Diagnosis not present

## 2017-06-17 DIAGNOSIS — I10 Essential (primary) hypertension: Secondary | ICD-10-CM | POA: Diagnosis not present

## 2017-06-17 NOTE — Patient Instructions (Signed)
Medication Instructions:    Your physician recommends that you continue on your current medications as directed. Please refer to the Current Medication list given to you today.  --- If you need a refill on your cardiac medications before your next appointment, please call your pharmacy. ---  Labwork:  None ordered  Testing/Procedures: Your physician has recommended that you have a defibrillator inserted. An implantable cardioverter defibrillator (ICD) is a small device that is placed in your chest or, in rare cases, your abdomen. This device uses electrical pulses or shocks to help control life-threatening, irregular heartbeats that could lead the heart to suddenly stop beating (sudden cardiac arrest). Leads are attached to the ICD that goes into your heart. This is done in the hospital and usually requires an overnight stay. Please see the instruction sheet given to you today for more information.  Follow-Up:  Your physician recommends that you schedule a wound check appointment 10-14 days, after your procedure on 06/26/2017, with the device clinic.   Your physician recommends that you schedule a follow up appointment in 3 months, after your procedure on 06/26/2017, with Dr. Curt Bears.  Thank you for choosing [facility]!!   Trinidad Curet, RN [phone]  Any Other Special Instructions Will Be Listed Below (If Applicable).   Cardioverter Defibrillator Implantation An implantable cardioverter defibrillator (ICD) is a small, lightweight, battery-powered device that is placed (implanted) under the skin in the chest or abdomen. Your caregiver may prescribe an ICD if:  You have had an irregular heart rhythm (arrhythmia) that originated in the lower chambers of the heart (ventricles).  Your heart has been damaged by a disease (such as coronary artery disease) or heart condition (such as a heart attack). An ICD consists of a battery that lasts several years, a small computer called a  pulse generator, and wires called leads that go into the heart. It is used to detect and correct two dangerous arrhythmias: a rapid heart rhythm (tachycardia) and an arrhythmia in which the ventricles contract in an uncoordinated way (fibrillation). When an ICD detects tachycardia, it sends an electrical signal to the heart that restores the heartbeat to normal (cardioversion). This signal is usually painless. If cardioversion does not work or if the ICD detects fibrillation, it delivers a small electrical shock to the heart (defibrillation) to restart the heart. The shock may feel like a strong jolt in the chest.ICDs may be programmed to correct other problems. Sometimes, ICDs are programmed to act as another type of implantable device called a pacemaker. Pacemakers are used to treat a slow heartbeat (bradycardia). LET YOUR CAREGIVER KNOW ABOUT:  Any allergies you have.  All medicines you are taking, including vitamins, herbs, eyedrops, and over-the-counter medicines and creams.  Previous problems you or members of your family have had with the use of anesthetics.  Any blood disorders you have had.  Other health problems you have. RISKS AND COMPLICATIONS Generally, the procedure to implant an ICD is safe. However, as with any surgical procedure, complications can occur. Possible complications associated with implanting an ICD include:  Swelling, bleeding, or bruising at the site where the ICD was implanted.  Infection at the site where the ICD was implanted.  A reaction to medicine used during the procedure.  Nerve, heart, or blood vessel damage.  Blood clots. BEFORE THE PROCEDURE  You may need to have blood tests, heart tests, or a chest X-ray done before the day of the procedure.  Ask your caregiver about changing or stopping your regular  medicines.  Make plans to have someone drive you home. You may need to stay in the hospital overnight after the procedure.  Stop smoking at  least 24 hours before the procedure.  Take a bath or shower the night before the procedure. You may need to scrub your chest or abdomen with a special type of soap.  Do not eat or drink before your procedure for as long as directed by your caregiver. Ask if it is okay to take any needed medicine with a small sip of water. PROCEDURE  The procedure to implant an ICD in your chest or abdomen is usually done at a hospital in a room that has a large X-ray machine called a fluoroscope. The machine will be above you during the procedure. It will help your caregiver see your heart during the procedure. Implanting an ICD usually takes 1-3 hours. Before the procedure:   Small monitors will be put on your body. They will be used to check your heart, blood pressure, and oxygen level.  A needle will be put into a vein in your hand or arm. This is called an intravenous (IV) access tube. Fluids and medicine will flow directly into your body through the IV tube.  Your chest or abdomen will be cleaned with a germ-killing (antiseptic) solution. The area may be shaved.  You may be given medicine to help you relax (sedative).  You will be given a medicine called a local anesthetic. This medicine will make the surgical site numb while the ICD is implanted. You will be sleepy but awake during the procedure. After you are numb the procedure will begin. The caregiver will:  Make a small cut (incision). This will make a pocket deep under your skin that will hold the pulse generator.  Guide the leads through a large blood vessel into your heart and attach them to the heart muscles. Depending on the ICD, the leads may go into one ventricle or they may go to both ventricles and into an upper chamber of the heart (atrium).  Test the ICD.  Close the incision with stitches, glue, or staples. AFTER THE PROCEDURE  You may feel pain. Some pain is normal. It may last a few days.  You may stay in a recovery area until the  local anesthetic has worn off. Your blood pressure and pulse will be checked often. You will be taken to a room where your heart will be monitored.  A chest X-ray will be taken. This is done to check that the cardioverter defibrillator is in the right place.  You may stay in the hospital overnight.  A slight bump may be seen over the skin where the ICD was placed. Sometimes, it is possible to feel the ICD under the skin. This is normal.  In the months and years afterward, your caregiver will check the device, the leads, and the battery every few months. Eventually, when the battery is low, the ICD will be replaced.   This information is not intended to replace advice given to you by your health care provider. Make sure you discuss any questions you have with your health care provider.   Document Released: 06/02/2002 Document Revised: 07/01/2013 Document Reviewed: 09/29/2012 Elsevier Interactive Patient Education [employer].

## 2017-06-17 NOTE — Progress Notes (Signed)
Electrophysiology Office Note   Date:  06/17/2017   ID:  Philip Ponce, DOB 1971-06-09, MRN [id number]  PCP:  Philip Sanders, MD  Cardiologist:  Philip Primary Electrophysiologist:  Taleya Whitcher Meredith Leeds, MD    Chief Complaint  Patient presents with  . Advice Only    Chronic combined systolic and diastolic HF     History of Present Illness: Philip Ponce is a 45 y.o. male who is being seen today for the evaluation of CHF at the request of Philip, Harrell Gave, MD. Presenting today for electrophysiology evaluation. He has history of diabetes, nonischemic cardiomyopathy, status post gastric bypass. He had a GI bleed that occurred in the setting of NSAIDs. March 2018 he was [facility] pain, dyspnea, and left bundle-branch block. Catheterization showed normal coronary arteries with an echo that showed an EF of 20-25%. He was placed on carvedilol and S2. Repeat echo shows an ejection fraction of 20-25% with grade 2 diastolic dysfunction.  Today, he denies symptoms of palpitations, orthopnea, PND, lower extremity edema, claudication, dizziness, presyncope, syncope, bleeding, or neurologic sequela. The patient is tolerating medications without difficulties. His main symptoms are of shortness of breath and fatigue. He does not have PND or orthopnea, but does get short of breath when he exerts himself. He otherwise is doing well without major complaint.   Past Medical History:  Diagnosis Date  . Acute blood loss anemia 11/20/2016  . Chronic combined systolic (congestive) and diastolic (congestive) heart failure ([place])    a. 11/2016 Ehco: EF 20-25%, glob HK, antsept, ant, apical HK, Gr1 DD, mild MR, mildly dil LA, nl RV fxn; b. 05/2017 Echo: Ef 20-25%, Gr2 DD, prominent apical trabeculations.  . Diabetes mellitus ([place])   . NICM (nonischemic cardiomyopathy) ([place])    a. 11/2016 Cath: nl cors, EF 25-30%; b. 11/2016 Echo: EF 20-25%; c. 05/2017 Echo: EF 20-25%.  . Slipped  intervertebral disc    L4 L5  . STEMI (ST elevation myocardial infarction) ([place])    a. 11/2016 ST elevation -->Nl cors on cath.   Past Surgical History:  Procedure Laterality Date  . COLONOSCOPY WITH PROPOFOL N/A 12/03/2016   Procedure: COLONOSCOPY WITH PROPOFOL;  Surgeon: [name], MD;  Location: [facility] ENDOSCOPY;  Service: Endoscopy;  Laterality: N/A;  . ESOPHAGOGASTRODUODENOSCOPY (EGD) WITH PROPOFOL N/A 12/03/2016   Procedure: ESOPHAGOGASTRODUODENOSCOPY (EGD) WITH PROPOFOL;  Surgeon: [name], MD;  Location: [facility] ENDOSCOPY;  Service: Endoscopy;  Laterality: N/A;  . GASTRIC BYPASS    . GIVENS CAPSULE STUDY N/A 01/07/2017   Procedure: GIVENS CAPSULE STUDY;  Surgeon: [name], MD;  Location: [facility] ENDOSCOPY;  Service: Endoscopy;  Laterality: N/A;  . LEFT HEART CATH AND CORONARY ANGIOGRAPHY N/A 12/04/2016   Procedure: Left Heart Cath and Coronary Angiography;  Surgeon: [name], MD;  Location: [place] CV LAB;  Service: Cardiovascular;  Laterality: N/A;     Current Outpatient Prescriptions  Medication Sig Dispense Refill  . calcium carbonate (OS-CAL) 600 MG TABS tablet Take 600 mg by mouth 2 (two) times daily with a meal.    . carvedilol (COREG) 6.25 MG tablet Take 1 tablet (6.25 mg total) by mouth 2 (two) times daily. 180 tablet 3  . furosemide (LASIX) 20 MG tablet Take 20 mg by mouth as needed.    Marland Kitchen glucose blood (ONE TOUCH ULTRA TEST) test strip Check blood sugar once daily and as instructed. Dx E11.9 100 each 1  . Insulin Glargine (TOUJEO SOLOSTAR) 300 UNIT/ML SOPN Inject 15 Units  into the skin daily.    . metFORMIN (GLUCOPHAGE) 1000 MG tablet Take 1,000 mg by mouth 2 (two) times daily with a meal.    . Multiple Vitamins-Minerals (MENS MULTI VITAMIN & MINERAL PO) Take 2 tablets by mouth daily. Men's Vita Fusion    . Naproxen Sodium (ALEVE) 220 MG CAPS Take 2 capsules by mouth as needed.    . Omega-3 Fatty Acids (FISH OIL) 1200 MG CAPS Take 1 capsule by mouth daily.    Marland Kitchen  omeprazole (PRILOSEC) 40 MG capsule Take 40 mg by mouth daily.    . sacubitril-valsartan (ENTRESTO) 49-51 MG Take 1 tablet by mouth 2 (two) times daily. 60 tablet 5  . spironolactone (ALDACTONE) 25 MG tablet Take 0.5 tablets (12.5 mg total) by mouth daily. 90 tablet 3  . traMADol (ULTRAM) 50 MG tablet Take by mouth every 6 (six) hours as needed.     No current facility-administered medications for this visit.     Allergies:   Mobic [meloxicam] and Diclofenac   Social History:  The patient  reports that he has never smoked. He has never used smokeless tobacco. He reports that he does not drink alcohol or use drugs.   Family History:  The patient's family history includes Cancer in his father, maternal grandfather, and mother; Diabetes in his father and mother; Hypertension in his father and mother; Stroke in his maternal grandfather.    ROS:  Please see the history of present illness.   Otherwise, review of systems is positive for fatigue, chest pain, shortness of breath, cough, depression, anxiety, muscle pain, balance problems.   All other systems are reviewed and negative.    PHYSICAL EXAM: VS:  BP 120/82   Pulse 78   Ht 6\' 1"  (1.854 m)   Wt 191 lb 12.8 oz (87 kg)   SpO2 98%   BMI 25.30 kg/m  , BMI Body mass index is 25.3 kg/m. GEN: Well nourished, well developed, in no acute distress  HEENT: normal  Neck: no JVD, carotid bruits, or masses Cardiac: RRR; no murmurs, rubs, or gallops,no edema  Respiratory:  clear to auscultation bilaterally, normal work of breathing GI: soft, nontender, nondistended, + BS MS: no deformity or atrophy  Skin: warm and dry Neuro:  Strength and sensation are intact Psych: euthymic mood, full affect  EKG:  EKG is not ordered today. Personal review of the ekg ordered 06/04/17 shows sinus rhythm, left bundle branch block  Recent Labs: 11/30/2016: TSH 1.019 12/06/2016: Magnesium 1.9 05/31/2017: Hemoglobin 11.5; Platelets 215 06/04/2017: BUN 16;  Creatinine, Ser 0.85; Potassium 4.4; Sodium 142    Lipid Panel     Component Value Date/Time   CHOL 121 10/07/2015 0845   TRIG 78.0 10/07/2015 0845   HDL 66.70 10/07/2015 0845   CHOLHDL 2 10/07/2015 0845   VLDL 15.6 10/07/2015 0845   LDLCALC 39 10/07/2015 0845     Wt Readings from Last 3 Encounters:  06/17/17 191 lb 12.8 oz (87 kg)  06/04/17 189 lb 8 oz (86 kg)  05/31/17 188 lb 6.4 oz (85.5 kg)      Other studies Reviewed: Additional studies/ records that were reviewed today include: TTE 05/28/17  Review of the above records today demonstrates:  - Left ventricle: The cavity size was moderately dilated. Wall   thickness was normal. Systolic function was severely reduced. The   estimated ejection fraction was in the range of 20% to 25%.   Diffuse hypokinesis. Features are consistent with a pseudonormal   left  ventricular filling pattern, with concomitant abnormal   relaxation and increased filling pressure (grade 2 diastolic   dysfunction). - Pulmonary arteries: Systolic pressure could not be accurately   estimated.  LHC 12/04/16 1. No angiographically significant coronary artery disease. 2. Moderate to severe LV contractile dysfunction (LVEF 30-35%), consistent with non-ischemic cardiomyopathy. 3. Mildly elevated left ventricular filling pressure.   ASSESSMENT AND PLAN:  1.  Systolic heart failure due to nonischemic cardiomyopathy: Currently on optimal medical therapy with carvedilol and interested. He has been taking Lasix as well for volume management. His ejection fraction has been low over 2 separate checks. Recently put on Aldactone 12.5 mg. Had a discussion of ICD therapy. Risks and benefits were discussed. Risks include bleeding, infection, tamponade, pneumothorax. He understands these risks and has agreed to the procedure. He does have a left bundle-branch block and thus we Zygmund Passero implant a CRT-D.  2. Essential hypertension: Blood pressure stable today. No  changes.    Current medicines are reviewed at length with the patient today.   The patient does not have concerns regarding his medicines.  The following changes were made today:  none  Labs/ tests ordered today include:  Orders Placed This Encounter  Procedures  . Basic Metabolic Panel (BMET)  . CBC w/Diff     Disposition:   FU with Kimber Fritts 3 months  Signed, Tahira Olivarez Meredith Leeds, MD  06/17/2017 2:49 PM     [place] [address] [place] [address] [phone] (office) [phone] (fax)

## 2017-06-18 LAB — CBC WITH DIFFERENTIAL/PLATELET
BASOS ABS: 0 10*3/uL (ref 0.0–0.2)
Basos: 1 %
EOS (ABSOLUTE): 0.1 10*3/uL (ref 0.0–0.4)
Eos: 2 %
HEMOGLOBIN: 12 g/dL — AB (ref 13.0–17.7)
Hematocrit: 38.2 % (ref 37.5–51.0)
IMMATURE GRANS (ABS): 0 10*3/uL (ref 0.0–0.1)
IMMATURE GRANULOCYTES: 0 %
LYMPHS: 34 %
Lymphocytes Absolute: 1.6 10*3/uL (ref 0.7–3.1)
MCH: 28.8 pg (ref 26.6–33.0)
MCHC: 31.4 g/dL — ABNORMAL LOW (ref 31.5–35.7)
MCV: 92 fL (ref 79–97)
MONOCYTES: 11 %
Monocytes Absolute: 0.5 10*3/uL (ref 0.1–0.9)
NEUTROS PCT: 52 %
Neutrophils Absolute: 2.4 10*3/uL (ref 1.4–7.0)
PLATELETS: 232 10*3/uL (ref 150–379)
RBC: 4.17 x10E6/uL (ref 4.14–5.80)
RDW: 13 % (ref 12.3–15.4)
WBC: 4.6 10*3/uL (ref 3.4–10.8)

## 2017-06-18 LAB — BASIC METABOLIC PANEL
BUN / CREAT RATIO: 18 (ref 9–20)
BUN: 16 mg/dL (ref 6–24)
CHLORIDE: 103 mmol/L (ref 96–106)
CO2: 27 mmol/L (ref 20–29)
Calcium: 8.8 mg/dL (ref 8.7–10.2)
Creatinine, Ser: 0.89 mg/dL (ref 0.76–1.27)
GFR calc non Af Amer: 101 mL/min/{1.73_m2} (ref >59)
GFR, EST AFRICAN AMERICAN: 117 mL/min/{1.73_m2} (ref >59)
GLUCOSE: 116 mg/dL — AB (ref 65–99)
POTASSIUM: 4.3 mmol/L (ref 3.5–5.2)
Sodium: 141 mmol/L (ref 134–144)

## 2017-06-20 ENCOUNTER — Encounter: Payer: Self-pay | Admitting: Cardiology

## 2017-06-25 ENCOUNTER — Ambulatory Visit
Admission: RE | Admit: 2017-06-25 | Discharge: 2017-06-25 | Disposition: A | Discharge status: Home / Self Care | Payer: 59 | Source: Ambulatory Visit | Attending: Student | Admitting: Student

## 2017-06-25 DIAGNOSIS — M50223 Other cervical disc displacement at C6-C7 level: Secondary | ICD-10-CM | POA: Diagnosis not present

## 2017-06-25 DIAGNOSIS — M4722 Other spondylosis with radiculopathy, cervical region: Secondary | ICD-10-CM

## 2017-06-25 DIAGNOSIS — M50222 Other cervical disc displacement at C5-C6 level: Secondary | ICD-10-CM | POA: Diagnosis not present

## 2017-06-25 MED ORDER — TRIAMCINOLONE ACETONIDE 40 MG/ML IJ SUSP (RADIOLOGY)
60.0000 mg | Freq: Once | INTRAMUSCULAR | Status: AC
  Administered 2017-06-25: 60 mg via EPIDURAL

## 2017-06-25 MED ORDER — IOPAMIDOL (ISOVUE-M 300) INJECTION 61%
1.0000 mL | Freq: Once | INTRAMUSCULAR | Status: AC | PRN
  Administered 2017-06-25: 1 mL via EPIDURAL

## 2017-06-25 NOTE — Discharge Instructions (Signed)

## 2017-06-26 ENCOUNTER — Encounter (HOSPITAL_COMMUNITY): Payer: Self-pay | Admitting: General Practice

## 2017-06-26 ENCOUNTER — Ambulatory Visit (HOSPITAL_COMMUNITY)
Admission: RE | Admit: 2017-06-26 | Discharge: 2017-06-27 | Disposition: A | Discharge status: Home / Self Care | Payer: 59 | Source: Ambulatory Visit | Attending: Cardiology | Admitting: Cardiology

## 2017-06-26 ENCOUNTER — Telehealth: Payer: Self-pay | Admitting: Nurse Practitioner

## 2017-06-26 ENCOUNTER — Encounter (HOSPITAL_COMMUNITY)
Admission: RE | Disposition: A | Discharge status: Home / Self Care | Payer: Self-pay | Source: Ambulatory Visit | Attending: Cardiology

## 2017-06-26 DIAGNOSIS — Z7984 Long term (current) use of oral hypoglycemic drugs: Secondary | ICD-10-CM | POA: Diagnosis not present

## 2017-06-26 DIAGNOSIS — E119 Type 2 diabetes mellitus without complications: Secondary | ICD-10-CM | POA: Insufficient documentation

## 2017-06-26 DIAGNOSIS — I429 Cardiomyopathy, unspecified: Secondary | ICD-10-CM | POA: Diagnosis present

## 2017-06-26 DIAGNOSIS — Z95810 Presence of automatic (implantable) cardiac defibrillator: Secondary | ICD-10-CM

## 2017-06-26 DIAGNOSIS — I509 Heart failure, unspecified: Secondary | ICD-10-CM

## 2017-06-26 DIAGNOSIS — Z79899 Other long term (current) drug therapy: Secondary | ICD-10-CM | POA: Insufficient documentation

## 2017-06-26 DIAGNOSIS — Z23 Encounter for immunization: Secondary | ICD-10-CM | POA: Insufficient documentation

## 2017-06-26 DIAGNOSIS — Z95818 Presence of other cardiac implants and grafts: Secondary | ICD-10-CM

## 2017-06-26 DIAGNOSIS — I428 Other cardiomyopathies: Secondary | ICD-10-CM

## 2017-06-26 DIAGNOSIS — I5022 Chronic systolic (congestive) heart failure: Secondary | ICD-10-CM | POA: Diagnosis not present

## 2017-06-26 DIAGNOSIS — I447 Left bundle-branch block, unspecified: Secondary | ICD-10-CM | POA: Diagnosis not present

## 2017-06-26 DIAGNOSIS — I425 Other restrictive cardiomyopathy: Secondary | ICD-10-CM | POA: Diagnosis not present

## 2017-06-26 HISTORY — DX: Presence of automatic (implantable) cardiac defibrillator: Z95.810

## 2017-06-26 HISTORY — PX: BIV ICD INSERTION CRT-D: EP1195

## 2017-06-26 LAB — GLUCOSE, CAPILLARY
GLUCOSE-CAPILLARY: 321 mg/dL — AB (ref 65–99)
Glucose-Capillary: 188 mg/dL — ABNORMAL HIGH (ref 65–99)

## 2017-06-26 LAB — SURGICAL PCR SCREEN
MRSA, PCR: NEGATIVE
STAPHYLOCOCCUS AUREUS: NEGATIVE

## 2017-06-26 SURGERY — BIV ICD INSERTION CRT-D

## 2017-06-26 MED ORDER — CEFAZOLIN SODIUM-DEXTROSE 2-4 GM/100ML-% IV SOLN
2.0000 g | INTRAVENOUS | Status: AC
  Administered 2017-06-26: 2 g via INTRAVENOUS

## 2017-06-26 MED ORDER — MIDAZOLAM HCL 5 MG/5ML IJ SOLN
INTRAMUSCULAR | Status: DC | PRN
  Administered 2017-06-26 (×13): 1 mg via INTRAVENOUS

## 2017-06-26 MED ORDER — MIDAZOLAM HCL 5 MG/5ML IJ SOLN
INTRAMUSCULAR | Status: AC
Start: 2017-06-26 — End: 2017-06-26
  Filled 2017-06-26: qty 5

## 2017-06-26 MED ORDER — FENTANYL CITRATE (PF) 100 MCG/2ML IJ SOLN
INTRAMUSCULAR | Status: AC
  Filled 2017-06-26: qty 2

## 2017-06-26 MED ORDER — CALCIUM CARBONATE 1250 (500 CA) MG PO TABS
1.0000 | ORAL_TABLET | Freq: Two times a day (BID) | ORAL | Status: DC
  Administered 2017-06-27: 500 mg via ORAL
  Filled 2017-06-26: qty 1

## 2017-06-26 MED ORDER — ONDANSETRON HCL 4 MG/2ML IJ SOLN: 4.0000 mg | Freq: Four times a day (QID) | INTRAMUSCULAR | Status: DC | PRN

## 2017-06-26 MED ORDER — INSULIN GLARGINE 100 UNIT/ML ~~LOC~~ SOLN: 15.0000 [IU] | Freq: Every day | SUBCUTANEOUS | Status: DC

## 2017-06-26 MED ORDER — CEFAZOLIN SODIUM-DEXTROSE 2-4 GM/100ML-% IV SOLN
INTRAVENOUS | Status: AC
  Filled 2017-06-26: qty 100

## 2017-06-26 MED ORDER — IOPAMIDOL (ISOVUE-370) INJECTION 76%
INTRAVENOUS | Status: DC | PRN
  Administered 2017-06-26 (×2): 15 mL via INTRAVENOUS
  Administered 2017-06-26: 20 mL via INTRAVENOUS

## 2017-06-26 MED ORDER — TRAMADOL HCL 50 MG PO TABS: 50.0000 mg | ORAL_TABLET | Freq: Four times a day (QID) | ORAL | Status: DC | PRN

## 2017-06-26 MED ORDER — PANTOPRAZOLE SODIUM 40 MG PO TBEC
40.0000 mg | DELAYED_RELEASE_TABLET | Freq: Every day | ORAL | Status: DC
  Administered 2017-06-26: 40 mg via ORAL
  Filled 2017-06-26: qty 1

## 2017-06-26 MED ORDER — INFLUENZA VAC SPLIT QUAD 0.5 ML IM SUSY
0.5000 mL | PREFILLED_SYRINGE | INTRAMUSCULAR | Status: AC
  Administered 2017-06-27: 0.5 mL via INTRAMUSCULAR
  Filled 2017-06-26: qty 0.5

## 2017-06-26 MED ORDER — SODIUM CHLORIDE 0.9 % IV SOLN
INTRAVENOUS | Status: DC
  Administered 2017-06-26: 11:00:00 via INTRAVENOUS

## 2017-06-26 MED ORDER — METFORMIN HCL 500 MG PO TABS: 1000.0000 mg | ORAL_TABLET | Freq: Two times a day (BID) | ORAL | Status: DC

## 2017-06-26 MED ORDER — INSULIN ASPART 100 UNIT/ML ~~LOC~~ SOLN
0.0000 [IU] | Freq: Three times a day (TID) | SUBCUTANEOUS | Status: DC
  Administered 2017-06-27: 3 [IU] via SUBCUTANEOUS

## 2017-06-26 MED ORDER — SACUBITRIL-VALSARTAN 49-51 MG PO TABS
1.0000 | ORAL_TABLET | Freq: Two times a day (BID) | ORAL | Status: DC
  Administered 2017-06-26: 1 via ORAL
  Filled 2017-06-26 (×2): qty 1

## 2017-06-26 MED ORDER — SODIUM CHLORIDE 0.9 % IR SOLN
80.0000 mg | Status: AC
  Administered 2017-06-26: 80 mg

## 2017-06-26 MED ORDER — LIDOCAINE HCL (PF) 1 % IJ SOLN
INTRAMUSCULAR | Status: DC | PRN
  Administered 2017-06-26: 20 mL via INTRADERMAL

## 2017-06-26 MED ORDER — FENTANYL CITRATE (PF) 100 MCG/2ML IJ SOLN
INTRAMUSCULAR | Status: DC | PRN
  Administered 2017-06-26 (×6): 25 ug via INTRAVENOUS
  Administered 2017-06-26: 12.5 ug via INTRAVENOUS
  Administered 2017-06-26 (×2): 25 ug via INTRAVENOUS
  Administered 2017-06-26: 12.5 ug via INTRAVENOUS
  Administered 2017-06-26: 25 ug via INTRAVENOUS

## 2017-06-26 MED ORDER — SPIRONOLACTONE 25 MG PO TABS
12.5000 mg | ORAL_TABLET | Freq: Every day | ORAL | Status: DC
  Filled 2017-06-26: qty 1

## 2017-06-26 MED ORDER — SODIUM CHLORIDE 0.9 % IV SOLN
INTRAVENOUS | Status: AC | PRN
Start: 2017-06-26 — End: 2017-06-26
  Administered 2017-06-26 (×2): 250 mL via INTRAVENOUS
  Administered 2017-06-26: 150 mL via INTRAVENOUS

## 2017-06-26 MED ORDER — SODIUM CHLORIDE 0.9 % IR SOLN
Status: AC
  Filled 2017-06-26: qty 2

## 2017-06-26 MED ORDER — MUPIROCIN 2 % EX OINT
TOPICAL_OINTMENT | CUTANEOUS | Status: AC
  Administered 2017-06-26: 11:00:00
  Filled 2017-06-26: qty 22

## 2017-06-26 MED ORDER — BUPIVACAINE HCL (PF) 0.25 % IJ SOLN
INTRAMUSCULAR | Status: DC | PRN
Start: 2017-06-26 — End: 2017-06-26

## 2017-06-26 MED ORDER — SODIUM CHLORIDE 0.9 % IJ SOLN
INTRAMUSCULAR | Status: AC
  Filled 2017-06-26: qty 50

## 2017-06-26 MED ORDER — MIDAZOLAM HCL 5 MG/5ML IJ SOLN
INTRAMUSCULAR | Status: AC
  Filled 2017-06-26: qty 5

## 2017-06-26 MED ORDER — INSULIN ASPART 100 UNIT/ML ~~LOC~~ SOLN: 0.0000 [IU] | Freq: Three times a day (TID) | SUBCUTANEOUS | Status: DC

## 2017-06-26 MED ORDER — LIDOCAINE HCL 2 % IJ SOLN
INTRAMUSCULAR | Status: AC
  Filled 2017-06-26: qty 40

## 2017-06-26 MED ORDER — CALCIUM CARBONATE 600 MG PO TABS: 600.0000 mg | ORAL_TABLET | Freq: Two times a day (BID) | ORAL | Status: DC

## 2017-06-26 MED ORDER — METFORMIN HCL 500 MG PO TABS
1000.0000 mg | ORAL_TABLET | Freq: Two times a day (BID) | ORAL | Status: DC
  Administered 2017-06-27: 1000 mg via ORAL
  Filled 2017-06-26: qty 2

## 2017-06-26 MED ORDER — ACETAMINOPHEN 325 MG PO TABS
325.0000 mg | ORAL_TABLET | ORAL | Status: DC | PRN
  Administered 2017-06-26 – 2017-06-27 (×2): 650 mg via ORAL
  Filled 2017-06-26 (×2): qty 2

## 2017-06-26 MED ORDER — INSULIN ASPART 100 UNIT/ML ~~LOC~~ SOLN
0.0000 [IU] | Freq: Every day | SUBCUTANEOUS | Status: DC
Start: 2017-06-26 — End: 2017-06-27
  Administered 2017-06-26: 4 [IU] via SUBCUTANEOUS

## 2017-06-26 MED ORDER — IOPAMIDOL (ISOVUE-370) INJECTION 76%
INTRAVENOUS | Status: AC
  Filled 2017-06-26: qty 50

## 2017-06-26 MED ORDER — CEFAZOLIN SODIUM-DEXTROSE 1-4 GM/50ML-% IV SOLN
1.0000 g | Freq: Four times a day (QID) | INTRAVENOUS | Status: DC
  Administered 2017-06-26 – 2017-06-27 (×2): 1 g via INTRAVENOUS
  Filled 2017-06-26 (×3): qty 50

## 2017-06-26 MED ORDER — ADULT MULTIVITAMIN W/MINERALS CH: 1.0000 | ORAL_TABLET | Freq: Every day | ORAL | Status: DC

## 2017-06-26 MED ORDER — CARVEDILOL 6.25 MG PO TABS
6.2500 mg | ORAL_TABLET | Freq: Two times a day (BID) | ORAL | Status: DC
  Administered 2017-06-26: 6.25 mg via ORAL
  Filled 2017-06-26 (×2): qty 1

## 2017-06-26 MED ORDER — INSULIN GLARGINE 300 UNIT/ML ~~LOC~~ SOPN: 15.0000 [IU] | PEN_INJECTOR | SUBCUTANEOUS | Status: DC

## 2017-06-26 MED ORDER — MENS MULTI VITAMIN & MINERAL PO TABS: ORAL_TABLET | Freq: Every day | ORAL | Status: DC

## 2017-06-26 MED ORDER — HEPARIN (PORCINE) IN NACL 2-0.9 UNIT/ML-% IJ SOLN
INTRAMUSCULAR | Status: AC | PRN
  Administered 2017-06-26: 500 mL

## 2017-06-26 SURGICAL SUPPLY — 29 items
BAG SNAP BAND KOVER 36X36 (MISCELLANEOUS) ×2 IMPLANT
CABLE SURGICAL S-101-97-12 (CABLE) ×2 IMPLANT
CATH ATTAIN COM SURV 6250V-AM (CATHETERS) ×2 IMPLANT
CATH ATTAIN COM SURV 6250V-EH (CATHETERS) ×2 IMPLANT
CATH ATTAIN COM SURV 6250V-MB2 (CATHETERS) ×2 IMPLANT
CATH ATTAIN COM SURV 6250V-MP (CATHETERS) ×2 IMPLANT
CATH ATTAIN COM SURV 6250V-MPR (CATHETERS) ×2 IMPLANT
CATH ATTAIN SEL SURV 6248V-90 (CATHETERS) ×2 IMPLANT
CATH HEX JOSEPH 2-5-2 65CM 6F (CATHETERS) ×2 IMPLANT
CATH HEXAPOLAR DAMATO 6F (CATHETERS) ×2 IMPLANT
CATH INFINITI 5FR AL1 (CATHETERS) ×2 IMPLANT
GUIDEWIRE ANGLED .035X150CM (WIRE) ×2 IMPLANT
ICD CLARIA MRI DTMA1QQ (ICD Generator) ×2 IMPLANT
KIT ESSENTIALS PG (KITS) ×2 IMPLANT
KIT MICROINTRODUCER STIFF 5F (SHEATH) ×2 IMPLANT
LEAD ATTAIN PERFORM ST 4398-88 (Lead) ×2 IMPLANT
LEAD CAPSURE NOVUS 5076-52CM (Lead) ×2 IMPLANT
LEAD SPRINT QUAT SEC 6935M-62 (Lead) ×2 IMPLANT
PAD DEFIB LIFELINK (PAD) ×2 IMPLANT
SHEATH CLASSIC 7F (SHEATH) ×2 IMPLANT
SHEATH CLASSIC 7F 25CM (SHEATH) ×2 IMPLANT
SHEATH CLASSIC 9.5F (SHEATH) ×2 IMPLANT
SHEATH CLASSIC 9F (SHEATH) ×2 IMPLANT
SLITTER 6232ADJ (MISCELLANEOUS) ×2 IMPLANT
TRAY PACEMAKER INSERTION (PACKS) ×2 IMPLANT
WIRE ACUITY WHISPER EDS 4648 (WIRE) ×2 IMPLANT
WIRE HITORQ VERSACORE ST 145CM (WIRE) ×2 IMPLANT
WIRE LUGE 182CM (WIRE) ×2 IMPLANT
WIRE MAILMAN 182CM (WIRE) ×2 IMPLANT

## 2017-06-26 NOTE — Telephone Encounter (Signed)
[employer] folder given to [employer]

## 2017-06-26 NOTE — Telephone Encounter (Signed)
Forms placed in Inter[facility] mail to [employer] [place]

## 2017-06-26 NOTE — H&P (Signed)
Philip Ponce is a 46 y.o. male with a history of systolic heart failure due to nonischemic cardiomyopathy. He presents today for ICD implant. He has LBBB and thus qualifies for CRT. On exam, RRR, no murmurs, lungs clear. Risks and benefits discussed. Risks include but not limited to bleeding, infection, tamponade, pneumothorax. He understands the risks and has agreed to the procedure.  Augustina Braddock Curt Bears, MD 06/26/2017 12:12 PM  ICD Criteria  Current LVEF:20-25%. Within 12 months prior to implant: Yes   Heart failure history: Yes, Class II  Cardiomyopathy history: Yes, Non-Ischemic Cardiomyopathy.  Atrial Fibrillation/Atrial Flutter: No.  Ventricular tachycardia history: No.  Cardiac arrest history: No.  History of syndromes with risk of sudden death: No.  Previous ICD: No.  Current ICD indication: Primary  PPM indication: No.   Class I or II Bradycardia indication present: No  Beta Blocker therapy for 3 or more months: Yes, prescribed.   Ace Inhibitor/ARB therapy for 3 or more months: Yes, prescribed.

## 2017-06-27 ENCOUNTER — Ambulatory Visit (INDEPENDENT_AMBULATORY_CARE_PROVIDER_SITE_OTHER): Payer: Self-pay | Admitting: *Deleted

## 2017-06-27 ENCOUNTER — Ambulatory Visit (HOSPITAL_COMMUNITY): Payer: 59

## 2017-06-27 ENCOUNTER — Encounter (HOSPITAL_COMMUNITY): Payer: Self-pay | Admitting: Cardiology

## 2017-06-27 ENCOUNTER — Telehealth: Payer: Self-pay | Admitting: Cardiology

## 2017-06-27 DIAGNOSIS — I5022 Chronic systolic (congestive) heart failure: Secondary | ICD-10-CM

## 2017-06-27 DIAGNOSIS — I425 Other restrictive cardiomyopathy: Secondary | ICD-10-CM

## 2017-06-27 DIAGNOSIS — I428 Other cardiomyopathies: Secondary | ICD-10-CM | POA: Diagnosis not present

## 2017-06-27 DIAGNOSIS — E119 Type 2 diabetes mellitus without complications: Secondary | ICD-10-CM | POA: Diagnosis not present

## 2017-06-27 DIAGNOSIS — I509 Heart failure, unspecified: Secondary | ICD-10-CM | POA: Diagnosis not present

## 2017-06-27 DIAGNOSIS — I447 Left bundle-branch block, unspecified: Secondary | ICD-10-CM | POA: Diagnosis not present

## 2017-06-27 DIAGNOSIS — Z452 Encounter for adjustment and management of vascular access device: Secondary | ICD-10-CM | POA: Diagnosis not present

## 2017-06-27 LAB — GLUCOSE, CAPILLARY: GLUCOSE-CAPILLARY: 216 mg/dL — AB (ref 65–99)

## 2017-06-27 NOTE — Discharge Summary (Signed)
ELECTROPHYSIOLOGY PROCEDURE DISCHARGE SUMMARY    Patient ID: Philip Ponce,  MRN: [id number], DOB/AGE: 1971/01/07 46 y.o.  Admit date: 06/26/2017 Discharge date: 06/27/2017  Primary Care Physician: [name], MD Primary Cardiologist: End Electrophysiologist: [facility]  Primary Discharge Diagnosis:  NICM, congestive heart failure post CRTD implant this admission  Secondary Discharge Diagnosis:  1.  Diabetes  Allergies  Allergen Reactions  . Mobic [Meloxicam] Other (See Comments)    Ulcers in stomach eruption   . Diclofenac Palpitations and Other (See Comments)    Fast heart beat, CP,SOB and weakness on one side.     Procedures This Admission:  1.  Implantation of a MDT CRTD on 06/26/17 by Dr Curt Bears.  See op note for full details. There were no immediate post procedure complications. 2.  CXR on 06/27/17 demonstrated no pneumothorax status post device implantation.   Brief HPI: Philip Ponce is a 46 y.o. male was referred to electrophysiology in the outpatient setting for consideration of ICD implantation.  Past medical history includes NICM, LBBB.  The patient has persistent LV dysfunction despite guideline directed therapy.  Risks, benefits, and alternatives to CRTD implantation were reviewed with the patient who wished to proceed.   Hospital Course:  The patient was admitted and underwent implantation of a MDT CRTD with details as outlined above. He was monitored on telemetry overnight which demonstrated sinus rhythm with V pacing.  Left chest was without hematoma or ecchymosis.  The device was interrogated and found to be functioning normally.  CXR was obtained and demonstrated no pneumothorax status post device implantation.  Wound care, arm mobility, and restrictions were reviewed with the patient.  The patient was examined and considered stable for discharge to home.   The patient's discharge medications include an ARB (Entresto) and beta blocker (Coreg).    Physical Exam: Vitals:   06/26/17 1944 06/26/17 2150 06/27/17 0017 06/27/17 0514  BP: 129/82 115/69 131/74 123/75  Pulse: 85 79 85 80  Resp: 18  18 18   Temp: (!) 97.5 F (36.4 C)  98.2 F (36.8 C) 98.2 F (36.8 C)  TempSrc: Oral  Oral Oral  SpO2: 100%  100% 97%  Weight:    184 lb 12.8 oz (83.8 kg)  Height:        GEN- The patient is well appearing, alert and oriented x 3 today.   HEENT: normocephalic, atraumatic; sclera clear, conjunctiva pink; hearing intact; oropharynx clear; neck supple  Lungs- Clear to ausculation bilaterally, normal work of breathing.  No wheezes, rales, rhonchi Heart- Regular rate and rhythm (paced) GI- soft, non-tender, non-distended, bowel sounds present  Extremities- no clubbing, cyanosis, or edema  MS- no significant deformity or atrophy Skin- warm and dry, no rash or lesion, left chest without hematoma/ecchymosis Psych- euthymic mood, full affect Neuro- strength and sensation are intact   Labs:   Lab Results  Component Value Date   WBC 4.6 06/17/2017   HGB 12.0 (L) 06/17/2017   HCT 38.2 06/17/2017   MCV 92 06/17/2017   PLT 232 06/17/2017   No results for input(s): NA, K, CL, CO2, BUN, CREATININE, CALCIUM, PROT, BILITOT, ALKPHOS, ALT, AST, GLUCOSE in the last 168 hours.  Invalid input(s): LABALBU  Discharge Medications:  Allergies as of 06/27/2017      Reactions   Mobic [meloxicam] Other (See Comments)   Ulcers in stomach eruption    Diclofenac Palpitations, Other (See Comments)   Fast heart beat, CP,SOB and weakness on one side.  Medication List    TAKE these medications   ALEVE 220 MG Caps Generic drug:  Naproxen Sodium Take 2 capsules by mouth 2 (two) times daily as needed.   calcium carbonate 600 MG Tabs tablet Commonly known as:  OS-CAL Take 600 mg by mouth 2 (two) times daily with a meal.   carvedilol 6.25 MG tablet Commonly known as:  COREG Take 1 tablet (6.25 mg total) by mouth 2 (two) times daily.   Fish Oil  1200 MG Caps Take 1 capsule by mouth daily.   glucose blood test strip Commonly known as:  ONE TOUCH ULTRA TEST Check blood sugar once daily and as instructed. Dx E11.9   MENS MULTI VITAMIN & MINERAL PO Take 2 tablets by mouth daily. Men's Vita Fusion   metFORMIN 1000 MG tablet Commonly known as:  GLUCOPHAGE Take 1,000 mg by mouth 2 (two) times daily with a meal.   omeprazole 40 MG capsule Commonly known as:  PRILOSEC Take 40 mg by mouth daily.   sacubitril-valsartan 49-51 MG Commonly known as:  ENTRESTO Take 1 tablet by mouth 2 (two) times daily.   spironolactone 25 MG tablet Commonly known as:  ALDACTONE Take 0.5 tablets (12.5 mg total) by mouth daily.   TOUJEO SOLOSTAR 300 UNIT/ML Sopn Generic drug:  Insulin Glargine Inject 15 Units into the skin every morning.   traMADol 50 MG tablet Commonly known as:  ULTRAM Take by mouth every 6 (six) hours as needed.       Disposition:  Discharge Instructions    Diet - low sodium heart healthy    Complete by:  As directed    Increase activity slowly    Complete by:  As directed      Follow-up Information    [place] [facility] Follow up on 07/08/2017.   Specialty:  Cardiology Why:  at 9:30AM Contact information: [address] [place]       [name], MD Follow up on 10/01/2017.   Specialty:  Cardiology Why:  at [facility] information: [place] [address] [phone]           Duration of Discharge Encounter: Greater than 30 minutes including physician time.  Signed, Chanetta Marshall, NP 06/27/2017 8:01 AM   I have seen and examined this patient with Chanetta Marshall.  Agree with above, note added to reflect my findings.  On exam, RRR, no murmurs, lungs clear. History of nonischemic cardiomyopathy and systolic heart failure. Presented to the hospital for a CRT-D implantation. Chest x-ray and interrogation  today without abnormalities. Plan for discharge today with follow-up in cardiology clinic.    Carys Malina M. Sherod Cisse MD 06/27/2017 8:35 AM

## 2017-06-27 NOTE — Discharge Instructions (Signed)
° ° °  Supplemental Discharge Instructions for  Pacemaker/Defibrillator Patients  Activity No heavy lifting or vigorous activity with your left/right arm for 6 to 8 weeks.  Do not raise your left/right arm above your head for one week.  Gradually raise your affected arm as drawn below.           __       07/01/17                    07/02/17                  07/03/17                  07/04/17  NO DRIVING for  1 week   ; you may begin driving on   11/94/17  .  WOUND CARE - Keep the wound area clean and dry.  Do not get this area wet for one week. No showers for one week; you may shower on 07/04/17    . - The tape/steri-strips on your wound will fall off; do not pull them off.  No bandage is needed on the site.  DO  NOT apply any creams, oils, or ointments to the wound area. - If you notice any drainage or discharge from the wound, any swelling or bruising at the site, or you develop a fever > 101? F after you are discharged home, call the office at once.  Special Instructions - You are still able to use cellular telephones; use the ear opposite the side where you have your pacemaker/defibrillator.  Avoid carrying your cellular phone near your device. - When traveling through airports, show security personnel your identification card to avoid being screened in the metal detectors.  Ask the security personnel to use the hand wand. - Avoid arc welding equipment, MRI testing (magnetic resonance imaging), TENS units (transcutaneous nerve stimulators).  Call the office for questions about other devices. - Avoid electrical appliances that are in poor condition or are not properly grounded. - Microwave ovens are safe to be near or to operate.  Additional information for defibrillator patients should your device go off: - If your device goes off ONCE and you feel fine afterward, notify the device clinic nurses. - If your device goes off ONCE and you do not feel well afterward, call 911. - If your device  goes off TWICE, call 911. - If your device goes off THREE times in one day, call 911.  DO NOT DRIVE YOURSELF OR A FAMILY MEMBER WITH A DEFIBRILLATOR TO THE HOSPITAL--CALL 911.

## 2017-06-27 NOTE — Telephone Encounter (Signed)
Spoke with pt, pt stated that he did not receive a shock, pt stated that his side was "thumping" informed pt that this was not dangerous and that it was due to the LV lead and its placement near the phrenic nerve and when it paces it aggravates the nerve. Pt agreeable to appointment at 3:30pm with device clinic

## 2017-06-27 NOTE — Progress Notes (Signed)
Patient slept during the night. Reported that he feels sore after procedure,Tylenol given and effective.  Dressing intact and clean, with small old drainage that patient came with on 3 [place].  EKG done and placed in patient chart.  Will continue to monitor.  Vonne Mcdanel, RN

## 2017-06-27 NOTE — Telephone Encounter (Signed)
°  1. Has your device fired? Yes   2. Is you device beeping? no 3. Are you experiencing draining or swelling at device site?no 4. Are you calling to see if we received your device transmission? no 5. Have you passed out?  no   Please route to [place]

## 2017-06-28 LAB — CUP PACEART INCLINIC DEVICE CHECK
Date Time Interrogation Session: 20181005142716
Implantable Lead Implant Date: 20181003
Implantable Lead Location: 753858
Implantable Lead Model: 4398
Implantable Pulse Generator Implant Date: 20181003
MDC IDC LEAD IMPLANT DT: 20181003
MDC IDC LEAD IMPLANT DT: 20181003
MDC IDC LEAD LOCATION: 753859
MDC IDC LEAD LOCATION: 753860

## 2017-06-28 MED FILL — Lidocaine HCl Local Inj 2%: INTRAMUSCULAR | Qty: 10 | Status: AC

## 2017-06-28 NOTE — Progress Notes (Signed)
Add on for phrenic nerve stimulation. Patient programed LV1 to LV 2 at 3.5V @ 0.34ms. [employer] express run - LV1 to RV coil threshold 0.5V @ 0.38ms. No stim with a threshold test starting at 5V. Re-programed LV1 to RVcoil. Isometrics complete with no stim recreated. Wound check appointment confirmed and DC number given to patient.

## 2017-07-01 ENCOUNTER — Ambulatory Visit (INDEPENDENT_AMBULATORY_CARE_PROVIDER_SITE_OTHER): Payer: 59 | Admitting: *Deleted

## 2017-07-01 DIAGNOSIS — Z95810 Presence of automatic (implantable) cardiac defibrillator: Secondary | ICD-10-CM

## 2017-07-01 DIAGNOSIS — I5042 Chronic combined systolic (congestive) and diastolic (congestive) heart failure: Secondary | ICD-10-CM

## 2017-07-01 DIAGNOSIS — I428 Other cardiomyopathies: Secondary | ICD-10-CM

## 2017-07-01 LAB — CUP PACEART INCLINIC DEVICE CHECK
Battery Voltage: 3.05 V
Brady Statistic RA Percent Paced: 0.04 %
Brady Statistic RV Percent Paced: 32.98 %
HighPow Impedance: 46 Ohm
Implantable Lead Implant Date: 20181003
Implantable Lead Implant Date: 20181003
Implantable Lead Location: 753859
Implantable Lead Model: 4398
Implantable Pulse Generator Implant Date: 20181003
Lead Channel Impedance Value: 160.941
Lead Channel Impedance Value: 160.941
Lead Channel Impedance Value: 304 Ohm
Lead Channel Impedance Value: 304 Ohm
Lead Channel Impedance Value: 342 Ohm
Lead Channel Impedance Value: 361 Ohm
Lead Channel Impedance Value: 475 Ohm
Lead Channel Impedance Value: 532 Ohm
Lead Channel Impedance Value: 532 Ohm
Lead Channel Impedance Value: 551 Ohm
Lead Channel Pacing Threshold Pulse Width: 0.4 ms
Lead Channel Setting Pacing Amplitude: 3.5 V
Lead Channel Setting Pacing Amplitude: 3.5 V
Lead Channel Setting Pacing Pulse Width: 0.4 ms
Lead Channel Setting Pacing Pulse Width: 0.4 ms
Lead Channel Setting Sensing Sensitivity: 0.3 mV
MDC IDC LEAD IMPLANT DT: 20181003
MDC IDC LEAD LOCATION: 753858
MDC IDC LEAD LOCATION: 753860
MDC IDC MSMT BATTERY REMAINING LONGEVITY: 90 mo
MDC IDC MSMT LEADCHNL LV IMPEDANCE VALUE: 152 Ohm
MDC IDC MSMT LEADCHNL LV IMPEDANCE VALUE: 152 Ohm
MDC IDC MSMT LEADCHNL LV IMPEDANCE VALUE: 160.941
MDC IDC MSMT LEADCHNL LV IMPEDANCE VALUE: 304 Ohm
MDC IDC MSMT LEADCHNL LV IMPEDANCE VALUE: 456 Ohm
MDC IDC MSMT LEADCHNL LV IMPEDANCE VALUE: 513 Ohm
MDC IDC MSMT LEADCHNL LV PACING THRESHOLD AMPLITUDE: 1 V
MDC IDC MSMT LEADCHNL RV IMPEDANCE VALUE: 342 Ohm
MDC IDC MSMT LEADCHNL RV IMPEDANCE VALUE: 456 Ohm
MDC IDC SESS DTM: 20181008162928
MDC IDC SET LEADCHNL LV PACING AMPLITUDE: 3 V
MDC IDC STAT BRADY AP VP PERCENT: 0.04 %
MDC IDC STAT BRADY AP VS PERCENT: 0.01 %
MDC IDC STAT BRADY AS VP PERCENT: 98.81 %
MDC IDC STAT BRADY AS VS PERCENT: 1.14 %

## 2017-07-01 NOTE — Progress Notes (Signed)
Add-on for phrenic nerve stimulation. Patient presented programmed LV1-RV coil. Per [employer] Express--reprogrammed LV vector to LV1-LV3 (no stim at 4V in multiple positions), threshold is 1.0V @ 0.52ms, output maintained at 3.0V @ 0.71ms on autocapture. No other testing performed. Reviewed with JA, who did not recommend CXR at this time. Patient and wife aware of wound check appointment on 07/08/17 at 9:30am.

## 2017-07-03 ENCOUNTER — Telehealth: Payer: Self-pay | Admitting: Internal Medicine

## 2017-07-03 NOTE — Telephone Encounter (Signed)
Received records request [employer] forwarded to [facility] for processing.

## 2017-07-04 DIAGNOSIS — M4722 Other spondylosis with radiculopathy, cervical region: Secondary | ICD-10-CM | POA: Diagnosis not present

## 2017-07-08 ENCOUNTER — Ambulatory Visit (INDEPENDENT_AMBULATORY_CARE_PROVIDER_SITE_OTHER): Payer: 59 | Admitting: *Deleted

## 2017-07-08 ENCOUNTER — Encounter: Payer: Self-pay | Admitting: *Deleted

## 2017-07-08 DIAGNOSIS — I428 Other cardiomyopathies: Secondary | ICD-10-CM

## 2017-07-08 DIAGNOSIS — I5042 Chronic combined systolic (congestive) and diastolic (congestive) heart failure: Secondary | ICD-10-CM | POA: Diagnosis not present

## 2017-07-08 LAB — CUP PACEART INCLINIC DEVICE CHECK
Brady Statistic AP VS Percent: 0.01 %
Brady Statistic AS VP Percent: 98.77 %
Brady Statistic AS VS Percent: 1.18 %
HighPow Impedance: 47 Ohm
Implantable Lead Implant Date: 20181003
Implantable Lead Model: 5076
Implantable Pulse Generator Implant Date: 20181003
Lead Channel Impedance Value: 152.559
Lead Channel Impedance Value: 152.559
Lead Channel Impedance Value: 247 Ohm
Lead Channel Impedance Value: 342 Ohm
Lead Channel Impedance Value: 399 Ohm
Lead Channel Impedance Value: 456 Ohm
Lead Channel Impedance Value: 513 Ohm
Lead Channel Impedance Value: 532 Ohm
Lead Channel Impedance Value: 532 Ohm
Lead Channel Pacing Threshold Amplitude: 1.5 V
Lead Channel Pacing Threshold Pulse Width: 0.4 ms
Lead Channel Sensing Intrinsic Amplitude: 2.25 mV
Lead Channel Sensing Intrinsic Amplitude: 7.375 mV
Lead Channel Sensing Intrinsic Amplitude: 8.875 mV
Lead Channel Setting Pacing Amplitude: 2.25 V
Lead Channel Setting Pacing Pulse Width: 0.4 ms
Lead Channel Setting Pacing Pulse Width: 0.4 ms
MDC IDC LEAD IMPLANT DT: 20181003
MDC IDC LEAD IMPLANT DT: 20181003
MDC IDC LEAD LOCATION: 753858
MDC IDC LEAD LOCATION: 753859
MDC IDC LEAD LOCATION: 753860
MDC IDC MSMT BATTERY REMAINING LONGEVITY: 106 mo
MDC IDC MSMT BATTERY VOLTAGE: 3.09 V
MDC IDC MSMT LEADCHNL LV IMPEDANCE VALUE: 143.419
MDC IDC MSMT LEADCHNL LV IMPEDANCE VALUE: 184.154
MDC IDC MSMT LEADCHNL LV IMPEDANCE VALUE: 184.154
MDC IDC MSMT LEADCHNL LV IMPEDANCE VALUE: 399 Ohm
MDC IDC MSMT LEADCHNL LV IMPEDANCE VALUE: 399 Ohm
MDC IDC MSMT LEADCHNL LV IMPEDANCE VALUE: 532 Ohm
MDC IDC MSMT LEADCHNL LV IMPEDANCE VALUE: 608 Ohm
MDC IDC MSMT LEADCHNL LV IMPEDANCE VALUE: 665 Ohm
MDC IDC MSMT LEADCHNL RA PACING THRESHOLD AMPLITUDE: 0.75 V
MDC IDC MSMT LEADCHNL RA PACING THRESHOLD PULSEWIDTH: 0.4 ms
MDC IDC MSMT LEADCHNL RA SENSING INTR AMPL: 2.25 mV
MDC IDC MSMT LEADCHNL RV IMPEDANCE VALUE: 285 Ohm
MDC IDC MSMT LEADCHNL RV PACING THRESHOLD AMPLITUDE: 1 V
MDC IDC MSMT LEADCHNL RV PACING THRESHOLD PULSEWIDTH: 0.4 ms
MDC IDC SESS DTM: 20181015101412
MDC IDC SET LEADCHNL RA PACING AMPLITUDE: 3.5 V
MDC IDC SET LEADCHNL RV PACING AMPLITUDE: 3.5 V
MDC IDC SET LEADCHNL RV SENSING SENSITIVITY: 0.3 mV
MDC IDC STAT BRADY AP VP PERCENT: 0.03 %
MDC IDC STAT BRADY RA PERCENT PACED: 0.04 %
MDC IDC STAT BRADY RV PERCENT PACED: 28.15 %

## 2017-07-08 NOTE — Progress Notes (Signed)
Wound check appointment. Steri-strips removed. Wound without redness or edema. Incision edges approximated, wound well healed. Normal device function. Thresholds, sensing, and impedances consistent with implant measurements. Device programmed at 3.5V in RA/RV, 2.25V with adaptive on in LV for extra safety margin until 3 month visit. Pt c/o infrequent diaphragmatic stim. 98.7% Biv pacing, all effective. Histogram distribution appropriate for patient and level of activity. No mode switches or ventricular arrhythmias noted. Patient educated about wound care, arm mobility, lifting restrictions, shock plan. Letter written to send to work- I will contact Clarise Cruz (patient's work Marine scientist) in order to arrange a Journalist, newspaper with industry to check for device interference. ROV with WC 10/01/17.

## 2017-07-09 ENCOUNTER — Telehealth: Payer: Self-pay | Admitting: *Deleted

## 2017-07-09 NOTE — Telephone Encounter (Signed)
Contacted Rosilyn Mings to arrange for a walk-through with patient and [employer] representative to check for workplace and ICD interference. I will contact industry to arrange for a walk-through next Tuesday.   Emailed Dannial Monarch ([employer]) with [employer].

## 2017-07-11 ENCOUNTER — Telehealth: Payer: Self-pay

## 2017-07-11 DIAGNOSIS — M5010 Cervical disc disorder with radiculopathy, unspecified cervical region: Secondary | ICD-10-CM | POA: Diagnosis not present

## 2017-07-11 NOTE — Telephone Encounter (Signed)
   [facility] Pre-operative Risk Assessment    Request for surgical clearance:  1. What type of surgery is being performed? Cervical Arthoplasty   2. When is this surgery scheduled? 07/24/2017   3. Are there any medications that need to be held prior to surgery and how long? Is patient on blood thinners and instructions on how to take blood thinners before and after surgery   4. Practice name and name of physician performing surgery? [facility], Dr [name]   5. What is your office phone and fax number? Phone [phone] Fax [phone]   6. Anesthesia type (None, local, MAC, general) ? None specified   Philip Ponce 07/11/2017, 3:59 PM  _________________________________________________________________   (provider comments below)

## 2017-07-11 NOTE — Telephone Encounter (Signed)
Clearance faxed/fwd via EPIC to Dr.Chester Izora Ribas

## 2017-07-11 NOTE — Telephone Encounter (Signed)
    Chart reviewed as part of pre-operative protocol coverage. Patient was contacted 07/11/2017 in reference to pre-operative risk assessment for pending surgery as outlined below.  Philip Ponce was last seen on 06/27/17  by Dr. Jarrett Soho.  Patient is not on any antiplatelet or anticoagulation per note. RCRI risk of 6.6% Called to review DASI however LVM to call between 1:30-5:00pm and ask for Pre-op provider.    [place], PA 07/11/2017, 4:23 PM

## 2017-07-11 NOTE — Telephone Encounter (Signed)
      Chart reviewed as part of pre-operative protocol coverage. Patient called back. DASI is 6.61METs. No cardiac symptoms.   Given past medical history and time since last visit, based on ACC/AHA guidelines, ARIN PERAL would be at acceptable risk for the planned procedure without further cardiovascular testing.   [place], PA 07/11/2017, 4:55 PM

## 2017-07-18 ENCOUNTER — Encounter
Admission: RE | Admit: 2017-07-18 | Discharge: 2017-07-18 | Disposition: A | Discharge status: Home / Self Care | Payer: 59 | Source: Ambulatory Visit | Attending: Neurosurgery | Admitting: Neurosurgery

## 2017-07-18 ENCOUNTER — Telehealth: Payer: Self-pay | Admitting: Internal Medicine

## 2017-07-18 DIAGNOSIS — Z01818 Encounter for other preprocedural examination: Secondary | ICD-10-CM | POA: Diagnosis not present

## 2017-07-18 HISTORY — DX: Gastro-esophageal reflux disease without esophagitis: K21.9

## 2017-07-18 HISTORY — DX: Family history of other specified conditions: Z84.89

## 2017-07-18 LAB — PROTIME-INR
INR: 1.11
Prothrombin Time: 14.2 seconds (ref 11.4–15.2)

## 2017-07-18 LAB — TYPE AND SCREEN
ABO/RH(D): A POS
ANTIBODY SCREEN: NEGATIVE

## 2017-07-18 LAB — URINALYSIS, ROUTINE W REFLEX MICROSCOPIC
Bilirubin Urine: NEGATIVE
GLUCOSE, UA: NEGATIVE mg/dL
Hgb urine dipstick: NEGATIVE
KETONES UR: NEGATIVE mg/dL
LEUKOCYTES UA: NEGATIVE
NITRITE: NEGATIVE
PH: 5 (ref 5.0–8.0)
Protein, ur: NEGATIVE mg/dL
SPECIFIC GRAVITY, URINE: 1.023 (ref 1.005–1.030)

## 2017-07-18 LAB — HEMOGLOBIN A1C
Hgb A1c MFr Bld: 6.9 % — ABNORMAL HIGH (ref 4.8–5.6)
MEAN PLASMA GLUCOSE: 151.33 mg/dL

## 2017-07-18 LAB — APTT: aPTT: 33 seconds (ref 24–36)

## 2017-07-18 LAB — POTASSIUM: Potassium: 3.6 mmol/L (ref 3.5–5.1)

## 2017-07-18 NOTE — Pre-Procedure Instructions (Signed)
Peri-operative Prescription for Implanted Cardiac Device Programming was faxed to Dr. Loletha Grayer. End at [place] to be completed and returned for pt's upcoming surgery.  Pt had a PCR done 3 weeks ago, there fore it was not repeated today.

## 2017-07-18 NOTE — Patient Instructions (Signed)
Your procedure is scheduled on: Wednesday Oct. 31, 2018. Report to Same Day Surgery To find out your arrival time please call [phone] between 1PM - 3PM on Tuesday Oct. 30, 2018.Marland Kitchen  Remember: Instructions that are not followed completely may result in serious medical risk, up to and including death, or upon the discretion of your surgeon and anesthesiologist your surgery may need to be rescheduled.     _X__ 1. Do not eat food after midnight the night before your procedure.                 No gum chewing or hard candies. You may drink clear liquids up to 2 hours                 before you are scheduled to arrive for your surgery- DO not drink clear                 liquids within 2 hours of the start of your surgery.                 Clear Liquids include:  water.     ___ 2.  No Alcohol for 24 hours before or after surgery.   ___ 3.  Do Not Smoke or use e-cigarettes For 24 Hours Prior to Your Surgery.                 Do not use any chewable tobacco products for at least 6 hours prior to                 surgery.  ____  4.  Bring all medications with you on the day of surgery if instructed.   __x__  5.  Notify your doctor if there is any change in your medical condition      (cold, fever, infections).     Do not wear jewelry, make-up, hairpins, clips or nail polish. Do not wear lotions, powders, or perfumes. You may wear deodorant. Do not shave 48 hours prior to surgery. Men may shave face and neck. Do not bring valuables to the hospital.    [facility] is not responsible for any belongings or valuables.  Contacts, dentures or bridgework may not be worn into surgery. Leave your suitcase in the car. After surgery it may be brought to your room. For patients admitted to the hospital, discharge time is determined by your treatment team.   Patients discharged the day of surgery will not be allowed to drive home.   Please read over the following fact sheets that  you were given:   Preparing for surgery   __x__ Take these medicines the morning of surgery with A SIP OF WATER:    1. carvedilol (COREG)   2. omeprazole (PRILOSEC) take at bedtime the night prior to surgery and again the morning of surgery.    ____ Fleet Enema (as directed)   _x___ Use CHG Soap as directed  ____ Use inhalers on the day of surgery  _x___ Stop metformin 2 days prior to surgery    _x___  No insulin the morning of surgery.   ____ Stop Coumadin/Plavix/aspirin on does not apply.  _x_ Stop Anti-inflammatories on Advil, Aleve, Motrin, Ibuprofen, Naproxen, Naprosyn, Goodies Powders or Aspirin products.   _x__ Stop supplements: Omega-3 Fatty Acids (FISH OIL) until after surgery.    ____ Bring C-Pap to the hospital.

## 2017-07-18 NOTE — Telephone Encounter (Signed)
Received fax from Preadmission testing at [facility] with request to complete form for "Perioperative Prescription for Implaned Cardiac Device Programming." Form faxed to device clinic in [place] and will route this encounter as well.

## 2017-07-19 NOTE — Telephone Encounter (Signed)
Perioperative form received. Completed and faxed to [facility].

## 2017-07-24 ENCOUNTER — Observation Stay
Admission: RE | Admit: 2017-07-24 | Discharge: 2017-07-25 | Disposition: A | Discharge status: Home / Self Care | Payer: 59 | Source: Ambulatory Visit | Attending: Neurosurgery | Admitting: Neurosurgery

## 2017-07-24 ENCOUNTER — Observation Stay: Payer: 59

## 2017-07-24 ENCOUNTER — Encounter
Admission: RE | Disposition: A | Discharge status: Home / Self Care | Payer: Self-pay | Source: Ambulatory Visit | Attending: Neurosurgery

## 2017-07-24 ENCOUNTER — Ambulatory Visit: Payer: 59 | Admitting: Certified Registered Nurse Anesthetist

## 2017-07-24 ENCOUNTER — Ambulatory Visit: Payer: 59

## 2017-07-24 ENCOUNTER — Encounter: Payer: Self-pay | Admitting: *Deleted

## 2017-07-24 DIAGNOSIS — I110 Hypertensive heart disease with heart failure: Secondary | ICD-10-CM | POA: Insufficient documentation

## 2017-07-24 DIAGNOSIS — I5042 Chronic combined systolic (congestive) and diastolic (congestive) heart failure: Secondary | ICD-10-CM | POA: Insufficient documentation

## 2017-07-24 DIAGNOSIS — Z794 Long term (current) use of insulin: Secondary | ICD-10-CM | POA: Insufficient documentation

## 2017-07-24 DIAGNOSIS — E119 Type 2 diabetes mellitus without complications: Secondary | ICD-10-CM | POA: Diagnosis not present

## 2017-07-24 DIAGNOSIS — I429 Cardiomyopathy, unspecified: Secondary | ICD-10-CM | POA: Diagnosis not present

## 2017-07-24 DIAGNOSIS — I252 Old myocardial infarction: Secondary | ICD-10-CM | POA: Insufficient documentation

## 2017-07-24 DIAGNOSIS — Z95810 Presence of automatic (implantable) cardiac defibrillator: Secondary | ICD-10-CM | POA: Insufficient documentation

## 2017-07-24 DIAGNOSIS — G4730 Sleep apnea, unspecified: Secondary | ICD-10-CM | POA: Diagnosis not present

## 2017-07-24 DIAGNOSIS — K219 Gastro-esophageal reflux disease without esophagitis: Secondary | ICD-10-CM | POA: Insufficient documentation

## 2017-07-24 DIAGNOSIS — Z419 Encounter for procedure for purposes other than remedying health state, unspecified: Secondary | ICD-10-CM

## 2017-07-24 DIAGNOSIS — Z79899 Other long term (current) drug therapy: Secondary | ICD-10-CM | POA: Insufficient documentation

## 2017-07-24 DIAGNOSIS — M50122 Cervical disc disorder at C5-C6 level with radiculopathy: Secondary | ICD-10-CM | POA: Diagnosis not present

## 2017-07-24 DIAGNOSIS — M5412 Radiculopathy, cervical region: Secondary | ICD-10-CM | POA: Diagnosis not present

## 2017-07-24 DIAGNOSIS — Z981 Arthrodesis status: Secondary | ICD-10-CM

## 2017-07-24 DIAGNOSIS — I340 Nonrheumatic mitral (valve) insufficiency: Secondary | ICD-10-CM | POA: Insufficient documentation

## 2017-07-24 DIAGNOSIS — Z888 Allergy status to other drugs, medicaments and biological substances status: Secondary | ICD-10-CM | POA: Diagnosis not present

## 2017-07-24 DIAGNOSIS — M4322 Fusion of spine, cervical region: Secondary | ICD-10-CM | POA: Diagnosis not present

## 2017-07-24 DIAGNOSIS — M5410 Radiculopathy, site unspecified: Secondary | ICD-10-CM | POA: Diagnosis not present

## 2017-07-24 DIAGNOSIS — I509 Heart failure, unspecified: Secondary | ICD-10-CM | POA: Diagnosis not present

## 2017-07-24 DIAGNOSIS — M5010 Cervical disc disorder with radiculopathy, unspecified cervical region: Secondary | ICD-10-CM | POA: Diagnosis not present

## 2017-07-24 HISTORY — PX: CERVICAL DISC ARTHROPLASTY: SHX587

## 2017-07-24 LAB — GLUCOSE, CAPILLARY
GLUCOSE-CAPILLARY: 200 mg/dL — AB (ref 65–99)
GLUCOSE-CAPILLARY: 225 mg/dL — AB (ref 65–99)
GLUCOSE-CAPILLARY: 237 mg/dL — AB (ref 65–99)
GLUCOSE-CAPILLARY: 318 mg/dL — AB (ref 65–99)

## 2017-07-24 LAB — ABO/RH: ABO/RH(D): A POS

## 2017-07-24 SURGERY — CERVICAL ANTERIOR DISC ARTHROPLASTY
Anesthesia: General

## 2017-07-24 MED ORDER — HYDROMORPHONE HCL 1 MG/ML IJ SOLN: 0.5000 mg | INTRAMUSCULAR | Status: DC | PRN

## 2017-07-24 MED ORDER — INSULIN ASPART 100 UNIT/ML ~~LOC~~ SOLN
0.0000 [IU] | Freq: Three times a day (TID) | SUBCUTANEOUS | Status: DC
  Administered 2017-07-24: 3 [IU] via SUBCUTANEOUS
  Administered 2017-07-25: 1 [IU] via SUBCUTANEOUS
  Filled 2017-07-24 (×2): qty 1

## 2017-07-24 MED ORDER — ACETAMINOPHEN 500 MG PO TABS
1000.0000 mg | ORAL_TABLET | Freq: Four times a day (QID) | ORAL | Status: DC
  Administered 2017-07-24 – 2017-07-25 (×3): 1000 mg via ORAL
  Filled 2017-07-24 (×3): qty 2

## 2017-07-24 MED ORDER — MENTHOL 3 MG MT LOZG
1.0000 | LOZENGE | OROMUCOSAL | Status: DC | PRN
  Filled 2017-07-24: qty 9

## 2017-07-24 MED ORDER — ROCURONIUM BROMIDE 100 MG/10ML IV SOLN
INTRAVENOUS | Status: DC | PRN
  Administered 2017-07-24 (×2): 20 mg via INTRAVENOUS
  Administered 2017-07-24: 50 mg via INTRAVENOUS
  Administered 2017-07-24: 10 mg via INTRAVENOUS

## 2017-07-24 MED ORDER — FENTANYL CITRATE (PF) 100 MCG/2ML IJ SOLN
INTRAMUSCULAR | Status: AC
  Administered 2017-07-24: 25 ug via INTRAVENOUS
  Filled 2017-07-24: qty 2

## 2017-07-24 MED ORDER — FENTANYL CITRATE (PF) 100 MCG/2ML IJ SOLN
25.0000 ug | INTRAMUSCULAR | Status: DC | PRN
  Administered 2017-07-24 (×2): 25 ug via INTRAVENOUS

## 2017-07-24 MED ORDER — FENTANYL CITRATE (PF) 100 MCG/2ML IJ SOLN
INTRAMUSCULAR | Status: DC | PRN
  Administered 2017-07-24: 50 ug via INTRAVENOUS
  Administered 2017-07-24 (×2): 100 ug via INTRAVENOUS
  Administered 2017-07-24 (×2): 50 ug via INTRAVENOUS

## 2017-07-24 MED ORDER — INSULIN ASPART 100 UNIT/ML ~~LOC~~ SOLN
0.0000 [IU] | Freq: Every day | SUBCUTANEOUS | Status: DC
  Administered 2017-07-24: 4 [IU] via SUBCUTANEOUS
  Filled 2017-07-24: qty 1

## 2017-07-24 MED ORDER — OXYCODONE HCL 5 MG PO TABS
5.0000 mg | ORAL_TABLET | ORAL | Status: DC | PRN
  Administered 2017-07-24: 5 mg via ORAL
  Filled 2017-07-24: qty 1

## 2017-07-24 MED ORDER — FENTANYL CITRATE (PF) 100 MCG/2ML IJ SOLN
INTRAMUSCULAR | Status: AC
  Filled 2017-07-24: qty 2

## 2017-07-24 MED ORDER — MIDAZOLAM HCL 2 MG/2ML IJ SOLN
INTRAMUSCULAR | Status: AC
  Filled 2017-07-24: qty 2

## 2017-07-24 MED ORDER — ONDANSETRON HCL 4 MG/2ML IJ SOLN: 4.0000 mg | Freq: Once | INTRAMUSCULAR | Status: DC | PRN

## 2017-07-24 MED ORDER — SENNOSIDES-DOCUSATE SODIUM 8.6-50 MG PO TABS: 1.0000 | ORAL_TABLET | Freq: Every evening | ORAL | Status: DC | PRN

## 2017-07-24 MED ORDER — SODIUM CHLORIDE 0.9% FLUSH
3.0000 mL | Freq: Two times a day (BID) | INTRAVENOUS | Status: DC
  Administered 2017-07-24: 3 mL via INTRAVENOUS

## 2017-07-24 MED ORDER — METFORMIN HCL 500 MG PO TABS
1000.0000 mg | ORAL_TABLET | Freq: Two times a day (BID) | ORAL | Status: DC
  Administered 2017-07-24 – 2017-07-25 (×2): 1000 mg via ORAL
  Filled 2017-07-24 (×2): qty 2

## 2017-07-24 MED ORDER — GELATIN ABSORBABLE 12-7 MM EX MISC
CUTANEOUS | Status: AC
  Filled 2017-07-24: qty 1

## 2017-07-24 MED ORDER — SODIUM CHLORIDE FLUSH 0.9 % IV SOLN
INTRAVENOUS | Status: AC
  Filled 2017-07-24: qty 10

## 2017-07-24 MED ORDER — GELATIN ABSORBABLE 12-7 MM EX MISC
CUTANEOUS | Status: DC | PRN
  Administered 2017-07-24: 1

## 2017-07-24 MED ORDER — CARVEDILOL 12.5 MG PO TABS
6.2500 mg | ORAL_TABLET | Freq: Two times a day (BID) | ORAL | Status: DC
  Administered 2017-07-24 – 2017-07-25 (×2): 6.25 mg via ORAL
  Filled 2017-07-24 (×2): qty 1

## 2017-07-24 MED ORDER — FENTANYL CITRATE (PF) 250 MCG/5ML IJ SOLN
INTRAMUSCULAR | Status: AC
  Filled 2017-07-24: qty 5

## 2017-07-24 MED ORDER — PHENOL 1.4 % MT LIQD
1.0000 | OROMUCOSAL | Status: DC | PRN
  Filled 2017-07-24: qty 177

## 2017-07-24 MED ORDER — MIDAZOLAM HCL 2 MG/2ML IJ SOLN
INTRAMUSCULAR | Status: DC | PRN
  Administered 2017-07-24: 2 mg via INTRAVENOUS

## 2017-07-24 MED ORDER — ONDANSETRON HCL 4 MG/2ML IJ SOLN
INTRAMUSCULAR | Status: AC
  Filled 2017-07-24: qty 2

## 2017-07-24 MED ORDER — THROMBIN (RECOMBINANT) 5000 UNITS EX SOLR
CUTANEOUS | Status: DC | PRN
  Administered 2017-07-24: 5000 [IU] via TOPICAL

## 2017-07-24 MED ORDER — SODIUM CHLORIDE 0.9 % IR SOLN
Status: DC | PRN
  Administered 2017-07-24: 50000 mL

## 2017-07-24 MED ORDER — SODIUM CHLORIDE 0.9 % IV SOLN
INTRAVENOUS | Status: DC | PRN
  Administered 2017-07-24: 25 ug/min via INTRAVENOUS

## 2017-07-24 MED ORDER — PANTOPRAZOLE SODIUM 40 MG PO TBEC
40.0000 mg | DELAYED_RELEASE_TABLET | Freq: Every day | ORAL | Status: DC
  Administered 2017-07-25: 40 mg via ORAL
  Filled 2017-07-24: qty 1

## 2017-07-24 MED ORDER — SODIUM CHLORIDE 0.9 % IV SOLN
INTRAVENOUS | Status: DC
  Administered 2017-07-24: 10:00:00 via INTRAVENOUS

## 2017-07-24 MED ORDER — SODIUM CHLORIDE 0.9 % IV SOLN: 250.0000 mL | INTRAVENOUS | Status: DC

## 2017-07-24 MED ORDER — METHOCARBAMOL 500 MG PO TABS
500.0000 mg | ORAL_TABLET | Freq: Four times a day (QID) | ORAL | Status: DC | PRN
  Administered 2017-07-24: 500 mg via ORAL
  Filled 2017-07-24: qty 1

## 2017-07-24 MED ORDER — ONDANSETRON HCL 4 MG PO TABS: 4.0000 mg | ORAL_TABLET | Freq: Four times a day (QID) | ORAL | Status: DC | PRN

## 2017-07-24 MED ORDER — THROMBIN (RECOMBINANT) 5000 UNITS EX SOLR
CUTANEOUS | Status: AC
  Filled 2017-07-24: qty 10000

## 2017-07-24 MED ORDER — ONDANSETRON HCL 4 MG/2ML IJ SOLN
INTRAMUSCULAR | Status: DC | PRN
  Administered 2017-07-24: 4 mg via INTRAVENOUS

## 2017-07-24 MED ORDER — SODIUM CHLORIDE 0.9 % IV SOLN
INTRAVENOUS | Status: DC
  Administered 2017-07-24 (×2): via INTRAVENOUS

## 2017-07-24 MED ORDER — ACETAMINOPHEN 10 MG/ML IV SOLN
INTRAVENOUS | Status: DC | PRN
  Administered 2017-07-24: 1000 mg via INTRAVENOUS

## 2017-07-24 MED ORDER — INSULIN GLARGINE 100 UNIT/ML ~~LOC~~ SOLN
10.0000 [IU] | Freq: Every day | SUBCUTANEOUS | Status: DC
  Administered 2017-07-25: 10 [IU] via SUBCUTANEOUS
  Filled 2017-07-24 (×3): qty 0.1

## 2017-07-24 MED ORDER — BACITRACIN 50000 UNITS IM SOLR
INTRAMUSCULAR | Status: AC
  Filled 2017-07-24: qty 1

## 2017-07-24 MED ORDER — ACETAMINOPHEN 325 MG PO TABS: 650.0000 mg | ORAL_TABLET | ORAL | Status: DC | PRN

## 2017-07-24 MED ORDER — IBUPROFEN 600 MG PO TABS
600.0000 mg | ORAL_TABLET | Freq: Three times a day (TID) | ORAL | Status: DC
  Administered 2017-07-24 – 2017-07-25 (×3): 600 mg via ORAL
  Filled 2017-07-24 (×4): qty 1

## 2017-07-24 MED ORDER — SODIUM CHLORIDE 0.9% FLUSH: 3.0000 mL | INTRAVENOUS | Status: DC | PRN

## 2017-07-24 MED ORDER — SACUBITRIL-VALSARTAN 49-51 MG PO TABS
1.0000 | ORAL_TABLET | Freq: Two times a day (BID) | ORAL | Status: DC
  Administered 2017-07-24 – 2017-07-25 (×2): 1 via ORAL
  Filled 2017-07-24 (×2): qty 1

## 2017-07-24 MED ORDER — DEXAMETHASONE SODIUM PHOSPHATE 10 MG/ML IJ SOLN
INTRAMUSCULAR | Status: AC
Start: 2017-07-24 — End: 2017-07-24
  Filled 2017-07-24: qty 1

## 2017-07-24 MED ORDER — LIDOCAINE HCL (CARDIAC) 20 MG/ML IV SOLN
INTRAVENOUS | Status: DC | PRN
  Administered 2017-07-24: 100 mg via INTRAVENOUS

## 2017-07-24 MED ORDER — CEFAZOLIN SODIUM-DEXTROSE 2-4 GM/100ML-% IV SOLN
2.0000 g | Freq: Once | INTRAVENOUS | Status: AC
  Administered 2017-07-24: 2 g via INTRAVENOUS

## 2017-07-24 MED ORDER — SPIRONOLACTONE 25 MG PO TABS
12.5000 mg | ORAL_TABLET | Freq: Every day | ORAL | Status: DC
  Administered 2017-07-25: 12.5 mg via ORAL
  Filled 2017-07-24: qty 1

## 2017-07-24 MED ORDER — FUROSEMIDE 20 MG PO TABS: 20.0000 mg | ORAL_TABLET | Freq: Every day | ORAL | Status: DC | PRN

## 2017-07-24 MED ORDER — OXYCODONE HCL 5 MG PO TABS: 10.0000 mg | ORAL_TABLET | ORAL | Status: DC | PRN

## 2017-07-24 MED ORDER — DEXAMETHASONE SODIUM PHOSPHATE 10 MG/ML IJ SOLN
INTRAMUSCULAR | Status: DC | PRN
  Administered 2017-07-24: 10 mg via INTRAVENOUS

## 2017-07-24 MED ORDER — BUPIVACAINE-EPINEPHRINE (PF) 0.5% -1:200000 IJ SOLN
INTRAMUSCULAR | Status: DC | PRN
  Administered 2017-07-24: 5 mL

## 2017-07-24 MED ORDER — ONDANSETRON HCL 4 MG/2ML IJ SOLN: 4.0000 mg | Freq: Four times a day (QID) | INTRAMUSCULAR | Status: DC | PRN

## 2017-07-24 MED ORDER — ESMOLOL HCL 100 MG/10ML IV SOLN
INTRAVENOUS | Status: DC | PRN
  Administered 2017-07-24: 20 mg via INTRAVENOUS

## 2017-07-24 MED ORDER — ETOMIDATE 2 MG/ML IV SOLN
INTRAVENOUS | Status: DC | PRN
  Administered 2017-07-24: 20 mg via INTRAVENOUS

## 2017-07-24 MED ORDER — ACETAMINOPHEN 650 MG RE SUPP: 650.0000 mg | RECTAL | Status: DC | PRN

## 2017-07-24 MED ORDER — BUPIVACAINE-EPINEPHRINE (PF) 0.5% -1:200000 IJ SOLN
INTRAMUSCULAR | Status: AC
  Filled 2017-07-24: qty 30

## 2017-07-24 MED ORDER — METHOCARBAMOL 1000 MG/10ML IJ SOLN
500.0000 mg | Freq: Four times a day (QID) | INTRAVENOUS | Status: DC | PRN
  Filled 2017-07-24: qty 5

## 2017-07-24 MED ORDER — CEFAZOLIN SODIUM-DEXTROSE 2-4 GM/100ML-% IV SOLN
INTRAVENOUS | Status: AC
  Filled 2017-07-24: qty 100

## 2017-07-24 MED ORDER — ACETAMINOPHEN 10 MG/ML IV SOLN
INTRAVENOUS | Status: AC
  Filled 2017-07-24: qty 100

## 2017-07-24 MED ORDER — ROCURONIUM BROMIDE 50 MG/5ML IV SOLN
INTRAVENOUS | Status: AC
  Filled 2017-07-24: qty 2

## 2017-07-24 SURGICAL SUPPLY — 57 items
BLADE BOVIE TIP EXT 4 (BLADE) ×2 IMPLANT
BLADE SURG 15 STRL LF DISP TIS (BLADE) ×1 IMPLANT
BLADE SURG 15 STRL SS (BLADE) ×1
BUR NEURO DRILL SOFT 3.0X3.8M (BURR) ×2 IMPLANT
CANISTER SUCT 1200ML W/VALVE (MISCELLANEOUS) ×4 IMPLANT
CHLORAPREP W/TINT 26ML (MISCELLANEOUS) ×2 IMPLANT
COUNTER NEEDLE 20/40 LG (NEEDLE) ×2 IMPLANT
COVER LIGHT HANDLE STERIS (MISCELLANEOUS) ×4 IMPLANT
CRADLE LAMINECT ARM (MISCELLANEOUS) ×2 IMPLANT
CUP MEDICINE 2OZ PLAST GRAD ST (MISCELLANEOUS) ×4 IMPLANT
DERMABOND ADVANCED (GAUZE/BANDAGES/DRESSINGS) ×1
DERMABOND ADVANCED .7 DNX12 (GAUZE/BANDAGES/DRESSINGS) ×1 IMPLANT
DISC MOBI-C CERVICAL 15X17 H6 (Miscellaneous) ×4 IMPLANT
DRAPE C-ARM 42X72 X-RAY (DRAPES) ×4 IMPLANT
DRAPE INCISE IOBAN 66X45 STRL (DRAPES) ×2 IMPLANT
DRAPE LAPAROTOMY 77X122 PED (DRAPES) ×2 IMPLANT
DRAPE MICROSCOPE SPINE 48X150 (DRAPES) ×2 IMPLANT
DRAPE POUCH INSTRU U-SHP 10X18 (DRAPES) ×2 IMPLANT
DRAPE SHEET LG 3/4 BI-LAMINATE (DRAPES) ×2 IMPLANT
DRAPE SURG 17X11 SM STRL (DRAPES) ×8 IMPLANT
DRAPE TABLE BACK 80X90 (DRAPES) ×2 IMPLANT
ELECT CAUTERY BLADE TIP 2.5 (TIP) ×2
ELECT REM PT RETURN 9FT ADLT (ELECTROSURGICAL) ×2
ELECTRODE CAUTERY BLDE TIP 2.5 (TIP) ×1 IMPLANT
ELECTRODE REM PT RTRN 9FT ADLT (ELECTROSURGICAL) ×1 IMPLANT
FEE INTRAOP MONITOR IMPULS NCS (MISCELLANEOUS) IMPLANT
FRAME EYE SHIELD (PROTECTIVE WEAR) ×2 IMPLANT
GLOVE SURG SYN 8.5  E (GLOVE) ×3
GLOVE SURG SYN 8.5 E (GLOVE) ×3 IMPLANT
GOWN SRG XL LVL 3 NONREINFORCE (GOWNS) ×1 IMPLANT
GOWN STRL NON-REIN TWL XL LVL3 (GOWNS) ×1
GOWN STRL REUS W/ TWL LRG LVL3 (GOWN DISPOSABLE) ×2 IMPLANT
GOWN STRL REUS W/TWL LRG LVL3 (GOWN DISPOSABLE) ×2
GRADUATE 1200CC STRL 31836 (MISCELLANEOUS) ×2 IMPLANT
INTRAOP MONITOR FEE IMPULS NCS (MISCELLANEOUS)
INTRAOP MONITOR FEE IMPULSE (MISCELLANEOUS)
IV CATH ANGIO 12GX3 LT BLUE (NEEDLE) ×2 IMPLANT
KIT RM TURNOVER STRD PROC AR (KITS) ×2 IMPLANT
MARKER SKIN DUAL TIP RULER LAB (MISCELLANEOUS) ×2 IMPLANT
NEEDLE HYPO 22GX1.5 SAFETY (NEEDLE) ×2 IMPLANT
NS IRRIG 1000ML POUR BTL (IV SOLUTION) ×2 IMPLANT
PACK LAMINECTOMY NEURO (CUSTOM PROCEDURE TRAY) ×2 IMPLANT
PIN CASPAR 14 (PIN) IMPLANT
PIN CASPAR 14MM (PIN)
PIN CASPAR SPINAL 14MM CERVICA (PIN) ×2 IMPLANT
SPOGE SURGIFLO 8M (HEMOSTASIS) ×2
SPONGE KITTNER 5P (MISCELLANEOUS) ×2 IMPLANT
SPONGE SURGIFLO 8M (HEMOSTASIS) ×2 IMPLANT
STAPLER SKIN PROX 35W (STAPLE) ×2 IMPLANT
STRIP CLOSURE SKIN 1/2X4 (GAUZE/BANDAGES/DRESSINGS) IMPLANT
SUT V-LOC 90 ABS DVC 3-0 CL (SUTURE) ×2 IMPLANT
SUT VIC AB 3-0 SH 8-18 (SUTURE) ×2 IMPLANT
SYR 30ML LL (SYRINGE) ×2 IMPLANT
TAPE ADH 3 LX (MISCELLANEOUS) ×2 IMPLANT
TOWEL OR 17X26 4PK STRL BLUE (TOWEL DISPOSABLE) ×8 IMPLANT
TRAY FOLEY W/METER SILVER 16FR (SET/KITS/TRAYS/PACK) IMPLANT
TUBING CONNECTING 10 (TUBING) ×2 IMPLANT

## 2017-07-24 NOTE — Consult Note (Addendum)
[place] [facility] [place] NAME: Philip Ponce    MR#:  [id number]  DATE OF BIRTH:  Aug 04, 1971  DATE OF ADMISSION:  07/24/2017  PRIMARY CARE PHYSICIAN: [name], MD   REQUESTING/REFERRING PHYSICIAN: [name], MD  CHIEF COMPLAINT:  No chief complaint on file.  Reason for consult: CHF. HISTORY OF PRESENT ILLNESS:  Philip Ponce  is a 46 y.o. male with a known history of chronic combined systolic and diastolic CHF, non-STEMI, s/p AICD, GERD and diabetes. The patient is admitted for cervical radiculopathy and is status post surgery today. Neurosurgeon request a medical consult for CHF. The patient denies any chest pain, shortness of breath or cough or wheezing. PAST MEDICAL HISTORY:   Past Medical History:  Diagnosis Date  . Acute blood loss anemia 11/20/2016  . 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 Chronic combined systolic (congestive) and diastolic (congestive) heart failure ([place])    a. 11/2016 Ehco: EF 20-25%, glob HK, antsept, ant, apical HK, Gr1 DD, mild MR, mildly dil LA, nl RV fxn; b. 05/2017 Echo: Ef 20-25%, Gr2 DD, prominent apical trabeculations.  . Diabetes mellitus ([place])   . Family history of adverse reaction to anesthesia    mom had a hard time waking up  . GERD (gastroesophageal reflux disease)   . NICM (nonischemic cardiomyopathy) ([place])    a. 11/2016 Cath: nl cors, EF 25-30%; b. 11/2016 Echo: EF 20-25%; c. 05/2017 Echo: EF 20-25%.  . Slipped intervertebral disc    L4 L5  . STEMI (ST elevation myocardial infarction) ([place])    a. 11/2016 ST elevation -->Nl cors on cath.    PAST SURGICAL HISTORY:   Past Surgical History:  Procedure Laterality Date  . BIV ICD INSERTION CRT-D  06/26/2017  . BIV ICD INSERTION CRT-D N/A 06/26/2017   Procedure: BIV ICD INSERTION CRT-D;  Surgeon: [name], MD;  Location: [place] CV LAB;  Service: Cardiovascular;  Laterality: N/A;  .  CERVICAL DISC ARTHROPLASTY N/A 07/24/2017   Procedure: CERVICAL ANTERIOR Nelson ARTHROPLASTY C5-C7;  Surgeon: [name], MD;  Location: [facility] ORS;  Service: Neurosurgery;  Laterality: N/A;  . COLONOSCOPY WITH PROPOFOL N/A 12/03/2016   Procedure: COLONOSCOPY WITH PROPOFOL;  Surgeon: [name], MD;  Location: [facility] ENDOSCOPY;  Service: Endoscopy;  Laterality: N/A;  . ESOPHAGOGASTRODUODENOSCOPY (EGD) WITH PROPOFOL N/A 12/03/2016   Procedure: ESOPHAGOGASTRODUODENOSCOPY (EGD) WITH PROPOFOL;  Surgeon: [name], MD;  Location: [facility] ENDOSCOPY;  Service: Endoscopy;  Laterality: N/A;  . FINGER SURGERY Right 2012   index finger  . GASTRIC BYPASS    . GIVENS CAPSULE STUDY N/A 01/07/2017   Procedure: GIVENS CAPSULE STUDY;  Surgeon: [name], MD;  Location: [facility];  Service: Endoscopy;  Laterality: N/A;  . LEFT HEART CATH AND CORONARY ANGIOGRAPHY N/A 12/04/2016   Procedure: Left Heart Cath and Coronary Angiography;  Surgeon: [name], MD;  Location: [place] CV LAB;  Service: Cardiovascular;  Laterality: N/A;    SOCIAL HISTORY:   Social History  Substance Use Topics  . Smoking status: Never Smoker  . Smokeless tobacco: Never Used  . Alcohol use No    FAMILY HISTORY:   Family History  Problem Relation Age of Onset  . Diabetes Mother   . Hypertension Mother   . Cancer Mother        breast  . Diabetes Father   . Hypertension Father   . Cancer Father        colon  .  Cancer Maternal Grandfather        prostate  . Stroke Maternal Grandfather        CVA    DRUG ALLERGIES:   Allergies  Allergen Reactions  . Mobic [Meloxicam] Other (See Comments)    Ulcers in stomach eruption   . Diclofenac Palpitations and Other (See Comments)    Fast heart beat, CP,SOB and weakness on one side.    REVIEW OF SYSTEMS:   Review of Systems  Constitutional: Negative for chills, fever and malaise/fatigue.  HENT: Negative for sore throat.   Eyes: Negative for blurred vision and  double vision.  Respiratory: Negative for cough, hemoptysis, shortness of breath, wheezing and stridor.   Cardiovascular: Negative for chest pain, palpitations, orthopnea and leg swelling.  Gastrointestinal: Negative for abdominal pain, blood in stool, diarrhea, melena, nausea and vomiting.  Genitourinary: Negative for dysuria, flank pain and hematuria.  Musculoskeletal: Positive for neck pain. Negative for back pain and joint pain.  Skin: Negative for rash.  Neurological: Negative for dizziness, sensory change, focal weakness, seizures, loss of consciousness, weakness and headaches.  Endo/Heme/Allergies: Negative for polydipsia.  Psychiatric/Behavioral: Negative for depression. The patient is not nervous/anxious.     MEDICATIONS AT HOME:   Prior to Admission medications   Medication Sig Start Date End Date Taking? Authorizing Provider  calcium carbonate (OS-CAL) 600 MG TABS tablet Take 600 mg by mouth 2 (two) times daily with a meal.   Yes [provider]  carvedilol (COREG) 6.25 MG tablet Take 1 tablet (6.25 mg total) by mouth 2 (two) times daily. 02/21/17  Yes End, Harrell Gave, MD  furosemide (LASIX) 20 MG tablet Take 20 mg by mouth daily as needed for fluid.   Yes [provider]  Insulin Glargine (TOUJEO SOLOSTAR) 300 UNIT/ML SOPN Inject 15 Units into the skin every morning.    Yes [provider]  metFORMIN (GLUCOPHAGE) 500 MG tablet Take 1,000 mg by mouth 2 (two) times daily with a meal.   Yes [provider]  Multiple Vitamins-Minerals (MENS MULTI VITAMIN & MINERAL PO) Take 2 tablets by mouth daily. Men's Vita Fusion   Yes [provider]  Naproxen Sodium (ALEVE) 220 MG CAPS Take 2 capsules by mouth 2 (two) times daily as needed.    Yes [provider]  Omega-3 Fatty Acids (FISH OIL) 1200 MG CAPS Take 1 capsule by mouth daily.   Yes [provider]  omeprazole (PRILOSEC) 40 MG capsule Take 40 mg by mouth daily as needed.     Yes [provider]  sacubitril-valsartan (ENTRESTO) 49-51 MG Take 1 tablet by mouth 2 (two) times daily. 02/15/17  Yes Darylene Price A, FNP  spironolactone (ALDACTONE) 25 MG tablet Take 0.5 tablets (12.5 mg total) by mouth daily. Patient taking differently: Take 12.5 mg by mouth daily as needed.  06/05/17 09/03/17 Yes Rogelia Mire, NP  glucose blood (ONE TOUCH ULTRA TEST) test strip Check blood sugar once daily and as instructed. Dx E11.9 01/19/16   [name], MD      VITAL SIGNS:  Blood pressure 127/66, pulse 76, temperature 98.5 F (36.9 C), temperature source Oral, resp. rate 18, height 6\' 1"  (1.854 m), weight 184 lb (83.5 kg), SpO2 100 %.  PHYSICAL EXAMINATION:  Physical Exam  GENERAL:  46 y.o.-year-old patient lying in the bed with no acute distress.  EYES: Pupils equal, round, reactive to light and accommodation. No scleral icterus. Extraocular muscles intact.  HEENT: Head atraumatic, normocephalic. Oropharynx and nasopharynx clear.  NECK:  Supple, no jugular venous distention. No thyroid enlargement, no tenderness.  LUNGS: Normal breath sounds bilaterally, no wheezing, rales,rhonchi or crepitation. No use of accessory muscles of respiration.  CARDIOVASCULAR: S1, S2 normal. No murmurs, rubs, or gallops.  ABDOMEN: Soft, nontender, nondistended. Bowel sounds present. No organomegaly or mass.  EXTREMITIES: No pedal edema, cyanosis, or clubbing.  NEUROLOGIC: Cranial nerves II through XII are intact. Muscle strength 5/5 in all extremities. Sensation intact. Gait not checked.  PSYCHIATRIC: The patient is alert and oriented x 3.  SKIN: No obvious rash, lesion, or ulcer.   LABORATORY PANEL:   CBC No results for input(s): WBC, HGB, HCT, PLT in the last 168 hours. ------------------------------------------------------------------------------------------------------------------  Chemistries   Recent Labs Lab 07/18/17 0916  K 3.6    ------------------------------------------------------------------------------------------------------------------  Cardiac Enzymes No results for input(s): TROPONINI in the last 168 hours. ------------------------------------------------------------------------------------------------------------------  RADIOLOGY:  Dg Cervical Spine 2 Or 3 Views  Result Date: 07/24/2017 CLINICAL DATA:  Status post cervical spinal fusion postop. EXAM: CERVICAL SPINE - 2-3 VIEW COMPARISON:  Intraoperative exams from earlier on the same day. FINDINGS: Cervical disc arthroplasty devices noted at C5-6 and C6-7. No immediate postoperative complications. No significant prevertebral soft tissue swelling. No malalignment. IMPRESSION: No immediate postoperative complications status post C5-6 and C6-7 cervical disc arthroplasties. Electronically Signed   By: Ashley Royalty M.D.   On: 07/24/2017 16:18   Dg Cervical Spine 2-3 Views  Result Date: 07/24/2017 CLINICAL DATA:  Surgical fusion of C5-6 and C6-7. EXAM: DG C-ARM 61-120 MIN; CERVICAL SPINE - 2-3 VIEW Radiation exposure index:  3.47 mGy. COMPARISON:  None. FINDINGS: Seven intraoperative fluoroscopic images were obtained of the cervical spine. These images demonstrate the patient be status post surgical interbody fusion of C5-6 and C6-7. Good alignment of vertebral bodies is noted. IMPRESSION: Status post surgical fusion of C5-6 and C6-7. Electronically Signed   By: Marijo Conception, M.D.   On: 07/24/2017 13:13   Dg C-arm 61-120 Min  Result Date: 07/24/2017 CLINICAL DATA:  Surgical fusion of C5-6 and C6-7. EXAM: DG C-ARM 61-120 MIN; CERVICAL SPINE - 2-3 VIEW Radiation exposure index:  3.47 mGy. COMPARISON:  None. FINDINGS: Seven intraoperative fluoroscopic images were obtained of the cervical spine. These images demonstrate the patient be status post surgical interbody fusion of C5-6 and C6-7. Good alignment of vertebral bodies is noted. IMPRESSION: Status post surgical  fusion of C5-6 and C6-7. Electronically Signed   By: Marijo Conception, M.D.   On: 07/24/2017 13:13      IMPRESSION AND PLAN:   Chronic systolic and diastolic CHF. Ejection fraction 20-25%. Continue Lasix, spironolactone, Coreg and Entresto. Discontinue normal saline IV.  Diabetes. BS 225.  Changed to heart healthy and diabetes diet.  Continue metformin and add sliding scale.  GERD. Stable.  The patient is medically stable. Sign off. Thank you for the consult. All the records are reviewed and case discussed with ED provider. Management plans discussed with the patient, his wife and they are in agreement.  CODE STATUS: Full code  TOTAL TIME TAKING CARE OF THIS PATIENT: 45 minutes.    Demetrios Loll M.D on 07/24/2017 at 4:52 PM  Between 7am to 6pm - Pager - [phone]  After 6pm go to [email or web address] - Proofreader  [facility]  Office  [phone]  CC: Primary care physician; [name], MD   Note: This dictation was prepared with Dragon dictation along with smaller phrase technology. Any transcriptional errors that result from  this process are unin

## 2017-07-24 NOTE — Anesthesia Preprocedure Evaluation (Signed)
Anesthesia Evaluation  Patient identified by MRN, date of birth, ID band Patient awake    Reviewed: Allergy & Precautions, H&P , NPO status , Patient's Chart, lab work & pertinent test results, reviewed documented beta blocker date and time   Airway Mallampati: II  TM Distance: >3 FB Neck ROM: full    Dental  (+) Teeth Intact   Pulmonary neg pulmonary ROS, sleep apnea ,    Pulmonary exam normal        Cardiovascular Exercise Tolerance: Poor hypertension, + Past MI, +CHF and + DOE  (-) Orthopnea and (-) PND negative cardio ROS Normal cardiovascular exam+ dysrhythmias + pacemaker + Cardiac Defibrillator  Rhythm:regular Rate:Normal     Neuro/Psych negative neurological ROS  negative psych ROS   GI/Hepatic negative GI ROS, Neg liver ROS, GERD  Medicated,  Endo/Other  negative endocrine ROSdiabetes, Type 1, Insulin Dependent  Renal/GU negative Renal ROS  negative genitourinary   Musculoskeletal   Abdominal   Peds  Hematology negative hematology ROS (+) anemia ,   Anesthesia Other Findings Past Medical History: 11/20/2016: Acute blood loss anemia 06/26/2017: AICD (automatic cardioverter/defibrillator) present     Comment:  BIV No date: Chronic combined systolic (congestive) and diastolic  (congestive) heart failure (H[facility])     Comment:  a. 11/2016 Ehco: EF 20-25%, glob HK, antsept, ant, apical              HK, Gr1 DD, mild MR, mildly dil LA, nl RV fxn; b. 05/2017               Echo: Ef 20-25%, Gr2 DD, prominent apical trabeculations. No date: Diabetes mellitus ([place]) No date: Family history of adverse reaction to anesthesia     Comment:  mom had a hard time waking up No date: GERD (gastroesophageal reflux disease) No date: NICM (nonischemic cardiomyopathy) ([place])     Comment:  a. 11/2016 Cath: nl cors, EF 25-30%; b. 11/2016 Echo: EF               20-25%; c. 05/2017 Echo: EF 20-25%. No date: Slipped intervertebral disc  Comment:  L4 L5 No date: STEMI (ST elevation myocardial infarction) ([place])     Comment:  a. 11/2016 ST elevation -->Nl cors on cath. Past Surgical History: 06/26/2017: BIV ICD INSERTION CRT-D 06/26/2017: BIV ICD INSERTION CRT-D; N/A     Comment:  Procedure: BIV ICD INSERTION CRT-D;  Surgeon: [name], MD;  Location: [place] CV LAB;  Service:              Cardiovascular;  Laterality: N/A; 12/03/2016: COLONOSCOPY WITH PROPOFOL; N/A     Comment:  Procedure: COLONOSCOPY WITH PROPOFOL;  Surgeon: [name], MD;  Location: [facility] ENDOSCOPY;  Service: Endoscopy;              Laterality: N/A; 12/03/2016: ESOPHAGOGASTRODUODENOSCOPY (EGD) WITH PROPOFOL; N/A     Comment:  Procedure: ESOPHAGOGASTRODUODENOSCOPY (EGD) WITH               PROPOFOL;  Surgeon: [name], MD;  Location: [facility]               ENDOSCOPY;  Service: Endoscopy;  Laterality: N/A; 2012: FINGER SURGERY; Right     Comment:  index finger No date: GASTRIC BYPASS 01/07/2017: GIVENS CAPSULE STUDY; N/A     Comment:  Procedure:  GIVENS CAPSULE STUDY;  Surgeon: [name],               MD;  [facility];  Service: Endoscopy;                Laterality: N/A; 12/04/2016: LEFT HEART CATH AND CORONARY ANGIOGRAPHY; N/A     Comment:  Procedure: Left Heart Cath and Coronary Angiography;                Surgeon: [name], MD;  Location: [place] CV              LAB;  Service: Cardiovascular;  Laterality: N/A; BMI    Body Mass Index:  24.28 kg/m     Reproductive/Obstetrics negative OB ROS                             Anesthesia Physical Anesthesia Plan  ASA: III  Anesthesia Plan: General ETT   Post-op Pain Management:    Induction:   PONV Risk Score and Plan: 3 and Ondansetron, Dexamethasone, Midazolam and Propofol infusion  Airway Management Planned:   Additional Equipment:   Intra-op Plan:   Post-operative Plan:   Informed Consent: I have  reviewed the patients History and Physical, chart, labs and discussed the procedure including the risks, benefits and alternatives for the proposed anesthesia with the patient or authorized representative who has indicated his/her understanding and acceptance.   Dental Advisory Given  Plan Discussed with: CRNA  Anesthesia Plan Comments:         Anesthesia Quick Evaluation

## 2017-07-24 NOTE — Anesthesia Postprocedure Evaluation (Signed)
Anesthesia Post Note  Patient: Philip Ponce  Procedure(s) Performed: CERVICAL ANTERIOR DISC ARTHROPLASTY C5-C7 (N/A )  Patient location during evaluation: PACU Anesthesia Type: General Level of consciousness: awake and alert Pain management: pain level controlled Vital Signs Assessment: post-procedure vital signs reviewed and stable Respiratory status: spontaneous breathing, nonlabored ventilation, respiratory function stable and patient connected to nasal cannula oxygen Cardiovascular status: blood pressure returned to baseline and stable Postop Assessment: no apparent nausea or vomiting Anesthetic complications: no Comments: I spoke with the [employer] representative.  He reported that the patient does not require pacemaker ICD interrogation if a magnet was placed on the device and the device behaved normally during the case.     Last Vitals:  Vitals:   07/24/17 1426 07/24/17 1432  BP:  131/83  Pulse: 74 74  Resp: (!) 0 14  Temp:  (!) 36.4 C  SpO2: 96% 97%    Last Pain:  Vitals:   07/24/17 1432  TempSrc:   PainSc: 1                  Precious Haws Gillermo Poch

## 2017-07-24 NOTE — Op Note (Signed)
Indications: Philip Ponce is a 46 yo male with history of cervical radiculopathy affecting the L C6 and C7 nerve roots.  He failed conservative management and elected for surgical intervention.    Findings: Large disc herniation at C6-7, calcified disc herniation at C5-6  Preoperative Diagnosis: Cervical radiculopathy Postoperative Diagnosis: same   EBL: 250 ml IVF: 900 ml Drains: none Disposition: Extubated and Stable to PACU Complications: none  No foley catheter was placed.   Preoperative Note:   Risks of surgery discussed include: infection, bleeding, stroke, coma, death, paralysis, CSF leak, nerve/spinal cord injury, numbness, tingling, weakness, complex regional pain syndrome, recurrent stenosis and/or disc herniation, vascular injury, development of instability, neck/back pain, need for further surgery, persistent symptoms, development of deformity, and the risks of anesthesia. They understood these risks and have agreed to proceed.  Operative Note:   Operative Note:  Procedure:  1) Cervical Disc Arthroplasty at C5/6 and C6/7 using a LDR Mobi-C device   Procedure: After obtaining informed consent, the patient taken to the operating room, placed in supine position, general anesthesia induced.  The patient had a small shoulder roll placed behind the neck.  The patient received preop antibiotics and IV Decadron.  The patient had a neck incision outlined, was prepped and draped in usual sterile fashion. The incision was injected with local anesthetic.   An incision was opened, dissection taken down medial to the carotid artery and jugular vein, lateral to the trachea and esophagus.  The prevertebral fascia identified and a localizing x-ray demonstrated the correct level.  The longus colli were dissected laterally, and self-retaining retractors placed to open the operative field. The microscope was then brought into the field.  With this complete, distractor pins were placed in the  vertebral bodies of C5 and C7. The distractor was placed at the superior level, and the anulus at C6/7 was opened using a bovie.  Curettes and pituitary rongeurs used to remove the majority of disk, then the drill was used to remove the posterior osteophyte and begin the foraminotomies. The nerve hook was used to elevate the posterior longitudinal ligament, which was then removed with Kerrison rongeurs. The microblunt nerve hook could be passed out the foramen bilateral at each level.   Meticulous hemostasis was obtained.  A trial spacer was used to size the disc space. Using flouroscopic guidance, a 17 mm width x 15 mm depth x 6 mm height Mobi-C was then inserted in the prepared disc space.  The caspar distractor was then moved to the inferior level, and the superior caspar pin removed and bone wax placed for hemostasis. The anulus at C5/6 was opened using a bovie.  Curettes and pituitary rongeurs used to remove the majority of disk, then the drill was used to remove the posterior osteophyte and begin the foraminotomies. The nerve hook was used to elevate the posterior longitudinal ligament, which was then removed with Kerrison rongeurs. The microblunt nerve hook could be passed out the foramen bilateral at each level.   Meticulous hemostasis was obtained.  A trial spacer was used to size the disc space. Using flouroscopic guidance, a 17 mm width x 15 mm depth x 6 mm height Mobi-C was then inserted in the prepared disc space.  The caspar distractor was removed, and bone wax used for hemostasis. Final AP and lateral radiographs were taken.   With the disc arthroplasties in good position, the wound was irrigated copiously with bacitracin-containing solution and meticulous hemostasis obtained.  Wound was closed in 2  layers using interrupted inverted 3-0 Vicryl sutures.  The wound was dressed with dermabond, the head of bed at 30 degrees, taken to recovery room in stable condition.  No new postop neurological  deficits were identified.  Sponge and pattie counts were correct at the end of the procedure.   I performed the entire procedure with the assistance of Marin Olp PA as an Pensions consultant.  [name] MD

## 2017-07-24 NOTE — Anesthesia Post-op Follow-up Note (Signed)
Anesthesia QCDR form completed.        

## 2017-07-24 NOTE — Transfer of Care (Signed)
Immediate Anesthesia Transfer of Care Note  Patient: Philip Ponce  Procedure(s) Performed: CERVICAL ANTERIOR DISC ARTHROPLASTY C5-C7 (N/A )  Patient Location: PACU  Anesthesia Type:General  Level of Consciousness: sedated  Airway & Oxygen Therapy: Patient Spontanous Breathing and Patient connected to face mask oxygen  Post-op Assessment: Report given to RN and Post -op Vital signs reviewed and stable  Post vital signs: Reviewed and stable  Last Vitals:  Vitals:   07/24/17 0851  BP: (!) 145/104  Pulse: 71  Resp: 16  Temp: (!) 36.2 C  SpO2: 100%    Last Pain:  Vitals:   07/24/17 0851  TempSrc: Oral         Complications: No apparent anesthesia complications

## 2017-07-24 NOTE — Anesthesia Procedure Notes (Addendum)
Arterial Line Insertion Start/End10/31/2018 10:13 AM, 07/24/2017 10:20 AM Performed by: Molli Barrows, anesthesiologist  Patient location: OR. Preanesthetic checklist: patient identified, IV checked, site marked, risks and benefits discussed, surgical consent, monitors and equipment checked, pre-op evaluation, timeout performed and anesthesia consent Lidocaine 1% used for infiltration Left, radial was placed Catheter size: 20 Fr Hand hygiene performed  and maximum sterile barriers used   Attempts: 1 Following insertion, dressing applied. Post procedure assessment: normal and unchanged

## 2017-07-24 NOTE — H&P (Signed)
I have reviewed and confirmed my history and physical from 07/11/17 with no additions or changes. Plan for cervical arthroplasty C5-7.  Risks and benefits reviewed.  Heart sounds normal no MRG. Chest Clear to Auscultation Bilaterally.

## 2017-07-24 NOTE — Anesthesia Procedure Notes (Addendum)
Procedure Name: Intubation Performed by: Demetrius Charity Pre-anesthesia Checklist: Patient identified, Patient being monitored, Timeout performed, Emergency Drugs available and Suction available Patient Re-evaluated:Patient Re-evaluated prior to induction Oxygen Delivery Method: Circle system utilized Preoxygenation: Pre-oxygenation with 100% oxygen Induction Type: IV induction Ventilation: Mask ventilation without difficulty and Oral airway inserted - appropriate to patient size Laryngoscope Size: Glidescope and 4 Grade View: Grade II Tube type: Oral Tube size: 7.5 mm Number of attempts: 1 Airway Equipment and Method: Stylet and Video-laryngoscopy Placement Confirmation: ETT inserted through vocal cords under direct vision,  positive ETCO2 and breath sounds checked- equal and bilateral Secured at: 23 cm Tube secured with: Tape Dental Injury: Teeth and Oropharynx as per pre-operative assessment  Comments: Head/neck in neutral position throughout intubation.

## 2017-07-24 NOTE — Progress Notes (Signed)
Inpatient Diabetes Program Recommendations  AACE/ADA: New Consensus Statement on Inpatient Glycemic Control (2015)  Target Ranges:  Prepandial:   less than 140 mg/dL      Peak postprandial:   less than 180 mg/dL (1-2 hours)      Critically ill patients:  140 - 180 mg/dL   Lab Results  Component Value Date   GLUCAP 200 (H) 07/24/2017   HGBA1C 6.9 (H) 07/18/2017    Review of Glycemic Control  Results for QUINDON, DENKER (MRN [id number]) as of 07/24/2017 13:29  Ref. Range 07/24/2017 08:36  Glucose-Capillary Latest Ref Range: 65 - 99 mg/dL 200 (H)    Diabetes history: Type 2 Outpatient Diabetes medications: Toujeo 15 units qam, Metformin 1000mg  bid Current orders for Inpatient glycemic control: Metformin 1000mg  bid  Inpatient Diabetes Program Recommendations:  Patient had steroids prior to surgery and takes insulin at home.   Consider adding Lantus 8 units qam starting tomorrow, CBG tid and hs Novolog 0-9 units tid and Novolog 0-5 units qhs  Gentry Fitz, RN, [place], [place], CDE Diabetes Coordinator Inpatient Diabetes Program  [phone] (Team Pager) [phone] ([place]) 07/24/2017 1:34 PM

## 2017-07-24 NOTE — Progress Notes (Signed)
    Attending Progress Note  History: Philip Ponce is here for C5-7 arthroplasty. His pain is much better after surgery  Physical Exam: Vitals:   07/24/17 1457 07/24/17 1653  BP: 127/66 134/78  Pulse: 76 78  Resp: 18 18  Temp: 98.5 F (36.9 C) 98.8 F (37.1 C)  SpO2: 100% 100%    AA Ox3 CNI  Strength:5/5 throughout BUE except L triceps, which is 4+/5  Data:   Recent Labs Lab 07/18/17 0916  K 3.6   No results for input(s): AST, ALT, ALKPHOS in the last 168 hours.  Invalid input(s): TBILI   No results for input(s): WBC, HGB, HCT, PLT in the last 168 hours.  Recent Labs Lab 07/18/17 0916  APTT 33  INR 1.11         Other tests/results: none  Assessment/Plan:  Philip Ponce is doing well after C5-7 arthroplasty  - mobilize - pain control   [name] MD, [facility] Department of Neurosurgery

## 2017-07-25 DIAGNOSIS — M50122 Cervical disc disorder at C5-C6 level with radiculopathy: Secondary | ICD-10-CM | POA: Diagnosis not present

## 2017-07-25 LAB — GLUCOSE, CAPILLARY: GLUCOSE-CAPILLARY: 128 mg/dL — AB (ref 65–99)

## 2017-07-25 MED ORDER — OXYCODONE HCL 5 MG PO TABS: 5.0000 mg | ORAL_TABLET | ORAL | 0 refills | Status: DC | PRN

## 2017-07-25 MED ORDER — METHOCARBAMOL 500 MG PO TABS: 500.0000 mg | ORAL_TABLET | Freq: Four times a day (QID) | ORAL | 0 refills | Status: DC | PRN

## 2017-07-25 MED ORDER — IBUPROFEN 600 MG PO TABS: 600.0000 mg | ORAL_TABLET | Freq: Three times a day (TID) | ORAL | 0 refills | Status: DC

## 2017-07-25 NOTE — Discharge Summary (Signed)
  History: Philip Ponce is POD1 s/p C5-C7 arthroplasty for cervical radiculopathy. Patient tolerated procedure well without complications. Left arm pain present before surgery has resolved, but weakness persists. Complains of left sided and posterior neck pain rated 2/10. He is swallowing without issue and tolerating solid food. Patient has voided and ambulated without issue.  Controlled with tylenol, oxycodone, and robaxin.  Physical Exam: Vitals:   07/25/17 0509 07/25/17 0843  BP: 128/81 (!) 144/91  Pulse: 75   Resp: 16   Temp: 98.2 F (36.8 C)   SpO2: 99%     AA Ox3 CNI Skin: incision site intact. Glue present. No drainage, minimal swelling.  Strength:5/5 throughout, except throughout left arm 4/5. Left Grip strength 4+ Sensation intact upper and lower extremities.   Data:   Recent Labs Lab 07/18/17 0916  K 3.6   No results for input(s): AST, ALT, ALKPHOS in the last 168 hours.  Invalid input(s): TBILI   No results for input(s): WBC, HGB, HCT, PLT in the last 168 hours.  Recent Labs Lab 07/18/17 0916  APTT 33  INR 1.11         Other tests/results: EXAM: CERVICAL SPINE - 2-3 VIEW  COMPARISON:  Intraoperative exams from earlier on the same day.  FINDINGS: Cervical disc arthroplasty devices noted at C5-6 and C6-7. No immediate postoperative complications. No significant prevertebral soft tissue swelling. No malalignment.  IMPRESSION: No immediate postoperative complications status post C5-6 and C6-7 cervical disc arthroplasties.   Electronically Signed   By: Ashley Royalty M.D.   On: 07/24/2017 16:18 Assessment/Plan:  Philip Ponce is doing well POD1 s/p C5-C7 arthroplasty. Pain will continue to be controlled with tylenol, oxycodone, and robaxin. Advised no bending, twisting, or lifting greater than 10 pounds for 6 weeks. Driving restrictions while under the influence of narcotics. Patient expressed understanind.  Advised if any questions  or concerns arise, to contact office. Otherwise patient will follow up in clinic in 2 weeks to monitor progress.   Marin Olp PA-C Department of Neurosurgery

## 2017-07-25 NOTE — Progress Notes (Signed)
Patient discharged via private vehicle to home. D/C instructions reviewed and follow up appointment discussed. Prescription given. IV removed with out complication. Patient discharged via w/c.

## 2017-07-25 NOTE — Discharge Instructions (Signed)
Your surgeon has performed an operation on your cervical spine (neck) to relieve pressure on the spinal cord and/or nerves. This involved making an incision in the front of your neck and removing one or more of the discs that support your spine. Next, a small piece of bone, a titanium plate, and screws were used to fuse two or more of the vertebrae (bones) together.  The following are instructions to help in your recovery once you have been discharged from the hospital. Even if you feel well, it is important that you follow these activity guidelines. If you do not let your neck heal properly from the surgery, you can increase the chance of return of your symptoms and other complications.  Activity    No bending, lifting, or twisting (BLT). Avoid lifting objects heavier than 10 pounds (gallon milk jug).  Where possible, avoid household activities that involve lifting, bending, reaching, pushing, or pulling such as laundry, vacuuming, grocery shopping, and childcare. Try to arrange for help from friends and family for these activities while your back heals.  Increase physical activity slowly as tolerated.  Taking short walks is encouraged, but avoid strenuous exercise. Do not jog, run, bicycle, lift weights, or participate in any other exercises unless specifically allowed by your doctor.  Talk to your doctor before resuming sexual activity.  You should not drive until cleared by your doctor.  Until released by your doctor, you should not return to work or school.  You should rest at home and let your body heal.   You may shower three days after your surgery.  After showering, lightly dab your incision dry. Do not take a tub bath or go swimming until approved by your doctor at your follow-up appointment.  If your doctor ordered a cervical collar (neck brace) for you, you should wear it whenever you are out of bed. You may remove it when lying down or sleeping, but you should wear it at all other  times. Not all neck surgeries require a cervical collar.  If you smoke, we strongly recommend that you quit.  Smoking has been proven to interfere with normal bone healing and will dramatically reduce the success rate of your surgery. Please contact [facility] (800-QUIT-NOW) and use the resources at www.[facility].com for assistance in stopping smoking.  Surgical Incision   If you have a dressing on your incision, you may remove it two days after your surgery. Keep your incision area clean and dry.  If you have staples or stitches on your incision, you should have a follow up scheduled for removal. If you do not have staples or stitches, you will have steri-strips (small pieces of surgical tape) or Dermabond glue. The steri-strips/glue should begin to peel away within about a week (it is fine if the steri-strips fall off before then). If the strips are still in place one week after your surgery, you may gently remove them.  Diet           You may return to your usual diet. However, you may experience discomfort when swallowing in the first month after your surgery. This is normal. You may find that softer foods are more comfortable for you to swallow. Be sure to stay hydrated.  When to Contact us  You may experience pain in your neck and/or pain between your shoulder blades. This is normal and should improve in the next few weeks with the help of pain medication, muscle relaxers, and rest. Some patients report that a warm compress  on the back of the neck or between the shoulder blades helps.  However, should you experience any of the following, contact us immediately:  New numbness or weakness  Pain that is progressively getting worse, and is not relieved by your pain medication, muscle relaxers, rest, and warm compresses  Bleeding, redness, swelling, pain, or drainage from surgical incision  Chills or flu-like symptoms  Fever greater than 101.0 F (38.3 C)  Inability to eat, drink  fluids, or take medications  Problems with bowel or bladder functions  Difficulty breathing or shortness of breath  Warmth, tenderness, or swelling in your calf Contact Information  During office hours (Monday-Friday 9 am to 5 pm), please call your physician at [phone] and ask for Berdine Addison  After hours and weekends, please call the [place] Operator at [phone] and ask for the Neurosurgery Resident On Call   For a life-threatening emergency, call 911

## 2017-08-01 ENCOUNTER — Ambulatory Visit (INDEPENDENT_AMBULATORY_CARE_PROVIDER_SITE_OTHER): Payer: 59 | Admitting: Podiatry

## 2017-08-01 ENCOUNTER — Encounter: Payer: Self-pay | Admitting: Podiatry

## 2017-08-01 DIAGNOSIS — M79676 Pain in unspecified toe(s): Secondary | ICD-10-CM

## 2017-08-01 DIAGNOSIS — B351 Tinea unguium: Secondary | ICD-10-CM

## 2017-08-01 NOTE — Progress Notes (Signed)
Complaint:  Visit Type: Patient returns to my office for continued preventative foot care services. Complaint: Patient states" my nails have grown long and thick and become painful to walk and wear shoes" Patient has been diagnosed with DM with no foot complications. The patient presents for preventative foot care services. No changes to ROS  Podiatric Exam: Vascular: dorsalis pedis and posterior tibial pulses are palpable bilateral. Capillary return is immediate. Temperature gradient is WNL. Skin turgor WNL  Sensorium: Normal Semmes Weinstein monofilament test. Normal tactile sensation bilaterally. Nail Exam: Pt has thick disfigured discolored nails with subungual debris noted bilateral entire nail hallux through fifth toenails Ulcer Exam: There is no evidence of ulcer or pre-ulcerative changes or infection. Orthopedic Exam: Muscle tone and strength are WNL. No limitations in general ROM. No crepitus or effusions noted. Foot type and digits show no abnormalities. Bony prominences are unremarkable. HAV  B/L.  Pes planus.  Skin: No Porokeratosis. No infection or ulcers  Diagnosis:  Onychomycosis, , Pain in right toe, pain in left toes  Treatment & Plan Procedures and Treatment: Consent by patient was obtained for treatment procedures. The patient understood the discussion of treatment and procedures well. All questions were answered thoroughly reviewed. Debridement of mycotic and hypertrophic toenails, 1 through 5 bilateral and clearing of subungual debris. No ulceration, no infection noted.  Return Visit-Office Procedure: Patient instructed to return to the office for a follow up visit 3 months for continued evaluation and treatment.    Gardiner Barefoot DPM

## 2017-08-05 ENCOUNTER — Encounter: Payer: Self-pay | Admitting: Family

## 2017-08-05 ENCOUNTER — Ambulatory Visit: Payer: 59 | Attending: Family | Admitting: Family

## 2017-08-05 ENCOUNTER — Other Ambulatory Visit: Payer: Self-pay

## 2017-08-05 VITALS — BP 143/79 | HR 88 | Resp 18 | Ht 73.0 in | Wt 186.4 lb

## 2017-08-05 DIAGNOSIS — M5126 Other intervertebral disc displacement, lumbar region: Secondary | ICD-10-CM | POA: Diagnosis not present

## 2017-08-05 DIAGNOSIS — E119 Type 2 diabetes mellitus without complications: Secondary | ICD-10-CM

## 2017-08-05 DIAGNOSIS — I252 Old myocardial infarction: Secondary | ICD-10-CM | POA: Diagnosis not present

## 2017-08-05 DIAGNOSIS — I5022 Chronic systolic (congestive) heart failure: Secondary | ICD-10-CM | POA: Diagnosis present

## 2017-08-05 DIAGNOSIS — Z794 Long term (current) use of insulin: Secondary | ICD-10-CM | POA: Diagnosis not present

## 2017-08-05 DIAGNOSIS — Z9884 Bariatric surgery status: Secondary | ICD-10-CM | POA: Insufficient documentation

## 2017-08-05 DIAGNOSIS — R0609 Other forms of dyspnea: Secondary | ICD-10-CM | POA: Diagnosis not present

## 2017-08-05 DIAGNOSIS — K219 Gastro-esophageal reflux disease without esophagitis: Secondary | ICD-10-CM | POA: Insufficient documentation

## 2017-08-05 DIAGNOSIS — M5412 Radiculopathy, cervical region: Secondary | ICD-10-CM | POA: Insufficient documentation

## 2017-08-05 DIAGNOSIS — Z79899 Other long term (current) drug therapy: Secondary | ICD-10-CM | POA: Insufficient documentation

## 2017-08-05 DIAGNOSIS — I110 Hypertensive heart disease with heart failure: Secondary | ICD-10-CM | POA: Diagnosis not present

## 2017-08-05 DIAGNOSIS — Z823 Family history of stroke: Secondary | ICD-10-CM | POA: Diagnosis not present

## 2017-08-05 DIAGNOSIS — Z95810 Presence of automatic (implantable) cardiac defibrillator: Secondary | ICD-10-CM | POA: Insufficient documentation

## 2017-08-05 DIAGNOSIS — I10 Essential (primary) hypertension: Secondary | ICD-10-CM

## 2017-08-05 LAB — GLUCOSE, CAPILLARY: GLUCOSE-CAPILLARY: 195 mg/dL — AB (ref 65–99)

## 2017-08-05 NOTE — Patient Instructions (Signed)
Continue weighing daily and call for an overnight weight gain of > 2 pounds or a weekly weight gain of >5 pounds. 

## 2017-08-05 NOTE — Progress Notes (Signed)
Patient ID: Philip Ponce, male    DOB: 1970-12-17, 46 y.o.   MRN: [id number]  HPI  Mr Philip Ponce is a 35 yoM with PMH significant for DM, iron deficiency, HTN, gastric bypass, STEMI and CHF with reduced ejection fraction.  Last echo was done 12/05/16 and showed an EF of 20-25% with mild MR. Echo on 04/14/15 showed EF of 50-55%.  Admitted 07/24/17 for C5-C7 arthroplasty. Was discharged the following day. Admitted 06/26/17 for ICD implantation and was discharged the next day. Was in the ED 04/14/17 due to left shoulder pain. Was treated and released. Was in the ED 04/08/17 due to left shoulder pain. Treated and released.    Presents today for a follow-up visit with a chief complaint of mild fatigue upon moderate exertion. He describes this as chronic in nature having been present for several years with varying levels of severity. He has associated shortness of breath, light-headedness, difficulty sleeping and neck stiffness along with this. He denies any chest pain, edema, palpitations or weight gain  Past Medical History:  Diagnosis Date  . Acute blood loss anemia 11/20/2016  . 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 Chronic combined systolic (congestive) and diastolic (congestive) heart failure ([place])    a. 11/2016 Ehco: EF 20-25%, glob HK, antsept, ant, apical HK, Gr1 DD, mild MR, mildly dil LA, nl RV fxn; b. 05/2017 Echo: Ef 20-25%, Gr2 DD, prominent apical trabeculations.  . Diabetes mellitus ([place])   . Family history of adverse reaction to anesthesia    mom had a hard time waking up  . GERD (gastroesophageal reflux disease)   . NICM (nonischemic cardiomyopathy) ([place])    a. 11/2016 Cath: nl cors, EF 25-30%; b. 11/2016 Echo: EF 20-25%; c. 05/2017 Echo: EF 20-25%.  . Slipped intervertebral disc    L4 L5  . STEMI (ST elevation myocardial infarction) ([place])    a. 11/2016 ST elevation -->Nl cors on cath.   Past Surgical History:  Procedure Laterality Date  . BIV  ICD INSERTION CRT-D  06/26/2017  . FINGER SURGERY Right 2012   index finger  . GASTRIC BYPASS     Family History  Problem Relation Age of Onset  . Diabetes Mother   . Hypertension Mother   . Cancer Mother        breast  . Diabetes Father   . Hypertension Father   . Cancer Father        colon  . Cancer Maternal Grandfather        prostate  . Stroke Maternal Grandfather        CVA   Social History   Tobacco Use  . Smoking status: Never Smoker  . Smokeless tobacco: Never Used  Substance Use Topics  . Alcohol use: No   Allergies  Allergen Reactions  . Mobic [Meloxicam] Other (See Comments)    Ulcers in stomach eruption   . Diclofenac Palpitations and Other (See Comments)    Fast heart beat, CP,SOB and weakness on one side.   Prior to Admission medications   Medication Sig Start Date End Date Taking? Authorizing Provider  calcium carbonate (OS-CAL) 600 MG TABS tablet Take 600 mg by mouth 2 (two) times daily with a meal.   Yes [provider]  carvedilol (COREG) 6.25 MG tablet Take 1 tablet (6.25 mg total) by mouth 2 (two) times daily. 02/21/17  Yes End, Harrell Gave, MD  furosemide (LASIX) 20 MG tablet Take 20 mg by mouth daily as needed for  fluid.   Yes [provider]  glucose blood (ONE TOUCH ULTRA TEST) test strip Check blood sugar once daily and as instructed. Dx E11.9 01/19/16  Yes Bedsole, Amy E, MD  ibuprofen (ADVIL,MOTRIN) 600 MG tablet Take 1 tablet (600 mg total) by mouth 3 (three) times daily. 07/25/17  Yes Marin Olp, PA-C  Insulin Glargine (TOUJEO SOLOSTAR) 300 UNIT/ML SOPN Inject 15 Units into the skin every morning.    Yes [provider]  metFORMIN (GLUCOPHAGE) 500 MG tablet Take 1,000 mg by mouth 2 (two) times daily with a meal.   Yes [provider]  methocarbamol (ROBAXIN) 500 MG tablet Take 1 tablet (500 mg total) by mouth every 6 (six) hours as needed for muscle spasms. 07/25/17  Yes Marin Olp, PA-C  Multiple  Vitamins-Minerals (MENS MULTI VITAMIN & MINERAL PO) Take 2 tablets by mouth daily. Men's Vita Fusion   Yes [provider]  Omega-3 Fatty Acids (FISH OIL) 1200 MG CAPS Take 1 capsule by mouth daily.   Yes [provider]  omeprazole (PRILOSEC) 40 MG capsule Take 40 mg by mouth daily as needed.    Yes [provider]  oxyCODONE (OXY IR/ROXICODONE) 5 MG immediate release tablet Take 1 tablet (5 mg total) by mouth every 3 (three) hours as needed for moderate pain ((score 4 to 6)). 07/25/17  Yes Marin Olp, PA-C  sacubitril-valsartan (ENTRESTO) 49-51 MG Take 1 tablet by mouth 2 (two) times daily. 02/15/17  Yes Darylene Price A, FNP  spironolactone (ALDACTONE) 25 MG tablet Take 0.5 tablets (12.5 mg total) by mouth daily. Patient taking differently: Take 12.5 mg by mouth daily as needed.  06/05/17 09/03/17 Yes Rogelia Mire, NP   Review of Systems  Constitutional: Positive for fatigue. Negative for appetite change.  HENT: Negative for congestion, postnasal drip and sore throat.   Eyes: Negative.   Respiratory: Positive for shortness of breath. Negative for chest tightness, wheezing and stridor.   Cardiovascular: Negative for chest pain, palpitations and leg swelling.  Gastrointestinal: Negative for abdominal distention and abdominal pain.  Endocrine: Negative.   Genitourinary: Negative.   Musculoskeletal: Positive for arthralgias (left shoulder pain) and neck stiffness. Negative for back pain and neck pain.  Skin: Negative.   Allergic/Immunologic: Negative.   Neurological: Positive for light-headedness. Negative for dizziness.  Hematological: Negative for adenopathy. Does not bruise/bleed easily.  Psychiatric/Behavioral: Positive for sleep disturbance (disrupted sleep pattern due to left shoulder pain). Negative for dysphoric mood. The patient is not nervous/anxious.    Vitals:   08/05/17 0821  BP: (!) 143/79  Pulse: 88  Resp: 18  SpO2: 100%  Weight: 186 lb 6  oz (84.5 kg)  Height: 6\' 1"  (1.854 m)   Wt Readings from Last 3 Encounters:  08/05/17 186 lb 6 oz (84.5 kg)  07/24/17 184 lb (83.5 kg)  07/18/17 184 lb (83.5 kg)    Lab Results  Component Value Date   CREATININE 0.89 06/17/2017   CREATININE 0.85 06/04/2017   CREATININE 0.98 03/22/2017    Physical Exam  Constitutional: He is oriented to person, place, and time. He appears well-developed and well-nourished.  HENT:  Head: Normocephalic and atraumatic.  Neck: Normal range of motion. Neck supple. No JVD present.  Cardiovascular: Normal rate and regular rhythm.  Pulmonary/Chest: Effort normal. He has no wheezes. He has no rales.  Abdominal: Soft. He exhibits no distension. There is no tenderness.  Musculoskeletal: He exhibits no edema or tenderness.  Neurological: He is alert and oriented to  person, place, and time.  Skin: Skin is warm and dry.  Psychiatric: He has a normal mood and affect. His behavior is normal. Thought content normal.  Nursing note and vitals reviewed.   Assessment & Plan:  1. Congestive heart failure with reduced ejection fraction - NYHA class II - euvolemic - weighing daily. Reminded to call for weight gain of > 2 lbs overnight or > 5 lbs in one week - not adding salt. Encouraged following < 2000 mg/day sodium intake. Reading food labels for sodium and carbs - has been walking some with resting when he feels tired/short of breath - saw cardiologist Sharolyn Douglas) 06/04/17  - has had an AICD implanted since he was last here; no shocks delivered as of yet - discussed increasing his entresto or carvedilol at his next visit - sees cardiologist (End) 10/09/17  2. Diabetes - A1c on 07/18/17 was 6.9% - nonfasting glucose in clinic today was 195 - No lows, discussed s/sx of lows - Saw PCP on 12/27/16 (Bedsole)  3. HTN - BP looked good today  - BMP on 06/17/17 reviewed and showed potassium 4.3, sodium 141 and GFR117  4: Cervical radiculopathy- - had surgery  07/24/17 - continues to have some neck stiffness; incision is healing well  Patient did not bring his medications nor a list. Each medication was verbally reviewed with the patient and he was encouraged to bring the bottles to every visit to confirm accuracy of list.  Return in 1 month or sooner for any questions/problems before then.

## 2017-08-06 ENCOUNTER — Ambulatory Visit: Payer: 59 | Admitting: Family

## 2017-08-09 DIAGNOSIS — M7582 Other shoulder lesions, left shoulder: Secondary | ICD-10-CM | POA: Diagnosis not present

## 2017-08-14 ENCOUNTER — Other Ambulatory Visit: Payer: Self-pay | Admitting: Sports Medicine

## 2017-08-14 DIAGNOSIS — M25512 Pain in left shoulder: Secondary | ICD-10-CM

## 2017-08-22 DIAGNOSIS — M5010 Cervical disc disorder with radiculopathy, unspecified cervical region: Secondary | ICD-10-CM | POA: Diagnosis not present

## 2017-08-23 ENCOUNTER — Other Ambulatory Visit: Payer: Self-pay

## 2017-08-23 MED ORDER — SACUBITRIL-VALSARTAN 49-51 MG PO TABS: 1.0000 | ORAL_TABLET | Freq: Two times a day (BID) | ORAL | 0 refills | Status: DC

## 2017-08-28 ENCOUNTER — Ambulatory Visit: Payer: 59 | Admitting: Internal Medicine

## 2017-09-02 ENCOUNTER — Ambulatory Visit
Admission: RE | Admit: 2017-09-02 | Discharge: 2017-09-02 | Disposition: A | Discharge status: Home / Self Care | Payer: 59 | Source: Ambulatory Visit | Attending: Sports Medicine | Admitting: Sports Medicine

## 2017-09-02 ENCOUNTER — Ambulatory Visit: Payer: 59 | Admitting: Family

## 2017-09-02 DIAGNOSIS — S46012A Strain of muscle(s) and tendon(s) of the rotator cuff of left shoulder, initial encounter: Secondary | ICD-10-CM | POA: Diagnosis not present

## 2017-09-02 DIAGNOSIS — M75102 Unspecified rotator cuff tear or rupture of left shoulder, not specified as traumatic: Secondary | ICD-10-CM | POA: Diagnosis not present

## 2017-09-02 DIAGNOSIS — M25512 Pain in left shoulder: Secondary | ICD-10-CM | POA: Insufficient documentation

## 2017-09-02 MED ORDER — IOPAMIDOL (ISOVUE-200) INJECTION 41%
6.0000 mL | Freq: Once | INTRAVENOUS | Status: AC | PRN
  Administered 2017-09-02: 6 mL
  Filled 2017-09-02: qty 50

## 2017-09-02 MED ORDER — LIDOCAINE HCL (PF) 1 % IJ SOLN
3.0000 mL | Freq: Once | INTRAMUSCULAR | Status: AC
  Administered 2017-09-02: 3 mL
  Filled 2017-09-02: qty 4

## 2017-09-02 MED ORDER — SODIUM CHLORIDE 0.9 % IJ SOLN
6.0000 mL | INTRAMUSCULAR | Status: DC | PRN
  Administered 2017-09-02: 6 mL

## 2017-09-10 ENCOUNTER — Encounter: Payer: Self-pay | Admitting: Family

## 2017-09-10 ENCOUNTER — Ambulatory Visit: Payer: 59 | Attending: Family | Admitting: Family

## 2017-09-10 VITALS — BP 136/90 | HR 88 | Resp 18 | Ht 73.0 in | Wt 189.1 lb

## 2017-09-10 DIAGNOSIS — I10 Essential (primary) hypertension: Secondary | ICD-10-CM

## 2017-09-10 DIAGNOSIS — Z9884 Bariatric surgery status: Secondary | ICD-10-CM | POA: Insufficient documentation

## 2017-09-10 DIAGNOSIS — I428 Other cardiomyopathies: Secondary | ICD-10-CM | POA: Diagnosis not present

## 2017-09-10 DIAGNOSIS — I5022 Chronic systolic (congestive) heart failure: Secondary | ICD-10-CM

## 2017-09-10 DIAGNOSIS — E119 Type 2 diabetes mellitus without complications: Secondary | ICD-10-CM

## 2017-09-10 DIAGNOSIS — Z95810 Presence of automatic (implantable) cardiac defibrillator: Secondary | ICD-10-CM | POA: Diagnosis not present

## 2017-09-10 DIAGNOSIS — M5412 Radiculopathy, cervical region: Secondary | ICD-10-CM | POA: Diagnosis not present

## 2017-09-10 DIAGNOSIS — I110 Hypertensive heart disease with heart failure: Secondary | ICD-10-CM | POA: Diagnosis not present

## 2017-09-10 DIAGNOSIS — I252 Old myocardial infarction: Secondary | ICD-10-CM | POA: Diagnosis not present

## 2017-09-10 DIAGNOSIS — I5042 Chronic combined systolic (congestive) and diastolic (congestive) heart failure: Secondary | ICD-10-CM | POA: Diagnosis not present

## 2017-09-10 DIAGNOSIS — Z794 Long term (current) use of insulin: Secondary | ICD-10-CM | POA: Diagnosis not present

## 2017-09-10 DIAGNOSIS — Z79899 Other long term (current) drug therapy: Secondary | ICD-10-CM | POA: Diagnosis not present

## 2017-09-10 DIAGNOSIS — K219 Gastro-esophageal reflux disease without esophagitis: Secondary | ICD-10-CM | POA: Diagnosis not present

## 2017-09-10 MED ORDER — CARVEDILOL 12.5 MG PO TABS: 12.5000 mg | ORAL_TABLET | Freq: Two times a day (BID) | ORAL | 3 refills | Status: DC

## 2017-09-10 NOTE — Patient Instructions (Addendum)
Continue weighing daily and call for an overnight weight gain of > 2 pounds or a weekly weight gain of >5 pounds.  Am increasing your carvedilol to 12.5mg  twice daily. Finish your current bottle by taking 2 tablets twice daily and then when you pick up the new prescription, you will then take 1 tablet twice daily.

## 2017-09-10 NOTE — Progress Notes (Addendum)
Patient ID: Philip Ponce, male    DOB: May 02, 1971, 46 y.o.   MRN: [id number]  HPI  Philip Ponce is a 4 yoM with PMH significant for DM, iron deficiency, HTN, gastric bypass, STEMI and CHF with reduced ejection fraction.  Last echo was done 12/05/16 and showed an EF of 20-25% with mild Philip. Echo on 04/14/15 showed EF of 50-55%.  Admitted 07/24/17 for C5-C7 arthroplasty. Was discharged the following day. Admitted 06/26/17 for ICD implantation and was discharged the next day. Was in the ED 04/14/17 due to left shoulder pain. Was treated and released. Was in the ED 04/08/17 due to left shoulder pain. Treated and released.    Presents today for a follow-up visit with a chief complaint of mild shortness of breath upon moderate exertion. He describes this as chronic in nature having been present for several years with varying levels of severity. He has associated fatigue, light-headedness, left shoulder pain and a few pound weight gain. He denies any chest pain, cough, edema, palpitations or neck pain. Is hoping to have his left rotator cuff repaired soon as his mobility is quite limited in that shoulder.   Past Medical History:  Diagnosis Date  . Acute blood loss anemia 11/20/2016  . 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 Chronic combined systolic (congestive) and diastolic (congestive) heart failure ([place])    a. 11/2016 Ehco: EF 20-25%, glob HK, antsept, ant, apical HK, Gr1 DD, mild Philip, mildly dil LA, nl RV fxn; b. 05/2017 Echo: Ef 20-25%, Gr2 DD, prominent apical trabeculations.  . Diabetes mellitus ([place])   . Family history of adverse reaction to anesthesia    mom had a hard time waking up  . GERD (gastroesophageal reflux disease)   . NICM (nonischemic cardiomyopathy) ([place])    a. 11/2016 Cath: nl cors, EF 25-30%; b. 11/2016 Echo: EF 20-25%; c. 05/2017 Echo: EF 20-25%.  . Slipped intervertebral disc    L4 L5  . STEMI (ST elevation myocardial infarction) ([place])    a.  11/2016 ST elevation -->Nl cors on cath.   Past Surgical History:  Procedure Laterality Date  . BIV ICD INSERTION CRT-D  06/26/2017  . BIV ICD INSERTION CRT-D N/A 06/26/2017   Procedure: BIV ICD INSERTION CRT-D;  Surgeon: [name], MD;  Location: [facility];  Service: Cardiovascular;  Laterality: N/A;  . CERVICAL DISC ARTHROPLASTY N/A 07/24/2017   Procedure: CERVICAL ANTERIOR [place] ARTHROPLASTY C5-C7;  Surgeon: [name], MD;  Location: [facility];  Service: Neurosurgery;  Laterality: N/A;  . COLONOSCOPY WITH PROPOFOL N/A 12/03/2016   Procedure: COLONOSCOPY WITH PROPOFOL;  Surgeon: [name], MD;  Location: [facility];  Service: Endoscopy;  Laterality: N/A;  . ESOPHAGOGASTRODUODENOSCOPY (EGD) WITH PROPOFOL N/A 12/03/2016   Procedure: ESOPHAGOGASTRODUODENOSCOPY (EGD) WITH PROPOFOL;  Surgeon: [name], MD;  Location: [facility] END[facility];  Service: Endoscopy;  Laterality: N/A;  . FINGER SURGERY Right 2012   index finger  . GASTRIC BYPASS    . GIVENS CAPSULE STUDY N/A 01/07/2017   Procedure: GIVENS CAPSULE STUDY;  Surgeon: [name], MD;  Location: [facility] ENDOSCOPY;  Service: Endoscopy;  Laterality: N/A;  . LEFT HEART CATH AND CORONARY ANGIOGRAPHY N/A 12/04/2016   Procedure: Left Heart Cath and Coronary Angiography;  Surgeon: [name], MD;  Location: [facility];  Service: Cardiovascular;  Laterality: N/A;   Family History  Problem Relation Age of Onset  . Diabetes Mother   . Hypertension Mother   . Cancer Mother  breast  . Diabetes Father   . Hypertension Father   . Cancer Father        colon  . Cancer Maternal Grandfather        prostate  . Stroke Maternal Grandfather        CVA   Social History   Tobacco Use  . Smoking status: Never Smoker  . Smokeless tobacco: Never Used  Substance Use Topics  . Alcohol use: No   Allergies  Allergen Reactions  . Mobic [Meloxicam] Other (See Comments)    Ulcers in stomach eruption   .  Diclofenac Palpitations and Other (See Comments)    Fast heart beat, CP,SOB and weakness on one side.   Prior to Admission medications   Medication Sig Start Date End Date Taking? Authorizing Provider  calcium carbonate (OS-CAL) 600 MG TABS tablet Take 600 mg by mouth 2 (two) times daily with a meal.   Yes [provider]  carvedilol (COREG) 6.25 MG tablet Take 1 tablet (6.25 mg total) by mouth 2 (two) times daily. 02/21/17  Yes End, Harrell Gave, MD  furosemide (LASIX) 20 MG tablet Take 20 mg by mouth daily as needed for fluid.   Yes [provider]  glucose blood (ONE TOUCH ULTRA TEST) test strip Check blood sugar once daily and as instructed. Dx E11.9 01/19/16  Yes Bedsole, Amy E, MD  ibuprofen (ADVIL,MOTRIN) 600 MG tablet Take 1 tablet (600 mg total) by mouth 3 (three) times daily. 07/25/17  Yes Marin Olp, PA-C  Insulin Glargine (TOUJEO SOLOSTAR) 300 UNIT/ML SOPN Inject 15 Units into the skin every morning.    Yes [provider]  metFORMIN (GLUCOPHAGE) 500 MG tablet Take 1,000 mg by mouth 2 (two) times daily with a meal.   Yes [provider]  methocarbamol (ROBAXIN) 500 MG tablet Take 1 tablet (500 mg total) by mouth every 6 (six) hours as needed for muscle spasms. 07/25/17  Yes Marin Olp, PA-C  Multiple Vitamins-Minerals (MENS MULTI VITAMIN & MINERAL PO) Take 2 tablets by mouth daily. Men's Vita Fusion   Yes [provider]  Omega-3 Fatty Acids (FISH OIL) 1200 MG CAPS Take 1 capsule by mouth daily.   Yes [provider]  omeprazole (PRILOSEC) 40 MG capsule Take 40 mg by mouth daily as needed.    Yes [provider]  oxyCODONE (OXY IR/ROXICODONE) 5 MG immediate release tablet Take 1 tablet (5 mg total) by mouth every 3 (three) hours as needed for moderate pain ((score 4 to 6)). 07/25/17  Yes Marin Olp, PA-C  sacubitril-valsartan (ENTRESTO) 49-51 MG Take 1 tablet by mouth 2 (two) times daily. 08/23/17  Yes Adaeze Better, Otila Kluver A, FNP   spironolactone (ALDACTONE) 25 MG tablet Take 0.5 tablets (12.5 mg total) by mouth daily. Patient taking differently: Take 12.5 mg by mouth daily as needed.  06/05/17 09/10/17 Yes Rogelia Mire, NP    Review of Systems  Constitutional: Positive for fatigue. Negative for appetite change.  HENT: Positive for congestion and postnasal drip. Negative for sore throat.   Eyes: Negative.   Respiratory: Positive for shortness of breath. Negative for cough, chest tightness, wheezing and stridor.   Cardiovascular: Negative for chest pain, palpitations and leg swelling.  Gastrointestinal: Negative for abdominal distention and abdominal pain.  Endocrine: Negative.   Genitourinary: Negative.   Musculoskeletal: Positive for arthralgias (left shoulder pain). Negative for back pain, neck pain and neck stiffness.  Skin: Negative.   Allergic/Immunologic: Negative.   Neurological: Positive for light-headedness. Negative for  dizziness.  Hematological: Negative for adenopathy. Does not bruise/bleed easily.  Psychiatric/Behavioral: Positive for sleep disturbance (disrupted sleep pattern due to left shoulder pain). Negative for dysphoric mood. The patient is not nervous/anxious.    Vitals:   09/10/17 0850  BP: 136/90  Pulse: 88  Resp: 18  SpO2: 100%  Weight: 189 lb 2 oz (85.8 kg)  Height: 6\' 1"  (1.854 m)   Wt Readings from Last 3 Encounters:  09/10/17 189 lb 2 oz (85.8 kg)  08/05/17 186 lb 6 oz (84.5 kg)  07/24/17 184 lb (83.5 kg)    Lab Results  Component Value Date   CREATININE 0.89 06/17/2017   CREATININE 0.85 06/04/2017   CREATININE 0.98 03/22/2017    Physical Exam  Constitutional: He is oriented to person, place, and time. He appears well-developed and well-nourished.  HENT:  Head: Normocephalic and atraumatic.  Neck: Normal range of motion. Neck supple. No JVD present.  Cardiovascular: Normal rate and regular rhythm.  Pulmonary/Chest: Effort normal. He has no wheezes. He has no  rales.  Abdominal: Soft. He exhibits no distension. There is no tenderness.  Musculoskeletal: He exhibits no edema.       Left shoulder: He exhibits decreased range of motion and tenderness.  Neurological: He is alert and oriented to person, place, and time.  Skin: Skin is warm and dry.  Psychiatric: He has a normal mood and affect. His behavior is normal. Thought content normal.  Nursing note and vitals reviewed.   Assessment & Plan:  1. Congestive heart failure with reduced ejection fraction - NYHA class II - euvolemic - weighing daily. Reminded to call for weight gain of > 2 lbs overnight or > 5 lbs in one week - not adding salt. Encouraged following < 2000 mg/day sodium intake. Reading food labels for sodium and carbs - has been walking some with resting when he feels tired/short of breath - saw cardiologist Sharolyn Douglas) 06/04/17  - no shocks from his AICD although he feels a "quivering". Has mentioned this previously and has had device adjusted - will increase his carvedilol today to 12.5mg  twice daily. He is to use up his current prescription by taking 2 of the 6.25mg  tablets twice daily and then begin the new RX of the 12.5mg  tablet twice daily - consider increasing entresto in the future if able - sees cardiologist (End) 10/09/17 - PharmD reconciled medications with the patient - patient reports receiving his flu vaccine for this season  2. Diabetes - A1c on 07/18/17 was 6.9% - glucose yesterday was 140 - No lows, discussed s/sx of lows - Saw PCP on 12/27/16 (Bedsole)  3. HTN - BP looked good today  - BMP on 06/17/17 reviewed and showed potassium 4.3, sodium 141 and GFR117  4: Cervical radiculopathy- - had surgery 07/24/17 - continues to have some neck stiffness; incision is healing well - left rotator cuff to be repaired soon  Patient did not bring his medications nor a list. Each medication was verbally reviewed with the patient and he was encouraged to bring the bottles to  every visit to confirm accuracy of list.  Return in 2 months or sooner for any questions/problems before then.

## 2017-09-16 ENCOUNTER — Ambulatory Visit: Payer: 59 | Admitting: Cardiology

## 2017-09-18 DIAGNOSIS — M75122 Complete rotator cuff tear or rupture of left shoulder, not specified as traumatic: Secondary | ICD-10-CM | POA: Diagnosis not present

## 2017-09-21 ENCOUNTER — Encounter: Payer: Self-pay | Admitting: Family

## 2017-09-21 ENCOUNTER — Other Ambulatory Visit: Payer: Self-pay | Admitting: Family

## 2017-09-22 ENCOUNTER — Other Ambulatory Visit: Payer: Self-pay | Admitting: Family

## 2017-09-22 MED ORDER — SACUBITRIL-VALSARTAN 49-51 MG PO TABS: 1.0000 | ORAL_TABLET | Freq: Two times a day (BID) | ORAL | 5 refills | Status: DC

## 2017-09-23 ENCOUNTER — Telehealth: Payer: Self-pay | Admitting: *Deleted

## 2017-09-23 NOTE — Telephone Encounter (Signed)
   [facility] Pre-operative Risk Assessment    Request for surgical clearance:  1. What type of surgery is being performed? LEFT ROTATOR CUFF REPAIR/SUBACROMIAL DECOMPRESSION   2. When is this surgery scheduled? 10/04/17   3. Are there any medications that need to be held prior to surgery and how long?PT NOT ON ANY ANTICOAGULANTS   4. Practice name and name of physician performing surgery? [facility]; DR. PATEL   5. What is your office phone and fax number? PH# [phone], FAX # [phone] ATTN: [facility]  6. Anesthesia type (None, local, MAC, general) ? I HAVE CALLED AND LEFT MESSAGE WITH [facility] TO VERIFY WHAT TYPE OF ANESTHESIA IS BEING USED   Julaine Hua 09/23/2017, 1:43 PM  _________________________________________________________________   (provider comments below)

## 2017-09-23 NOTE — Telephone Encounter (Signed)
   Primary Cardiologist:Christopher End, MD  Chart reviewed as part of pre-operative protocol coverage. He has history of DM, NICM s/p BiV-ICD, chronic combined CHF (last EF 20-25%), gastric bypass, GI bleeding, ABL anemia. He underwent CRT-D placement 06/27/17. He has f/u scheduled with Dr. Curt Bears on 10/01/16, which is planned follow-up to make sure cardiac status is stable and re-evaluate recent device implantation. Based on complex cardiac history and upcoming appointment with provider known to patient, would recommend cardiac clearance be discussed at that visit rather than discussing over the phone.  Pre-op covering staff: - Please add "pre-op clearance" modifier to appointment notes - Please contact requesting surgeon's office via preferred method (i.e, phone, fax) to inform them of need for appointment prior to surgery clearance  MD will need to forward office note (or have his covering staff do so) to [facility] and Sports Medicine as below once clearance decision is reached. Will route to Dr. Curt Bears as Juluis Rainier. Will remove this message from pre-op APP box.  Charlie Pitter, PA-C  09/23/2017, 2:54 PM

## 2017-09-23 NOTE — Telephone Encounter (Signed)
Left [facility], Surgery Scheduler, a detailed message letting her know that the pt's cardiac clearance will be addressed on his f/u appt 10/01/17.

## 2017-09-23 NOTE — Telephone Encounter (Signed)
Hope from [facility] called me back and said they will be using General Anesthesia as well as a Saline Block

## 2017-09-23 NOTE — Telephone Encounter (Signed)
   [facility] Pre-operative Risk Assessment    Request for surgical clearance:  1. What type of surgery is being performed? LEFT ROTATOR CUFF REPAIR/SUBACROMIAL DECOMPRESSION   2. When is this surgery scheduled? 10/04/17   3. Are there any medications that need to be held prior to surgery and how long?PT NOT ON ANY ANTICOAGULANTS   4. Practice name and name of physician performing surgery? [facility]; DR. PATEL   5. What is your office phone and fax number? PH# [phone], FAX # [phone] ATTN: [facility]OPE  Anesthesia type (None, local, MAC, general) GENERAL AND SALINE BLOCK  Julaine Hua 09/23/2017, 2:07 PM  _________________________________________________________________   (provider comments below)

## 2017-09-27 ENCOUNTER — Other Ambulatory Visit: Payer: Self-pay

## 2017-09-27 ENCOUNTER — Encounter
Admission: RE | Admit: 2017-09-27 | Discharge: 2017-09-27 | Disposition: A | Discharge status: Home / Self Care | Payer: 59 | Source: Ambulatory Visit | Attending: Orthopedic Surgery | Admitting: Orthopedic Surgery

## 2017-09-27 DIAGNOSIS — Z95810 Presence of automatic (implantable) cardiac defibrillator: Secondary | ICD-10-CM | POA: Insufficient documentation

## 2017-09-27 DIAGNOSIS — Z01812 Encounter for preprocedural laboratory examination: Secondary | ICD-10-CM | POA: Insufficient documentation

## 2017-09-27 DIAGNOSIS — Z01818 Encounter for other preprocedural examination: Secondary | ICD-10-CM | POA: Insufficient documentation

## 2017-09-27 DIAGNOSIS — I10 Essential (primary) hypertension: Secondary | ICD-10-CM | POA: Insufficient documentation

## 2017-09-27 LAB — CBC
HCT: 38.7 % — ABNORMAL LOW (ref 40.0–52.0)
HEMOGLOBIN: 12.2 g/dL — AB (ref 13.0–18.0)
MCH: 28 pg (ref 26.0–34.0)
MCHC: 31.6 g/dL — ABNORMAL LOW (ref 32.0–36.0)
MCV: 88.6 fL (ref 80.0–100.0)
Platelets: 164 10*3/uL (ref 150–440)
RBC: 4.37 MIL/uL — AB (ref 4.40–5.90)
RDW: 13.5 % (ref 11.5–14.5)
WBC: 4.4 10*3/uL (ref 3.8–10.6)

## 2017-09-27 LAB — DIFFERENTIAL
BASOS ABS: 0 10*3/uL (ref 0–0.1)
Basophils Relative: 1 %
Eosinophils Absolute: 0.1 10*3/uL (ref 0–0.7)
Eosinophils Relative: 2 %
LYMPHS ABS: 1.1 10*3/uL (ref 1.0–3.6)
Lymphocytes Relative: 25 %
Monocytes Absolute: 0.5 10*3/uL (ref 0.2–1.0)
Monocytes Relative: 11 %
NEUTROS PCT: 61 %
Neutro Abs: 2.7 10*3/uL (ref 1.4–6.5)

## 2017-09-27 LAB — BASIC METABOLIC PANEL
ANION GAP: 6 (ref 5–15)
BUN: 13 mg/dL (ref 6–20)
CO2: 30 mmol/L (ref 22–32)
Calcium: 8.8 mg/dL — ABNORMAL LOW (ref 8.9–10.3)
Chloride: 103 mmol/L (ref 101–111)
Creatinine, Ser: 0.96 mg/dL (ref 0.61–1.24)
GFR calc non Af Amer: >60 mL/min (ref >60)
Glucose, Bld: 153 mg/dL — ABNORMAL HIGH (ref 65–99)
POTASSIUM: 3.9 mmol/L (ref 3.5–5.1)
Sodium: 139 mmol/L (ref 135–145)

## 2017-09-27 NOTE — Patient Instructions (Addendum)
Your procedure is scheduled OH:YWVPX 10/04/17 Report to Day Surgery. To find out your arrival time please call [phone] between 1PM - 3PM on Thurs 10/03/17.  Remember: Instructions that are not followed completely may result in serious medical risk, up to and including death, or upon the discretion of your surgeon and anesthesiologist your surgery may need to be rescheduled.     _X__ 1. Do not eat food after midnight the night before your procedure.                 No gum chewing or hard candies. You may drink clear liquids up to 2 hours                 before you are scheduled to arrive for your surgery- DO not drink clear                 liquids within 2 hours of the start of your surgery.                 Clear Liquids include:  water, apple juice without pulp, clear carbohydrate                 drink such as Clearfast of Gart[place]ade, Black Coffee or Tea (Do not add                 anything to coffee or tea).     _X__ 2.  No Alcohol for 24 hours before or after surgery.   ___ 3.  Do Not Smoke or use e-cigarettes For 24 Hours Prior to Your Surgery.                 Do not use any chewable tobacco products for at least 6 hours prior to                 surgery.  ____  4.  Bring all medications with you on the day of surgery if instructed.   __x_  5.  Notify your doctor if there is any change in your medical condition      (cold, fever, infections).     Do not wear jewelry, make-up, hairpins, clips or nail polish. Do not wear lotions, powders, or perfumes. You may wear deodorant. Do not shave 48 hours prior to surgery. Men may shave face and neck. Do not bring valuables to the hospital.    [facility] is not responsible for any belongings or valuables.  Contacts, dentures or bridgework may not be worn into surgery. Leave your suitcase in the car. After surgery it may be brought to your room. For patients admitted to the hospital, discharge time is determined by  your treatment team.   Patients discharged the day of surgery will not be allowed to drive home.   Please read over the following fact sheets that you were given:    __x__ Take these medicines the morning of surgery with A SIP OF WATER:    1. carvedilol (COREG) 12.5 MG tablet  2.omeprazole (PRILOSEC) 40 MG capsule take extra dose the night before and morning of surgery   3. oxyCODONE (OXY IR/ROXICODONE) 5 MG immediate release tablet if needed  4.  5.  6.  ____ Fleet Enema (as directed)   _x___ Use CHG Soap as directed  ____ Use inhalers on the day of surgery  _x___ Stop metformin 2 days prior to surgery    _x___ Take 1/2 of usual insulin dose the night before surgery.  No insulin the morning          of surgery.   ____ Stop Coumadin/Plavix/aspirin on   _x___ Stop Anti-inflammatories on naproxen sodium (ALEVE) 220 MG tablet and ibuprofen today but can take tylenol   _x___ Stop supplements until after surgery.  Omega-3 Fatty Acids (FISH OIL PO) today  ____ Bring C-Pap to the hospital.

## 2017-09-30 ENCOUNTER — Inpatient Hospital Stay: Payer: 59 | Attending: Oncology

## 2017-09-30 ENCOUNTER — Telehealth: Payer: Self-pay | Admitting: Internal Medicine

## 2017-09-30 DIAGNOSIS — D509 Iron deficiency anemia, unspecified: Secondary | ICD-10-CM

## 2017-09-30 DIAGNOSIS — D508 Other iron deficiency anemias: Secondary | ICD-10-CM | POA: Insufficient documentation

## 2017-09-30 DIAGNOSIS — Z9884 Bariatric surgery status: Secondary | ICD-10-CM | POA: Insufficient documentation

## 2017-09-30 LAB — IRON AND TIBC
IRON: 59 ug/dL (ref 45–182)
Saturation Ratios: 23 % (ref 17.9–39.5)
TIBC: 254 ug/dL (ref 250–450)
UIBC: 195 ug/dL

## 2017-09-30 LAB — CBC WITH DIFFERENTIAL/PLATELET
BASOS PCT: 1 %
Basophils Absolute: 0 10*3/uL (ref 0–0.1)
Eosinophils Absolute: 0.1 10*3/uL (ref 0–0.7)
Eosinophils Relative: 2 %
HEMATOCRIT: 38.6 % — AB (ref 40.0–52.0)
Hemoglobin: 12.5 g/dL — ABNORMAL LOW (ref 13.0–18.0)
LYMPHS PCT: 23 %
Lymphs Abs: 1.2 10*3/uL (ref 1.0–3.6)
MCH: 28.6 pg (ref 26.0–34.0)
MCHC: 32.4 g/dL (ref 32.0–36.0)
MCV: 88.4 fL (ref 80.0–100.0)
MONOS PCT: 9 %
Monocytes Absolute: 0.4 10*3/uL (ref 0.2–1.0)
NEUTROS ABS: 3.5 10*3/uL (ref 1.4–6.5)
NEUTROS PCT: 65 %
Platelets: 167 10*3/uL (ref 150–440)
RBC: 4.37 MIL/uL — ABNORMAL LOW (ref 4.40–5.90)
RDW: 13.2 % (ref 11.5–14.5)
WBC: 5.3 10*3/uL (ref 3.8–10.6)

## 2017-09-30 LAB — VITAMIN B12: VITAMIN B 12: 423 pg/mL (ref 180–914)

## 2017-09-30 LAB — FERRITIN: Ferritin: 66 ng/mL (ref 24–336)

## 2017-09-30 NOTE — Telephone Encounter (Signed)
   Chart reviewed as part of pre-operative protocol coverage.  This pre-op clearance was already addressed in duplicate phone note 16/60/63. Per my note that day:  Chart reviewed as part of pre-operative protocol coverage. He has history of DM, NICM s/p BiV-ICD, chronic combined CHF (last EF 20-25%), gastric bypass, GI bleeding, ABL anemia. He underwent CRT-D placement 06/27/17. He has f/u scheduled with Dr. Curt Bears on 10/01/16, which is planned follow-up to make sure cardiac status is stable and re-evaluate recent device implantation. Based on complex cardiac history and upcoming appointment with provider known to patient, would recommend cardiac clearance be discussed at that visit rather than discussing over the phone.  Pre-op covering staff: - Please add "pre-op clearance" modifier to appointment notes - Please contact requesting surgeon's office via preferred method (i.e, phone, fax) to inform them of need for appointment prior to surgery clearance  MD will need to forward office note (or have his covering staff do so) to [facility] and Sports Medicine as below once clearance decision is reached.  Will route to Dr. Curt Bears as Juluis Rainier. Will remove this message from pre-op APP box.  Charlie Pitter, PA-C  09/23/2017, 2:54 PM    Pre-op call back staff, please contact requesting party to make them aware that patient is still pending appointment with MD tomorrow prior to clearing. I will also route this message via epic fax function to requesting provider. If they request a different fax number than one provided, please route there.  Charlie Pitter, PA-C 09/30/2017, 2:43 PM

## 2017-09-30 NOTE — Telephone Encounter (Signed)
   [facility] Pre-operative Risk Assessment    Request for surgical clearance:  1. What type of surgery is being performed? Left shoulder arthroscopic -Rotator cuff repair  2. When is this surgery scheduled? 10/04/2017  3. Are there any medications that need to be held prior to surgery and how long? Not listed  Practice name and name of physician performing surgery? Dr. Posey Pronto , practice name not listed, request is from pre admit at [facility] 4. What is your office phone and fax number? [phone], fax [phone]  5. Anesthesia type (None, local, MAC, general) ? Not listed  Pt is seeing Dr. Curt Bears on 1/8 and Dr. Saunders Revel on 1/16   Philip Ponce 09/30/2017, 11:23 AM  _________________________________________________________________   (provider comments below)

## 2017-09-30 NOTE — Telephone Encounter (Signed)
Spoke with Philip Ponce at Dr. Ena Dawley office to let her know that pt has an appt tomorrow, 10/01/17, before he can be cleared for surgery.

## 2017-10-01 ENCOUNTER — Encounter: Payer: Self-pay | Admitting: Cardiology

## 2017-10-01 ENCOUNTER — Ambulatory Visit (INDEPENDENT_AMBULATORY_CARE_PROVIDER_SITE_OTHER): Payer: 59 | Admitting: Cardiology

## 2017-10-01 VITALS — BP 130/98 | HR 70 | Ht 73.0 in | Wt 189.0 lb

## 2017-10-01 DIAGNOSIS — I10 Essential (primary) hypertension: Secondary | ICD-10-CM

## 2017-10-01 DIAGNOSIS — I5022 Chronic systolic (congestive) heart failure: Secondary | ICD-10-CM

## 2017-10-01 DIAGNOSIS — Z95810 Presence of automatic (implantable) cardiac defibrillator: Secondary | ICD-10-CM

## 2017-10-01 NOTE — Patient Instructions (Addendum)
Medication Instructions:  Your physician recommends that you continue on your current medications as directed. Please refer to the Current Medication list given to you today.  *If you need a refill on your cardiac medications before your next appointment, please call your pharmacy*  Labwork: None ordered  Testing/Procedures: None ordered  Follow-Up: Remote monitoring is used to monitor your Pacemaker or ICD from home. This monitoring reduces the number of office visits required to check your device to one time per year. It allows [place] to keep an eye on the functioning of your device to ensure it is working properly. You are scheduled for a device check from home on 12/31/2017. You may send your transmission at any time that day. If you have a wireless device, the transmission will be sent automatically. After your physician reviews your transmission, you will receive a postcard with your next transmission date.  Your physician wants you to follow-up in: 9 months  with Dr. Curt Bears.  You will receive a reminder letter in the mail two months in advance. If you don't receive a letter, please call our office to schedule the follow-up appointment.  Thank you for choosing [facility]!!   Trinidad Curet, RN [phone]

## 2017-10-01 NOTE — Progress Notes (Signed)
Electrophysiology Office Note   Date:  10/01/2017   ID:  Philip Ponce, DOB 10-20-1970, MRN [id number]  PCP:  [name], MD  Cardiologist:  End Primary Electrophysiologist:  Guled Gahan Meredith Leeds, MD    Chief Complaint  Patient presents with  . Defib Check     History of Present Illness: Philip Ponce is a 47 y.o. male who is being seen today for the evaluation of CHF at the request of [name], MD. Presenting today for electrophysiology evaluation. He has history of diabetes, nonischemic cardiomyopathy, status post gastric bypass. He had a GI bleed that occurred in the setting of NSAIDs. March 2018 he was admitted to [facility] regional chest pain, dyspnea, and left bundle-branch block. Catheterization showed normal coronary arteries with an echo that showed an EF of 20-25%. He was placed on carvedilol and S2. Repeat echo shows an ejection fraction of 20-25% with grade 2 diastolic dysfunction.  He had a [employer] by V ICD implanted 06/26/17.  Today, denies symptoms of palpitations, chest pain, shortness of breath, orthopnea, PND, lower extremity edema, claudication, dizziness, presyncope, syncope, bleeding, or neurologic sequela. The patient is tolerating medications without difficulties.  He is continued to have some shortness of breath when he exerts himself.  He is able to do approximately 4 hours of activity without having to stop and sit down or take a nap.   Past Medical History:  Diagnosis Date  . Acute blood loss anemia 11/20/2016  . 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 Chronic combined systolic (congestive) and diastolic (congestive) heart failure ([place])    a. 11/2016 Ehco: EF 20-25%, glob HK, antsept, ant, apical HK, Gr1 DD, mild MR, mildly dil LA, nl RV fxn; b. 05/2017 Echo: Ef 20-25%, Gr2 DD, prominent apical trabeculations.  . Diabetes mellitus ([place])   . Family history of adverse reaction to anesthesia    mom had a hard  time waking up  . GERD (gastroesophageal reflux disease)   . NICM (nonischemic cardiomyopathy) ([place])    a. 11/2016 Cath: nl cors, EF 25-30%; b. 11/2016 Echo: EF 20-25%; c. 05/2017 Echo: EF 20-25%.  . Slipped intervertebral disc    L4 L5  . STEMI (ST elevation myocardial infarction) ([place])    a. 11/2016 ST elevation -->Nl cors on cath.   Past Surgical History:  Procedure Laterality Date  . BIV ICD INSERTION CRT-D  06/26/2017  . BIV ICD INSERTION CRT-D N/A 06/26/2017   Procedure: BIV ICD INSERTION CRT-D;  Surgeon: [name], MD;  Location: [place] CV LAB;  Service: Cardiovascular;  Laterality: N/A;  . CERVICAL DISC ARTHROPLASTY N/A 07/24/2017   Procedure: CERVICAL ANTERIOR [place] ARTHROPLASTY C5-C7;  Surgeon: [name], MD;  Location: [facility] ORS;  Service: Neurosurgery;  Laterality: N/A;  . COLONOSCOPY WITH PROPOFOL N/A 12/03/2016   Procedure: COLONOSCOPY WITH PROPOFOL;  Surgeon: [name], MD;  Location: [facility] ENDOSCOPY;  Service: Endoscopy;  Laterality: N/A;  . ESOPHAGOGASTRODUODENOSCOPY (EGD) WITH PROPOFOL N/A 12/03/2016   Procedure: ESOPHAGOGASTRODUODENOSCOPY (EGD) WITH PROPOFOL;  Surgeon: [name], MD;  Location: [facility] ENDOSCOPY;  Service: Endoscopy;  Laterality: N/A;  . FINGER SURGERY Right 2012   index finger  . GASTRIC BYPASS    . GIVENS CAPSULE STUDY N/A 01/07/2017   Procedure: GIVENS CAPSULE STUDY;  Surgeon: [name], MD;  Location: [facility] ENDOSCOPY;  Service: Endoscopy;  Laterality: N/A;  . LEFT HEART CATH AND CORONARY ANGIOGRAPHY N/A 12/04/2016   Procedure: Left Heart Cath and Coronary Angiography;  Surgeon: [name], MD;  Location: [place] [facility] LAB;  Service: Cardiovascular;  Laterality: N/A;     Current Outpatient Medications  Medication Sig Dispense Refill  . calcium carbonate (OS-CAL) 600 MG TABS tablet Take 1,200 mg by mouth daily with breakfast.     . carvedilol (COREG) 12.5 MG tablet Take 1 tablet (12.5 mg total) by mouth 2 (two) times daily. 60  tablet 3  . glucose blood (ONE TOUCH ULTRA TEST) test strip Check blood sugar once daily and as instructed. Dx E11.9 100 each 1  . Insulin Glargine (TOUJEO SOLOSTAR) 300 UNIT/ML SOPN Inject 15 Units into the skin every evening.     . metFORMIN (GLUCOPHAGE) 500 MG tablet Take 1,000 mg by mouth 2 (two) times daily with a meal.    . methocarbamol (ROBAXIN) 500 MG tablet Take 1 tablet (500 mg total) by mouth every 6 (six) hours as needed for muscle spasms. (Patient taking differently: Take 500 mg by mouth at bedtime as needed for muscle spasms (takes methocarbamal and oxycodone together at bedtime). ) 90 tablet 0  . Multiple Vitamins-Minerals (ADULT GUMMY PO) Take 2 tablets by mouth daily.    . naproxen sodium (ALEVE) 220 MG tablet Take 440 mg by mouth daily as needed (pain).    . Omega-3 Fatty Acids (FISH OIL PO) Take 800 mg by mouth daily.    Marland Kitchen omeprazole (PRILOSEC) 40 MG capsule Take 40 mg by mouth daily as needed (indigestion).     Marland Kitchen oxyCODONE (OXY IR/ROXICODONE) 5 MG immediate release tablet Take 1 tablet (5 mg total) by mouth every 3 (three) hours as needed for moderate pain ((score 4 to 6)). (Patient taking differently: Take 5 mg by mouth at bedtime as needed for moderate pain ((score 4 to 6) takes methocarbamal and oxycodone together at bedtime). ) 30 tablet 0  . sacubitril-valsartan (ENTRESTO) 49-51 MG Take 1 tablet by mouth 2 (two) times daily. 60 tablet 5   No current facility-administered medications for this visit.     Allergies:   Mobic [meloxicam] and Diclofenac   Social History:  The patient  reports that  has never smoked. he has never used smokeless tobacco. He reports that he does not drink alcohol or use drugs.   Family History:  The patient's family history includes Cancer in his father, maternal grandfather, and mother; Diabetes in his father and mother; Hypertension in his father and mother; Stroke in his maternal grandfather.   ROS:  Please see the history of present illness.    Otherwise, review of systems is positive for DOE, balance problems.   All other systems are reviewed and negative.   PHYSICAL EXAM: VS:  BP (!) 130/98   Pulse 70   Ht 6\' 1"  (1.854 m)   Wt 189 lb (85.7 kg)   SpO2 99%   BMI 24.94 kg/m  , BMI Body mass index is 24.94 kg/m. GEN: Well nourished, well developed, in no acute distress  HEENT: normal  Neck: no JVD, carotid bruits, or masses Cardiac: RRR; no murmurs, rubs, or gallops,no edema  Respiratory:  clear to auscultation bilaterally, normal work of breathing GI: soft, nontender, nondistended, + BS MS: no deformity or atrophy  Skin: warm and dry, device site well healed Neuro:  Strength and sensation are intact Psych: euthymic mood, full affect  EKG:  EKG is not ordered today. Personal review of the ekg ordered 09/27/17 shows sinus rhythm, V pacing  Personal review of the device interrogation today. Results in [place]  Recent Labs: 11/30/2016: TSH 1.019 12/06/2016: Magnesium 1.9 09/27/2017: BUN 13; Creatinine, Ser 0.96; Potassium 3.9; Sodium 139 09/30/2017: Hemoglobin 12.5; Platelets 167    Lipid Panel     Component Value Date/Time   CHOL 121 10/07/2015 0845   TRIG 78.0 10/07/2015 0845   HDL 66.70 10/07/2015 0845   CHOLHDL 2 10/07/2015 0845   VLDL 15.6 10/07/2015 0845   LDLCALC 39 10/07/2015 0845     Wt Readings from Last 3 Encounters:  10/01/17 189 lb (85.7 kg)  09/27/17 189 lb (85.7 kg)  09/10/17 189 lb 2 oz (85.8 kg)      Other studies Reviewed: Additional studies/ records that were reviewed today include: TTE 05/28/17  Review of the above records today demonstrates:  - Left ventricle: The cavity size was moderately dilated. Wall   thickness was normal. Systolic function was severely reduced. The   estimated ejection fraction was in the range of 20% to 25%.   Diffuse hypokinesis. Features are consistent with a pseudonormal   left ventricular filling pattern, with concomitant abnormal   relaxation and increased  filling pressure (grade 2 diastolic   dysfunction). - Pulmonary arteries: Systolic pressure could not be accurately   estimated.  LHC 12/04/16 1. No angiographically significant coronary artery disease. 2. Moderate to severe LV contractile dysfunction (LVEF 30-35%), consistent with non-ischemic cardiomyopathy. 3. Mildly elevated left ventricular filling pressure.   ASSESSMENT AND PLAN:  1.  Systolic heart failure due to nonischemic cardiomyopathy: Currently on optimal medical therapy.  Had a BiV ICD implanted 06/26/17.  Device functioning appropriately.  No signs of volume overload.  Switched his LV lead to 2-4  2. Essential hypertension: Well-controlled.  No changes.  3.  Preoperative evaluation: Currently does not have signs of volume overload.  He is not short of breath at rest.  He has no chest pain.  He would be at intermediate risk for intermediate risk procedure.  We Finch Costanzo plan to continue all of his heart failure medications as possible per surgical recommendations.  No further cardiac testing is needed.    Current medicines are reviewed at length with the patient today.   The patient does not have concerns regarding his medicines.  The following changes were made today: None  Labs/ tests ordered today include:  No orders of the defined types were placed in this encounter.    Disposition:   FU with Tavaughn Silguero 9 months  Signed, Victoriya Pol Meredith Leeds, MD  10/01/2017 11:46 AM     [facility] [address] [place] [address] [phone] (office) [phone] (fax)

## 2017-10-02 ENCOUNTER — Telehealth: Payer: Self-pay | Admitting: Internal Medicine

## 2017-10-02 NOTE — Telephone Encounter (Signed)
Just faxed form, they will need to call industry to program device with tachy therapies disabled and check device after procedure.

## 2017-10-02 NOTE — Telephone Encounter (Signed)
Anesthesia called and said that she would need our device clinic to fill out the pacemaker ICD forms Perioperative Prescription for Implanted Cardiac Device for a surgery on 1/11

## 2017-10-02 NOTE — Telephone Encounter (Signed)
No we do not have any blank forms, the clearance forms are faxed from the hospitals

## 2017-10-02 NOTE — Telephone Encounter (Signed)
Called [facility] main line [phone] and was eventually transferred to same day surgery.  I spoke with Pam and advised her that we had not received a PPM/ ICD form on this patient and they will need to send this to [place]. She took the patient's information and I gave her the [facility] fax #, [phone], to send the form to.  She will call me back with any questions.  This is a Dr. Curt Bears patient.   Will forward to [facility] to look out for the form.

## 2017-10-02 NOTE — Telephone Encounter (Signed)
Do y'all keep blank anesthesia forms? I didn't think you did.

## 2017-10-03 LAB — CUP PACEART INCLINIC DEVICE CHECK
Battery Remaining Longevity: 108 mo
Battery Voltage: 3.05 V
Brady Statistic AP VP Percent: 0.03 %
Brady Statistic RA Percent Paced: 0.04 %
Brady Statistic RV Percent Paced: 37.93 %
Date Time Interrogation Session: 20190108173051
HIGH POWER IMPEDANCE MEASURED VALUE: 45 Ohm
Implantable Lead Implant Date: 20181003
Implantable Lead Location: 753859
Implantable Lead Model: 4398
Implantable Pulse Generator Implant Date: 20181003
Lead Channel Impedance Value: 159.265
Lead Channel Impedance Value: 193.707
Lead Channel Impedance Value: 193.707
Lead Channel Impedance Value: 304 Ohm
Lead Channel Impedance Value: 418 Ohm
Lead Channel Impedance Value: 456 Ohm
Lead Channel Impedance Value: 589 Ohm
Lead Channel Impedance Value: 646 Ohm
Lead Channel Impedance Value: 665 Ohm
Lead Channel Pacing Threshold Amplitude: 0.625 V
Lead Channel Pacing Threshold Pulse Width: 0.4 ms
Lead Channel Pacing Threshold Pulse Width: 0.4 ms
Lead Channel Sensing Intrinsic Amplitude: 2 mV
Lead Channel Setting Pacing Amplitude: 2.5 V
Lead Channel Setting Pacing Amplitude: 2.5 V
Lead Channel Setting Sensing Sensitivity: 0.3 mV
MDC IDC LEAD IMPLANT DT: 20181003
MDC IDC LEAD IMPLANT DT: 20181003
MDC IDC LEAD LOCATION: 753858
MDC IDC LEAD LOCATION: 753860
MDC IDC MSMT LEADCHNL LV IMPEDANCE VALUE: 169.459
MDC IDC MSMT LEADCHNL LV IMPEDANCE VALUE: 169.459
MDC IDC MSMT LEADCHNL LV IMPEDANCE VALUE: 285 Ohm
MDC IDC MSMT LEADCHNL LV IMPEDANCE VALUE: 361 Ohm
MDC IDC MSMT LEADCHNL LV IMPEDANCE VALUE: 418 Ohm
MDC IDC MSMT LEADCHNL LV IMPEDANCE VALUE: 532 Ohm
MDC IDC MSMT LEADCHNL LV IMPEDANCE VALUE: 646 Ohm
MDC IDC MSMT LEADCHNL LV IMPEDANCE VALUE: 646 Ohm
MDC IDC MSMT LEADCHNL LV PACING THRESHOLD AMPLITUDE: 0.875 V
MDC IDC MSMT LEADCHNL LV PACING THRESHOLD PULSEWIDTH: 0.4 ms
MDC IDC MSMT LEADCHNL RA SENSING INTR AMPL: 1.875 mV
MDC IDC MSMT LEADCHNL RV IMPEDANCE VALUE: 361 Ohm
MDC IDC MSMT LEADCHNL RV PACING THRESHOLD AMPLITUDE: 0.875 V
MDC IDC MSMT LEADCHNL RV SENSING INTR AMPL: 9.125 mV
MDC IDC MSMT LEADCHNL RV SENSING INTR AMPL: 9.625 mV
MDC IDC SET LEADCHNL LV PACING AMPLITUDE: 1.5 V
MDC IDC SET LEADCHNL LV PACING PULSEWIDTH: 0.4 ms
MDC IDC SET LEADCHNL RV PACING PULSEWIDTH: 0.4 ms
MDC IDC STAT BRADY AP VS PERCENT: 0.01 %
MDC IDC STAT BRADY AS VP PERCENT: 98.74 %
MDC IDC STAT BRADY AS VS PERCENT: 1.22 %

## 2017-10-03 MED ORDER — CEFAZOLIN SODIUM-DEXTROSE 2-4 GM/100ML-% IV SOLN
2.0000 g | Freq: Once | INTRAVENOUS | Status: AC
  Administered 2017-10-04 (×2): 2 g via INTRAVENOUS

## 2017-10-04 ENCOUNTER — Encounter (HOSPITAL_COMMUNITY): Payer: Self-pay | Admitting: Anesthesiology

## 2017-10-04 ENCOUNTER — Encounter: Payer: Self-pay | Admitting: Anesthesiology

## 2017-10-04 ENCOUNTER — Observation Stay
Admission: RE | Admit: 2017-10-04 | Discharge: 2017-10-05 | Disposition: A | Discharge status: Home / Self Care | Payer: 59 | Source: Ambulatory Visit | Attending: Specialist | Admitting: Specialist

## 2017-10-04 ENCOUNTER — Encounter
Admission: RE | Disposition: A | Discharge status: Home / Self Care | Payer: Self-pay | Source: Ambulatory Visit | Attending: Specialist

## 2017-10-04 ENCOUNTER — Ambulatory Visit: Payer: 59 | Admitting: Anesthesiology

## 2017-10-04 ENCOUNTER — Other Ambulatory Visit: Payer: Self-pay

## 2017-10-04 DIAGNOSIS — M7522 Bicipital tendinitis, left shoulder: Secondary | ICD-10-CM | POA: Diagnosis not present

## 2017-10-04 DIAGNOSIS — M1990 Unspecified osteoarthritis, unspecified site: Secondary | ICD-10-CM | POA: Insufficient documentation

## 2017-10-04 DIAGNOSIS — G4733 Obstructive sleep apnea (adult) (pediatric): Secondary | ICD-10-CM | POA: Diagnosis not present

## 2017-10-04 DIAGNOSIS — M75102 Unspecified rotator cuff tear or rupture of left shoulder, not specified as traumatic: Secondary | ICD-10-CM | POA: Diagnosis present

## 2017-10-04 DIAGNOSIS — D509 Iron deficiency anemia, unspecified: Secondary | ICD-10-CM | POA: Diagnosis not present

## 2017-10-04 DIAGNOSIS — I110 Hypertensive heart disease with heart failure: Secondary | ICD-10-CM | POA: Insufficient documentation

## 2017-10-04 DIAGNOSIS — E109 Type 1 diabetes mellitus without complications: Secondary | ICD-10-CM | POA: Diagnosis not present

## 2017-10-04 DIAGNOSIS — Z95810 Presence of automatic (implantable) cardiac defibrillator: Secondary | ICD-10-CM | POA: Insufficient documentation

## 2017-10-04 DIAGNOSIS — E7800 Pure hypercholesterolemia, unspecified: Secondary | ICD-10-CM | POA: Diagnosis not present

## 2017-10-04 DIAGNOSIS — M5412 Radiculopathy, cervical region: Secondary | ICD-10-CM | POA: Insufficient documentation

## 2017-10-04 DIAGNOSIS — Z9889 Other specified postprocedural states: Secondary | ICD-10-CM

## 2017-10-04 DIAGNOSIS — G8929 Other chronic pain: Secondary | ICD-10-CM | POA: Insufficient documentation

## 2017-10-04 DIAGNOSIS — I2510 Atherosclerotic heart disease of native coronary artery without angina pectoris: Secondary | ICD-10-CM | POA: Diagnosis not present

## 2017-10-04 DIAGNOSIS — I252 Old myocardial infarction: Secondary | ICD-10-CM | POA: Insufficient documentation

## 2017-10-04 DIAGNOSIS — Z79899 Other long term (current) drug therapy: Secondary | ICD-10-CM | POA: Diagnosis not present

## 2017-10-04 DIAGNOSIS — I5042 Chronic combined systolic (congestive) and diastolic (congestive) heart failure: Secondary | ICD-10-CM | POA: Insufficient documentation

## 2017-10-04 DIAGNOSIS — K219 Gastro-esophageal reflux disease without esophagitis: Secondary | ICD-10-CM | POA: Insufficient documentation

## 2017-10-04 DIAGNOSIS — Z794 Long term (current) use of insulin: Secondary | ICD-10-CM | POA: Insufficient documentation

## 2017-10-04 DIAGNOSIS — I452 Bifascicular block: Secondary | ICD-10-CM | POA: Diagnosis not present

## 2017-10-04 DIAGNOSIS — I429 Cardiomyopathy, unspecified: Secondary | ICD-10-CM | POA: Diagnosis not present

## 2017-10-04 DIAGNOSIS — M75122 Complete rotator cuff tear or rupture of left shoulder, not specified as traumatic: Secondary | ICD-10-CM | POA: Diagnosis not present

## 2017-10-04 DIAGNOSIS — Z7982 Long term (current) use of aspirin: Secondary | ICD-10-CM | POA: Insufficient documentation

## 2017-10-04 DIAGNOSIS — I5022 Chronic systolic (congestive) heart failure: Secondary | ICD-10-CM | POA: Diagnosis not present

## 2017-10-04 HISTORY — PX: SHOULDER ARTHROSCOPY WITH OPEN ROTATOR CUFF REPAIR: SHX6092

## 2017-10-04 LAB — GLUCOSE, CAPILLARY
GLUCOSE-CAPILLARY: 148 mg/dL — AB (ref 65–99)
Glucose-Capillary: 145 mg/dL — ABNORMAL HIGH (ref 65–99)

## 2017-10-04 SURGERY — ARTHROSCOPY, SHOULDER WITH REPAIR, ROTATOR CUFF, OPEN
Anesthesia: General | Site: Shoulder | Laterality: Left | Wound class: Clean

## 2017-10-04 MED ORDER — ACETAMINOPHEN 650 MG RE SUPP: 650.0000 mg | RECTAL | Status: DC | PRN

## 2017-10-04 MED ORDER — FENTANYL CITRATE (PF) 100 MCG/2ML IJ SOLN
INTRAMUSCULAR | Status: AC
  Administered 2017-10-04: 25 ug via INTRAVENOUS
  Filled 2017-10-04: qty 2

## 2017-10-04 MED ORDER — SACUBITRIL-VALSARTAN 49-51 MG PO TABS
1.0000 | ORAL_TABLET | Freq: Two times a day (BID) | ORAL | Status: DC
  Administered 2017-10-04 – 2017-10-05 (×2): 1 via ORAL
  Filled 2017-10-04 (×3): qty 1

## 2017-10-04 MED ORDER — PHENYLEPHRINE 8 MG IN D5W 100 ML (0.08MG/ML) PREMIX OPTIME
INJECTION | INTRAVENOUS | Status: DC | PRN
  Administered 2017-10-04: 20 ug/min via INTRAVENOUS

## 2017-10-04 MED ORDER — METOCLOPRAMIDE HCL 10 MG PO TABS: 5.0000 mg | ORAL_TABLET | Freq: Three times a day (TID) | ORAL | Status: DC | PRN

## 2017-10-04 MED ORDER — HYDROMORPHONE HCL 1 MG/ML IJ SOLN
0.5000 mg | INTRAMUSCULAR | Status: DC | PRN
  Administered 2017-10-04 – 2017-10-05 (×3): 0.5 mg via INTRAVENOUS
  Filled 2017-10-04 (×3): qty 1

## 2017-10-04 MED ORDER — FENTANYL CITRATE (PF) 100 MCG/2ML IJ SOLN
25.0000 ug | INTRAMUSCULAR | Status: AC | PRN
  Administered 2017-10-04 (×6): 25 ug via INTRAVENOUS

## 2017-10-04 MED ORDER — HYDROMORPHONE HCL 1 MG/ML IJ SOLN
0.5000 mg | INTRAMUSCULAR | Status: DC | PRN
  Administered 2017-10-04 (×2): 0.5 mg via INTRAVENOUS

## 2017-10-04 MED ORDER — CARVEDILOL 12.5 MG PO TABS
12.5000 mg | ORAL_TABLET | Freq: Two times a day (BID) | ORAL | Status: DC
  Administered 2017-10-04 – 2017-10-05 (×2): 12.5 mg via ORAL
  Filled 2017-10-04 (×2): qty 1

## 2017-10-04 MED ORDER — FENTANYL CITRATE (PF) 100 MCG/2ML IJ SOLN: 50.0000 ug | INTRAMUSCULAR | Status: DC | PRN

## 2017-10-04 MED ORDER — CEFAZOLIN SODIUM-DEXTROSE 2-4 GM/100ML-% IV SOLN
INTRAVENOUS | Status: AC
  Filled 2017-10-04: qty 100

## 2017-10-04 MED ORDER — PROPOFOL 10 MG/ML IV BOLUS
INTRAVENOUS | Status: DC | PRN
  Administered 2017-10-04: 150 mg via INTRAVENOUS

## 2017-10-04 MED ORDER — PANTOPRAZOLE SODIUM 40 MG PO TBEC
80.0000 mg | DELAYED_RELEASE_TABLET | Freq: Every day | ORAL | Status: DC
  Administered 2017-10-05: 80 mg via ORAL
  Filled 2017-10-04: qty 2

## 2017-10-04 MED ORDER — ROCURONIUM BROMIDE 100 MG/10ML IV SOLN
INTRAVENOUS | Status: DC | PRN
  Administered 2017-10-04 (×4): 10 mg via INTRAVENOUS
  Administered 2017-10-04: 40 mg via INTRAVENOUS
  Administered 2017-10-04: 10 mg via INTRAVENOUS
  Administered 2017-10-04: 20 mg via INTRAVENOUS
  Administered 2017-10-04: 10 mg via INTRAVENOUS

## 2017-10-04 MED ORDER — FAMOTIDINE 20 MG PO TABS
ORAL_TABLET | ORAL | Status: AC
  Administered 2017-10-04: 20 mg
  Filled 2017-10-04: qty 1

## 2017-10-04 MED ORDER — LIDOCAINE HCL (CARDIAC) 20 MG/ML IV SOLN
INTRAVENOUS | Status: DC | PRN
  Administered 2017-10-04: 40 mg via INTRAVENOUS

## 2017-10-04 MED ORDER — ONDANSETRON HCL 4 MG PO TABS: 4.0000 mg | ORAL_TABLET | Freq: Four times a day (QID) | ORAL | Status: DC | PRN

## 2017-10-04 MED ORDER — ACETAMINOPHEN 500 MG PO TABS
1000.0000 mg | ORAL_TABLET | Freq: Three times a day (TID) | ORAL | Status: DC
  Administered 2017-10-04 – 2017-10-05 (×2): 1000 mg via ORAL
  Filled 2017-10-04 (×2): qty 2

## 2017-10-04 MED ORDER — MENTHOL 3 MG MT LOZG
1.0000 | LOZENGE | OROMUCOSAL | Status: DC | PRN
  Filled 2017-10-04: qty 9

## 2017-10-04 MED ORDER — METHOCARBAMOL 1000 MG/10ML IJ SOLN
500.0000 mg | Freq: Four times a day (QID) | INTRAVENOUS | Status: DC | PRN
  Filled 2017-10-04: qty 5

## 2017-10-04 MED ORDER — MIDAZOLAM HCL 2 MG/2ML IJ SOLN
INTRAMUSCULAR | Status: DC | PRN
  Administered 2017-10-04: 2 mg via INTRAVENOUS

## 2017-10-04 MED ORDER — BISACODYL 10 MG RE SUPP: 10.0000 mg | Freq: Every day | RECTAL | Status: DC | PRN

## 2017-10-04 MED ORDER — EPINEPHRINE 30 MG/30ML IJ SOLN
INTRAMUSCULAR | Status: AC
  Filled 2017-10-04: qty 2

## 2017-10-04 MED ORDER — LIDOCAINE HCL (PF) 2 % IJ SOLN
INTRAMUSCULAR | Status: AC
  Filled 2017-10-04: qty 10

## 2017-10-04 MED ORDER — OXYCODONE HCL 5 MG PO TABS
10.0000 mg | ORAL_TABLET | ORAL | Status: DC | PRN
  Administered 2017-10-05 (×2): 10 mg via ORAL
  Filled 2017-10-04 (×2): qty 2

## 2017-10-04 MED ORDER — SODIUM CHLORIDE 0.9 % IV SOLN
INTRAVENOUS | Status: DC
Start: 2017-10-04 — End: 2017-10-05
  Administered 2017-10-04: 19:00:00 via INTRAVENOUS

## 2017-10-04 MED ORDER — ALUM & MAG HYDROXIDE-SIMETH 200-200-20 MG/5ML PO SUSP: 30.0000 mL | ORAL | Status: DC | PRN

## 2017-10-04 MED ORDER — ONDANSETRON HCL 4 MG/2ML IJ SOLN: 4.0000 mg | Freq: Four times a day (QID) | INTRAMUSCULAR | Status: DC | PRN

## 2017-10-04 MED ORDER — PHENOL 1.4 % MT LIQD
1.0000 | OROMUCOSAL | Status: DC | PRN
  Filled 2017-10-04: qty 177

## 2017-10-04 MED ORDER — LACTATED RINGERS IV SOLN: INTRAVENOUS | Status: DC

## 2017-10-04 MED ORDER — ACETAMINOPHEN 325 MG PO TABS: 650.0000 mg | ORAL_TABLET | ORAL | Status: DC | PRN

## 2017-10-04 MED ORDER — DIPHENHYDRAMINE HCL 12.5 MG/5ML PO ELIX: 12.5000 mg | ORAL_SOLUTION | ORAL | Status: DC | PRN

## 2017-10-04 MED ORDER — FENTANYL CITRATE (PF) 100 MCG/2ML IJ SOLN
25.0000 ug | INTRAMUSCULAR | Status: AC | PRN
  Administered 2017-10-04 (×2): 25 ug via INTRAVENOUS

## 2017-10-04 MED ORDER — BUPIVACAINE HCL (PF) 0.5 % IJ SOLN
INTRAMUSCULAR | Status: AC
  Filled 2017-10-04: qty 30

## 2017-10-04 MED ORDER — LIDOCAINE-EPINEPHRINE 1 %-1:100000 IJ SOLN
INTRAMUSCULAR | Status: DC | PRN
  Administered 2017-10-04: 15 mL

## 2017-10-04 MED ORDER — CEFAZOLIN SODIUM-DEXTROSE 2-4 GM/100ML-% IV SOLN
2.0000 g | Freq: Four times a day (QID) | INTRAVENOUS | Status: AC
  Administered 2017-10-04 – 2017-10-05 (×2): 2 g via INTRAVENOUS
  Filled 2017-10-04 (×3): qty 100

## 2017-10-04 MED ORDER — OXYCODONE HCL 5 MG PO TABS
5.0000 mg | ORAL_TABLET | ORAL | Status: DC | PRN
  Filled 2017-10-04: qty 1

## 2017-10-04 MED ORDER — SENNOSIDES-DOCUSATE SODIUM 8.6-50 MG PO TABS
1.0000 | ORAL_TABLET | Freq: Every evening | ORAL | Status: DC | PRN
  Filled 2017-10-04: qty 1

## 2017-10-04 MED ORDER — MIDAZOLAM HCL 2 MG/2ML IJ SOLN
INTRAMUSCULAR | Status: AC
  Filled 2017-10-04: qty 2

## 2017-10-04 MED ORDER — PROPOFOL 10 MG/ML IV BOLUS
INTRAVENOUS | Status: AC
  Filled 2017-10-04: qty 20

## 2017-10-04 MED ORDER — ASPIRIN EC 325 MG PO TBEC
325.0000 mg | DELAYED_RELEASE_TABLET | Freq: Every day | ORAL | Status: DC
  Administered 2017-10-05: 325 mg via ORAL
  Filled 2017-10-04: qty 1

## 2017-10-04 MED ORDER — CALCIUM CARBONATE ANTACID 500 MG PO CHEW
1000.0000 mg | CHEWABLE_TABLET | Freq: Every day | ORAL | Status: DC
  Administered 2017-10-05: 1000 mg via ORAL
  Filled 2017-10-04: qty 5

## 2017-10-04 MED ORDER — SUGAMMADEX SODIUM 200 MG/2ML IV SOLN
INTRAVENOUS | Status: DC | PRN
  Administered 2017-10-04: 200 mg via INTRAVENOUS

## 2017-10-04 MED ORDER — PHENYLEPHRINE HCL 10 MG/ML IJ SOLN
INTRAMUSCULAR | Status: DC | PRN
  Administered 2017-10-04 (×6): 100 ug via INTRAVENOUS

## 2017-10-04 MED ORDER — HYDROMORPHONE HCL 1 MG/ML IJ SOLN
INTRAMUSCULAR | Status: AC
  Administered 2017-10-04: 0.5 mg via INTRAVENOUS
  Filled 2017-10-04: qty 1

## 2017-10-04 MED ORDER — ACETAMINOPHEN 10 MG/ML IV SOLN
INTRAVENOUS | Status: AC
  Filled 2017-10-04: qty 100

## 2017-10-04 MED ORDER — EPINEPHRINE PF 1 MG/ML IJ SOLN
INTRAMUSCULAR | Status: DC | PRN
  Administered 2017-10-04: 24 mL

## 2017-10-04 MED ORDER — ONDANSETRON HCL 4 MG/2ML IJ SOLN: 4.0000 mg | Freq: Once | INTRAMUSCULAR | Status: DC | PRN

## 2017-10-04 MED ORDER — ACETAMINOPHEN 10 MG/ML IV SOLN
INTRAVENOUS | Status: DC | PRN
  Administered 2017-10-04: 1000 mg via INTRAVENOUS

## 2017-10-04 MED ORDER — ROCURONIUM BROMIDE 50 MG/5ML IV SOLN
INTRAVENOUS | Status: AC
  Filled 2017-10-04: qty 1

## 2017-10-04 MED ORDER — SODIUM CHLORIDE 0.9 % IV SOLN
INTRAVENOUS | Status: DC
  Administered 2017-10-04: 10:00:00 via INTRAVENOUS

## 2017-10-04 MED ORDER — SUCCINYLCHOLINE CHLORIDE 20 MG/ML IJ SOLN
INTRAMUSCULAR | Status: AC
  Filled 2017-10-04: qty 1

## 2017-10-04 MED ORDER — LIDOCAINE-EPINEPHRINE 1 %-1:100000 IJ SOLN
INTRAMUSCULAR | Status: AC
  Filled 2017-10-04: qty 1

## 2017-10-04 MED ORDER — FENTANYL CITRATE (PF) 100 MCG/2ML IJ SOLN
INTRAMUSCULAR | Status: AC
  Filled 2017-10-04: qty 2

## 2017-10-04 MED ORDER — METHOCARBAMOL 500 MG PO TABS
500.0000 mg | ORAL_TABLET | Freq: Four times a day (QID) | ORAL | Status: DC | PRN
Start: 2017-10-04 — End: 2017-10-05
  Administered 2017-10-05: 500 mg via ORAL
  Filled 2017-10-04 (×2): qty 1

## 2017-10-04 MED ORDER — DOCUSATE SODIUM 100 MG PO CAPS
100.0000 mg | ORAL_CAPSULE | Freq: Two times a day (BID) | ORAL | Status: DC
  Administered 2017-10-04 – 2017-10-05 (×2): 100 mg via ORAL
  Filled 2017-10-04 (×2): qty 1

## 2017-10-04 MED ORDER — METOCLOPRAMIDE HCL 5 MG/ML IJ SOLN: 5.0000 mg | Freq: Three times a day (TID) | INTRAMUSCULAR | Status: DC | PRN

## 2017-10-04 MED ORDER — FENTANYL CITRATE (PF) 250 MCG/5ML IJ SOLN
INTRAMUSCULAR | Status: AC
  Filled 2017-10-04: qty 5

## 2017-10-04 MED ORDER — FENTANYL CITRATE (PF) 100 MCG/2ML IJ SOLN
INTRAMUSCULAR | Status: DC | PRN
  Administered 2017-10-04: 50 ug via INTRAVENOUS
  Administered 2017-10-04 (×2): 25 ug via INTRAVENOUS
  Administered 2017-10-04: 50 ug via INTRAVENOUS
  Administered 2017-10-04 (×2): 25 ug via INTRAVENOUS
  Administered 2017-10-04 (×2): 50 ug via INTRAVENOUS

## 2017-10-04 SURGICAL SUPPLY — 90 items
ADAPTER IRRIG TUBE 2 SPIKE SOL (ADAPTER) ×4 IMPLANT
ANCHOR 2.3 SP SGL 1.2 XBRAID (Anchor) ×4 IMPLANT
ANCHOR SUT BIO SW 4.75X19.1 (Anchor) ×2 IMPLANT
ANCHOR SUT FBRTK SUTURETAP 1.3 (Anchor) ×2 IMPLANT
ANCHOR SWIVELOCK BIO COMP (Anchor) ×4 IMPLANT
BIT DRILL RIGD1.8MM FBRTK STRL (DRILL) ×1 IMPLANT
BLADE OSCILLATING/SAGITTAL (BLADE)
BLADE SW THK.38XMED LNG THN (BLADE) IMPLANT
BRUSH SCRUB EZ  4% CHG (MISCELLANEOUS)
BRUSH SCRUB EZ 4% CHG (MISCELLANEOUS) IMPLANT
BUR BR 5.5 12 FLUTE (BURR) ×2 IMPLANT
BUR RADIUS 4.0X18.5 (BURR) ×2 IMPLANT
BUR RADIUS 5.5 (BURR) ×2 IMPLANT
CANNULA 5.75X7 CRYSTAL CLEAR (CANNULA) ×2 IMPLANT
CANNULA PARTIAL THREAD 2X7 (CANNULA) ×2 IMPLANT
CANNULA TWIST IN 8.25X9CM (CANNULA) IMPLANT
CHLORAPREP W/TINT 26ML (MISCELLANEOUS) ×2 IMPLANT
COOLER POLAR GLACIER W/PUMP (MISCELLANEOUS) ×2 IMPLANT
COVER LIGHT HANDLE STERIS (MISCELLANEOUS) ×2 IMPLANT
CRADLE LAMINECT ARM (MISCELLANEOUS) ×2 IMPLANT
DERMABOND ADVANCED (GAUZE/BANDAGES/DRESSINGS) ×1
DERMABOND ADVANCED .7 DNX12 (GAUZE/BANDAGES/DRESSINGS) ×1 IMPLANT
DRAPE IMP U-DRAPE 54X76 (DRAPES) ×4 IMPLANT
DRAPE INCISE IOBAN 66X45 STRL (DRAPES) ×2 IMPLANT
DRAPE POUCH INSTRU U-SHP 10X18 (DRAPES) ×2 IMPLANT
DRAPE SHEET LG 3/4 BI-LAMINATE (DRAPES) ×2 IMPLANT
DRAPE STERI 35X30 U-POUCH (DRAPES) ×2 IMPLANT
DRAPE U-SHAPE 47X51 STRL (DRAPES) ×4 IMPLANT
DRILL RIGID 1.8MM FBRTK STRL (DRILL) ×2
ELECT REM PT RETURN 9FT ADLT (ELECTROSURGICAL) ×2
ELECTRODE REM PT RTRN 9FT ADLT (ELECTROSURGICAL) ×1 IMPLANT
GAUZE PETRO XEROFOAM 1X8 (MISCELLANEOUS) IMPLANT
GAUZE SPONGE 4X4 12PLY STRL (GAUZE/BANDAGES/DRESSINGS) IMPLANT
GAUZE SPONGE NON-WVN 2X2 STRL (MISCELLANEOUS) ×1 IMPLANT
GLOVE BIOGEL PI IND STRL 8 (GLOVE) ×1 IMPLANT
GLOVE BIOGEL PI INDICATOR 8 (GLOVE) ×1
GLOVE SURG SYN 7.5  E (GLOVE) ×1
GLOVE SURG SYN 7.5 E (GLOVE) ×1 IMPLANT
GOWN STRL REUS W/ TWL LRG LVL3 (GOWN DISPOSABLE) ×1 IMPLANT
GOWN STRL REUS W/TWL LRG LVL3 (GOWN DISPOSABLE) ×1
GOWN STRL REUS W/TWL LRG LVL4 (GOWN DISPOSABLE) ×2 IMPLANT
IV LACTATED RINGER IRRG 3000ML (IV SOLUTION) ×24
IV LR IRRIG 3000ML ARTHROMATIC (IV SOLUTION) ×24 IMPLANT
KIT RM TURNOVER STRD PROC AR (KITS) ×2 IMPLANT
KIT SPEAR STR 1.6MM DRILL (MISCELLANEOUS) ×2 IMPLANT
KIT STABILIZATION SHOULDER (MISCELLANEOUS) ×2 IMPLANT
KIT SUTURETAK 3.0 INSERT PERC (KITS) IMPLANT
MANIFOLD NEPTUNE II (INSTRUMENTS) ×2 IMPLANT
MASK FACE SPIDER DISP (MASK) ×2 IMPLANT
MAT ABSORB  FLUID 56X50 GRAY (MISCELLANEOUS) ×1
MAT ABSORB FLUID 56X50 GRAY (MISCELLANEOUS) ×1 IMPLANT
NDL SAFETY ECLIPSE 18X1.5 (NEEDLE) ×1 IMPLANT
NEEDLE HYPO 18GX1.5 SHARP (NEEDLE) ×1
NEEDLE HYPO 22GX1.5 SAFETY (NEEDLE) ×2 IMPLANT
NEEDLE MAYO 6 CRC TAPER PT (NEEDLE) IMPLANT
NEEDLE SCORPION MULTI FIRE (NEEDLE) ×2 IMPLANT
PACK ARTHROSCOPY SHOULDER (MISCELLANEOUS) ×2 IMPLANT
PAD ABD DERMACEA PRESS 5X9 (GAUZE/BANDAGES/DRESSINGS) IMPLANT
PAD WRAPON POLAR SHDR XLG (MISCELLANEOUS) ×1 IMPLANT
SET TUBE SUCT SHAVER OUTFL 24K (TUBING) ×2 IMPLANT
SET TUBE TIP INTRA-ARTICULAR (MISCELLANEOUS) ×2 IMPLANT
SLING ULTRA II M (MISCELLANEOUS) ×2 IMPLANT
SPONGE VERSALON 2X2 STRL (MISCELLANEOUS) ×1
STAPLER SKIN PROX 35W (STAPLE) IMPLANT
STRAP SAFETY BODY (MISCELLANEOUS) ×2 IMPLANT
STRIP CLOSURE SKIN 1/2X4 (GAUZE/BANDAGES/DRESSINGS) IMPLANT
SUT ETHILON 3-0 (SUTURE) IMPLANT
SUT ETHILON 3-0 FS-10 30 BLK (SUTURE) ×2
SUT ETHILON 4-0 (SUTURE) ×1
SUT ETHILON 4-0 FS2 18XMFL BLK (SUTURE) ×1
SUT LASSO 90 DEG SD STR (SUTURE) ×2 IMPLANT
SUT MNCRL 4-0 (SUTURE) ×1
SUT MNCRL 4-0 27XMFL (SUTURE) ×1
SUT PROLENE 0 CT 2 (SUTURE) IMPLANT
SUT PROLENE 6 0 P 1 18 (SUTURE) IMPLANT
SUT TICRON 2-0 30IN 311381 (SUTURE) IMPLANT
SUT VIC AB 0 CT1 36 (SUTURE) ×2 IMPLANT
SUT VIC AB 2-0 CT2 27 (SUTURE) ×2 IMPLANT
SUT VICRYL 3-0 27IN (SUTURE) ×2 IMPLANT
SUTURE EHLN 3-0 FS-10 30 BLK (SUTURE) ×1 IMPLANT
SUTURE ETHLN 4-0 FS2 18XMF BLK (SUTURE) ×1 IMPLANT
SUTURE MNCRL 4-0 27XMF (SUTURE) ×1 IMPLANT
SYR 10ML LL (SYRINGE) ×2 IMPLANT
TAPE CLOTH 3X10 WHT NS LF (GAUZE/BANDAGES/DRESSINGS) ×2 IMPLANT
TAPE FIBER 2MM 7IN #2 BLUE (SUTURE) ×2 IMPLANT
TAPE MICROFOAM 4IN (TAPE) IMPLANT
TUBING ARTHRO INFLOW-ONLY STRL (TUBING) ×2 IMPLANT
TUBING CONNECTING 10 (TUBING) ×2 IMPLANT
WAND HAND CNTRL MULTIVAC 90 (MISCELLANEOUS) IMPLANT
WRAPON POLAR PAD SHDR XLG (MISCELLANEOUS) ×2

## 2017-10-04 NOTE — H&P (Signed)
Paper H&P to be scanned into permanent record. H&P reviewed. No significant changes noted.  

## 2017-10-04 NOTE — Anesthesia Procedure Notes (Signed)
Procedure Name: Intubation Date/Time: 10/04/2017 11:20 AM Performed by: Allean Found, CRNA Pre-anesthesia Checklist: Patient identified, Emergency Drugs available, Suction available, Patient being monitored and Timeout performed Patient Re-evaluated:Patient Re-evaluated prior to induction Oxygen Delivery Method: Circle system utilized Preoxygenation: Pre-oxygenation with 100% oxygen Induction Type: IV induction Ventilation: Mask ventilation without difficulty Laryngoscope Size: Mac and 4 Grade View: Grade III Tube type: Oral Tube size: 7.5 mm Airway Equipment and Method: Stylet Secured at: 22 cm Tube secured with: Tape Dental Injury: Teeth and Oropharynx as per pre-operative assessment  Difficulty Due To: Difficult Airway- due to anterior larynx

## 2017-10-04 NOTE — Anesthesia Post-op Follow-up Note (Signed)
Anesthesia QCDR form completed.        

## 2017-10-04 NOTE — Consult Note (Addendum)
[place] [facility] [place] NAME: Philip Ponce    MR#:  [id number]  DATE OF BIRTH:  1971-08-24  DATE OF ADMISSION:  10/04/2017  PRIMARY CARE PHYSICIAN: [name], MD   REQUESTING/REFERRING PHYSICIAN: Patel,sunny  CHIEF COMPLAINT:  No chief complaint on file.   HISTORY OF PRESENT ILLNESS: Philip Ponce  is a 47 y.o. male with a known history of Anemia, CHF- EF 25%, s/p AICD, MI, DM, Htn, was brought in for rotator cuff shoulder surgery by Dr. Posey Pronto. Admitting post -op for monitoring and medical consult for medical management. Seen pt is post op- recovery, he is without any complains. He did not receive general anesthesia. He had received 100 ml fluids during surgery. Vital stable.  PAST MEDICAL HISTORY:   Past Medical History:  Diagnosis Date  . Acute blood loss anemia 11/20/2016  . 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 Chronic combined systolic (congestive) and diastolic (congestive) heart failure ([place])    a. 11/2016 Ehco: EF 20-25%, glob HK, antsept, ant, apical HK, Gr1 DD, mild MR, mildly dil LA, nl RV fxn; b. 05/2017 Echo: Ef 20-25%, Gr2 DD, prominent apical trabeculations.  . Diabetes mellitus ([place])   . Family history of adverse reaction to anesthesia    mom had a hard time waking up  . GERD (gastroesophageal reflux disease)   . NICM (nonischemic cardiomyopathy) ([place])    a. 11/2016 Cath: nl cors, EF 25-30%; b. 11/2016 Echo: EF 20-25%; c. 05/2017 Echo: EF 20-25%.  . Slipped intervertebral disc    L4 L5  . STEMI (ST elevation myocardial infarction) ([place])    a. 11/2016 ST elevation -->Nl cors on cath.    PAST SURGICAL HISTORY:  Past Surgical History:  Procedure Laterality Date  . BIV ICD INSERTION CRT-D  06/26/2017  . BIV ICD INSERTION CRT-D N/A 06/26/2017   Procedure: BIV ICD INSERTION CRT-D;  Surgeon: [name], MD;  Location: [place] CV LAB;  Service: Cardiovascular;  Laterality:  N/A;  . CERVICAL DISC ARTHROPLASTY N/A 07/24/2017   Procedure: CERVICAL ANTERIOR [place] ARTHROPLASTY C5-C7;  Surgeon: [name], MD;  Location: [facility] ORS;  Service: Neurosurgery;  Laterality: N/A;  . COLONOSCOPY WITH PROPOFOL N/A 12/03/2016   Procedure: COLONOSCOPY WITH PROPOFOL;  Surgeon: [name], MD;  Location: [facility] ENDOSCOPY;  Service: Endoscopy;  Laterality: N/A;  . ESOPHAGOGASTRODUODENOSCOPY (EGD) WITH PROPOFOL N/A 12/03/2016   Procedure: ESOPHAGOGASTRODUODENOSCOPY (EGD) WITH PROPOFOL;  Surgeon: [name], MD;  Location: [facility] ENDOSCOPY;  Service: Endoscopy;  Laterality: N/A;  . FINGER SURGERY Right 2012   index finger  . GASTRIC BYPASS    . GIVENS CAPSULE STUDY N/A 01/07/2017   Procedure: GIVENS CAPSULE STUDY;  Surgeon: [name], MD;  Location: [facility] ENDOSCOPY;  Service: Endoscopy;  Laterality: N/A;  . LEFT HEART CATH AND CORONARY ANGIOGRAPHY N/A 12/04/2016   Procedure: Left Heart Cath and Coronary Angiography;  Surgeon: [name], MD;  Location: [place] [facility] LAB;  Service: Cardiovascular;  Laterality: N/A;    SOCIAL HISTORY:  Social History   Tobacco Use  . Smoking status: Never Smoker  . Smokeless tobacco: Never Used  Substance Use Topics  . Alcohol use: No    FAMILY HISTORY:  Family History  Problem Relation Age of Onset  . Diabetes Mother   . Hypertension Mother   . Cancer Mother        breast  . Diabetes Father   . Hypertension Father   . Cancer Father  colon  . Cancer Maternal Grandfather        prostate  . Stroke Maternal Grandfather        CVA    DRUG ALLERGIES:  Allergies  Allergen Reactions  . Mobic [Meloxicam] Other (See Comments)    Ulcers in stomach eruption   . Diclofenac Palpitations and Other (See Comments)    Fast heart beat, CP,SOB and weakness on one side.    REVIEW OF SYSTEMS:   CONSTITUTIONAL: No fever, fatigue or weakness.  EYES: No blurred or double vision.  EARS, NOSE, AND THROAT: No tinnitus or ear pain.   RESPIRATORY: No cough, shortness of breath, wheezing or hemoptysis.  CARDIOVASCULAR: No chest pain, orthopnea, edema.  GASTROINTESTINAL: No nausea, vomiting, diarrhea or abdominal pain.  GENITOURINARY: No dysuria, hematuria.  ENDOCRINE: No polyuria, nocturia,  HEMATOLOGY: No anemia, easy bruising or bleeding SKIN: No rash or lesion. MUSCULOSKELETAL: No joint pain or arthritis.   NEUROLOGIC: No tingling, numbness, weakness.  PSYCHIATRY: No anxiety or depression.   MEDICATIONS AT HOME:  Prior to Admission medications   Medication Sig Start Date End Date Taking? Authorizing Provider  calcium carbonate (OS-CAL) 600 MG TABS tablet Take 1,200 mg by mouth daily with breakfast.    Yes [provider]  carvedilol (COREG) 12.5 MG tablet Take 1 tablet (12.5 mg total) by mouth 2 (two) times daily. 09/10/17 12/09/17 Yes Hackney, Tina A, FNP  glucose blood (ONE TOUCH ULTRA TEST) test strip Check blood sugar once daily and as instructed. Dx E11.9 01/19/16  Yes Bedsole, Amy E, MD  Insulin Glargine (TOUJEO SOLOSTAR) 300 UNIT/ML SOPN Inject 15 Units into the skin every evening.    Yes [provider]  metFORMIN (GLUCOPHAGE) 500 MG tablet Take 1,000 mg by mouth 2 (two) times daily with a meal.   Yes [provider]  methocarbamol (ROBAXIN) 500 MG tablet Take 1 tablet (500 mg total) by mouth every 6 (six) hours as needed for muscle spasms. Patient taking differently: Take 500 mg by mouth at bedtime as needed for muscle spasms (takes methocarbamal and oxycodone together at bedtime).  07/25/17  Yes Marin Olp, PA-C  Multiple Vitamins-Minerals (ADULT GUMMY PO) Take 2 tablets by mouth daily.   Yes [provider]  naproxen sodium (ALEVE) 220 MG tablet Take 440 mg by mouth daily as needed (pain).   Yes [provider]  Omega-3 Fatty Acids (FISH OIL PO) Take 800 mg by mouth daily.   Yes [provider]  omeprazole (PRILOSEC) 40 MG capsule Take 40 mg by mouth  daily as needed (indigestion).    Yes [provider]  oxyCODONE (OXY IR/ROXICODONE) 5 MG immediate release tablet Take 1 tablet (5 mg total) by mouth every 3 (three) hours as needed for moderate pain ((score 4 to 6)). Patient taking differently: Take 5 mg by mouth at bedtime as needed for moderate pain ((score 4 to 6) takes methocarbamal and oxycodone together at bedtime).  07/25/17  Yes Marin Olp, PA-C  sacubitril-valsartan (ENTRESTO) 49-51 MG Take 1 tablet by mouth 2 (two) times daily. 09/22/17  Yes Hackney, Otila Kluver A, FNP      PHYSICAL EXAMINATION:   VITAL SIGNS: Blood pressure (!) 146/96, pulse 83, temperature 97.6 F (36.4 C), resp. rate 12, SpO2 100 %.  GENERAL:  47 y.o.-year-old patient lying in the bed with no acute distress.  EYES: Pupils equal, round, reactive to light and accommodation. No scleral icterus. Extraocular muscles intact.  HEENT: Head atraumatic, normocephalic. Oropharynx and nasopharynx clear.  NECK:  Supple, no jugular venous distention. No thyroid enlargement, no tenderness.  LUNGS: Normal breath sounds bilaterally, no wheezing, rales,rhonchi or crepitation. No use of accessory muscles of respiration.  CARDIOVASCULAR: S1, S2 normal. No murmurs, rubs, or gallops. AICD ABDOMEN: Soft, nontender, nondistended. Bowel sounds present. No organomegaly or mass.  EXTREMITIES: No pedal edema, cyanosis, or clubbing. Left shoulder in post op dressing and cooling bandage. NEUROLOGIC: Cranial nerves II through XII are intact. Muscle strength 5/5 in all extremities. Sensation intact. Gait not checked.  PSYCHIATRIC: The patient is alert and oriented x 3.  SKIN: No obvious rash, lesion, or ulcer.   LABORATORY PANEL:   CBC Recent Labs  Lab 09/30/17 0900  WBC 5.3  HGB 12.5*  HCT 38.6*  PLT 167  MCV 88.4  MCH 28.6  MCHC 32.4  RDW 13.2  LYMPHSABS 1.2  MONOABS 0.4  EOSABS 0.1  BASOSABS 0.0    ------------------------------------------------------------------------------------------------------------------  Chemistries  No results for input(s): NA, K, CL, CO2, GLUCOSE, BUN, CREATININE, CALCIUM, MG, AST, ALT, ALKPHOS, BILITOT in the last 168 hours.  Invalid input(s): GFRCGP ------------------------------------------------------------------------------------------------------------------ estimated creatinine clearance is 106.3 mL/min (by C-G formula based on SCr of 0.96 mg/dL). ------------------------------------------------------------------------------------------------------------------ No results for input(s): TSH, T4TOTAL, T3FREE, THYROIDAB in the last 72 hours.  Invalid input(s): FREET3   Coagulation profile No results for input(s): INR, PROTIME in the last 168 hours. ------------------------------------------------------------------------------------------------------------------- No results for input(s): DDIMER in the last 72 hours. -------------------------------------------------------------------------------------------------------------------  Cardiac Enzymes No results for input(s): CKMB, TROPONINI, MYOGLOBIN in the last 168 hours.  Invalid input(s): CK ------------------------------------------------------------------------------------------------------------------ Invalid input(s): POCBNP  ---------------------------------------------------------------------------------------------------------------  Urinalysis    Component Value Date/Time   COLORURINE YELLOW (A) 07/18/2017 0916   APPEARANCEUR CLEAR (A) 07/18/2017 0916   LABSPEC 1.023 07/18/2017 0916   PHURINE 5.0 07/18/2017 0916   GLUCOSEU NEGATIVE 07/18/2017 0916   HGBUR NEGATIVE 07/18/2017 0916   BILIRUBINUR NEGATIVE 07/18/2017 0916   KETONESUR NEGATIVE 07/18/2017 0916   PROTEINUR NEGATIVE 07/18/2017 0916   NITRITE NEGATIVE 07/18/2017 0916   LEUKOCYTESUR NEGATIVE 07/18/2017 0916      RADIOLOGY: No results found.  EKG: Orders placed or performed during the hospital encounter of 10/04/17  . EKG 12-Lead  . EKG 12-Lead    IMPRESSION AND PLAN:  * Ch systolic CHF, s/p AICD, hx of CAD   Cont carvedilol and enteresto, if BP is stable post -op on floor.   Pt is not on aspirin , no clear reason.    In her chart at [facility], I found she had GI bleed in feb 2018, due to PUD.   We may need to ask pt more details once he is out of anesthesia meds and pain is more controlled.  * DM   Advise to cont on ISS.   May add metformin and glargine - if blood sugar is high.  * s/p rotator cuff surgery   Pain management and further plan as per primary team.  * GERD   Protonix daily.  All the records are reviewed and case discussed with ED provider. Management plans discussed with the patient, family and they are in agreement.  CODE STATUS: Full. Code Status History    Date Active Date Inactive Code Status Order ID Comments User Context   07/24/2017 13:07 07/25/2017 14:13 Full Code [id number]  Gelene Mink Inpatient   06/26/2017 18:03 06/27/2017 12:54 Full Code [id number]  [name], MD Inpatient   12/04/2016 19:51 12/06/2016 18:10 Full Code [id number]  [name], MD Inpatient  Advance Directive Documentation     Most Recent Value  Type of Advance Directive  Healthcare Power of Attorney, Living will  Pre-existing out of facility DNR order (yellow form or pink MOST form)  No data  "MOST" Form in Place?  No data      TOTAL TIME TAKING CARE OF THIS PATIENT: 45 minutes.  We will cont to follow.  Vaughan Basta M.D on 10/04/2017   Between 7am to 6pm - Pager - [phone]  After 6pm go to [email or web address] - password [email or web address] [facility]  Office  [phone]  CC: Primary care physician; [name], MD   Note: This dictation was prepared with Dragon dictation along with smaller phrase technology. Any transcriptional  errors that result from this process are unintentional.

## 2017-10-04 NOTE — Transfer of Care (Signed)
Immediate Anesthesia Transfer of Care Note  Patient: Philip Ponce  Procedure(s) Performed: SHOULDER ARTHROSCOPY WITH ROTATOR CUFF REPAIR, SUBACROMIAL DECOMPRESSION,OPEN BICEP TENODESIS, EXTENSIVE DEBRIDEMENT (Left Shoulder)  Patient Location: PACU  Anesthesia Type:General  Level of Consciousness: awake  Airway & Oxygen Therapy: Patient Spontanous Breathing and Patient connected to face mask oxygen  Post-op Assessment: Report given to RN and Post -op Vital signs reviewed and stable  Post vital signs: Reviewed and stable  Last Vitals:  Vitals:   10/04/17 0905  BP: (!) 132/91  Pulse: 76  Resp: 16  Temp: 36.9 C  SpO2: 100%    Last Pain:  Vitals:   10/04/17 0905  TempSrc: Tympanic         Complications: No apparent anesthesia complications

## 2017-10-04 NOTE — Anesthesia Postprocedure Evaluation (Signed)
Anesthesia Post Note  Patient: Philip Ponce  Procedure(s) Performed: SHOULDER ARTHROSCOPY WITH ROTATOR CUFF REPAIR, SUBACROMIAL DECOMPRESSION,OPEN BICEP TENODESIS, EXTENSIVE DEBRIDEMENT (Left Shoulder)  Patient location during evaluation: PACU Anesthesia Type: General Level of consciousness: awake and alert and oriented Pain management: pain level controlled Vital Signs Assessment: post-procedure vital signs reviewed and stable Respiratory status: spontaneous breathing Cardiovascular status: blood pressure returned to baseline Anesthetic complications: no     Last Vitals:  Vitals:   10/04/17 1638 10/04/17 1645  BP:  (!) 148/83  Pulse: 78 85  Resp: 14 10  Temp:    SpO2: 100% 99%    Last Pain:  Vitals:   10/04/17 1648  TempSrc:   PainSc: 6                  Glorianne Proctor

## 2017-10-04 NOTE — Anesthesia Preprocedure Evaluation (Addendum)
Anesthesia Evaluation  Patient identified by MRN, date of birth, ID band Patient awake    Reviewed: Allergy & Precautions, H&P , NPO status , Patient's Chart, lab work & pertinent test results, reviewed documented beta blocker date and time   History of Anesthesia Complications (+) Family history of anesthesia reaction  Airway Mallampati: II  TM Distance: <3 FB Neck ROM: full    Dental  (+) Teeth Intact, Chipped   Pulmonary neg pulmonary ROS, sleep apnea ,    Pulmonary exam normal        Cardiovascular Exercise Tolerance: Poor hypertension, + Past MI, +CHF and + DOE  (-) Orthopnea and (-) PND negative cardio ROS Normal cardiovascular exam+ dysrhythmias + pacemaker + Cardiac Defibrillator  Rhythm:regular Rate:Normal     Neuro/Psych  Neuromuscular disease negative neurological ROS  negative psych ROS   GI/Hepatic negative GI ROS, Neg liver ROS, GERD  Medicated,  Endo/Other  negative endocrine ROSdiabetes, Type 1, Insulin Dependent  Renal/GU negative Renal ROS  negative genitourinary   Musculoskeletal  (+) Arthritis ,   Abdominal   Peds  Hematology negative hematology ROS (+) anemia ,   Anesthesia Other Findings Past Medical History: 11/20/2016: Acute blood loss anemia 06/26/2017: AICD (automatic cardioverter/defibrillator) present     Comment:  BIV No date: Chronic combined systolic (congestive) and diastolic  (congestive) heart failure (HCC)     Comment:  a. 11/2016 Ehco: EF 20-25%, glob HK, antsept, ant, apical              HK, Gr1 DD, mild MR, mildly dil LA, nl RV fxn; b. 05/2017               Echo: Ef 20-25%, Gr2 DD, prominent apical trabeculations. No date: Diabetes mellitus ([place]) No date: Family history of adverse reaction to anesthesia     Comment:  mom had a hard time waking up No date: GERD (gastroesophageal reflux disease) No date: NICM (nonischemic cardiomyopathy) ([place])     Comment:  a. 11/2016 Cath:  nl cors, EF 25-30%; b. 11/2016 Echo: EF               20-25%; c. 05/2017 Echo: EF 20-25%. No date: Slipped intervertebral disc     Comment:  L4 L5 No date: STEMI (ST elevation myocardial infarction) ([place])     Comment:  a. 11/2016 ST elevation -->Nl cors on cath. Past Surgical History: 06/26/2017: BIV ICD INSERTION CRT-D 06/26/2017: BIV ICD INSERTION CRT-D; N/A     Comment:  Procedure: BIV ICD INSERTION CRT-D;  Surgeon: [name], MD;  Location: [facility];  Service:              Cardiovascular;  Laterality: N/A; 12/03/2016: COLONOSCOPY WITH PROPOFOL; N/A     Comment:  Procedure: COLONOSCOPY WITH PROPOFOL;  Surgeon: [name], MD;  Location: [facility];  Service: Endoscopy;              Laterality: N/A; 12/03/2016: ESOPHAGOGASTRODUODENOSCOPY (EGD) WITH PROPOFOL; N/A     Comment:  Procedure: ESOPHAGOGASTRODUODENOSCOPY (EGD) WITH               PROPOFOL;  Surgeon: [name], MD;  Location: [facility]               ENDOSCOPY;  Service: Endoscopy;  Laterality: N/A; 2012: FINGER SURGERY; Right  Comment:  index finger No date: GASTRIC BYPASS 01/07/2017: GIVENS CAPSULE STUDY; N/A     Comment:  Procedure: GIVENS CAPSULE STUDY;  Surgeon: [name],               MD;  Location: [facility];  Service: Endoscopy;                Laterality: N/A; 12/04/2016: LEFT HEART CATH AND CORONARY ANGIOGRAPHY; N/A     Comment:  Procedure: Left Heart Cath and Coronary Angiography;                Surgeon: [name], MD;  Location: [facility];  Service: Cardiovascular;  Laterality: N/A; BMI    Body Mass Index:  24.28 kg/m     Reproductive/Obstetrics negative OB ROS                            Anesthesia Physical  Anesthesia Plan  ASA: III  Anesthesia Plan: General ETT   Post-op Pain Management:    Induction:   PONV Risk Score and Plan: 3 and Ondansetron, Dexamethasone, Midazolam and Propofol  infusion  Airway Management Planned: Oral ETT  Additional Equipment:   Intra-op Plan:   Post-operative Plan: Extubation in OR  Informed Consent: I have reviewed the patients History and Physical, chart, labs and discussed the procedure including the risks, benefits and alternatives for the proposed anesthesia with the patient or authorized representative who has indicated his/her understanding and acceptance.   Dental Advisory Given  Plan Discussed with: CRNA and Surgeon  Anesthesia Plan Comments: (Talked with patient about risks/ benefits of regional block for postop pain relief.  We have decided to forgo the interscalene block secondary to persistent LUE weakness, recent anterior cervical surgery with plates on the left side, AICD placement on the left side and significant sleep apnea.  Patient and his significant other agree with plan.)       Anesthesia Quick Evaluation

## 2017-10-04 NOTE — Anesthesia Procedure Notes (Signed)
Arterial Line Insertion Start/End1/07/2018 11:55 AM, 10/04/2017 11:59 AM Performed by: Alvin Critchley, MD, Johnna Acosta, CRNA, CRNA  Patient location: Pre-op. Preanesthetic checklist: patient identified, IV checked, site marked, risks and benefits discussed, surgical consent, monitors and equipment checked, pre-op evaluation, timeout performed and anesthesia consent Lidocaine 1% used for infiltration Right, radial was placed Catheter size: 20 Fr Hand hygiene performed  and maximum sterile barriers used   Attempts: 1 Procedure performed without using ultrasound guided technique. Following insertion, dressing applied. Post procedure assessment: normal and unchanged  Patient tolerated the procedure well with no immediate complications.

## 2017-10-04 NOTE — Op Note (Signed)
SURGERY DATE: 10/04/2017   PRE-OP DIAGNOSIS:  1. Left subacromial impingement 2. Left biceps tendinopathy 3. Left rotator cuff tear  POST-OP DIAGNOSIS: 1. Left subacromial impingement 2. Left biceps tendinopathy 3. Left rotator cuff tear  PROCEDURES:  1. Left arthroscopic rotator cuff repair 2. Left open subpectoral biceps tenodesis 3. Left subacromial decompression 4. Left extensive debridement of shoulder (glenohumeral and subacromial spaces)  SURGEON: Cato Mulligan, MD  ANESTHESIA: Gen   ESTIMATED BLOOD LOSS: 10cc  DRAINS:  none  TOTAL IV FLUIDS: per anesthesia   SPECIMENS: none  IMPLANTS:  - Arthrex 4.60mm SwiveLock - x1 - Arthrex 5.58mm SwiveLock - x2 - Arthrex - Double loaded FiberTak Suture Anchor - x1  OPERATIVE FINDINGS:  Examination under anesthesia: A careful examination under anesthesia was performed.  Passive range of motion was: FF: 140; ER at side: 40; ER in abduction: NT; IR in abduction: NT.  Anterior load shift: NT.  Posterior load shift: NT.  Sulcus in neutral: NT.  Sulcus in ER: NT.    Intra-operative findings: A thorough arthroscopic examination of the shoulder was performed.  The findings are: 1. Biceps tendon: tendinopathy with significant erythema 2. Superior labrum: Type 2 SLAP tear 3. Posterior labrum and capsule: normal 4. Inferior capsule and inferior recess: normal 5. Glenoid cartilage surface: Grade 1-2 degenerative changes near mid-portion of glenoid 6. Supraspinatus attachment: full-thickness tearing of the supraspinatus 7. Posterior rotator cuff attachment: normal 8. Humeral head articular cartilage: normal 9. Rotator interval: normal 10: Subscapularis tendon: attachment intact 11. Anterior labrum: normal 12. IGHL: normal  OPERATIVE REPORT:   Indications for procedure: Philip Ponce is a 47 y.o. year old male with chronic L shoulder pain and weakness with overhead activities. CT Arthrogram showed full-thickness rotator cuff  tear. He had failed non-surgical management including activity modification, medications, PT, and corticosteroid injections. After discussion of risks, benefits, and alternatives to surgery, the patient elected to proceed.   Procedure in detail:  I identified [name] in the pre-operative holding area.  I marked the operative shoulder with my initials. I reviewed the risks and benefits of the proposed surgical intervention, and the patient (and/or patient's guardian) wished to proceed. The patient was transferred to the operative suite and placed in the beach chair position.    SCDs were placed on the lower extremities. Appropriate IV antibiotics were administered within 1 hour before incision. The operative upper extremity was then prepped and draped in standard fashion. A time out was performed confirming the correct extremity, correct patient and correct procedure.   I then created a standard posterior portal with an 11 blade. The glenohumeral joint was easily entered with a blunt trochar and the arthroscope introduced. The findings of diagnostic arthroscopy are described above. I debrided degenerative tissue including labrum and cartilaginous surfaces and also coagulated the inflamed synovium to obtain hemostasis and reduce the risk of post-operative swelling using an Arthrocare radiofrequency device. I performed a biceps tenotomy using an arthroscopic scissor and used a motorized shaver to debride the stump back to a stable base.   I then directed my attention to the open subpectoral biceps tenodesis. I made an ~5cm incision parallel to the skin relaxation lines along the medial upper arm, such that 1/3 of the incision was above the pectoralis major tendon and 2/3 below it. Sharp dissection was carried down to the fascia overlying the pectoralis and the short head of the biceps. Bovie electrocautery was used to achieve hemostasis. I made a vertical nick in  the fascia in line with the short  head of the biceps. The pectoralis tendon was then retracted with a large Hohmann retractor, and the muscle belly of the short head of the biceps was retracted medially using a spade, keeping the retractor snuggly against the medial humerus to avoid neurovascular injury. An Army/Nav[employer] retractor was used to retract inferiorly. This afforded excellent visualization of the long head of the biceps tendon. It was delivered out of the incision. I then placed a double-loaded Arthrex FiberTak all-suture anchor at the most proximal and central portion of the inter-tubercular groove that I could visualize. I took one set of tapes and with the use of a free needle, passed one strand through the biceps tendon once and passed the other strand through the biceps in a locking fashion. Suture tapes were passed through the biceps tendon at the level of the musculocutaneous junction. This was also done for the other tape as well. This configuration allowed me to then parachute the tendon back into the wound as the two ends of the suture tape were tied together with a Surgeon's knot. This was repeated for the other strand of suture tape. The excess proximal tendon was sharply excised. The tension in the tenodesed long head was excellent. The wound was thoroughly irrigated with saline, and the incision closed in layers using 3-0 Vicryl for the deep dermis and a running subcuticular 4-0 Monocryl for skin. A thin layer of Dermabond was applied.   Next, the arthroscope was then introduced into the subacromial space. A direct lateral portal was created with an 11-blade after spinal needle localization. An extensive subacromial bursectomy was performed using a combination of the shaver and Arthrocare wand. The entire acromial undersurface was exposed and the CA ligament was subperiosteally elevated to expose the anterior acromial hook. A burr was used to create a flat anterior and lateral aspect of the acromion, converting it from a Type 2  to a Type 1 acromion. Care was made to keep the deltoid fascia intact.  Then, I debrided the remaining supraspinatus tendon to expose the entire bony footprint. I prepared the footprint using a bone cutter shaver on forward to expose bleeding bone. The edges of the torn rotator cuff were gently trimmed with the shaver.   I then percutaneously attempted to place a medial row anchor ([employer] SPEED). However, due to the poor quality of bone, it pulled out. Therefore, a 5.26mm Arthrex SwiveLock loaded with 2.73mm FiberTape, 1.46mm tape,a nd 2-0 suture was placed for our medial row anchor. I then shuttled each strand of tape from each anchor in a horizontal mattress fashion through the rotator cuff just lateral to the musculotendinous junction, in such a fashion that they were evenly spaced along the tear. This was done using an Museum/gallery curator.   I then attempted to place a 4.77mm SwiveLock with all 6 strands loaded to make our lateral row ~1 cm distal to the tip of the greater tuberosity. However, this pulled out of the bone due to poor bone quality. Therefore a more posterior and distal location was selected for a 5.46mm SwiveLock that was loaded with both 2.88mm FiberTapes, one 1.2 tape, and one 2-0 suture. This had good purchase. The other two strands were loaded into a 4.24mm SwiveLock and placed more anteriorly. This also achieved good fixation.  This reduced the rotator cuff tendon down onto the footprint with good compression and stability.   Fluid was evacuated from the shoulder, and the portals  were closed with 3-0 Nylon. Xeroform was applied to the portals. A sterile dressing was applied, followed by a Polar Care sleeve and a SlingShot shoulder immobilizer/sling. The patient awoke from anesthesia without difficulty and was transferred to the PACU in stable condition.     COMPLICATIONS: none  DISPOSITION: admit overnight for observation given multiple medical  co-morbidities   POSTOPERATIVE PLAN: Remain in sling (except hygiene and elbow/wrist/hand RoM exercises as instructed by PT) x 6 weeks and NWB for this time. PT to begin 3-4 days after surgery. Rotator cuff repair and biceps tenodesis rehab protocol.

## 2017-10-05 DIAGNOSIS — M75102 Unspecified rotator cuff tear or rupture of left shoulder, not specified as traumatic: Secondary | ICD-10-CM | POA: Diagnosis not present

## 2017-10-05 LAB — CBC
HEMATOCRIT: 37.2 % — AB (ref 40.0–52.0)
Hemoglobin: 12.2 g/dL — ABNORMAL LOW (ref 13.0–18.0)
MCH: 28.8 pg (ref 26.0–34.0)
MCHC: 32.7 g/dL (ref 32.0–36.0)
MCV: 87.9 fL (ref 80.0–100.0)
PLATELETS: 167 10*3/uL (ref 150–440)
RBC: 4.23 MIL/uL — AB (ref 4.40–5.90)
RDW: 13.1 % (ref 11.5–14.5)
WBC: 7.2 10*3/uL (ref 3.8–10.6)

## 2017-10-05 LAB — BASIC METABOLIC PANEL
ANION GAP: 6 (ref 5–15)
BUN: 12 mg/dL (ref 6–20)
CALCIUM: 8.2 mg/dL — AB (ref 8.9–10.3)
CO2: 25 mmol/L (ref 22–32)
Chloride: 103 mmol/L (ref 101–111)
Creatinine, Ser: 0.86 mg/dL (ref 0.61–1.24)
Glucose, Bld: 202 mg/dL — ABNORMAL HIGH (ref 65–99)
POTASSIUM: 3.9 mmol/L (ref 3.5–5.1)
Sodium: 134 mmol/L — ABNORMAL LOW (ref 135–145)

## 2017-10-05 MED ORDER — ASPIRIN 325 MG PO TBEC: 325.0000 mg | DELAYED_RELEASE_TABLET | Freq: Every day | ORAL | 0 refills | Status: DC

## 2017-10-05 MED ORDER — OXYCODONE HCL 5 MG PO TABS: 5.0000 mg | ORAL_TABLET | ORAL | 0 refills | Status: DC | PRN

## 2017-10-05 MED ORDER — TRAMADOL HCL 50 MG PO TABS: 50.0000 mg | ORAL_TABLET | ORAL | 0 refills | Status: DC | PRN

## 2017-10-05 NOTE — Progress Notes (Signed)
Per MD order. No work activity until follow up with surgeon on two weeks.

## 2017-10-05 NOTE — Evaluation (Signed)
Occupational Therapy Evaluation Patient Details Name: Philip Ponce MRN: [id number] DOB: 1971-01-29 Today's Date: 10/05/2017    History of Present Illness Pt is a50 y.o.malewith a known history of Anemia, CHFwith EF 25%, s/p AICD, MI, DM, HTN, was brought in for rotator cuff shoulder surgery by Dr. Posey Pronto. Admitting post -op for monitoring and medical consult for medical management.  Pt is s/p Leftarthroscopic rotator cuff repair, L open subpectoral biceps tenodesis, L subacromial decompression, and extensive debridement of L shoulder.   Clinical Impression   Pt seen for OT evaluation this date, POD#1. Pt was independent at baseline and eager to return to PLOF. Pt currently presents with decreased functional use of dominant LUE (immobilized), pain, deficits in strength/ROM, and needs increased assist for ADL tasks including UB dressing/bathing, polar care mgt, and sling/immobilizer mgt. Spouse present for session. Pt/spouse educated in polar care mgt, sling mgt, strategies for underarm grooming/bathing, UB dressing, falls prevention with functional mobility, and shoulder precautions as indicated by surgeon. Spoke with Dr. Posey Pronto to confirm NO PROM/AROM of shoulder, NO AROM of elbow flexion, PROM elbow flexion okay, NO forearm supination, and AROM of hand/digits okay). Pt/spouse verbalized understanding of all education/training provided. Good family support at home. Handout with education provided to support recall and adherence of precautions/strategies. Recommend follow up therapy as arranged by surgeon.      Follow Up Recommendations  No OT follow up;DC plan and follow up therapy as arranged by surgeon    Equipment Recommendations  None recommended by OT    Recommendations for Other Services       Precautions / Restrictions Precautions Precautions: Shoulder Type of Shoulder Precautions: No L shoulder A/PROM, L elbow flex and ext PROM only with no supination, AROM to L wrist and hand  to tolerance Shoulder Interventions: Shoulder sling/immobilizer Precaution Booklet Issued: Yes (comment) Restrictions Weight Bearing Restrictions: Yes LUE Weight Bearing: Non weight bearing      Mobility Bed Mobility Overal bed mobility: Modified Independent Bed Mobility: Supine to Sit;Sit to Supine     Supine to sit: Modified independent (Device/Increase time) Sit to supine: Modified independent (Device/Increase time)   General bed mobility comments: Extra time and effort for sup to sit with cues for sequencing, encouraged pt to sleep in R side of bed to improve independence and comfort with bed mobility  Transfers Overall transfer level: Independent Equipment used: None                  Balance Overall balance assessment: Needs assistance   Sitting balance-Leahy Scale: Normal     Standing balance support: During functional activity;No upper extremity supported Standing balance-Leahy Scale: Good                           ADL either performed or assessed with clinical judgement   ADL Overall ADL's : Needs assistance/impaired Eating/Feeding: Sitting;Modified independent Eating/Feeding Details (indicate cue type and reason): using non-dom hand Grooming: Modified independent Grooming Details (indicate cue type and reason): using non-dom hand; pt/spouse educated in underarm grooming strategies/precautions Upper Body Bathing: Minimal assistance;With caregiver independent assisting;Adhering to UE precautions Upper Body Bathing Details (indicate cue type and reason): pt/spouse educated in underarm bathing strategies/precautions, LUE positioning for bathing Lower Body Bathing: Sit to/from stand;Min guard   Upper Body Dressing : Sitting;Minimal assistance;Moderate assistance;With caregiver independent assisting;Adhering to UE precautions Upper Body Dressing Details (indicate cue type and reason): pt/spouse educated in UB dressing strategies while maintaining LUE  precautions  Lower Body Dressing: Sit to/from stand;Min guard;With caregiver independent assisting   Toilet Transfer: Ambulation;Comfort height toilet;Supervision/safety           Functional mobility during ADLs: Supervision/safety(pt/spouse educated in body awareness to maximize safety with functional mobility and protect LUE) General ADL Comments: pt/spouse educated in polar care positioning and mgt as well as sling/immobilizer positioning and mgt     Vision Baseline Vision/History: Wears glasses Wears Glasses: At all times Patient Visual Report: No change from baseline       Perception     Praxis      Pertinent Vitals/Pain Pain Assessment: 0-10 Pain Score: 3  Pain Location: L shoulder Pain Descriptors / Indicators: Sore Pain Intervention(s): Limited activity within patient's tolerance;Monitored during session;Premedicated before session;Ice applied     Hand Dominance Left   Extremity/Trunk Assessment Upper Extremity Assessment Upper Extremity Assessment: LUE deficits/detail(RUE WFL) LUE Deficits / Details: intact sensation LUE: Unable to fully assess due to immobilization   Lower Extremity Assessment Lower Extremity Assessment: Overall WFL for tasks assessed   Cervical / Trunk Assessment Cervical / Trunk Assessment: Normal   Communication Communication Communication: No difficulties   Cognition Arousal/Alertness: Awake/alert Behavior During Therapy: WFL for tasks assessed/performed Overall Cognitive Status: Within Functional Limits for tasks assessed                                 General Comments: getting a little groggy by end of session, due to recent pain meds per pt report   General Comments       Exercises Other Exercises Other Exercises: pt/spouse educated in head/neck stretches to prevent stiffness, hand/digit/wrist AROM, NO AROM elbow flexion, PROM elbow flexion okay, NO forearm supination, and NO AROM/PROM of shoulder Other  Exercises: pt/spouse educated in polar care mgt and falls prevention strategies   Shoulder Instructions Shoulder Instructions Donning/doffing shirt without moving shoulder: Caregiver independent with task Method for sponge bathing under operated UE: Independent Donning/doffing sling/immobilizer: Caregiver independent with task Correct positioning of sling/immobilizer: Independent ROM for elbow, wrist and digits of operated UE: Independent;Caregiver independent with task(no AROM elbow flexion, no supination of forearm, AROM for wrist/hand/digits) Sling wearing schedule (on at all times/off for ADL's): Independent;Caregiver independent with task Proper positioning of operated UE when showering: Independent;Caregiver independent with task Positioning of UE while sleeping: Independent;Caregiver independent with task    Home Living Family/patient expects to be discharged to:: Private residence Living Arrangements: Spouse/significant other;Children Available Help at Discharge: Family;Available PRN/intermittently Type of Home: House Home Access: Stairs to enter [employer] of Steps: 4 Entrance Stairs-Rails: Right;Left;Can reach both Home Layout: One level     Bathroom Shower/Tub: Occupational psychologist: Standard     Home Equipment: None          Prior Functioning/Environment Level of Independence: Independent        Comments: Ind amb community distances without AD, no fall history, Ind with ADLs        OT Problem List:        OT Treatment/Interventions:      OT Goals(Current goals can be found in the care plan section) Acute Rehab OT Goals Patient Stated Goal: To get to where I can exercise my arms again and make them stronger OT Goal Formulation: All assessment and education complete, DC therapy  OT Frequency:     Barriers to D/C:            Co-evaluation  AM-PAC PT "6 Clicks" Daily Activity     Outcome Measure Help from  another person eating meals?: None Help from another person taking care of personal grooming?: None Help from another person toileting, which includes using toliet, bedpan, or urinal?: None Help from another person bathing (including washing, rinsing, drying)?: A Little Help from another person to put on and taking off regular upper body clothing?: A Little Help from another person to put on and taking off regular lower body clothing?: A Little 6 Click Score: 21   End of Session    Activity Tolerance: Patient tolerated treatment well Patient left: in bed;with call bell/phone within reach;with bed alarm set;with family/visitor present;Other (comment)(shoulder sling/immobilizer and polar care in place)  OT Visit Diagnosis: Other abnormalities of gait and mobility (R26.89);Pain Pain - Right/Left: Left Pain - part of body: Shoulder                Time: 0940-1009 OT Time Calculation (min): 29 min Charges:  OT General Charges $OT Visit: 1 Visit OT Evaluation $OT Eval Low Complexity: 1 Low OT Treatments $Self Care/Home Management : 8-22 mins  Jeni Salles, MPH, MS, OTR/L ascom [phone] 10/05/17, 11:08 AM

## 2017-10-05 NOTE — Discharge Instructions (Signed)
Polar Care 24 hours a day. Do not change dressing Continue sling and abduction pillow Name removed the forearm and work on range of motion of the elbow Regular diet Follow-up clinic orthopedics in 1 week/as scheduled Call the clinic if temperatures greater than 101.5 No showers until staples are removed

## 2017-10-05 NOTE — Progress Notes (Signed)
Discharge summary reviewed with verbal understanding. Instructions for dressing and undressing left arm with spouse, and sling to be in place at all times. 2 narcotic Rxs given upon discharge. Work note provided per verbal order of MD/Surgeon and signed. To follow up with MD in two weeks. Escorted to personal vehicle via wc.

## 2017-10-05 NOTE — Discharge Summary (Signed)
Physician Discharge Summary  Patient ID: Philip Ponce MRN: [id number] DOB/AGE: 1971/04/22 47 y.o.  Admit date: 10/04/2017 Discharge date: 10/05/2017  Admission Diagnoses:  [place] TEAR   Discharge Diagnoses: Patient Active Problem List   Diagnosis Date Noted  . S/P left rotator cuff repair 10/04/2017  . Cervical radiculopathy 07/24/2017  . Rotator cuff tendonitis, left 05/06/2017  . Chronic systolic heart failure ([place]) 12/18/2016  . NICM (nonischemic cardiomyopathy) ([place]) 12/04/2016  . Iron deficiency anemia   . History of bariatric surgery 11/20/2016  . Toenail fungus 10/14/2015  . RBBB 04/07/2015  . LBBB (left bundle branch block) 09/24/2013  . Obstructive apnea 09/24/2013  . GERD 09/29/2010  . 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 Essential hypertension, benign 07/09/2007  . HYPERCHOLESTEROLEMIA, PURE 06/12/2007  . Diabetes mellitus with no complication ([place]) 40/37/0964    Past Medical History:  Diagnosis Date  . Acute blood loss anemia 11/20/2016  . 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 Chronic combined systolic (congestive) and diastolic (congestive) heart failure ([place])    a. 11/2016 Ehco: EF 20-25%, glob HK, antsept, ant, apical HK, Gr1 DD, mild MR, mildly dil LA, nl RV fxn; b. 05/2017 Echo: Ef 20-25%, Gr2 DD, prominent apical trabeculations.  . Diabetes mellitus ([place])   . Family history of adverse reaction to anesthesia    mom had a hard time waking up  . GERD (gastroesophageal reflux disease)   . NICM (nonischemic cardiomyopathy) ([place])    a. 11/2016 Cath: nl cors, EF 25-30%; b. 11/2016 Echo: EF 20-25%; c. 05/2017 Echo: EF 20-25%.  . Slipped intervertebral disc    L4 L5  . STEMI (ST elevation myocardial infarction) ([place])    a. 11/2016 ST elevation -->Nl cors on cath.     Transfusion: No transfusions during this admission   Consultants (if any): Treatment Team:  Vaughan Basta, MD  Discharged  Condition: Improved  Hospital Course: [name] is an 47 y.o. male who was admitted 10/04/2017 with a diagnosis of rotator cuff tear left shoulder and went to the operating room on 10/04/2017 and underwent the above named procedures.    Surgeries:Procedure(s): SHOULDER ARTHROSCOPY WITH ROTATOR CUFF REPAIR, SUBACROMIAL DECOMPRESSION,OPEN BICEP TENODESIS, EXTENSIVE DEBRIDEMENT on 10/04/2017            [] Hide copied text  [] Hover for details   SURGERY DATE: 10/04/2017   PRE-OP DIAGNOSIS:  1. Left subacromial impingement 2. Left biceps tendinopathy 3. Left rotator cuff tear  POST-OP DIAGNOSIS: 1. Left subacromial impingement 2. Left biceps tendinopathy 3. Left rotator cuff tear  PROCEDURES:  1. Left arthroscopic rotator cuff repair 2. Left open subpectoral biceps tenodesis 3. Left subacromial decompression 4. Left extensive debridement of shoulder (glenohumeral and subacromial spaces)  SURGEON: Cato Mulligan, MD  ANESTHESIA: Gen   ESTIMATED BLOOD LOSS: 10cc  DRAINS:  none  TOTAL IV FLUIDS: per anesthesia     SPECIMENS: none  IMPLANTS:  - Arthrex 4.28mm SwiveLock - x1 - Arthrex 5.45mm SwiveLock - x2 - Arthrex - Double loaded FiberTak Suture Anchor - x1  OPERATIVE FINDINGS:  Examination under anesthesia: A careful examination under anesthesia was performed.  Passive range of motion was: FF: 140; ER at side: 40; ER in abduction: NT; IR in abduction: NT.  Anterior load shift: NT.  Posterior load shift: NT.  Sulcus in neutral: NT.  Sulcus in ER: NT.    Intra-operative findings: A thorough arthroscopic examination of the shoulder was performed.  The findings are: 1. Biceps  tendon: tendinopathy with significant erythema 2. Superior labrum: Type 2 SLAP tear 3. Posterior labrum and capsule: normal 4. Inferior capsule and inferior recess: normal 5. Glenoid cartilage surface: Grade 1-2 degenerative changes near mid-portion of glenoid 6. Supraspinatus  attachment: full-thickness tearing of the supraspinatus 7. Posterior rotator cuff attachment: normal 8. Humeral head articular cartilage: normal 9. Rotator interval: normal 10: Subscapularis tendon: attachment intact 11. Anterior labrum: normal 12. IGHL: normal  OPERATIVE REPORT:   Indications for procedure: JARRON CURLEY is a 46 y.o. year old male with chronic L shoulder pain and weakness with overhead activities. CT Arthrogram showed full-thickness rotator cuff tear. He had failed non-surgical management including activity modification, medications, PT, and corticosteroid injections. After discussion of risks, benefits, and alternatives to surgery, the patient elected to proceed.           Patient tolerated the surgery well. No complications .Patient was taken to PACU where she was stabilized and then transferred to the orthopedic floor.  Patient started on enteric-coated aspirin 345 mg every 24 hours.Heels elevated off bed with rolled towels. No evidence of DVT. Calves non tender. Negative Homan. Physical therapy started on day #1 for gait training and transfer with OT starting on  day #1 for ADL and assisted devices. Patient has done well with therapy.        He was given perioperative antibiotics:  Anti-infectives (From admission, onward)   Start     Dose/Rate Route Frequency Ordered Stop   10/04/17 2130  ceFAZolin (ANCEF) IVPB 2g/100 mL premix     2 g 200 mL/hr over 30 Minutes Intravenous Every 6 hours 10/04/17 1753 10/05/17 1529   10/04/17 1021  ceFAZolin (ANCEF) 2-4 GM/100ML-% IVPB    Comments:  Slemenda, Debra   : cabinet override      10/04/17 1021 10/04/17 1115   10/03/17 2215  ceFAZolin (ANCEF) IVPB 2g/100 mL premix     2 g 200 mL/hr over 30 Minutes Intravenous  Once 10/03/17 2209 10/04/17 1528    .  He was fitted with sequential leg wraps  devices, instructed on heel pumps, early ambulation, and fitted with TED stockings bilaterally for DVT  prophylaxis.  He benefited maximally from the hospital stay and there were no complications.    Recent vital signs:  Vitals:   10/05/17 0442 10/05/17 0742  BP: (!) 144/85 (!) 155/83  Pulse: 97 92  Resp: 18   Temp: 100 F (37.8 C) 98 F (36.7 C)  SpO2: 98% 99%    Recent laboratory studies:  Lab Results  Component Value Date   HGB 12.2 (L) 10/05/2017   HGB 12.5 (L) 09/30/2017   HGB 12.2 (L) 09/27/2017   Lab Results  Component Value Date   WBC 7.2 10/05/2017   PLT 167 10/05/2017   Lab Results  Component Value Date   INR 1.11 07/18/2017   Lab Results  Component Value Date   NA 134 (L) 10/05/2017   K 3.9 10/05/2017   CL 103 10/05/2017   CO2 25 10/05/2017   BUN 12 10/05/2017   CREATININE 0.86 10/05/2017   GLUCOSE 202 (H) 10/05/2017    Discharge Medications:   Allergies as of 10/05/2017      Reactions   Mobic [meloxicam] Other (See Comments)   Ulcers in stomach eruption    Diclofenac Palpitations, Other (See Comments)   Fast heart beat, CP,SOB and weakness on one side.      Medication List    TAKE these medications   ADULT  GUMMY PO Take 2 tablets by mouth daily.   aspirin 325 MG EC tablet Take 1 tablet (325 mg total) by mouth daily.   calcium carbonate 600 MG Tabs tablet Commonly known as:  OS-CAL Take 1,200 mg by mouth daily with breakfast.   carvedilol 12.5 MG tablet Commonly known as:  COREG Take 1 tablet (12.5 mg total) by mouth 2 (two) times daily.   FISH OIL PO Take 800 mg by mouth daily.   glucose blood test strip Commonly known as:  ONE TOUCH ULTRA TEST Check blood sugar once daily and as instructed. Dx E11.9   metFORMIN 500 MG tablet Commonly known as:  GLUCOPHAGE Take 1,000 mg by mouth 2 (two) times daily with a meal.   methocarbamol 500 MG tablet Commonly known as:  ROBAXIN Take 1 tablet (500 mg total) by mouth every 6 (six) hours as needed for muscle spasms. What changed:    when to take this  reasons to take this   naproxen  sodium 220 MG tablet Commonly known as:  ALEVE Take 440 mg by mouth daily as needed (pain).   omeprazole 40 MG capsule Commonly known as:  PRILOSEC Take 40 mg by mouth daily as needed (indigestion).   oxyCODONE 5 MG immediate release tablet Commonly known as:  Oxy IR/ROXICODONE Take 1 tablet (5 mg total) by mouth every 3 (three) hours as needed for moderate pain ((score 4 to 6)). What changed:    when to take this  reasons to take this   oxyCODONE 5 MG immediate release tablet Commonly known as:  Oxy IR/ROXICODONE Take 1 tablet (5 mg total) by mouth every 3 (three) hours as needed for moderate pain ((score 4 to 6)). What changed:  You were already taking a medication with the same name, and this prescription was added. Make sure you understand how and when to take each.   sacubitril-valsartan 49-51 MG Commonly known as:  ENTRESTO Take 1 tablet by mouth 2 (two) times daily.   TOUJEO SOLOSTAR 300 UNIT/ML Sopn Generic drug:  Insulin Glargine Inject 15 Units into the skin every evening.   traMADol 50 MG tablet Commonly known as:  ULTRAM Take 1 tablet (50 mg total) by mouth every 4 (four) hours as needed.       Diagnostic Studies: No results found.  Disposition: 01-Home or Self Care  Discharge Instructions    Diet - low sodium heart healthy   Complete by:  As directed    Increase activity slowly   Complete by:  As directed          Signed: Shacoria Latif R. 10/05/2017, 8:31 AM

## 2017-10-05 NOTE — Evaluation (Signed)
Physical Therapy Evaluation Patient Details Name: Philip Ponce MRN: [id number] DOB: 10/08/1970 Today's Date: 10/05/2017   History of Present Illness  Pt is a35 y.o.malewith a known history of Anemia, CHFwith EF 25%, s/p AICD, MI, DM, HTN, was brought in for rotator cuff shoulder surgery by Dr. Posey Pronto. Admitting post -op for monitoring and medical consult for medical management.  Pt is s/p Leftarthroscopic rotator cuff repair, L open subpectoral biceps tenodesis, L subacromial decompression, and extensive debridement of L shoulder.    Clinical Impression  Pt somewhat limited with bed mobility tasks secondary to NWB and immobilized LUE but required only cues for sequencing and no physical assist.  Pt Ind and steady with transfers and was SBA with amb and stairs.  Provided general overview of precautions to LUE and with OT to provide more detailed education and HEP handouts.  Pt will benefit from OPPT services upon discharge to safely address above deficits for decreased caregiver assistance and eventual return to PLOF.      Follow Up Recommendations Outpatient PT    Equipment Recommendations  None recommended by PT    Recommendations for Other Services       Precautions / Restrictions Precautions Precautions: Shoulder Type of Shoulder Precautions: No L shoulder A/PROM, L elbow flex and ext PROM only with no supination, AROM to L wrist and hand to tolerance Shoulder Interventions: Shoulder sling/immobilizer Restrictions Weight Bearing Restrictions: Yes LUE Weight Bearing: Non weight bearing      Mobility  Bed Mobility Overal bed mobility: Modified Independent Bed Mobility: Supine to Sit;Sit to Supine     Supine to sit: Supervision Sit to supine: Independent   General bed mobility comments: Extra time and effort for sup to sit with cues for sequencing  Transfers Overall transfer level: Independent Equipment used: None                 Ambulation/Gait Ambulation/Gait assistance: Supervision Ambulation Distance (Feet): 1 x 200 Feet, 1 x 150 Feet Assistive device: None Gait Pattern/deviations: Step-through pattern;Decreased step length - right;Decreased step length - left   Gait velocity interpretation: Below normal speed for age/gender General Gait Details: Slow, somewhat cautious cadence with gait that improved during session but steady without LOB  Stairs Stairs: Yes Stairs assistance: Supervision Stair Management: One rail Right Number of Stairs: 4 General stair comments: Pt steady acsending and descending stairs  Wheelchair Mobility    Modified Rankin (Stroke Patients Only)       Balance Overall balance assessment: Needs assistance   Sitting balance-Leahy Scale: Normal     Standing balance support: During functional activity;No upper extremity supported Standing balance-Leahy Scale: Good Standing balance comment: Pt steady with unsupported standing activities but only able to maintain SLS on each LE 2-3 sec; static balance training with various foot positions and combinations of eyes open/closed and head still/head turns                             Pertinent Vitals/Pain Pain Assessment: 0-10 Pain Score: 5  Pain Location: L shoulder Pain Descriptors / Indicators: Sore Pain Intervention(s): Limited activity within patient's tolerance;Monitored during session;Premedicated before session    Home Living Family/patient expects to be discharged to:: Private residence Living Arrangements: Spouse/significant other;Children Available Help at Discharge: Family;Available PRN/intermittently Type of Home: House Home Access: Stairs to enter Entrance Stairs-Rails: Right;Left;Can reach both Entrance Stairs-Number of Steps: 4 Home Layout: One level Home Equipment: None  Prior Function Level of Independence: Independent         Comments: Ind amb community distances without AD, no fall  history, Ind with ADLs     Hand Dominance   Dominant Hand: Left    Extremity/Trunk Assessment   Upper Extremity Assessment Upper Extremity Assessment: Defer to OT evaluation;Generalized weakness;LUE deficits/detail LUE: Unable to fully assess due to immobilization    Lower Extremity Assessment Lower Extremity Assessment: Overall WFL for tasks assessed       Communication   Communication: No difficulties  Cognition Arousal/Alertness: Awake/alert Behavior During Therapy: WFL for tasks assessed/performed Overall Cognitive Status: Within Functional Limits for tasks assessed                                        General Comments      Exercises     Assessment/Plan    PT Assessment Patient needs continued PT services  PT Problem List Decreased strength;Decreased range of motion;Decreased balance;Decreased mobility       PT Treatment Interventions Gait training;Functional mobility training;Neuromuscular re-education;Balance training;Stair training;Therapeutic exercise;Therapeutic activities;Patient/family education    PT Goals (Current goals can be found in the Care Plan section)  Acute Rehab PT Goals Patient Stated Goal: To get to where I can exercise my arms again and make them stronger PT Goal Formulation: With patient Time For Goal Achievement: 10/18/17 Potential to Achieve Goals: Good    Frequency 7X/week   Barriers to discharge        Co-evaluation               AM-PAC PT "6 Clicks" Daily Activity  Outcome Measure Difficulty turning over in bed (including adjusting bedclothes, sheets and blankets)?: A Little Difficulty moving from lying on back to sitting on the side of the bed? : A Little Difficulty sitting down on and standing up from a chair with arms (e.g., wheelchair, bedside commode, etc,.)?: None Help needed moving to and from a bed to chair (including a wheelchair)?: None Help needed walking in hospital room?: None Help  needed climbing 3-5 steps with a railing? : None 6 Click Score: 22    End of Session Equipment Utilized During Treatment: Gait belt Activity Tolerance: Patient tolerated treatment well Patient left: in bed;with bed alarm set;with family/visitor present;with call bell/phone within reach;Other (comment)(Polar care to L shoulder, SCDs off for bathing) Nurse Communication: Mobility status PT Visit Diagnosis: Muscle weakness (generalized) (M62.81)    Time: 3149-7026 PT Time Calculation (min) (ACUTE ONLY): 35 min   Charges:   PT Evaluation $PT Eval Low Complexity: 1 Low PT Treatments $Gait Training: 8-22 mins   PT G Codes:        DRoyetta Asal PT, DPT 10/05/17, 10:50 AM

## 2017-10-05 NOTE — Progress Notes (Addendum)
Subjective: 1 Day Post-Op Procedure(s) (LRB): SHOULDER ARTHROSCOPY WITH ROTATOR CUFF REPAIR, SUBACROMIAL DECOMPRESSION,OPEN BICEP TENODESIS, EXTENSIVE DEBRIDEMENT (Left) Patient reports pain as 8 on 0-10 scale.   Patient is well, and has had no acute complaints or problems We will start therapy today.  Plan is to go Home after hospital stay. no nausea and no vomiting Patient denies any chest pains or shortness of breath. Objective: Vital signs in last 24 hours: Temp:  [97.6 F (36.4 C)-100 F (37.8 C)] 98 F (36.7 C) (01/12 0742) Pulse Rate:  [76-97] 92 (01/12 0742) Resp:  [10-18] 18 (01/12 0442) BP: (132-162)/(79-99) 155/83 (01/12 0742) SpO2:  [96 %-100 %] 99 % (01/12 0742) Arterial Line BP: (163-178)/(77-92) 172/81 (01/11 1700) Weight:  [85.7 kg (189 lb 0 oz)] 85.7 kg (189 lb 0 oz) (01/11 1826) Polar Care as well as shoulder immobilizer with abduction pillow in place Heels are non tender and elevated off the bed using rolled towels Intake/Output from previous day: 01/11 0701 - 01/12 0700 In: 2862.5 [P.O.:640; I.V.:2022.5; IV Piggyback:200] Out: 1505 [Urine:1500; Blood:5] Intake/Output this shift: No intake/output data recorded.  Recent Labs    10/05/17 0326  HGB 12.2*   Recent Labs    10/05/17 0326  WBC 7.2  RBC 4.23*  HCT 37.2*  PLT 167   Recent Labs    10/05/17 0326  NA 134*  K 3.9  CL 103  CO2 25  BUN 12  CREATININE 0.86  GLUCOSE 202*  CALCIUM 8.2*   No results for input(s): LABPT, INR in the last 72 hours.  EXAM General - Patient is Alert, Appropriate and Oriented Extremity - Neurologically intact Neurovascular intact Sensation intact distally Intact pulses distally Compartment soft Dressing - dressing C/D/I Motor Function - intact, moving hands and fingers very well. No evidence of any weakness. Good gross motor strength  Past Medical History:  Diagnosis Date  . Acute blood loss anemia 11/20/2016  . AICD (automatic  cardioverter/defibrillator) present 06/26/2017   BIV  . Chronic combined systolic (congestive) and diastolic (congestive) heart failure ([place])    a. 11/2016 Ehco: EF 20-25%, glob HK, antsept, ant, apical HK, Gr1 DD, mild MR, mildly dil LA, nl RV fxn; b. 05/2017 Echo: Ef 20-25%, Gr2 DD, prominent apical trabeculations.  . Diabetes mellitus ([place])   . Family history of adverse reaction to anesthesia    mom had a hard time waking up  . GERD (gastroesophageal reflux disease)   . NICM (nonischemic cardiomyopathy) ([place])    a. 11/2016 Cath: nl cors, EF 25-30%; b. 11/2016 Echo: EF 20-25%; c. 05/2017 Echo: EF 20-25%.  . Slipped intervertebral disc    L4 L5  . STEMI (ST elevation myocardial infarction) ([place])    a. 11/2016 ST elevation -->Nl cors on cath.    Assessment/Plan: 1 Day Post-Op Procedure(s) (LRB): SHOULDER ARTHROSCOPY WITH ROTATOR CUFF REPAIR, SUBACROMIAL DECOMPRESSION,OPEN BICEP TENODESIS, EXTENSIVE DEBRIDEMENT (Left) Active Problems:   S/P left rotator cuff repair  Estimated body mass index is 24.94 kg/m as calculated from the following:   Height as of this encounter: 6\' 1"  (1.854 m).   Weight as of this encounter: 85.7 kg (189 lb 0 oz). Advance diet Up with therapy Discharge home with home health  Labs: Were reviewed DVT Prophylaxis - TED hose and Sequential leg wraps Weight-Bearing as tolerated to bilateral lower extremity  D/C O2 and Pulse OX and try on Room Air Patient may be discharged home once he has a bowel movement  Jon R. Rogers Blocker PA [facility] 10/05/2017, 8:21 AM   ADDENDUM: No major concerns overnight other than pain, which is better controlled now.  LUE: Sling/polarcare in place +ain/pin/u/ax motor function Silt r/u/m/ax distr Hand wwp  Plan  - DC home today - NWB LUE, maintain sling - F/u in 2 weeks

## 2017-10-07 ENCOUNTER — Encounter: Payer: Self-pay | Admitting: Orthopedic Surgery

## 2017-10-07 DIAGNOSIS — Z9889 Other specified postprocedural states: Secondary | ICD-10-CM | POA: Diagnosis not present

## 2017-10-07 NOTE — Progress Notes (Signed)
Follow-up Outpatient Visit Date: 10/09/2017  Primary Care Provider: [name], MD [place] [address]  Chief Complaint: Follow-up chronic systolic heart failure  HPI:  Philip Ponce is a 47 y.o. year-old male with history of chronic systolic heart failure secondary to nonischemic cardiomyopathy, hypertension, and type 2 diabetes mellitus, who presents for follow-up of chronic systolic heart failure due to non-ischemic cardiomyopathy. He was last seen in our office in 05/2017 following repeat echo that showed persistent severe LV dysfunction. He was therefore referred to EP and underwent successful BiV-ICD placement by Dr. Curt Bears in 06/2017. He has followed closely with Darylene Price, NP, over the last few months; carvedilol was increased in December to 12.5 mg twice daily. He underwent left shoulder arthroscopy and rotator cuff repair last week and notes continued left shoulder pain.  This is gradually improving.  Today, Philip Ponce notes that he is starting to feel a little bit better in regard to his exertional dyspnea following biventricular pacemaker placement in October.  He continues to have occasional muscle twitches along the left lateral chest wall, which Dr. Curt Bears has been working on due to presumed extracardiac pacing from the LV lead.  Philip Ponce has been trying to increase his activity but has noted shortness of breath when even walking modest distances.  He is not sure that he will be able to return to his work, which is typically quite strenuous.  Even with normal activities around the house, he often needs to take a nap in the afternoon.  Philip Ponce denies chest pain, palpitations, lightheadedness, orthopnea, PND, and edema.  --------------------------------------------------------------------------------------------------  Cardiovascular History & Procedures: Cardiovascular Problems:  Chronic systolic heart failure secondary to nonischemic  cardiomyopathy  Risk Factors:  Hypertension, diabetes mellitus, and male gender  Cath/PCI:  LHC (12/04/16): No angiographically significant CAD. Moderate to severe LV contractile dysfunction (LVEF 30-35%) with mildly elevated left ventricular filling pressure.  LHC (09/28/13, [facility]): No significant CAD. LVEF 45%.  CV Surgery:  None  EP Procedures and Devices:  BiV-ICD placement (06/26/17, Dr. Curt Bears): [employer]  Non-Invasive Evaluation(s):  TTE (05/28/17): Moderately dilated LV with normal wall thickness. LVEF 20-25% with diffuse hypokinesis and grade 2 diastolic dysfunction. Normal RV size and function.  TTE (12/05/16): Moderately dilated left ventricle with LVEF of 20-25% and global hypokinesis. Grade 1 diastolic dysfunction. Mild MR. Mild left atrial enlargement. Normal RV size and function.  TTE (04/14/15): Normal LV size with mild LVH. LVEF 50-55% with normal wall motion. Grade 1 diastolic dysfunction. Mild left atrial enlargement. Normal RV size and function. No significant valvular abnormalities.  Recent CV Pertinent Labs: Lab Results  Component Value Date   CHOL 121 10/07/2015   HDL 66.70 10/07/2015   LDLCALC 39 10/07/2015   TRIG 78.0 10/07/2015   CHOLHDL 2 10/07/2015   INR 1.11 07/18/2017   INR 1.0 08/04/2012   K 3.9 10/05/2017   K 3.9 08/04/2012   MG 1.9 12/06/2016   MG 2.0 08/04/2012   BUN 12 10/05/2017   BUN 16 06/17/2017   BUN 21 (H) 08/04/2012   CREATININE 0.86 10/05/2017   CREATININE 0.98 11/23/2016    Past medical and surgical history were reviewed and updated in EPIC.  Current Meds  Medication Sig  . acetaminophen (TYLENOL) 500 MG tablet Take 500 mg by mouth every 6 (six) hours as needed.  Marland Kitchen aspirin EC 325 MG EC tablet Take 1 tablet (325 mg total) by mouth daily.  . calcium carbonate (OS-CAL) 600 MG  TABS tablet Take 1,200 mg by mouth daily with breakfast.   . carvedilol (COREG) 12.5 MG tablet Take 1 tablet (12.5 mg total) by mouth 2 (two) times  daily.  Marland Kitchen glucose blood (ONE TOUCH ULTRA TEST) test strip Check blood sugar once daily and as instructed. Dx E11.9  . Insulin Glargine (TOUJEO SOLOSTAR) 300 UNIT/ML SOPN Inject 15 Units into the skin every evening.   . metFORMIN (GLUCOPHAGE) 500 MG tablet Take 1,000 mg by mouth 2 (two) times daily with a meal.  . Multiple Vitamins-Minerals (ADULT GUMMY PO) Take 2 tablets by mouth daily.  . Omega-3 Fatty Acids (FISH OIL PO) Take 800 mg by mouth daily.  Marland Kitchen omeprazole (PRILOSEC) 40 MG capsule Take 40 mg by mouth daily as needed (indigestion).   . [DISCONTINUED] sacubitril-valsartan (ENTRESTO) 49-51 MG Take 1 tablet by mouth 2 (two) times daily.    Allergies: Mobic [meloxicam]; Tramadol; and Diclofenac  Social History   Socioeconomic History  . Marital status: Married    Spouse name: Not on file  . Number of children: Not on file  . Years of education: Not on file  . Highest education level: Not on file  Social Needs  . Financial resource strain: Not on file  . Food insecurity - worry: Not on file  . Food insecurity - inability: Not on file  . Transportation needs - medical: Not on file  . Transportation needs - non-medical: Not on file  Occupational History  . Occupation: Teacher, early years/pre, Scientist, product/process development    Employer: [employer]  Tobacco Use  . Smoking status: Never Smoker  . Smokeless tobacco: Never Used  Substance and Sexual Activity  . Alcohol use: No  . Drug use: No  . Sexual activity: Yes  Other Topics Concern  . Not on file  Social History Narrative   Regular exercise-yes, walking one mile per day   Diet: fast food, diet soda, unsweeted tea    Family History  Problem Relation Age of Onset  . Diabetes Mother   . Hypertension Mother   . Cancer Mother        breast  . Diabetes Father   . Hypertension Father   . Cancer Father        colon  . Cancer Maternal Grandfather        prostate  . Stroke Maternal Grandfather        CVA    Review of Systems: A  12-system review of systems was performed and was negative except as noted in the HPI.  --------------------------------------------------------------------------------------------------  Physical Exam: BP 120/70 (BP Location: Right Arm, Patient Position: Sitting, Cuff Size: Normal)   Pulse 80   Ht 6\' 1"  (1.854 m)   Wt 184 lb (83.5 kg)   BMI 24.28 kg/m   General: Well-developed, well-nourished man, seated comfortably in the exam room.  He is accompanied by his wife. HEENT: No conjunctival pallor or scleral icterus. Moist mucous membranes.  OP clear. Neck: Supple without lymphadenopathy, thyromegaly, JVD, or HJR. Lungs: Normal work of breathing. Clear to auscultation bilaterally without wheezes or crackles. Heart: Regular rate and rhythm without murmurs, rubs, or gallops. Non-displaced PMI. Abd: Bowel sounds present. Soft, NT/ND without hepatosplenomegaly Ext: No lower extremity edema. Radial, PT, and DP pulses are 2+ bilaterally.  Left arm is restrained with a sling. Skin: Warm and dry without rash.  EKG (09/27/17): Normal sinus rhythm with biventricular pacing.  Lab Results  Component Value Date   WBC 7.2 10/05/2017   HGB 12.2 (  L) 10/05/2017   HCT 37.2 (L) 10/05/2017   MCV 87.9 10/05/2017   PLT 167 10/05/2017    Lab Results  Component Value Date   NA 134 (L) 10/05/2017   K 3.9 10/05/2017   CL 103 10/05/2017   CO2 25 10/05/2017   BUN 12 10/05/2017   CREATININE 0.86 10/05/2017   GLUCOSE 202 (H) 10/05/2017   ALT 15 01/03/2016    Lab Results  Component Value Date   CHOL 121 10/07/2015   HDL 66.70 10/07/2015   LDLCALC 39 10/07/2015   TRIG 78.0 10/07/2015   CHOLHDL 2 10/07/2015    --------------------------------------------------------------------------------------------------  ASSESSMENT AND PLAN: Chronic systolic heart failure secondary to nonischemic cardiomyopathy Philip Ponce is euvolemic on exam today.  He continues to have some limitations in his functional  capacity consistent with NYHA class II-III heart failure.  He is tolerating evidence-based heart failure therapy with carvedilol and Entresto well.  We have agreed to increase Entresto today.  I will check a basic metabolic panel in 2 weeks.  If his renal function and electrolytes allow at that time, we will consider adding spironolactone.  I will plan to repeat a limited echocardiogram shortly before Philip Ponce returns to see me in 3 months.  If his functional capacity does not improve in the meantime and his LVEF remains severely reduced, I will refer him to our advanced heart failure clinic in [place].  Status post CRT Philip Ponce has noted some improvement in his shortness of breath following biventricular pacemaker placement.  However, he continues to feel some chest wall stimulation from his LV lead.  I have advised him to follow-up with Dr. Curt Bears to see if any further adjustments can be made to his device.  Essential hypertension Blood pressure is reasonable today.  As above, we will increase Entresto to optimize evidence-based heart failure therapy.  Repeat BMP in 2 weeks.  Follow-up: Return to clinic in 3 months with limited echocardiogram to evaluate LVEF shortly before that visit.  [name], MD 10/09/2017 8:50 PM

## 2017-10-09 ENCOUNTER — Encounter: Payer: Self-pay | Admitting: *Deleted

## 2017-10-09 ENCOUNTER — Encounter: Payer: Self-pay | Admitting: Internal Medicine

## 2017-10-09 ENCOUNTER — Ambulatory Visit: Payer: 59 | Admitting: Internal Medicine

## 2017-10-09 ENCOUNTER — Ambulatory Visit (INDEPENDENT_AMBULATORY_CARE_PROVIDER_SITE_OTHER): Payer: 59 | Admitting: Internal Medicine

## 2017-10-09 VITALS — BP 120/70 | HR 80 | Ht 73.0 in | Wt 184.0 lb

## 2017-10-09 DIAGNOSIS — I5022 Chronic systolic (congestive) heart failure: Secondary | ICD-10-CM

## 2017-10-09 DIAGNOSIS — I428 Other cardiomyopathies: Secondary | ICD-10-CM

## 2017-10-09 DIAGNOSIS — Z79899 Other long term (current) drug therapy: Secondary | ICD-10-CM | POA: Diagnosis not present

## 2017-10-09 DIAGNOSIS — Z950 Presence of cardiac pacemaker: Secondary | ICD-10-CM | POA: Diagnosis not present

## 2017-10-09 DIAGNOSIS — I10 Essential (primary) hypertension: Secondary | ICD-10-CM

## 2017-10-09 MED ORDER — SACUBITRIL-VALSARTAN 97-103 MG PO TABS: 1.0000 | ORAL_TABLET | Freq: Two times a day (BID) | ORAL | 3 refills | Status: DC

## 2017-10-09 NOTE — Patient Instructions (Signed)
Medication Instructions:  Your physician has recommended you make the following change in your medication:  1- INCREASE Entresto to 91-103 mg by mouth two times a day.   Labwork: Your physician recommends that you return for lab work in: [place], October 23, 2017. (BMET)  - Please go to the [facility]. You will check in at the front desk to the right as you walk into the atrium. Valet Parking is offered if needed.    Testing/Procedures: Your physician has requested that you have an LIMITED echocardiogram IN 3 MONTHS PRIOR TO appointment WITH DR END. Echocardiography is a painless test that uses sound waves to create images of your heart. It provides your doctor with information about the size and shape of your heart and how well your heart's chambers and valves are working. This procedure takes approximately one hour. There are no restrictions for this procedure.    Follow-Up: Your physician recommends that you schedule a follow-up appointment in: 3 MONTHS WITH DR END. (MAKE SURE ECHO IS COMPLETED PRIOR TO APPOINTMENT.   If you need a refill on your cardiac medications before your next appointment, please call your pharmacy.   Echocardiogram An echocardiogram, or echocardiography, uses sound waves (ultrasound) to produce an image of your heart. The echocardiogram is simple, painless, obtained within a short period of time, and offers valuable information to your health care provider. The images from an echocardiogram can provide information such as:  Evidence of coronary artery disease (CAD).  Heart size.  Heart muscle function.  Heart valve function.  Aneurysm detection.  Evidence of a past heart attack.  Fluid buildup around the heart.  Heart muscle thickening.  Assess heart valve function.  Tell a health care provider about:  Any allergies you have.  All medicines you are taking, including vitamins, herbs, eye drops, creams, and  over-the-counter medicines.  Any problems you or family members have had with anesthetic medicines.  Any blood disorders you have.  Any surgeries you have had.  Any medical conditions you have.  Whether you are pregnant or may be pregnant. What happens before the procedure? No special preparation is needed. Eat and drink normally. What happens during the procedure?  In order to produce an image of your heart, gel will be applied to your chest and a wand-like tool (transducer) will be moved over your chest. The gel will help transmit the sound waves from the transducer. The sound waves will harmlessly bounce off your heart to allow the heart images to be captured in real-time motion. These images will then be recorded.  You may need an IV to receive a medicine that improves the quality of the pictures. What happens after the procedure? You may return to your normal schedule including diet, activities, and medicines, unless your health care provider tells you otherwise. This information is not intended to replace advice given to you by your health care provider. Make sure you discuss any questions you have with your health care provider. Document Released: 09/07/2000 Document Revised: 04/28/2016 Document Reviewed: 05/18/2013 Elsevier Interactive Patient Education  2017 [employer].

## 2017-10-14 ENCOUNTER — Telehealth: Payer: Self-pay | Admitting: Internal Medicine

## 2017-10-14 NOTE — Telephone Encounter (Signed)
I called and spoke with the patient's wife. She states that with insurance, Philip Ponce will still cost $500 for 90 day supply until their deductible is met.  She stated the pharmacy was supposed to be faxing [place] something.  No faxes received.   Call placed to [facility] on [address].  Per the pharmacist- this was for a PA. She will re-fax.

## 2017-10-14 NOTE — Telephone Encounter (Signed)
Please call regarding Philip Ponce, pt wife states they are having difficulty getting it filled due his insurance.

## 2017-10-15 ENCOUNTER — Encounter: Payer: Self-pay | Admitting: *Deleted

## 2017-10-15 NOTE — Telephone Encounter (Signed)
S/w patient's wife ok per DPR.  I let her know that the prior authorization for Delene Loll was approved and she should be able to call the pharmacy now. I had a message from Dr End as well to have patient scheduled to be seen in the device clinic at patient's earliest convenience for lead adjustment.  I offered to transfer her to a scheduler to schedule and she said she had the number and would call to schedule soon. She was very appreciative of all our help.

## 2017-10-15 NOTE — Telephone Encounter (Signed)
Pt is returning your call

## 2017-10-15 NOTE — Telephone Encounter (Signed)
No answer. Left message to call back.   

## 2017-10-15 NOTE — Telephone Encounter (Signed)
Pt wife is calling regarding the [place] PA. States it is ok to leave a detailed message.

## 2017-10-15 NOTE — Telephone Encounter (Signed)
S/w [employer] representative. Key for prior auth generated: [id number]  Prior authoization for [employer] submitted. Received approval for coverage date 09/15/2017 to 10/15/2018. Case ID [id number].

## 2017-10-17 ENCOUNTER — Telehealth: Payer: Self-pay | Admitting: Internal Medicine

## 2017-10-17 NOTE — Telephone Encounter (Signed)
Patient wife calling to see if we received disability forms through fax Let patient know we have not received Patient will get in contact with social worker to refax

## 2017-10-18 NOTE — Telephone Encounter (Signed)
Received [employer] Disability claim medical records request .  Patient came to the office to office completed roi and submitted money order for fee.    Patient given [employer] information packet.  Mailed request roi and money order to [employer] via interoffice mail.

## 2017-10-24 ENCOUNTER — Encounter: Payer: Self-pay | Admitting: Orthopedic Surgery

## 2017-11-01 DIAGNOSIS — Z9889 Other specified postprocedural states: Secondary | ICD-10-CM | POA: Diagnosis not present

## 2017-11-04 ENCOUNTER — Encounter: Payer: Self-pay | Admitting: Podiatry

## 2017-11-04 ENCOUNTER — Ambulatory Visit (INDEPENDENT_AMBULATORY_CARE_PROVIDER_SITE_OTHER): Payer: 59 | Admitting: Podiatry

## 2017-11-04 DIAGNOSIS — B351 Tinea unguium: Secondary | ICD-10-CM

## 2017-11-04 DIAGNOSIS — M79676 Pain in unspecified toe(s): Secondary | ICD-10-CM | POA: Diagnosis not present

## 2017-11-04 DIAGNOSIS — E119 Type 2 diabetes mellitus without complications: Secondary | ICD-10-CM

## 2017-11-04 NOTE — Progress Notes (Signed)
Complaint:  Visit Type: Patient returns to my office for continued preventative foot care services. Complaint: Patient states" my nails have grown long and thick and become painful to walk and wear shoes" Patient has been diagnosed with DM with no foot complications. The patient presents for preventative foot care services. No changes to ROS. Patient has had shoulder surgery.  Podiatric Exam: Vascular: dorsalis pedis and posterior tibial pulses are palpable bilateral. Capillary return is immediate. Temperature gradient is WNL. Skin turgor WNL  Sensorium: Normal Semmes Weinstein monofilament test. Normal tactile sensation bilaterally. Nail Exam: Pt has thick disfigured discolored nails with subungual debris noted bilateral entire nail hallux through fifth toenails Ulcer Exam: There is no evidence of ulcer or pre-ulcerative changes or infection. Orthopedic Exam: Muscle tone and strength are WNL. No limitations in general ROM. No crepitus or effusions noted. Foot type and digits show no abnormalities. Bony prominences are unremarkable. HAV  B/L.  Pes planus.  Skin: No Porokeratosis. No infection or ulcers  Diagnosis:  Onychomycosis, , Pain in right toe, pain in left toes  Treatment & Plan Procedures and Treatment: Consent by patient was obtained for treatment procedures. The patient understood the discussion of treatment and procedures well. All questions were answered thoroughly reviewed. Debridement of mycotic and hypertrophic toenails, 1 through 5 bilateral and clearing of subungual debris. No ulceration, no infection noted.  Return Visit-Office Procedure: Patient instructed to return to the office for a follow up visit 3 months for continued evaluation and treatment.    Gardiner Barefoot DPM

## 2017-11-08 DIAGNOSIS — Z9889 Other specified postprocedural states: Secondary | ICD-10-CM | POA: Diagnosis not present

## 2017-11-15 DIAGNOSIS — Z9889 Other specified postprocedural states: Secondary | ICD-10-CM | POA: Diagnosis not present

## 2017-11-15 NOTE — Progress Notes (Signed)
Patient ID: Philip Ponce, male    DOB: 1971/07/22, 47 y.o.   MRN: [id number]  HPI  Mr Blick is a 47 yo male with PMH significant for DM, iron deficiency, HTN, gastric bypass, STEMI and CHF with reduced ejection fraction.  Echo report from 05/28/17 reviewed and showed an EF of 20-25%. Echo report done 12/05/16 was reviewed and showed an EF of 20-25% with mild MR. Cardiac catheterization done 12/04/16 showed an EF of 30-35% and no significant CAD. Echo on 04/14/15 showed EF of 50-55%.  Admitted 10/04/17 for left rotator cuff repair. Discharged the following day. Admitted 07/24/17 for C5-C7 arthroplasty. Was discharged the following day. Admitted 06/26/17 for ICD implantation and was discharged the next day.   Presents today for a follow-up visit with a chief complaint of minimal shortness of breath upon moderate exertion. He says that this has been present for several years although he does feel like it's improving. He has associated fatigue, left shoulder pain, difficulty sleeping (due to shoulder) and gradual weight gain. He denies any dizziness, abdominal distention, palpitations, edema, chest pain or cough.   Past Medical History:  Diagnosis Date  . Acute blood loss anemia 11/20/2016  . 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 Chronic combined systolic (congestive) and diastolic (congestive) heart failure ([place])    a. 11/2016 Ehco: EF 20-25%, glob HK, antsept, ant, apical HK, Gr1 DD, mild MR, mildly dil LA, nl RV fxn; b. 05/2017 Echo: Ef 20-25%, Gr2 DD, prominent apical trabeculations.  . Diabetes mellitus ([place])   . Family history of adverse reaction to anesthesia    mom had a hard time waking up  . GERD (gastroesophageal reflux disease)   . NICM (nonischemic cardiomyopathy) ([place])    a. 11/2016 Cath: nl cors, EF 25-30%; b. 11/2016 Echo: EF 20-25%; c. 05/2017 Echo: EF 20-25%.  . Slipped intervertebral disc    L4 L5  . STEMI (ST elevation myocardial infarction)  ([place])    a. 11/2016 ST elevation -->Nl cors on cath.   Past Surgical History:  Procedure Laterality Date  . BIV ICD INSERTION CRT-D  06/26/2017  . BIV ICD INSERTION CRT-D N/A 06/26/2017   Procedure: BIV ICD INSERTION CRT-D;  Surgeon: [name], MD;  Location: [place];  Service: Cardiovascular;  Laterality: N/A;  . CERVICAL DISC ARTHROPLASTY N/A 07/24/2017   Procedure: CERVICAL ANTERIOR [place] ARTHROPLASTY C5-C7;  Surgeon: [name], MD;  Location: [facility];  Service: Neurosurgery;  Laterality: N/A;  . COLONOSCOPY WITH PROPOFOL N/A 12/03/2016   Procedure: COLONOSCOPY WITH PROPOFOL;  Surgeon: [name], MD;  Location: [facility];  Service: Endoscopy;  Laterality: N/A;  . ESOPHAGOGASTRODUODENOSCOPY (EGD) WITH PROPOFOL N/A 12/03/2016   Procedure: ESOPHAGOGASTRODUODENOSCOPY (EGD) WITH PROPOFOL;  Surgeon: [name], MD;  Location: [facility];  Service: Endoscopy;  Laterality: N/A;  . FINGER SURGERY Right 2012   index finger  . GASTRIC BYPASS    . GIVENS CAPSULE STUDY N/A 01/07/2017   Procedure: GIVENS CAPSULE STUDY;  Surgeon: [name], MD;  Location: [facility];  Service: Endoscopy;  Laterality: N/A;  . LEFT HEART CATH AND CORONARY ANGIOGRAPHY N/A 12/04/2016   Procedure: Left Heart Cath and Coronary Angiography;  Surgeon: [name], MD;  Location: [place] [facility];  Service: Cardiovascular;  Laterality: N/A;  . SHOULDER ARTHROSCOPY WITH OPEN ROTATOR CUFF REPAIR Left 10/04/2017   Procedure: SHOULDER ARTHROSCOPY WITH ROTATOR CUFF REPAIR, SUBACROMIAL DECOMPRESSION,OPEN BICEP TENODESIS, EXTENSIVE DEBRIDEMENT;  Surgeon: [name], MD;  Location:  [facility];  Service: Orthopedics;  Laterality: Left;   Family History  Problem Relation Age of Onset  . Diabetes Mother   . Hypertension Mother   . Cancer Mother        breast  . Diabetes Father   . Hypertension Father   . Cancer Father        colon  . Cancer Maternal Grandfather        prostate  .  Stroke Maternal Grandfather        CVA   Social History   Tobacco Use  . Smoking status: Never Smoker  . Smokeless tobacco: Never Used  Substance Use Topics  . Alcohol use: No   Allergies  Allergen Reactions  . Mobic [Meloxicam] Other (See Comments)    Ulcers in stomach eruption   . Tramadol     Hard to breath  . Diclofenac Palpitations and Other (See Comments)    Fast heart beat, CP,SOB and weakness on one side.   Prior to Admission medications   Medication Sig Start Date End Date Taking? Authorizing Provider  acetaminophen (TYLENOL) 500 MG tablet Take 500 mg by mouth every 6 (six) hours as needed.   Yes [provider]  calcium carbonate (OS-CAL) 600 MG TABS tablet Take 1,200 mg by mouth daily with breakfast.    Yes [provider]  carvedilol (COREG) 12.5 MG tablet Take 1 tablet (12.5 mg total) by mouth 2 (two) times daily. 09/10/17 12/09/17 Yes Salmaan Patchin A, FNP  glucose blood (ONE TOUCH ULTRA TEST) test strip Check blood sugar once daily and as instructed. Dx E11.9 01/19/16  Yes Bedsole, Amy E, MD  Insulin Glargine (TOUJEO SOLOSTAR) 300 UNIT/ML SOPN Inject 15 Units into the skin every evening.    Yes [provider]  metFORMIN (GLUCOPHAGE) 500 MG tablet Take 1,000 mg by mouth 2 (two) times daily with a meal.   Yes [provider]  Multiple Vitamins-Minerals (ADULT GUMMY PO) Take 2 tablets by mouth daily.   Yes [provider]  Omega-3 Fatty Acids (FISH OIL PO) Take 800 mg by mouth daily.   Yes [provider]  omeprazole (PRILOSEC) 40 MG capsule Take 40 mg by mouth daily as needed (indigestion).    Yes [provider]  sacubitril-valsartan (ENTRESTO) 97-103 MG Take 1 tablet by mouth 2 (two) times daily. 10/09/17  Yes End, Harrell Gave, MD  aspirin EC 325 MG EC tablet Take 1 tablet (325 mg total) by mouth daily. Patient not taking: Reported on 11/19/2017 10/05/17   Watt Climes, PA    Review of Systems   Constitutional: Positive for fatigue. Negative for appetite change.  HENT: Negative for congestion, postnasal drip and sore throat.   Eyes: Negative.   Respiratory: Positive for shortness of breath (with exertion). Negative for cough, chest tightness, wheezing and stridor.   Cardiovascular: Negative for chest pain, palpitations and leg swelling.  Gastrointestinal: Negative for abdominal distention and abdominal pain.  Endocrine: Negative.   Genitourinary: Negative.   Musculoskeletal: Positive for arthralgias (left shoulder pain). Negative for back pain, neck pain and neck stiffness.  Skin: Negative.   Allergic/Immunologic: Negative.   Neurological: Negative for dizziness and light-headedness.  Hematological: Negative for adenopathy. Does not bruise/bleed easily.  Psychiatric/Behavioral: Positive for sleep disturbance (disrupted sleep pattern due to left shoulder pain). Negative for dysphoric mood. The patient is not nervous/anxious.    Vitals:   11/19/17 0853  BP: 126/84  Pulse: 83  Temp: 98.6 F (37 C)  TempSrc:  Oral  Weight: 189 lb 12.8 oz (86.1 kg)  Height: 6\' 1"  (1.854 m)   Wt Readings from Last 3 Encounters:  11/19/17 189 lb 12.8 oz (86.1 kg)  10/09/17 184 lb (83.5 kg)  10/04/17 189 lb 0 oz (85.7 kg)   Lab Results  Component Value Date   CREATININE 0.86 10/05/2017   CREATININE 0.96 09/27/2017   CREATININE 0.89 06/17/2017    Physical Exam  Constitutional: He is oriented to person, place, and time. He appears well-developed and well-nourished.  HENT:  Head: Normocephalic and atraumatic.  Neck: Normal range of motion. Neck supple. No JVD present.  Cardiovascular: Normal rate and regular rhythm.  Pulmonary/Chest: Effort normal. He has no wheezes. He has no rales.  Abdominal: Soft. He exhibits no distension. There is no tenderness.  Musculoskeletal: He exhibits no edema.       Left shoulder: He exhibits decreased range of motion and tenderness.  Neurological: He is  alert and oriented to person, place, and time.  Skin: Skin is warm and dry.  Psychiatric: He has a normal mood and affect. His behavior is normal. Thought content normal.  Nursing note and vitals reviewed.   Assessment & Plan:  1. Congestive heart failure with reduced ejection fraction - NYHA class II - euvolemic - weighing daily. Reminded to call for weight gain of > 2 lbs overnight or > 5 lbs in one week - not adding salt. Reminded to closely follow a 2000mg  sodium diet - has been increasing his activity although resting when tired; recently was able to wash and dry his car without having to stop part-way through - saw cardiologist (End) 10/09/17 - no shocks from his AICD although continues to have a "quivering" sensation - on max entresto dose; could consider increasing his carvedilol to 25mg  BID at his next visit - PharmD reconciled medications with the patient - patient reports receiving his flu vaccine for this season  2. Diabetes - A1c on 07/18/17 was 6.9% - nonfasting glucose in clinic today was 153 - Saw PCP on 12/27/16 (Bedsole)  3. HTN - BP looked good today  - BMP on 10/05/17 reviewed and showed potassium 3.9, sodium 134 and GFR >60  4: Left rotator cuff repair- - repaired 10/04/17 and is currently going through physical therapy  Patient did not bring his medications nor a list. Each medication was verbally reviewed with the patient and he was encouraged to bring the bottles to every visit to confirm accuracy of list.  Return in 3 months or sooner for any questions/problems before then.

## 2017-11-19 ENCOUNTER — Encounter: Payer: Self-pay | Admitting: Family

## 2017-11-19 ENCOUNTER — Other Ambulatory Visit: Payer: Self-pay

## 2017-11-19 ENCOUNTER — Ambulatory Visit: Payer: 59 | Attending: Family | Admitting: Family

## 2017-11-19 VITALS — BP 126/84 | HR 83 | Temp 98.6°F | Ht 73.0 in | Wt 189.8 lb

## 2017-11-19 DIAGNOSIS — E119 Type 2 diabetes mellitus without complications: Secondary | ICD-10-CM | POA: Insufficient documentation

## 2017-11-19 DIAGNOSIS — I252 Old myocardial infarction: Secondary | ICD-10-CM | POA: Diagnosis not present

## 2017-11-19 DIAGNOSIS — K219 Gastro-esophageal reflux disease without esophagitis: Secondary | ICD-10-CM | POA: Diagnosis not present

## 2017-11-19 DIAGNOSIS — Z9884 Bariatric surgery status: Secondary | ICD-10-CM | POA: Diagnosis not present

## 2017-11-19 DIAGNOSIS — E611 Iron deficiency: Secondary | ICD-10-CM | POA: Diagnosis not present

## 2017-11-19 DIAGNOSIS — Z823 Family history of stroke: Secondary | ICD-10-CM | POA: Insufficient documentation

## 2017-11-19 DIAGNOSIS — Z79899 Other long term (current) drug therapy: Secondary | ICD-10-CM | POA: Insufficient documentation

## 2017-11-19 DIAGNOSIS — Z794 Long term (current) use of insulin: Secondary | ICD-10-CM | POA: Diagnosis not present

## 2017-11-19 DIAGNOSIS — Z7982 Long term (current) use of aspirin: Secondary | ICD-10-CM | POA: Diagnosis not present

## 2017-11-19 DIAGNOSIS — I110 Hypertensive heart disease with heart failure: Secondary | ICD-10-CM | POA: Insufficient documentation

## 2017-11-19 DIAGNOSIS — Z95810 Presence of automatic (implantable) cardiac defibrillator: Secondary | ICD-10-CM | POA: Insufficient documentation

## 2017-11-19 DIAGNOSIS — I10 Essential (primary) hypertension: Secondary | ICD-10-CM

## 2017-11-19 DIAGNOSIS — I5042 Chronic combined systolic (congestive) and diastolic (congestive) heart failure: Secondary | ICD-10-CM | POA: Insufficient documentation

## 2017-11-19 DIAGNOSIS — M7582 Other shoulder lesions, left shoulder: Secondary | ICD-10-CM

## 2017-11-19 DIAGNOSIS — I5022 Chronic systolic (congestive) heart failure: Secondary | ICD-10-CM

## 2017-11-19 DIAGNOSIS — Z888 Allergy status to other drugs, medicaments and biological substances status: Secondary | ICD-10-CM | POA: Diagnosis not present

## 2017-11-19 DIAGNOSIS — Z9889 Other specified postprocedural states: Secondary | ICD-10-CM | POA: Diagnosis not present

## 2017-11-19 DIAGNOSIS — R0602 Shortness of breath: Secondary | ICD-10-CM | POA: Diagnosis present

## 2017-11-19 LAB — GLUCOSE, CAPILLARY: Glucose-Capillary: 153 mg/dL — ABNORMAL HIGH (ref 65–99)

## 2017-11-19 NOTE — Patient Instructions (Signed)
Continue weighing daily and call for an overnight weight gain of > 2 pounds or a weekly weight gain of >5 pounds. 

## 2017-11-21 DIAGNOSIS — Z9889 Other specified postprocedural states: Secondary | ICD-10-CM | POA: Diagnosis not present

## 2017-11-26 DIAGNOSIS — Z9889 Other specified postprocedural states: Secondary | ICD-10-CM | POA: Diagnosis not present

## 2017-11-27 LAB — HM DIABETES EYE EXAM

## 2017-11-28 DIAGNOSIS — Z9889 Other specified postprocedural states: Secondary | ICD-10-CM | POA: Diagnosis not present

## 2017-12-03 DIAGNOSIS — Z9889 Other specified postprocedural states: Secondary | ICD-10-CM | POA: Diagnosis not present

## 2017-12-05 DIAGNOSIS — Z9889 Other specified postprocedural states: Secondary | ICD-10-CM | POA: Diagnosis not present

## 2017-12-10 DIAGNOSIS — Z9889 Other specified postprocedural states: Secondary | ICD-10-CM | POA: Diagnosis not present

## 2017-12-12 DIAGNOSIS — Z9889 Other specified postprocedural states: Secondary | ICD-10-CM | POA: Diagnosis not present

## 2017-12-16 ENCOUNTER — Telehealth: Payer: Self-pay | Admitting: Family Medicine

## 2017-12-17 DIAGNOSIS — Z9889 Other specified postprocedural states: Secondary | ICD-10-CM | POA: Diagnosis not present

## 2017-12-17 NOTE — Telephone Encounter (Signed)
Please call and schedule office visit with Dr. [name] to follow up on his diabetes with fasting labs prior.  Please route this message back to me once he is scheduled.

## 2017-12-17 NOTE — Telephone Encounter (Signed)
Last office visit 12/27/2016.  AVS states to follow up in 2 months.  No future visits.  Refill?

## 2017-12-17 NOTE — Telephone Encounter (Signed)
Needs OV for DM follow up.Philip Ponce once appt made.

## 2017-12-17 NOTE — Telephone Encounter (Signed)
Spoke with pt he stated he would have to call back to schedule he when he has his calendar

## 2017-12-18 ENCOUNTER — Other Ambulatory Visit: Payer: Self-pay | Admitting: Family

## 2017-12-18 MED ORDER — CARVEDILOL 12.5 MG PO TABS: 12.5000 mg | ORAL_TABLET | Freq: Two times a day (BID) | ORAL | 3 refills | Status: DC

## 2017-12-18 MED ORDER — METFORMIN HCL 500 MG PO TABS: 1000.0000 mg | ORAL_TABLET | Freq: Two times a day (BID) | ORAL | 0 refills | Status: DC

## 2017-12-18 NOTE — Addendum Note (Signed)
Addended by: Carter Kitten on: 12/18/2017 09:10 AM   Modules accepted: Orders

## 2017-12-18 NOTE — Telephone Encounter (Signed)
Appointment 4/2 Pt needs metformin he is out

## 2017-12-19 DIAGNOSIS — Z9889 Other specified postprocedural states: Secondary | ICD-10-CM | POA: Diagnosis not present

## 2017-12-24 ENCOUNTER — Other Ambulatory Visit: Payer: Self-pay

## 2017-12-24 ENCOUNTER — Ambulatory Visit (INDEPENDENT_AMBULATORY_CARE_PROVIDER_SITE_OTHER): Payer: 59 | Admitting: Family Medicine

## 2017-12-24 ENCOUNTER — Encounter: Payer: Self-pay | Admitting: Family Medicine

## 2017-12-24 VITALS — BP 130/82 | HR 82 | Temp 98.9°F | Ht 73.0 in | Wt 189.5 lb

## 2017-12-24 DIAGNOSIS — I5022 Chronic systolic (congestive) heart failure: Secondary | ICD-10-CM

## 2017-12-24 DIAGNOSIS — Z125 Encounter for screening for malignant neoplasm of prostate: Secondary | ICD-10-CM | POA: Diagnosis not present

## 2017-12-24 DIAGNOSIS — E7800 Pure hypercholesterolemia, unspecified: Secondary | ICD-10-CM | POA: Diagnosis not present

## 2017-12-24 DIAGNOSIS — Z23 Encounter for immunization: Secondary | ICD-10-CM

## 2017-12-24 DIAGNOSIS — I428 Other cardiomyopathies: Secondary | ICD-10-CM

## 2017-12-24 DIAGNOSIS — I10 Essential (primary) hypertension: Secondary | ICD-10-CM | POA: Diagnosis not present

## 2017-12-24 DIAGNOSIS — E119 Type 2 diabetes mellitus without complications: Secondary | ICD-10-CM

## 2017-12-24 LAB — POCT GLYCOSYLATED HEMOGLOBIN (HGB A1C): Hemoglobin A1C: 6.8

## 2017-12-24 LAB — HM DIABETES FOOT EXAM

## 2017-12-24 NOTE — Assessment & Plan Note (Signed)
Followed by Cards. On ARB, ACEI BBlocker

## 2017-12-24 NOTE — Assessment & Plan Note (Addendum)
A1C in office today: 6.8 Encouraged low carb diet, healthy eating habits.

## 2017-12-24 NOTE — Patient Instructions (Addendum)
Return for fasting labs.  Continue working low carb diet.  Check fasting blood sugar daily.   Continue rehab and continue to work on increasing exercise as tolerated.

## 2017-12-24 NOTE — Assessment & Plan Note (Signed)
Due for re-eval. 

## 2017-12-24 NOTE — Assessment & Plan Note (Signed)
Well controlled. Continue current medication.  

## 2017-12-24 NOTE — Assessment & Plan Note (Signed)
Upcoming ECHo to reassess function. Euvolemic today.

## 2017-12-24 NOTE — Progress Notes (Signed)
   Subjective:    Patient ID: Philip Ponce, male    DOB: Aug 08, 1971, 47 y.o.   MRN: [id number]  HPI    47 year old male presents for follow up DM.  S/P recent arthroscopy of left shoulder 09/2017, 10/2017 following complete tear. Dr. Posey Pronto Currently in rehab  s/p cervical disc arthroplasty anterior Dr. Izora Ribas 06/2017  Diabetes:   Due for re-eval.  Now on metformin max, tuojeo 15 units daily Lab Results  Component Value Date   HGBA1C 6.9 (H) 07/18/2017  Using medications without difficulties: Hypoglycemic episodes: none Hyperglycemic episodes: none Feet problems:no ulcers Blood Sugars averaging: not checking lately eye exam within last year: per pt recent.. Will get records  Hx of chronic systolic heart failure followed by Harrell Gave End   Has upcoming ECHO. 09/2017 cardioverter/defib placed. Dr. Charlaine Dalton HTN control on Entresto and coreg. Blood pressure 130/82, pulse 82, temperature 98.9 F (37.2 C), temperature source Oral, height 6\' 1"  (1.854 m), weight 189 lb 8 oz (86 kg).  Review of Systems  Constitutional: Positive for fatigue. Negative for fever.  HENT: Negative for ear pain.   Eyes: Negative for pain.  Respiratory: Positive for shortness of breath. Negative for cough.   Cardiovascular: Negative for chest pain, palpitations and leg swelling.  Gastrointestinal: Negative for abdominal pain.  Genitourinary: Negative for dysuria.  Musculoskeletal: Negative for arthralgias.  Neurological: Negative for syncope, light-headedness and headaches.  Psychiatric/Behavioral: Negative for dysphoric mood.       Objective:   Physical Exam  Constitutional: Vital signs are normal. He appears well-developed and well-nourished.  HENT:  Head: Normocephalic.  Right Ear: Hearing normal.  Left Ear: Hearing normal.  Nose: Nose normal.  Mouth/Throat: Oropharynx is clear and moist and mucous membranes are normal.  Neck: Trachea normal. Carotid bruit is not present. No  thyroid mass and no thyromegaly present.  Cardiovascular: Normal rate, regular rhythm and normal pulses. Exam reveals no gallop, no distant heart sounds and no friction rub.  No murmur heard. No peripheral edema  Pulmonary/Chest: Effort normal and breath sounds normal. No respiratory distress.  Skin: Skin is warm, dry and intact. No rash noted.  Psychiatric: He has a normal mood and affect. His speech is normal and behavior is normal. Thought content normal.      Diabetic foot exam: Normal inspection No skin breakdown No calluses  Normal DP pulses Normal sensation to light touch and monofilament Nails normal     Assessment & Plan:

## 2017-12-25 DIAGNOSIS — Z9889 Other specified postprocedural states: Secondary | ICD-10-CM | POA: Diagnosis not present

## 2017-12-26 ENCOUNTER — Telehealth: Payer: Self-pay

## 2017-12-26 NOTE — Telephone Encounter (Signed)
Philip Ponce contacted the office with concern that Philip Ponce was experiencing side effects of a  Pneumonia vaccine he received from his PCP on Tuesday. He thought he had accidentally been given two doses as he believed our office had given him this vaccine earlier this year.    I advised Ms. Kaster to contact the PCP's office to ensure that it was just a side effect of the vaccine and also reassured her that we do not give vaccines in our office and his record does not indicate the he had received any other pneumonia vaccine since 2014.   She is going to follow up with the PCP.

## 2017-12-30 DIAGNOSIS — Z9889 Other specified postprocedural states: Secondary | ICD-10-CM | POA: Diagnosis not present

## 2017-12-31 ENCOUNTER — Ambulatory Visit (INDEPENDENT_AMBULATORY_CARE_PROVIDER_SITE_OTHER): Payer: 59 | Admitting: *Deleted

## 2017-12-31 DIAGNOSIS — I428 Other cardiomyopathies: Secondary | ICD-10-CM

## 2017-12-31 DIAGNOSIS — I5022 Chronic systolic (congestive) heart failure: Secondary | ICD-10-CM

## 2017-12-31 NOTE — Progress Notes (Signed)
Remote ICD transmission.   

## 2018-01-01 ENCOUNTER — Other Ambulatory Visit: Payer: Self-pay | Admitting: Internal Medicine

## 2018-01-01 DIAGNOSIS — I5022 Chronic systolic (congestive) heart failure: Secondary | ICD-10-CM

## 2018-01-02 ENCOUNTER — Encounter: Payer: Self-pay | Admitting: Cardiology

## 2018-01-02 DIAGNOSIS — Z9889 Other specified postprocedural states: Secondary | ICD-10-CM | POA: Diagnosis not present

## 2018-01-06 ENCOUNTER — Other Ambulatory Visit: Payer: Self-pay

## 2018-01-06 ENCOUNTER — Ambulatory Visit (INDEPENDENT_AMBULATORY_CARE_PROVIDER_SITE_OTHER): Payer: 59

## 2018-01-06 DIAGNOSIS — I5022 Chronic systolic (congestive) heart failure: Secondary | ICD-10-CM

## 2018-01-07 ENCOUNTER — Other Ambulatory Visit: Payer: 59

## 2018-01-07 IMAGING — CT CT SHOULDER*L* W/CM
3 series · 13 of 20 positions shown, 15 images · non-contrast
Comparison: None.

CLINICAL DATA: Chronic left shoulder pain.

EXAM:
CT ARTHROGRAPHY OF THE LEFT SHOULDER
TECHNIQUE: Multidetector CT imaging was performed following the standard
protocol after injection of dilute contrast into the joint.

[Series 9: ax st · axial · 0.39mm/px · z∈[-184,-87]mm · 3 of 99 slices shown]
[im 25/99  bone]
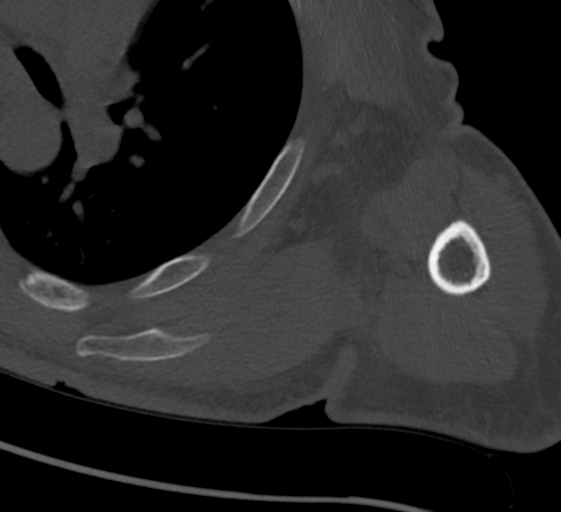
[im 50/99  bone]
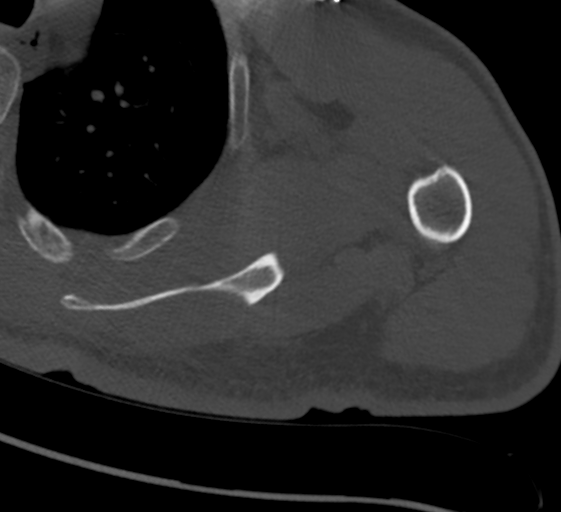
[im 74/99  bone]
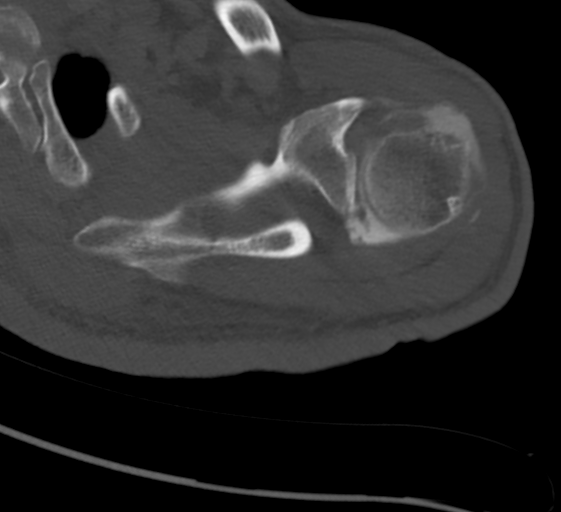

[Series 10: cor st · coronal · 0.50mm/px · 3 of 85 slices shown]
[im 19/85  bone]
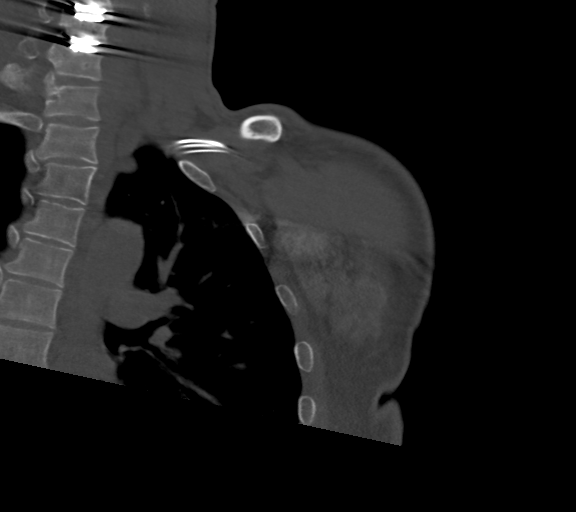
[im 35/85  bone]
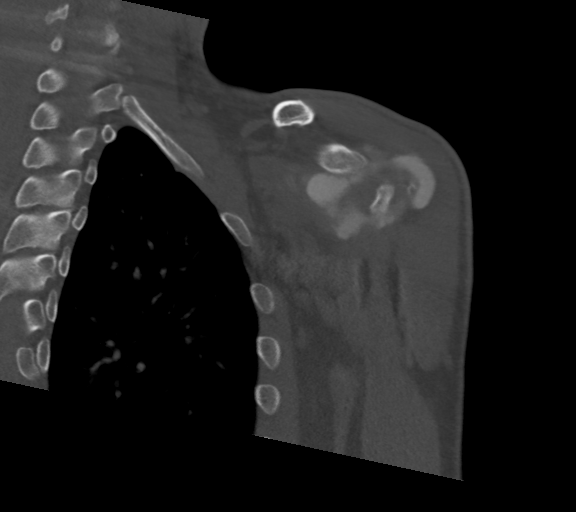
[im 50/85  bone]
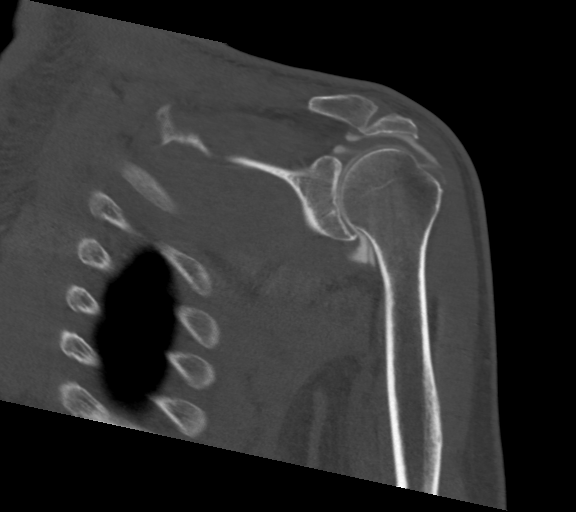

[Series 17: aber ax st · axial · 0.51mm/px · z∈[-276,-73]mm · 7 of 159 slices shown, 9 images]
[im 20/159  soft-tissue]
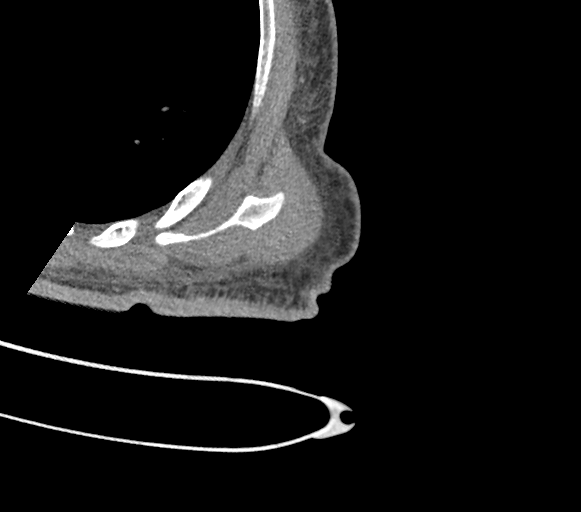
[im 20/159  bone]
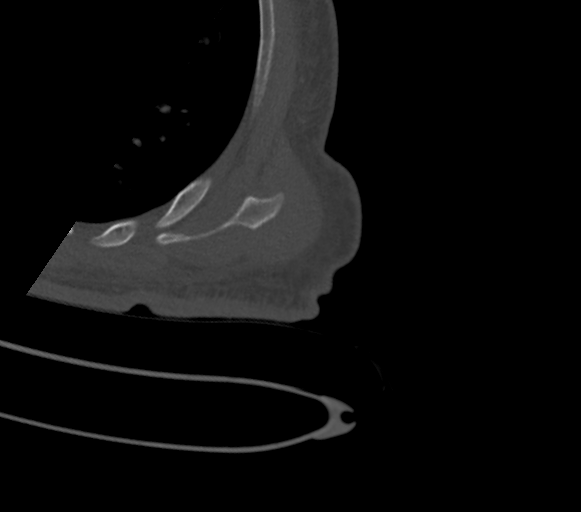
[im 40/159  bone]
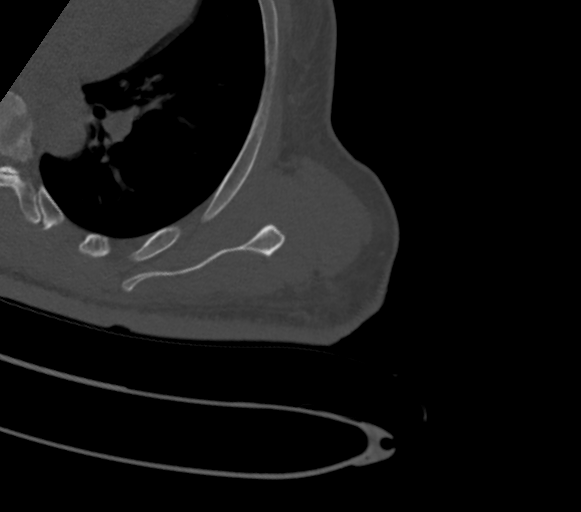
[im 60/159  bone]
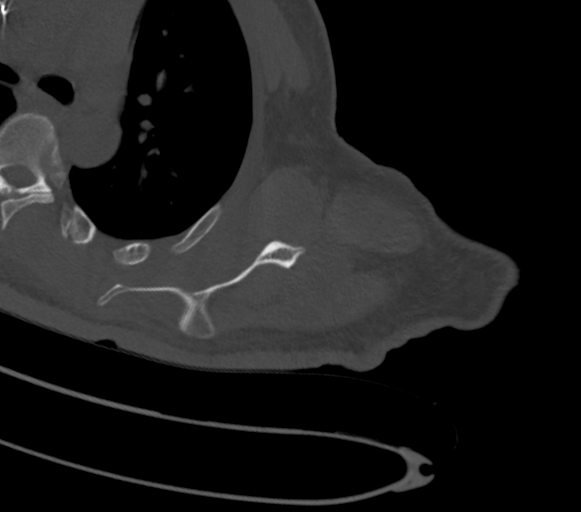
[im 80/159  bone]
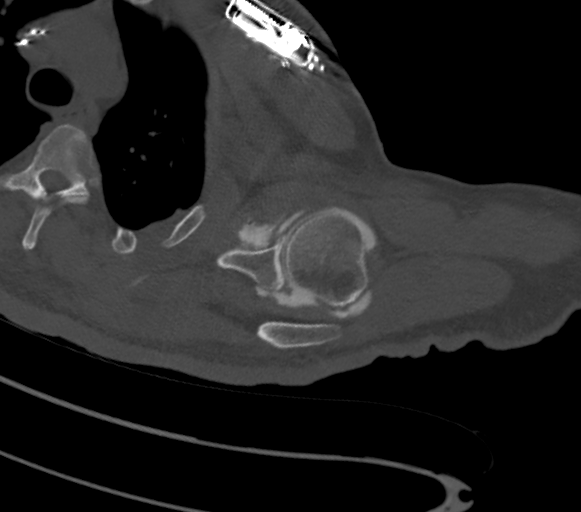
[im 99/159  soft-tissue]
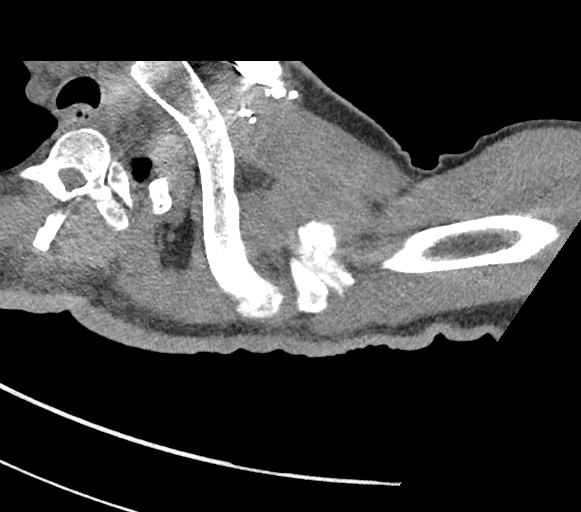
[im 99/159  bone]
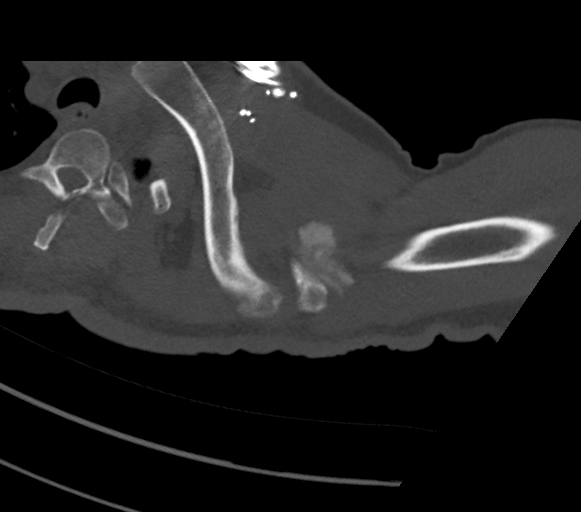
[im 119/159  bone]
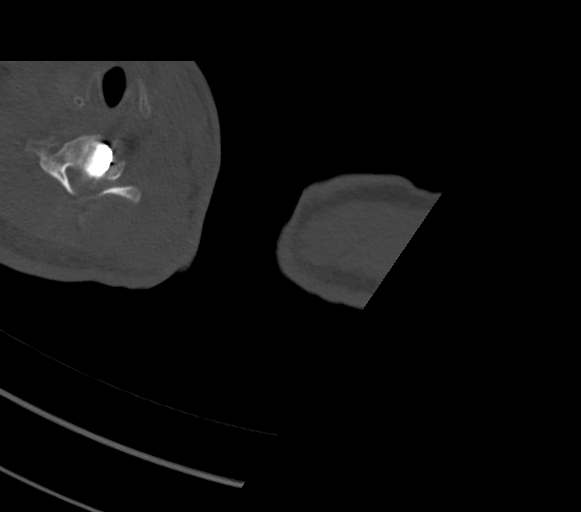
[im 139/159  bone]
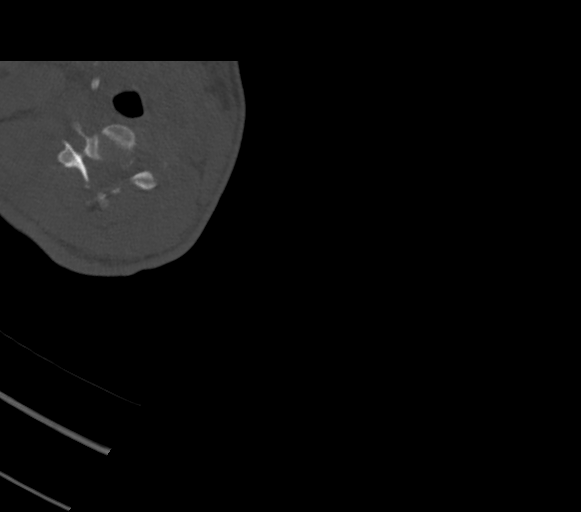

[13 of 20 positions shown; findings below may reference images not displayed]

FINDINGS: Rotator cuff: Full-thickness tear of the supraspinatus tendon with
the 16 mm of retraction. Infraspinatus tendon is intact. Teres minor
tendon is intact. Subscapularis tendon is intact.

Muscles: No muscle atrophy.

Biceps Long Head: Intact.

Acromioclavicular Joint: Mild arthropathy of the acromioclavicular
joint. Type II acromion.

Glenohumeral Joint: Intraarticular contrast distending the joint
capsule. Mild generalized chondral thinning.

Labrum: Degeneration of the superior posterior labrum. No discrete
labral tear.

Bone: Subcortical cystic changes in the lesser tuberosity as can be
seen with subcoracoid impingement. No acute fracture or dislocation.
No lytic or sclerotic osseous lesion.
IMPRESSION: 1. Full-thickness tear of the supraspinatus tendon with the 16 mm of
retraction.
2. Subcortical cystic changes in the lesser tuberosity as can be
seen with subcoracoid impingement.

## 2018-01-09 DIAGNOSIS — Z9889 Other specified postprocedural states: Secondary | ICD-10-CM | POA: Diagnosis not present

## 2018-01-11 ENCOUNTER — Other Ambulatory Visit: Payer: Self-pay | Admitting: Family Medicine

## 2018-01-13 DIAGNOSIS — Z9889 Other specified postprocedural states: Secondary | ICD-10-CM | POA: Diagnosis not present

## 2018-01-15 ENCOUNTER — Encounter: Payer: Self-pay | Admitting: Internal Medicine

## 2018-01-15 ENCOUNTER — Ambulatory Visit (INDEPENDENT_AMBULATORY_CARE_PROVIDER_SITE_OTHER): Payer: 59 | Admitting: Internal Medicine

## 2018-01-15 VITALS — BP 140/90 | HR 85 | Ht 73.0 in | Wt 186.8 lb

## 2018-01-15 DIAGNOSIS — I10 Essential (primary) hypertension: Secondary | ICD-10-CM | POA: Diagnosis not present

## 2018-01-15 DIAGNOSIS — Z950 Presence of cardiac pacemaker: Secondary | ICD-10-CM | POA: Diagnosis not present

## 2018-01-15 DIAGNOSIS — I5022 Chronic systolic (congestive) heart failure: Secondary | ICD-10-CM

## 2018-01-15 DIAGNOSIS — I428 Other cardiomyopathies: Secondary | ICD-10-CM

## 2018-01-15 DIAGNOSIS — Z79899 Other long term (current) drug therapy: Secondary | ICD-10-CM

## 2018-01-15 MED ORDER — SPIRONOLACTONE 25 MG PO TABS: 25.0000 mg | ORAL_TABLET | Freq: Every day | ORAL | 3 refills | Status: DC

## 2018-01-15 NOTE — Progress Notes (Signed)
Follow-up Outpatient Visit Date: 01/15/2018  Primary Care Provider: [name], MD [place] [address]  Chief Complaint: Fatigue  HPI:  Philip Ponce is a 47 y.o. year-old male with history of systolic heart failure secondary to nonischemic cardiomyopathy, hypertension, and type 2 diabetes mellitus, who presents for follow-up of heart failure.  I last saw Philip Ponce in January, at which time Philip Ponce reported improved exertional dyspnea following BiV/ICD placement in 06/2017.  Philip Ponce was still experiencing intermittent chest wall twitches thought to be due to extracardiac stimulation from Philip Ponce LV lead.  We agreed to increase Entresto at that visit.  Echocardiogram earlier this month showed slight improvement in LVEF, still significantly reduced at 30-35%.  Today, Philip Ponce reports feeling about the same as at her last visit.  Philip Ponce is still struggling with some discomfort and limited range of motion involving the left shoulder following surgery.  Philip Ponce also notes occasional twitches and pinches of discomfort in the left lateral chest when lying in bed on Philip Ponce left side.  Philip Ponce has had issues with extracardiac stimulation from the LV lead in the past, though this could not be replicated with device interrogation in the office shortly after the pacemaker was placed.  Since her last visit, the device clinic attempted to adjust Philip Ponce settings remotely, though Philip Ponce noticed only marginal improvement.  Philip Ponce has not had any chest pain.  Philip Ponce has stable exertional dyspnea when walking up an incline.  Philip Ponce is still not able to walk a full lap around the track (1/4 mile).  After doing strenuous activities outside, Philip Ponce will need to lie down and take a nap.  Philip Ponce reports a remote history of sleep apnea, though this resolved after weight loss in the past.  Philip Ponce denies chest pain, palpitations, lightheadedness, orthopnea, and edema.  Philip Ponce is compliant with Philip Ponce  medications.  --------------------------------------------------------------------------------------------------  Cardiovascular History & Procedures: Cardiovascular Problems:  Chronic systolic heart failure secondary to nonischemic cardiomyopathy  Risk Factors:  Hypertension, diabetes mellitus, and male gender  Cath/PCI:  LHC (12/04/16): No angiographically significant CAD. Moderate to severe LV contractile dysfunction (LVEF 30-35%) with mildly elevated left ventricular filling pressure.  LHC (09/28/13, [facility]): No significant CAD. LVEF 45%.  CV Surgery:  None  EP Procedures and Devices:  BiV-ICD placement (06/26/17, Dr. Curt Bears): [employer]  Non-Invasive Evaluation(s):  Limited TTE (01/06/18): Mildly dilated LV with normal wall thickness.  LVEF 30-35% with grade 1 diastolic dysfunction and global hypokinesis.  TTE (05/28/17): Moderately dilated LV with normal wall thickness. LVEF 20-25% with diffuse hypokinesis and grade 2 diastolic dysfunction. Normal RV size and function.  TTE (12/05/16): Moderately dilated left ventricle with LVEF of 20-25% and global hypokinesis. Grade 1 diastolic dysfunction. Mild MR. Mild left atrial enlargement. Normal RV size and function.  TTE (04/14/15): Normal LV size with mild LVH. LVEF 50-55% with normal wall motion. Grade 1 diastolic dysfunction. Mild left atrial enlargement. Normal RV size and function. No significant valvular abnormalities.  Recent CV Pertinent Labs: Lab Results  Component Value Date   CHOL 121 10/07/2015   HDL 66.70 10/07/2015   LDLCALC 39 10/07/2015   TRIG 78.0 10/07/2015   CHOLHDL 2 10/07/2015   INR 1.11 07/18/2017   INR 1.0 08/04/2012   K 3.9 10/05/2017   K 3.9 08/04/2012   MG 1.9 12/06/2016   MG 2.0 08/04/2012   BUN 12 10/05/2017   BUN 16 06/17/2017   BUN 21 (H) 08/04/2012   CREATININE 0.86 10/05/2017   CREATININE  0.98 11/23/2016    Past medical and surgical history were reviewed and updated in EPIC.  Current  Meds  Medication Sig  . acetaminophen (TYLENOL) 500 MG tablet Take 500 mg by mouth every 6 (six) hours as needed.  . calcium carbonate (OS-CAL) 600 MG TABS tablet Take 1,200 mg by mouth daily with breakfast.   . carvedilol (COREG) 12.5 MG tablet Take 1 tablet (12.5 mg total) by mouth 2 (two) times daily.  Marland Kitchen glucose blood (ONE TOUCH ULTRA TEST) test strip Check blood sugar once daily and as instructed. Dx E11.9  . Insulin Glargine (TOUJEO SOLOSTAR) 300 UNIT/ML SOPN Inject 15 Units into the skin daily.  . metFORMIN (GLUCOPHAGE) 500 MG tablet TAKE 2 TABLETS BY MOUTH TWICE DAILY WITH A MEAL  . Multiple Vitamins-Minerals (ADULT GUMMY PO) Take 2 tablets by mouth daily.  . Omega-3 Fatty Acids (FISH OIL PO) Take 800 mg by mouth daily.  Marland Kitchen omeprazole (PRILOSEC) 40 MG capsule Take 40 mg by mouth daily as needed (indigestion).   . sacubitril-valsartan (ENTRESTO) 97-103 MG Take 1 tablet by mouth 2 (two) times daily.    Allergies: Mobic [meloxicam]; Tramadol; and Diclofenac  Social History   Tobacco Use  . Smoking status: Never Smoker  . Smokeless tobacco: Never Used  Substance Use Topics  . Alcohol use: No  . Drug use: No    Family History  Problem Relation Age of Onset  . Diabetes Mother   . Hypertension Mother   . Cancer Mother        breast  . Diabetes Father   . Hypertension Father   . Cancer Father        colon  . Cancer Maternal Grandfather        prostate  . Stroke Maternal Grandfather        CVA    Review of Systems: A 12-system review of systems was performed and was negative except as noted in the HPI.  --------------------------------------------------------------------------------------------------  Physical Exam: BP 140/90 (BP Location: Left Arm, Patient Position: Sitting, Cuff Size: Normal)   Pulse 85   Ht 6\' 1"  (1.854 m)   Wt 186 lb 12 oz (84.7 kg)   BMI 24.64 kg/m   General: NAD. HEENT: No conjunctival pallor or scleral icterus. Moist mucous membranes.  OP  clear. Neck: Supple without lymphadenopathy, thyromegaly, JVD, or HJR. Lungs: Normal work of breathing. Clear to auscultation bilaterally without wheezes or crackles. Heart: Regular rate and rhythm without murmurs, rubs, or gallops. Non-displaced PMI. Abd: Bowel sounds present. Soft, NT/ND without hepatosplenomegaly Ext: No lower extremity edema. Radial, PT, and DP pulses are 2+ bilaterally. Skin: Warm and dry without rash.  EKG: Atrially sensed, ventricularly paced rhythm.  Lab Results  Component Value Date   WBC 7.2 10/05/2017   HGB 12.2 (L) 10/05/2017   HCT 37.2 (L) 10/05/2017   MCV 87.9 10/05/2017   PLT 167 10/05/2017    Lab Results  Component Value Date   NA 134 (L) 10/05/2017   K 3.9 10/05/2017   CL 103 10/05/2017   CO2 25 10/05/2017   BUN 12 10/05/2017   CREATININE 0.86 10/05/2017   GLUCOSE 202 (H) 10/05/2017   ALT 15 01/03/2016    Lab Results  Component Value Date   CHOL 121 10/07/2015   HDL 66.70 10/07/2015   LDLCALC 39 10/07/2015   TRIG 78.0 10/07/2015   CHOLHDL 2 10/07/2015    --------------------------------------------------------------------------------------------------  ASSESSMENT AND PLAN: Chronic systolic heart failure secondary to nonischemic cardiomyopathy Philip Ponce appears  euvolemic on exam today.  Philip Ponce still has quite a bit of fatigue and exertional shortness of breath consistent with NYHA class II-III heart failure.  Philip Ponce does not feel that Philip Ponce will be able to go back to Philip Ponce strenuous job and is currently on long-term disability.  We have agreed to restart spironolactone, which seems to have been held at some point in the past though the reasoning behind this is unclear.  It may have fallen off Philip Ponce medication list after being discharged following Philip Ponce BiV/ICD placement.  Philip Ponce does not recall any side effects from Philip Ponce medications.  We will check a basic metabolic panel today and again in 1 week, with addition of spironolactone 25 mg daily today.  We will  continue current doses of carvedilol and Entresto.  Status post by BiV/ICD Due to concern for extracardiac stimulation, I will have Philip Ponce follow-up in the device clinic in [facility] for further evaluation.  Hypertension Blood pressure borderline elevated today.  As above, we will add spironolactone 25 mg daily with BMP today and again in 1 week.  Follow-up: Return to clinic in 3 months.  [name], MD 01/15/2018 10:13 AM

## 2018-01-15 NOTE — Patient Instructions (Signed)
Medication Instructions:  Your physician has recommended you make the following change in your medication:  1- START Spironolactone 25 mg (1 tablet) by mouth once a day.   Labwork: Your physician recommends that you return for lab work in: [place] (BMET).  Your physician recommends that you return for lab work in: [place], 2019. - Please go to the [facility]. You will check in at the front desk to the right as you walk into the atrium. Valet Parking is offered if needed.    Testing/Procedures: none  Follow-Up: Your physician recommends that you schedule a follow-up appointment in: AT [place].  Your physician recommends that you schedule a follow-up appointment in: 3 MONTHS WITH DR END.   If you need a refill on your cardiac medications before your next appointment, please call your pharmacy.

## 2018-01-16 DIAGNOSIS — Z9889 Other specified postprocedural states: Secondary | ICD-10-CM | POA: Diagnosis not present

## 2018-01-16 LAB — BASIC METABOLIC PANEL WITH GFR
BUN/Creatinine Ratio: 15 (ref 9–20)
BUN: 12 mg/dL (ref 6–24)
CO2: 25 mmol/L (ref 20–29)
Calcium: 9 mg/dL (ref 8.7–10.2)
Chloride: 106 mmol/L (ref 96–106)
Creatinine, Ser: 0.81 mg/dL (ref 0.76–1.27)
GFR calc Af Amer: 121 mL/min/1.73
GFR calc non Af Amer: 105 mL/min/1.73
Glucose: 191 mg/dL — ABNORMAL HIGH (ref 65–99)
Potassium: 4.2 mmol/L (ref 3.5–5.2)
Sodium: 141 mmol/L (ref 134–144)

## 2018-01-20 DIAGNOSIS — Z9889 Other specified postprocedural states: Secondary | ICD-10-CM | POA: Diagnosis not present

## 2018-01-21 LAB — CUP PACEART REMOTE DEVICE CHECK
Brady Statistic AP VP Percent: 0.03 %
Brady Statistic AP VS Percent: 0.01 %
Brady Statistic RV Percent Paced: 40 %
Date Time Interrogation Session: 20190409052406
HIGH POWER IMPEDANCE MEASURED VALUE: 50 Ohm
Implantable Lead Implant Date: 20181003
Implantable Lead Location: 753858
Implantable Lead Location: 753859
Implantable Lead Model: 4398
Implantable Pulse Generator Implant Date: 20181003
Lead Channel Impedance Value: 166.25 Ohm
Lead Channel Impedance Value: 166.25 Ohm
Lead Channel Impedance Value: 199.5 Ohm
Lead Channel Impedance Value: 285 Ohm
Lead Channel Impedance Value: 418 Ohm
Lead Channel Impedance Value: 551 Ohm
Lead Channel Impedance Value: 589 Ohm
Lead Channel Impedance Value: 665 Ohm
Lead Channel Pacing Threshold Amplitude: 0.5 V
Lead Channel Pacing Threshold Amplitude: 0.875 V
Lead Channel Sensing Intrinsic Amplitude: 8.125 mV
Lead Channel Setting Pacing Amplitude: 1.5 V
Lead Channel Setting Pacing Amplitude: 2.5 V
Lead Channel Setting Pacing Pulse Width: 0.4 ms
Lead Channel Setting Sensing Sensitivity: 0.3 mV
MDC IDC LEAD IMPLANT DT: 20181003
MDC IDC LEAD IMPLANT DT: 20181003
MDC IDC LEAD LOCATION: 753860
MDC IDC MSMT BATTERY REMAINING LONGEVITY: 104 mo
MDC IDC MSMT BATTERY VOLTAGE: 3.02 V
MDC IDC MSMT LEADCHNL LV IMPEDANCE VALUE: 169.459
MDC IDC MSMT LEADCHNL LV IMPEDANCE VALUE: 204.14 Ohm
MDC IDC MSMT LEADCHNL LV IMPEDANCE VALUE: 285 Ohm
MDC IDC MSMT LEADCHNL LV IMPEDANCE VALUE: 399 Ohm
MDC IDC MSMT LEADCHNL LV IMPEDANCE VALUE: 399 Ohm
MDC IDC MSMT LEADCHNL LV IMPEDANCE VALUE: 513 Ohm
MDC IDC MSMT LEADCHNL LV IMPEDANCE VALUE: 551 Ohm
MDC IDC MSMT LEADCHNL LV IMPEDANCE VALUE: 646 Ohm
MDC IDC MSMT LEADCHNL LV PACING THRESHOLD AMPLITUDE: 1 V
MDC IDC MSMT LEADCHNL LV PACING THRESHOLD PULSEWIDTH: 0.4 ms
MDC IDC MSMT LEADCHNL RA IMPEDANCE VALUE: 399 Ohm
MDC IDC MSMT LEADCHNL RA PACING THRESHOLD PULSEWIDTH: 0.4 ms
MDC IDC MSMT LEADCHNL RA SENSING INTR AMPL: 1.875 mV
MDC IDC MSMT LEADCHNL RA SENSING INTR AMPL: 1.875 mV
MDC IDC MSMT LEADCHNL RV IMPEDANCE VALUE: 361 Ohm
MDC IDC MSMT LEADCHNL RV PACING THRESHOLD PULSEWIDTH: 0.4 ms
MDC IDC MSMT LEADCHNL RV SENSING INTR AMPL: 8.125 mV
MDC IDC SET LEADCHNL RA PACING AMPLITUDE: 2 V
MDC IDC SET LEADCHNL RV PACING PULSEWIDTH: 0.4 ms
MDC IDC STAT BRADY AS VP PERCENT: 98.76 %
MDC IDC STAT BRADY AS VS PERCENT: 1.2 %
MDC IDC STAT BRADY RA PERCENT PACED: 0.04 %

## 2018-01-22 DIAGNOSIS — M25512 Pain in left shoulder: Secondary | ICD-10-CM | POA: Diagnosis not present

## 2018-01-24 ENCOUNTER — Telehealth: Payer: Self-pay | Admitting: Internal Medicine

## 2018-01-24 NOTE — Telephone Encounter (Signed)
Received records request Disabilty Determination Services, forwarded to [facility] for processing.(xx)

## 2018-01-28 DIAGNOSIS — Z9889 Other specified postprocedural states: Secondary | ICD-10-CM | POA: Diagnosis not present

## 2018-01-29 ENCOUNTER — Telehealth: Payer: Self-pay | Admitting: *Deleted

## 2018-01-29 NOTE — Telephone Encounter (Signed)
Sherri, Dr Elroy Channel nurse, called back. It was not planned for patient to have a BMET on Friday, but she will ask Dr Janese Banks if it is ok to draw this while patient is there. If not, she will let us know so we can have the patient go to the [facility] for lab work. I expressed my appreciation.

## 2018-01-29 NOTE — Telephone Encounter (Signed)
Results of lab work from 01/15/18 called to pt. Pt verbalized understanding. He need repeat BMET. He says he has lab work scheduled for this Friday 01/31/18 at the [facility] before seeing Dr Janese Banks.  I called over to [place] and left voicemail with his nurse to see if we can have BMET drawn as well or what labs patient may be getting to save patient multiple lab draws. Awaiting return call. See telephone note for further entries.

## 2018-01-30 ENCOUNTER — Other Ambulatory Visit: Payer: Self-pay | Admitting: *Deleted

## 2018-01-30 DIAGNOSIS — D509 Iron deficiency anemia, unspecified: Secondary | ICD-10-CM

## 2018-01-30 DIAGNOSIS — Z9889 Other specified postprocedural states: Secondary | ICD-10-CM | POA: Diagnosis not present

## 2018-01-30 NOTE — Telephone Encounter (Signed)
Patient getting lab drawn at cancer center tomorrow .

## 2018-01-30 NOTE — Telephone Encounter (Signed)
To Jennifer, RN as an FYI. 

## 2018-01-31 ENCOUNTER — Other Ambulatory Visit: Payer: Self-pay

## 2018-01-31 ENCOUNTER — Inpatient Hospital Stay: Payer: 59

## 2018-01-31 ENCOUNTER — Other Ambulatory Visit: Payer: Self-pay | Admitting: Oncology

## 2018-01-31 ENCOUNTER — Inpatient Hospital Stay: Payer: 59 | Attending: Oncology | Admitting: Oncology

## 2018-01-31 VITALS — BP 132/76 | Temp 96.8°F | Resp 18 | Ht 73.0 in | Wt 184.0 lb

## 2018-01-31 DIAGNOSIS — Z800 Family history of malignant neoplasm of digestive organs: Secondary | ICD-10-CM | POA: Diagnosis not present

## 2018-01-31 DIAGNOSIS — E119 Type 2 diabetes mellitus without complications: Secondary | ICD-10-CM | POA: Diagnosis not present

## 2018-01-31 DIAGNOSIS — R5382 Chronic fatigue, unspecified: Secondary | ICD-10-CM | POA: Diagnosis not present

## 2018-01-31 DIAGNOSIS — D508 Other iron deficiency anemias: Secondary | ICD-10-CM | POA: Insufficient documentation

## 2018-01-31 DIAGNOSIS — R0602 Shortness of breath: Secondary | ICD-10-CM | POA: Diagnosis not present

## 2018-01-31 DIAGNOSIS — I10 Essential (primary) hypertension: Secondary | ICD-10-CM

## 2018-01-31 DIAGNOSIS — D509 Iron deficiency anemia, unspecified: Secondary | ICD-10-CM

## 2018-01-31 DIAGNOSIS — Z9884 Bariatric surgery status: Secondary | ICD-10-CM

## 2018-01-31 LAB — BASIC METABOLIC PANEL
Anion gap: 10 (ref 5–15)
BUN: 21 mg/dL — ABNORMAL HIGH (ref 6–20)
CALCIUM: 9.2 mg/dL (ref 8.9–10.3)
CO2: 25 mmol/L (ref 22–32)
Chloride: 103 mmol/L (ref 101–111)
Creatinine, Ser: 1.02 mg/dL (ref 0.61–1.24)
GFR calc Af Amer: >60 mL/min (ref >60)
GFR calc non Af Amer: >60 mL/min (ref >60)
Glucose, Bld: 167 mg/dL — ABNORMAL HIGH (ref 65–99)
Potassium: 4.3 mmol/L (ref 3.5–5.1)
Sodium: 138 mmol/L (ref 135–145)

## 2018-01-31 LAB — IRON AND TIBC
Iron: 96 ug/dL (ref 45–182)
Saturation Ratios: 33 % (ref 17.9–39.5)
TIBC: 289 ug/dL (ref 250–450)
UIBC: 193 ug/dL

## 2018-01-31 LAB — CBC WITH DIFFERENTIAL/PLATELET
Basophils Absolute: 0.1 10*3/uL (ref 0–0.1)
Basophils Relative: 1 %
EOS ABS: 0 10*3/uL (ref 0–0.7)
Eosinophils Relative: 1 %
HCT: 39.3 % — ABNORMAL LOW (ref 40.0–52.0)
HEMOGLOBIN: 12.9 g/dL — AB (ref 13.0–18.0)
LYMPHS ABS: 1.1 10*3/uL (ref 1.0–3.6)
LYMPHS PCT: 20 %
MCH: 29.1 pg (ref 26.0–34.0)
MCHC: 32.7 g/dL (ref 32.0–36.0)
MCV: 89 fL (ref 80.0–100.0)
Monocytes Absolute: 0.5 10*3/uL (ref 0.2–1.0)
Monocytes Relative: 10 %
NEUTROS PCT: 68 %
Neutro Abs: 3.8 10*3/uL (ref 1.4–6.5)
Platelets: 188 10*3/uL (ref 150–440)
RBC: 4.42 MIL/uL (ref 4.40–5.90)
RDW: 13.7 % (ref 11.5–14.5)
WBC: 5.6 10*3/uL (ref 3.8–10.6)

## 2018-01-31 LAB — FERRITIN: Ferritin: 37 ng/mL (ref 24–336)

## 2018-01-31 LAB — VITAMIN B12: VITAMIN B 12: 233 pg/mL (ref 180–914)

## 2018-01-31 NOTE — Progress Notes (Signed)
No new changes noted today 

## 2018-01-31 NOTE — Progress Notes (Signed)
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 Janese Banks on September 2018 for IDA follow-up. He denied any blood in his stool or urine.  He continued to work but feel short of breath and tired sometimes.  He denied leg swelling.  Initial work-up by [name] for symptoms of bright red blood per rectum.  He also complained of epigastric pain in the past chronically on NSAIDs which were stopped.  Father died of colon cancer.  Patient has history of gastric bypass surgery approximately 3 years ago.  Work-up included EGD (11/2016) normal.  Colonoscopy ( normal).  History of cardiomyopathy with an EF of 35%.  Cardiac cath for left bundle branch block showing no abnormalities.   Admitted to [facility] in February 2018 for episodes of melena.  Was given PPI but did not  have an EGD or colonoscopy.  Did have a completed capsule study (Negative).  To date patient has received 2 doses of IV Feraheme.  Received on 12/18/2016 and 12/25/2016.  History of heart failure secondary to nonischemic cardiomyopathy, hypertension and type 2 diabetes.  Had pacemaker/ICD placement in 06/2017.  Classified NYHA class II/III heart failure.  Recently restarted on spironolactone and continuing Coreg and Entresto.  Patient's most recent echo was from 01/06/2018 showing a EF of 30 to 35% which is improvement.  Interval history- Denies any bleeding.  Admits to chronic fatigue.  Had recent left shoulder surgery in January due to rotator cuff tear.   Recovering well.  Continues to complain to exertional shortness of breath but otherwise feels well.  ECOG PS- 1 Pain scale- 0  Review of systems- Review of Systems  Constitutional: Positive for malaise/fatigue. Negative for chills, fever and weight loss.  HENT: Negative for congestion and ear pain.   Eyes: Negative.  Negative for blurred vision and double vision.  Respiratory: Positive for shortness of breath (With exertion). Negative for cough and sputum production.   Cardiovascular: Negative.  Negative for chest pain, palpitations and leg swelling.  Gastrointestinal: Negative.  Negative for abdominal pain, constipation, diarrhea, nausea and vomiting.  Genitourinary: Negative for dysuria, frequency and urgency.  Musculoskeletal: Negative for back pain and falls.  Skin: Negative.  Negative for rash.  Neurological: Negative.  Negative for weakness and headaches.  Endo/Heme/Allergies: Negative.  Does not bruise/bleed easily.  Psychiatric/Behavioral: Negative.  Negative for depression. The patient is not nervous/anxious and does not have insomnia.       Allergies  Allergen Reactions  . Mobic [Meloxicam] Other (See Comments)    Ulcers in stomach eruption   . Tramadol     Hard to breath  . Diclofenac Palpitations and Other (See Comments)    Fast heart beat, CP,SOB and weakness on one side.     Past Medical History:  Diagnosis Date  . Acute blood loss anemia 11/20/2016  . 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 Chronic combined systolic (congestive) and diastolic (congestive) heart failure ([place])    a. 11/2016 Ehco: EF 20-25%, glob HK, antsept, ant, apical HK, Gr1 DD, mild  MR, mildly dil LA, nl RV fxn; b. 05/2017 Echo: Ef 20-25%, Gr2 DD, prominent apical trabeculations.  . Diabetes mellitus ([place])   . Family history of adverse reaction to anesthesia    mom had a hard time waking up  . GERD (gastroesophageal reflux disease)   . NICM (nonischemic  cardiomyopathy) ([place])    a. 11/2016 Cath: nl cors, EF 25-30%; b. 11/2016 Echo: EF 20-25%; c. 05/2017 Echo: EF 20-25%.  . Slipped intervertebral disc    L4 L5  . STEMI (ST elevation myocardial infarction) ([place])    a. 11/2016 ST elevation -->Nl cors on cath.     Past Surgical History:  Procedure Laterality Date  . BIV ICD INSERTION CRT-D  06/26/2017  . BIV ICD INSERTION CRT-D N/A 06/26/2017   Procedure: BIV ICD INSERTION CRT-D;  Surgeon: [name], MD;  Location: [place] [facility];  Service: Cardiovascular;  Laterality: N/A;  . CERVICAL DISC ARTHROPLASTY N/A 07/24/2017   Procedure: CERVICAL ANTERIOR [place] ARTHROPLASTY C5-C7;  Surgeon: [name], MD;  Location: [facility] ORS;  Service: Neurosurgery;  Laterality: N/A;  . COLONOSCOPY WITH PROPOFOL N/A 12/03/2016   Procedure: COLONOSCOPY WITH PROPOFOL;  Surgeon: [name], MD;  Location: [facility] ENDOSCOPY;  Service: Endoscopy;  Laterality: N/A;  . ESOPHAGOGASTRODUODENOSCOPY (EGD) WITH PROPOFOL N/A 12/03/2016   Procedure: ESOPHAGOGASTRODUODENOSCOPY (EGD) WITH PROPOFOL;  Surgeon: [name], MD;  Location: [facility] ENDOSCOPY;  Service: Endoscopy;  Laterality: N/A;  . FINGER SURGERY Right 2012   index finger  . GASTRIC BYPASS    . GIVENS CAPSULE STUDY N/A 01/07/2017   Procedure: GIVENS CAPSULE STUDY;  Surgeon: [name], MD;  Location: [facility] ENDOSCOPY;  Service: Endoscopy;  Laterality: N/A;  . LEFT HEART CATH AND CORONARY ANGIOGRAPHY N/A 12/04/2016   Procedure: Left Heart Cath and Coronary Angiography;  Surgeon: [name], MD;  Location: [place] [facility];  Service: Cardiovascular;  Laterality: N/A;  . SHOULDER ARTHROSCOPY WITH OPEN ROTATOR CUFF REPAIR Left 10/04/2017   Procedure: SHOULDER ARTHROSCOPY WITH ROTATOR CUFF REPAIR, SUBACROMIAL DECOMPRESSION,OPEN BICEP TENODESIS, EXTENSIVE DEBRIDEMENT;  Surgeon: [name], MD;  Location: [facility] ORS;  Service: Orthopedics;  Laterality: Left;    Social History   Socioeconomic History  .  Marital status: Married    Spouse name: Not on file  . Number of children: Not on file  . Years of education: Not on file  . Highest education level: Not on file  Occupational History  . Occupation: Teacher, early years/pre, Brewing technologist: [employer]  . Occupation: disability  Social Needs  . Financial resource strain: Not hard at all  . Food insecurity:    Worry: Never true    Inability: Never true  . Transportation needs:    Medical: No    Non-medical: No  Tobacco Use  . Smoking status: Never Smoker  . Smokeless tobacco: Never Used  Substance and Sexual Activity  . Alcohol use: No  . Drug use: No  . Sexual activity: Yes  Lifestyle  . Physical activity:    Days per week: 0 days    Minutes per session: Not on file  . Stress: Not at all  Relationships  . Social connections:    Talks on phone: More than three times a week    Gets together: Twice a week    Attends religious service: More than 4 times per year    Active member of club or organization: No    Attends meetings of clubs or organizations: Never  Relationship status: Married  . Intimate partner violence:    Fear of current or ex partner: No    Emotionally abused: No    Physically abused: No    Forced sexual activity: No  Other Topics Concern  . Not on file  Social History Narrative   Regular exercise-yes, walking one mile per day   Diet: fast food, diet soda, unsweeted tea    Family History  Problem Relation Age of Onset  . Diabetes Mother   . Hypertension Mother   . Cancer Mother        breast  . Diabetes Father   . Hypertension Father   . Cancer Father        colon  . Cancer Maternal Grandfather        prostate  . Stroke Maternal Grandfather        CVA     Current Outpatient Medications:  .  calcium carbonate (OS-CAL) 600 MG TABS tablet, Take 1,200 mg by mouth daily with breakfast. , Disp: , Rfl:  .  carvedilol (COREG) 12.5 MG tablet, Take 1 tablet (12.5 mg total) by mouth  2 (two) times daily., Disp: 180 tablet, Rfl: 3 .  glucose blood (ONE TOUCH ULTRA TEST) test strip, Check blood sugar once daily and as instructed. Dx E11.9, Disp: 100 each, Rfl: 1 .  Insulin Glargine (TOUJEO SOLOSTAR) 300 UNIT/ML SOPN, Inject 15 Units into the skin daily., Disp: 6 pen, Rfl: 5 .  metFORMIN (GLUCOPHAGE) 500 MG tablet, TAKE 2 TABLETS BY MOUTH TWICE DAILY WITH A MEAL, Disp: 360 tablet, Rfl: 1 .  Multiple Vitamins-Minerals (ADULT GUMMY PO), Take 2 tablets by mouth daily., Disp: , Rfl:  .  Omega-3 Fatty Acids (FISH OIL PO), Take 800 mg by mouth daily., Disp: , Rfl:  .  sacubitril-valsartan (ENTRESTO) 97-103 MG, Take 1 tablet by mouth 2 (two) times daily., Disp: 180 tablet, Rfl: 3 .  spironolactone (ALDACTONE) 25 MG tablet, Take 1 tablet (25 mg total) by mouth daily., Disp: 90 tablet, Rfl: 3 .  acetaminophen (TYLENOL) 500 MG tablet, Take 500 mg by mouth every 6 (six) hours as needed., Disp: , Rfl:  .  omeprazole (PRILOSEC) 40 MG capsule, Take 40 mg by mouth daily as needed (indigestion). , Disp: , Rfl:   Physical exam:  Vitals:   01/31/18 1128  BP: 132/76  Resp: 18  Temp: (!) 96.8 F (36 C)  TempSrc: Tympanic  SpO2: 100%  Weight: 184 lb (83.5 kg)  Height: 6\' 1"  (1.854 m)   Physical Exam  Constitutional: He is oriented to person, place, and time and well-developed, well-nourished, and in no distress. Vital signs are normal.  HENT:  Head: Normocephalic and atraumatic.  Eyes: Pupils are equal, round, and reactive to light.  Neck: Normal range of motion.  Cardiovascular: Normal rate, regular rhythm and normal heart sounds.  No murmur heard. Pulmonary/Chest: Effort normal and breath sounds normal. He has no wheezes.  Abdominal: Soft. Normal appearance and bowel sounds are normal. He exhibits no distension. There is no tenderness.  Musculoskeletal: Normal range of motion. He exhibits no edema.  Neurological: He is alert and oriented to person, place, and time. Gait normal.    Skin: Skin is warm and dry. No rash noted.  Psychiatric: Mood, memory, affect and judgment normal.     CMP Latest Ref Rng & Units 01/31/2018  Glucose 65 - 99 mg/dL 167(H)  BUN 6 - 20 mg/dL 21(H)  Creatinine 0.61 - 1.24 mg/dL 1.02  Sodium  135 - 145 mmol/L 138  Potassium 3.5 - 5.1 mmol/L 4.3  Chloride 101 - 111 mmol/L 103  CO2 22 - 32 mmol/L 25  Calcium 8.9 - 10.3 mg/dL 9.2  Total Protein 6.0 - 8.3 g/dL -  Total Bilirubin 0.2 - 1.2 mg/dL -  Alkaline Phos 39 - 117 U/L -  AST 0 - 37 U/L -  ALT 0 - 53 U/L -   CBC Latest Ref Rng & Units 01/31/2018  WBC 3.8 - 10.6 K/uL 5.6  Hemoglobin 13.0 - 18.0 g/dL 12.9(L)  Hematocrit 40.0 - 52.0 % 39.3(L)  Platelets 150 - 440 K/uL 188      Assessment and plan- Patient is a 47 year old male with iron deficiency anemia due to gastric bypass surgery.  1. Anemia- Today's hemoglobin is 12.9.  Ferritin is 37 (previously 66).  Last IV  Feraheme 510 mg was given on 12/18/2016 and 12/25/2016.  Patient is symptomatic today with shortness of breath and fatigue.  Plan to give 1 dose of IV Feraheme 510 mg.  Patient would prefer a Tuesday or Thursday a.m. if possible.  Please set patient up for 1 dose of IV Feraheme 510 mg.  Calculated iron deficit is 200 mg with a target HGB of 14 g/dL.   Vitamin B12 level normal.  Plan to repeat CBC, ferritin and iron studies in 6 months with MD assessment.   Visit Diagnosis 1. Iron deficiency anemia, unspecified iron deficiency anemia type    Greater than 50% was spent in counseling and coordination of care with this patient including but not limited to discussion of the relevant topics above (See A&P) including, but not limited to diagnosis and management of acute and chronic medical conditions.   Faythe Casa, NP 01/31/2018 2:36 PM

## 2018-02-03 ENCOUNTER — Telehealth: Payer: Self-pay | Admitting: *Deleted

## 2018-02-03 ENCOUNTER — Ambulatory Visit: Payer: 59 | Admitting: Podiatry

## 2018-02-03 DIAGNOSIS — Z9889 Other specified postprocedural states: Secondary | ICD-10-CM | POA: Diagnosis not present

## 2018-02-03 NOTE — Telephone Encounter (Signed)
Results called to pt. Pt verbalized understanding of results and to contact his PCP.

## 2018-02-03 NOTE — Telephone Encounter (Signed)
Patient had lab work on 01/31/18 through cancer center doctor. Routing to Dr End to review as he requested f/u BMET from office visit on 01/15/18.

## 2018-02-03 NOTE — Telephone Encounter (Signed)
BMP stable.  Blood glucose elevated, which [name] should discuss with his PCP.

## 2018-02-04 ENCOUNTER — Other Ambulatory Visit: Payer: Self-pay | Admitting: Oncology

## 2018-02-04 ENCOUNTER — Inpatient Hospital Stay: Payer: 59

## 2018-02-04 VITALS — BP 123/78 | HR 86 | Temp 96.7°F | Resp 20

## 2018-02-04 DIAGNOSIS — D508 Other iron deficiency anemias: Secondary | ICD-10-CM | POA: Diagnosis not present

## 2018-02-04 DIAGNOSIS — D500 Iron deficiency anemia secondary to blood loss (chronic): Secondary | ICD-10-CM

## 2018-02-04 MED ORDER — FERUMOXYTOL INJECTION 510 MG/17 ML
510.0000 mg | Freq: Once | INTRAVENOUS | Status: AC
  Administered 2018-02-04: 510 mg via INTRAVENOUS
  Filled 2018-02-04: qty 17

## 2018-02-04 MED ORDER — SODIUM CHLORIDE 0.9 % IV SOLN
Freq: Once | INTRAVENOUS | Status: AC
  Administered 2018-02-04: 14:00:00 via INTRAVENOUS
  Filled 2018-02-04: qty 1000

## 2018-02-06 DIAGNOSIS — Z9889 Other specified postprocedural states: Secondary | ICD-10-CM | POA: Diagnosis not present

## 2018-02-11 ENCOUNTER — Ambulatory Visit: Payer: 59

## 2018-02-12 DIAGNOSIS — Z9889 Other specified postprocedural states: Secondary | ICD-10-CM | POA: Diagnosis not present

## 2018-02-17 NOTE — Progress Notes (Signed)
Patient ID: Philip Ponce, male    DOB: [birth year], 47 y.o.   MRN: [id number]  HPI  Philip Ponce is a 47 yo male with PMH significant for DM, iron deficiency, HTN, gastric bypass, STEMI and CHF with reduced ejection fraction.  Echo report from 01/06/18 reviewed and showed an EF of 30-35% along with trivial Philip. Echo report from 05/28/17 reviewed and showed an EF of 20-25%. Echo report done 12/05/16 was reviewed and showed an EF of 20-25% with mild Philip. Cardiac catheterization done 12/04/16 showed an EF of 30-35% and no significant CAD. Echo on 04/14/15 showed EF of 50-55%.  Admitted 10/04/17 for left rotator cuff repair. Discharged the following day.   Presents today for a follow-up visit with a chief complaint of minimal shortness of breath upon moderate exertion. He says that this has been chronic in nature having been present for several years. He does feel like it's a little worse due to having sinus congestion. He has associated fatigue along with this. He denies any difficulty sleeping, abdominal distention, palpitations, pedal edema, chest pain, cough, dizziness or weight gain.   Past Medical History:  Diagnosis Date  . Acute blood loss anemia 11/20/2016  . 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 Chronic combined systolic (congestive) and diastolic (congestive) heart failure ([place])    a. 11/2016 Ehco: EF 20-25%, glob HK, antsept, ant, apical HK, Gr1 DD, mild Philip, mildly dil LA, nl RV fxn; b. 05/2017 Echo: Ef 20-25%, Gr2 DD, prominent apical trabeculations.  . Diabetes mellitus ([place])   . Family history of adverse reaction to anesthesia    mom had a hard time waking up  . GERD (gastroesophageal reflux disease)   . NICM (nonischemic cardiomyopathy) ([place])    a. 11/2016 Cath: nl cors, EF 25-30%; b. 11/2016 Echo: EF 20-25%; c. 05/2017 Echo: EF 20-25%.  . Slipped intervertebral disc    L4 L5  . STEMI (ST elevation myocardial infarction) ([place])    a. 11/2016 ST elevation  -->Nl cors on cath.   Past Surgical History:  Procedure Laterality Date  . BIV ICD INSERTION CRT-D  06/26/2017  . BIV ICD INSERTION CRT-D N/A 06/26/2017   Procedure: BIV ICD INSERTION CRT-D;  Surgeon: [name], MD;  Location: [place] [facility];  Service: Cardiovascular;  Laterality: N/A;  . CERVICAL DISC ARTHROPLASTY N/A 07/24/2017   Procedure: CERVICAL ANTERIOR [place] ARTHROPLASTY C5-C7;  Surgeon: [name], MD;  Location: [facility];  Service: Neurosurgery;  Laterality: N/A;  . COLONOSCOPY WITH PROPOFOL N/A 12/03/2016   Procedure: COLONOSCOPY WITH PROPOFOL;  Surgeon: [name], MD;  Location: [facility] ENDOSCOPY;  Service: Endoscopy;  Laterality: N/A;  . ESOPHAGOGASTRODUODENOSCOPY (EGD) WITH PROPOFOL N/A 12/03/2016   Procedure: ESOPHAGOGASTRODUODENOSCOPY (EGD) WITH PROPOFOL;  Surgeon: [name], MD;  Location: [facility] ENDOSCOPY;  Service: Endoscopy;  Laterality: N/A;  . FINGER SURGERY Right 2012   index finger  . GASTRIC BYPASS    . GIVENS CAPSULE STUDY N/A 01/07/2017   Procedure: GIVENS CAPSULE STUDY;  Surgeon: [name], MD;  Location[facility];  Service: Endoscopy;  Laterality: N/A;  . LEFT HEART CATH AND CORONARY ANGIOGRAPHY N/A 12/04/2016   Procedure: Left Heart Cath and Coronary Angiography;  Surgeon: [name], MD;  Location: [place] [facility];  Service: Cardiovascular;  Laterality: N/A;  . SHOULDER ARTHROSCOPY WITH OPEN ROTATOR CUFF REPAIR Left 10/04/2017   Procedure: SHOULDER ARTHROSCOPY WITH ROTATOR CUFF REPAIR, SUBACROMIAL DECOMPRESSION,OPEN BICEP TENODESIS, EXTENSIVE DEBRIDEMENT;  Surgeon: [name], MD;  Location: [facility] ORS;  Service: Orthopedics;  Laterality: Left;   Family History  Problem Relation Age of Onset  . Diabetes Mother   . Hypertension Mother   . Cancer Mother        breast  . Diabetes Father   . Hypertension Father   . Cancer Father        colon  . Cancer Maternal Grandfather        prostate  . Stroke Maternal Grandfather         CVA   Social History   Tobacco Use  . Smoking status: Never Smoker  . Smokeless tobacco: Never Used  Substance Use Topics  . Alcohol use: No   Allergies  Allergen Reactions  . Mobic [Meloxicam] Other (See Comments)    Ulcers in stomach eruption   . Tramadol     Hard to breath  . Diclofenac Palpitations and Other (See Comments)    Fast heart beat, CP,SOB and weakness on one side.   Prior to Admission medications   Medication Sig Start Date End Date Taking? Authorizing Provider  acetaminophen (TYLENOL) 500 MG tablet Take 500 mg by mouth every 6 (six) hours as needed.   Yes [provider]  calcium carbonate (OS-CAL) 600 MG TABS tablet Take 1,200 mg by mouth daily with breakfast.    Yes [provider]  carvedilol (COREG) 12.5 MG tablet Take 1 tablet (12.5 mg total) by mouth 2 (two) times daily. 12/18/17 03/18/18 Yes Hackney, Tina A, FNP  glucose blood (ONE TOUCH ULTRA TEST) test strip Check blood sugar once daily and as instructed. Dx E11.9 01/19/16  Yes Bedsole, Amy E, MD  Insulin Glargine (TOUJEO SOLOSTAR) 300 UNIT/ML SOPN Inject 15 Units into the skin daily. 01/13/18  Yes Bedsole, Amy E, MD  metFORMIN (GLUCOPHAGE) 500 MG tablet TAKE 2 TABLETS BY MOUTH TWICE DAILY WITH A MEAL 01/13/18  Yes Bedsole, Amy E, MD  Multiple Vitamins-Minerals (ADULT GUMMY PO) Take 2 tablets by mouth daily.   Yes [provider]  Omega-3 Fatty Acids (FISH OIL PO) Take 800 mg by mouth daily.   Yes [provider]  omeprazole (PRILOSEC) 40 MG capsule Take 40 mg by mouth daily as needed (indigestion).    Yes [provider]  sacubitril-valsartan (ENTRESTO) 97-103 MG Take 1 tablet by mouth 2 (two) times daily. 10/09/17  Yes End, Harrell Gave, MD  spironolactone (ALDACTONE) 25 MG tablet Take 1 tablet (25 mg total) by mouth daily. 01/15/18 04/15/18 Yes End, Harrell Gave, MD    Review of Systems  Constitutional: Positive for fatigue. Negative for appetite change.  HENT:  Positive for sinus pressure. Negative for congestion, postnasal drip and sore throat.   Eyes: Negative.   Respiratory: Positive for shortness of breath (with exertion). Negative for cough, chest tightness, wheezing and stridor.   Cardiovascular: Negative for chest pain, palpitations and leg swelling.  Gastrointestinal: Negative for abdominal distention and abdominal pain.  Endocrine: Negative.   Genitourinary: Negative.   Musculoskeletal: Negative for arthralgias, back pain, neck pain and neck stiffness.  Skin: Negative.   Allergic/Immunologic: Negative.   Neurological: Negative for dizziness and light-headedness.  Hematological: Negative for adenopathy. Does not bruise/bleed easily.  Psychiatric/Behavioral: Negative for dysphoric mood and sleep disturbance. The patient is not nervous/anxious.    Vitals:   02/18/18 0907  BP: 116/74  Pulse: 61  Resp: 18  SpO2: 100%  Weight: 177 lb 6 oz (80.5 kg)  Height: 6\' 1"  (1.854 m)   Wt Readings from Last 3  Encounters:  02/18/18 177 lb 6 oz (80.5 kg)  01/31/18 184 lb (83.5 kg)  01/15/18 186 lb 12 oz (84.7 kg)   Lab Results  Component Value Date   CREATININE 1.02 01/31/2018   CREATININE 0.81 01/15/2018   CREATININE 0.86 10/05/2017    Physical Exam  Constitutional: He is oriented to person, place, and time. He appears well-developed and well-nourished.  HENT:  Head: Normocephalic and atraumatic.  Neck: Normal range of motion. Neck supple. No JVD present.  Cardiovascular: Normal rate and regular rhythm.  Pulmonary/Chest: Effort normal. He has no wheezes. He has no rales.  Abdominal: Soft. He exhibits no distension. There is no tenderness.  Musculoskeletal: He exhibits no edema.       Left shoulder: He exhibits decreased range of motion. He exhibits no tenderness.  Neurological: He is alert and oriented to person, place, and time.  Skin: Skin is warm and dry.  Psychiatric: He has a normal mood and affect. His behavior is normal. Thought  content normal.  Nursing note and vitals reviewed.   Assessment & Plan:  1. Congestive heart failure with reduced ejection fraction - NYHA class II - euvolemic - weighing daily. Reminded to call for weight gain of > 2 lbs overnight or > 5 lbs in one week - weight down 12 pounds since he was last here February 2019 - not adding salt. Reminded to closely follow a 2000mg  sodium diet - has been walking more and can now walk 2-3 times around the track instead of walking just 1/2 around before stopping - saw cardiologist (End) 01/15/18 - no shocks from his AICD although continues to have a "quivering" sensation and will be seeing Dr. Caryl Comes soon - on max entresto dose - could consider increasing his carvedilol to 25mg  BID if his HR would allow in the future - PharmD reconciled medications with the patient  2. Diabetes - A1c on 12/24/17 was 6.8% - nonfasting glucose in clinic today was 173; admits that he doesn't check if very often at home - Saw PCP on 12/24/16 [name])  3. HTN - BP looks good today  - BMP on 01/31/18 reviewed and showed potassium 4.3, sodium 138 and GFR >60  4: Left rotator cuff repair- - repaired 10/04/17 and is still going through physical therapy  Patient did not bring his medications nor a list. Each medication was verbally reviewed with the patient and he was encouraged to bring the bottles to every visit to confirm accuracy of list.  Return in 6 months or sooner for any questions/problems before then.

## 2018-02-18 ENCOUNTER — Ambulatory Visit: Payer: 59 | Attending: Family | Admitting: Family

## 2018-02-18 ENCOUNTER — Encounter: Payer: Self-pay | Admitting: Family

## 2018-02-18 VITALS — BP 116/74 | HR 61 | Resp 18 | Ht 73.0 in | Wt 177.4 lb

## 2018-02-18 DIAGNOSIS — Z794 Long term (current) use of insulin: Secondary | ICD-10-CM | POA: Diagnosis not present

## 2018-02-18 DIAGNOSIS — Z79899 Other long term (current) drug therapy: Secondary | ICD-10-CM | POA: Diagnosis not present

## 2018-02-18 DIAGNOSIS — I509 Heart failure, unspecified: Secondary | ICD-10-CM | POA: Insufficient documentation

## 2018-02-18 DIAGNOSIS — Z9889 Other specified postprocedural states: Secondary | ICD-10-CM | POA: Insufficient documentation

## 2018-02-18 DIAGNOSIS — K219 Gastro-esophageal reflux disease without esophagitis: Secondary | ICD-10-CM | POA: Insufficient documentation

## 2018-02-18 DIAGNOSIS — Z888 Allergy status to other drugs, medicaments and biological substances status: Secondary | ICD-10-CM | POA: Insufficient documentation

## 2018-02-18 DIAGNOSIS — E119 Type 2 diabetes mellitus without complications: Secondary | ICD-10-CM | POA: Insufficient documentation

## 2018-02-18 DIAGNOSIS — I10 Essential (primary) hypertension: Secondary | ICD-10-CM

## 2018-02-18 DIAGNOSIS — I110 Hypertensive heart disease with heart failure: Secondary | ICD-10-CM | POA: Insufficient documentation

## 2018-02-18 DIAGNOSIS — Z823 Family history of stroke: Secondary | ICD-10-CM | POA: Insufficient documentation

## 2018-02-18 DIAGNOSIS — Z8249 Family history of ischemic heart disease and other diseases of the circulatory system: Secondary | ICD-10-CM | POA: Insufficient documentation

## 2018-02-18 DIAGNOSIS — M7582 Other shoulder lesions, left shoulder: Secondary | ICD-10-CM

## 2018-02-18 DIAGNOSIS — I5022 Chronic systolic (congestive) heart failure: Secondary | ICD-10-CM

## 2018-02-18 DIAGNOSIS — Z833 Family history of diabetes mellitus: Secondary | ICD-10-CM | POA: Insufficient documentation

## 2018-02-18 LAB — GLUCOSE, CAPILLARY: GLUCOSE-CAPILLARY: 173 mg/dL — AB (ref 65–99)

## 2018-02-18 NOTE — Patient Instructions (Signed)
Continue weighing daily and call for an overnight weight gain of > 2 pounds or a weekly weight gain of >5 pounds. 

## 2018-02-21 ENCOUNTER — Telehealth: Payer: Self-pay | Admitting: Internal Medicine

## 2018-02-21 NOTE — Telephone Encounter (Signed)
Received records request [employer], forwarded to [facility] for processing.

## 2018-02-25 ENCOUNTER — Encounter: Payer: Self-pay | Admitting: Internal Medicine

## 2018-02-25 ENCOUNTER — Ambulatory Visit (INDEPENDENT_AMBULATORY_CARE_PROVIDER_SITE_OTHER): Payer: 59 | Admitting: Internal Medicine

## 2018-02-25 VITALS — BP 118/84 | HR 81 | Ht 73.0 in | Wt 179.2 lb

## 2018-02-25 DIAGNOSIS — I447 Left bundle-branch block, unspecified: Secondary | ICD-10-CM | POA: Diagnosis not present

## 2018-02-25 DIAGNOSIS — Z95810 Presence of automatic (implantable) cardiac defibrillator: Secondary | ICD-10-CM

## 2018-02-25 DIAGNOSIS — I428 Other cardiomyopathies: Secondary | ICD-10-CM | POA: Diagnosis not present

## 2018-02-25 DIAGNOSIS — I5022 Chronic systolic (congestive) heart failure: Secondary | ICD-10-CM | POA: Diagnosis not present

## 2018-02-25 NOTE — Patient Instructions (Signed)
Medication Instructions: - Your physician recommends that you continue on your current medications as directed. Please refer to the Current Medication list given to you today.  Labwork: - none ordered  Procedures/Testing: - none ordered  Follow-Up: - Remote monitoring is used to monitor your Pacemaker of ICD from home. This monitoring reduces the number of office visits required to check your device to one time per year. It allows [place] to keep an eye on the functioning of your device to ensure it is working properly. You are scheduled for a device check from home on 04/01/18. You may send your transmission at any time that day. If you have a wireless device, the transmission will be sent automatically. After your physician reviews your transmission, you will receive a postcard with your next transmission date.  - Your physician wants you to follow-up in: February 2020 with Dr. Caryl Comes. You will receive a reminder letter in the mail two months in advance. If you don't receive a letter, please call our office to schedule the follow-up appointment.   Any Additional Special Instructions Will Be Listed Below (If Applicable).     If you need a refill on your cardiac medications before your next appointment, please call your pharmacy.

## 2018-02-25 NOTE — Progress Notes (Signed)
Patient Care Team: [name], MD as PCP - General End, Harrell Gave, MD as PCP - Cardiology (Cardiology) Alisa Graff, FNP as Nurse Practitioner (Family Medicine)   HPI    Philip Ponce is a 47 y.o. male Seen in follow-up for an [employer] CRT-ICD implanted Dr. Carlyn Reichert 10/18.  He has a nonischemic cardiomyopathy, left bundle branch block and history of congestive heart failure.  He is s/p Weightloss reduction therapy  Sometime he misses the [place] in with complaints of pounding on his L side-- previously assessed with inablitiy to reproduce in the office  Denies sob or chest pain or DM   DATE TEST EF   3/18 LHC  20-25 % Normal CAs  4/19 Echo   30.-35 %         Date Cr K  5/19 1.02 4.3           Records and Results Reviewed   Past Medical History:  Diagnosis Date  . Acute blood loss anemia 11/20/2016  . 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 Chronic combined systolic (congestive) and diastolic (congestive) heart failure ([place])    a. 11/2016 Ehco: EF 20-25%, glob HK, antsept, ant, apical HK, Gr1 DD, mild MR, mildly dil LA, nl RV fxn; b. 05/2017 Echo: Ef 20-25%, Gr2 DD, prominent apical trabeculations.  . Diabetes mellitus ([place])   . Family history of adverse reaction to anesthesia    mom had a hard time waking up  . GERD (gastroesophageal reflux disease)   . NICM (nonischemic cardiomyopathy) ([place])    a. 11/2016 Cath: nl cors, EF 25-30%; b. 11/2016 Echo: EF 20-25%; c. 05/2017 Echo: EF 20-25%.  . Slipped intervertebral disc    L4 L5  . STEMI (ST elevation myocardial infarction) ([place])    a. 11/2016 ST elevation -->Nl cors on cath.    Past Surgical History:  Procedure Laterality Date  . BIV ICD INSERTION CRT-D  06/26/2017  . BIV ICD INSERTION CRT-D N/A 06/26/2017   Procedure: BIV ICD INSERTION CRT-D;  Surgeon: [name], MD;  Location: [place] [facility];  Service: Cardiovascular;  Laterality: N/A;  . CERVICAL  DISC ARTHROPLASTY N/A 07/24/2017   Procedure: CERVICAL ANTERIOR [place] ARTHROPLASTY C5-C7;  Surgeon: [name], MD;  Location: [facility] ORS;  Service: Neurosurgery;  Laterality: N/A;  . COLONOSCOPY WITH PROPOFOL N/A 12/03/2016   Procedure: COLONOSCOPY WITH PROPOFOL;  Surgeon: [name], MD;  Location: [facility] ENDOSCOPY;  Service: Endoscopy;  Laterality: N/A;  . ESOPHAGOGASTRODUODENOSCOPY (EGD) WITH PROPOFOL N/A 12/03/2016   Procedure: ESOPHAGOGASTRODUODENOSCOPY (EGD) WITH PROPOFOL;  Surgeon: [name], MD;  Location: [facility] ENDOSCOPY;  Service: Endoscopy;  Laterality: N/A;  . FINGER SURGERY Right 2012   index finger  . GASTRIC BYPASS    . GIVENS CAPSULE STUDY N/A 01/07/2017   Procedure: GIVENS CAPSULE STUDY;  Surgeon: [name], MD;  Location: [facility] ENDOSCOPY;  Service: Endoscopy;  Laterality: N/A;  . LEFT HEART CATH AND CORONARY ANGIOGRAPHY N/A 12/04/2016   Procedure: Left Heart Cath and Coronary Angiography;  Surgeon: [name], MD;  Location: [place] [facility] LAB;  Service: Cardiovascular;  Laterality: N/A;  . SHOULDER ARTHROSCOPY WITH OPEN ROTATOR CUFF REPAIR Left 10/04/2017   Procedure: SHOULDER ARTHROSCOPY WITH ROTATOR CUFF REPAIR, SUBACROMIAL DECOMPRESSION,OPEN BICEP TENODESIS, EXTENSIVE DEBRIDEMENT;  Surgeon: [name], MD;  Location: [facility] ORS;  Service: Orthopedics;  Laterality: Left;    Current Meds  Medication Sig  . acetaminophen (TYLENOL) 500 MG tablet Take  500 mg by mouth every 6 (six) hours as needed.  . calcium carbonate (OS-CAL) 600 MG TABS tablet Take 1,200 mg by mouth daily with breakfast.   . carvedilol (COREG) 12.5 MG tablet Take 1 tablet (12.5 mg total) by mouth 2 (two) times daily.  [place] Kitchen glucose blood (ONE TOUCH ULTRA TEST) test strip Check blood sugar once daily and as instructed. Dx E11.9  . Insulin Glargine (TOUJEO SOLOSTAR) 300 UNIT/ML SOPN Inject 15 Units into the skin daily.  . metFORMIN (GLUCOPHAGE) 500 MG tablet TAKE 2 TABLETS BY MOUTH TWICE DAILY WITH A MEAL    . Multiple Vitamins-Minerals (ADULT GUMMY PO) Take 2 tablets by mouth daily.  . Omega-3 Fatty Acids (FISH OIL PO) Take 800 mg by mouth daily.  [place] Kitchen omeprazole (PRILOSEC) 40 MG capsule Take 40 mg by mouth daily as needed (indigestion).   . sacubitril-valsartan (ENTRESTO) 97-103 MG Take 1 tablet by mouth 2 (two) times daily.  [place] Kitchen spironolactone (ALDACTONE) 25 MG tablet Take 1 tablet (25 mg total) by mouth daily.    Allergies  Allergen Reactions  . Mobic [Meloxicam] Other (See Comments)    Ulcers in stomach eruption   . Tramadol     Hard to breath  . Diclofenac Palpitations and Other (See Comments)    Fast heart beat, CP,SOB and weakness on one side.      Review of Systems negative except from HPI and PMH  Physical Exam BP 118/84 (BP Location: Left Arm, Patient Position: Sitting, Cuff Size: Normal)   Pulse 81   Ht 6\' 1"  (1.854 m)   Wt 179 lb 4 oz (81.3 kg)   BMI 23.65 kg/m  Well developed and nourished in no acute distress HENT normal Neck supple with JVP-flat Clear Device pocket well healed; without hematoma or erythema.  There is no tethering  Regular rate and rhythm, no murmurs or gallops Abd-soft with active BS No Clubbing cyanosis edema Skin-warm and dry A & Oriented  Grossly normal sensory and motor function   ECG demonstrates P synchronous pacing with an RS in lead V1 is a QR in lead I Pre-implant ECG demonstrated left bundle branch block with QRS duration 150  Post implant ECG QRS duration 102 ms  Chest x-ray was reviewed from post implant.  It is a very proximal lead location  Assessment and  Plan  Left bundle branch block  Nonischemic cardiomyopathy  Diaphragmatic stimulation  Congestive heart failure-chronic-systolic class II   Euvolemic continue current meds  Have reprogrammed empiracally from poles 2-4 >> 2-1 after review of the CXR-  Lead has closely spaced 2-3 poles  We spent more than 50% of our >25 min visit in face to face counseling regarding  the above     Current medicines are reviewed at length with the patient today .  The patient does not  have concerns regarding medicines.

## 2018-02-26 DIAGNOSIS — Z9889 Other specified postprocedural states: Secondary | ICD-10-CM | POA: Diagnosis not present

## 2018-02-26 DIAGNOSIS — M75122 Complete rotator cuff tear or rupture of left shoulder, not specified as traumatic: Secondary | ICD-10-CM | POA: Diagnosis not present

## 2018-02-28 DIAGNOSIS — Z9889 Other specified postprocedural states: Secondary | ICD-10-CM | POA: Diagnosis not present

## 2018-03-04 DIAGNOSIS — Z9889 Other specified postprocedural states: Secondary | ICD-10-CM | POA: Diagnosis not present

## 2018-03-05 DIAGNOSIS — H35033 Hypertensive retinopathy, bilateral: Secondary | ICD-10-CM | POA: Diagnosis not present

## 2018-03-05 DIAGNOSIS — I10 Essential (primary) hypertension: Secondary | ICD-10-CM | POA: Diagnosis not present

## 2018-03-05 DIAGNOSIS — E1165 Type 2 diabetes mellitus with hyperglycemia: Secondary | ICD-10-CM | POA: Diagnosis not present

## 2018-03-05 LAB — HM DIABETES EYE EXAM

## 2018-03-06 ENCOUNTER — Encounter: Payer: Self-pay | Admitting: Family Medicine

## 2018-03-06 DIAGNOSIS — Z9889 Other specified postprocedural states: Secondary | ICD-10-CM | POA: Diagnosis not present

## 2018-03-13 DIAGNOSIS — T1501XA Foreign body in cornea, right eye, initial encounter: Secondary | ICD-10-CM | POA: Diagnosis not present

## 2018-04-01 ENCOUNTER — Ambulatory Visit (INDEPENDENT_AMBULATORY_CARE_PROVIDER_SITE_OTHER): Payer: 59 | Admitting: *Deleted

## 2018-04-01 DIAGNOSIS — I428 Other cardiomyopathies: Secondary | ICD-10-CM

## 2018-04-01 DIAGNOSIS — I5022 Chronic systolic (congestive) heart failure: Secondary | ICD-10-CM

## 2018-04-01 NOTE — Progress Notes (Signed)
Remote ICD transmission.   

## 2018-04-07 NOTE — Telephone Encounter (Signed)
Patient spoke with [employer] and they are saying that it needs to be completed by the 17th of July Please advise

## 2018-04-07 NOTE — Telephone Encounter (Signed)
Routing to Dr End to see if he can complete tomorrow when in the office.

## 2018-04-07 NOTE — Telephone Encounter (Signed)
Placed in Dr Darnelle Bos basket.

## 2018-04-07 NOTE — Telephone Encounter (Signed)
Will try to complete tomorrow.  [name], MD [facility] Pager: [phone]

## 2018-04-07 NOTE — Telephone Encounter (Signed)
Patient would like a call once it is signed and sent

## 2018-04-07 NOTE — Telephone Encounter (Signed)
Received [employer] paperwork  Placed in [place] for completion

## 2018-04-08 NOTE — Telephone Encounter (Signed)
Sent completed forms to [employer] 

## 2018-04-08 NOTE — Telephone Encounter (Signed)
Contacted patient to clarify if he is right or left handed for the completion of the forms. He is left handed. Forms completed and signed by Dr End.  Patient aware the forms are completed.  Forms given to [place] at front desk.

## 2018-04-08 NOTE — Telephone Encounter (Signed)
Also faxed forms to [facility]

## 2018-04-09 ENCOUNTER — Encounter: Payer: Self-pay | Admitting: Internal Medicine

## 2018-04-09 ENCOUNTER — Ambulatory Visit (INDEPENDENT_AMBULATORY_CARE_PROVIDER_SITE_OTHER): Payer: 59 | Admitting: Internal Medicine

## 2018-04-09 VITALS — BP 124/80 | HR 71 | Ht 73.0 in | Wt 181.0 lb

## 2018-04-09 DIAGNOSIS — I10 Essential (primary) hypertension: Secondary | ICD-10-CM | POA: Diagnosis not present

## 2018-04-09 DIAGNOSIS — I5022 Chronic systolic (congestive) heart failure: Secondary | ICD-10-CM | POA: Diagnosis not present

## 2018-04-09 DIAGNOSIS — I428 Other cardiomyopathies: Secondary | ICD-10-CM | POA: Diagnosis not present

## 2018-04-09 MED ORDER — CARVEDILOL 25 MG PO TABS: 25.0000 mg | ORAL_TABLET | Freq: Two times a day (BID) | ORAL | 3 refills | Status: DC

## 2018-04-09 NOTE — Patient Instructions (Signed)
Medication Instructions:  Your physician has recommended you make the following change in your medication:  1- INCREASE Carvedilol to 25 mg by mouth two times a day.   Labwork: none  Testing/Procedures: none  Follow-Up: Your physician recommends that you schedule a follow-up appointment in: 3 MONTHS WITH DR END.   If you need a refill on your cardiac medications before your next appointment, please call your pharmacy.

## 2018-04-09 NOTE — Progress Notes (Signed)
Follow-up Outpatient Visit Date: 04/09/2018  Primary Care Provider: [name], MD [place] [address]  Chief Complaint: Follow-up chronic heart failure  HPI:  Mr. Biskup is a 47 y.o. year-old male with history of chronic systolic heart failure secondary to NICM, hypertension, and type 2 diabetes mellitus, who presents for follow-up of heart failure.  I last saw Mr. Suto in April, at which time he reported continued fatigue with modest activity.  He was concerned about being able to go back to work, as he often is wiped out after doing strenuous activities.  We started spironolactone back at that time, with continuation of carvedilol and Entresto.  Mr. Wickliff has been bothered by intermittent extracardiac pacing from his LV lead.  He was seen by Dr. Caryl Comes last month, at which time LV pacing parameters were adjusted.  Today, Mr. Sahr reports feeling about the same as at our last visit.  He recently had quite a bit of stress due to issues with his disability claim.  However, this has since been resolved.  He has not had any chest pain but still notes exertional dyspnea with modest activity around the house.  After being active during the day, he often needs to nap in the afternoon due to overwhelming fatigue.  He denies orthopnea, PND, and edema.  His weight has been stable.  He is tolerating his medications well.  He continues to notice occasional chest wall stimulation by his biventricular pacer, particularly when lying on the left side after eating.  It is annoying but not painful.  --------------------------------------------------------------------------------------------------  Cardiovascular History & Procedures: Cardiovascular Problems:  Chronic systolic heart failure secondary to nonischemic cardiomyopathy  Risk Factors:  Hypertension, diabetes mellitus, and male gender  Cath/PCI:  LHC (12/04/16): No angiographically significant CAD.  Moderate to severe LV contractile dysfunction (LVEF 30-35%) with mildly elevated left ventricular filling pressure.  LHC (09/28/13, [facility]): No significant CAD. LVEF 45%.  CV Surgery:  None  EP Procedures and Devices:  BiV-ICD placement (06/26/17, Dr. Curt Bears): [employer]  Non-Invasive Evaluation(s):  Limited TTE (01/06/18): Mildly dilated LV with normal wall thickness.  LVEF 30-35% with grade 1 diastolic dysfunction and global hypokinesis.  TTE (05/28/17): Moderately dilated LV with normal wall thickness. LVEF 20-25% with diffuse hypokinesis and grade 2 diastolic dysfunction. Normal RV size and function.  TTE (12/05/16): Moderately dilated left ventricle with LVEF of 20-25% and global hypokinesis. Grade 1 diastolic dysfunction. Mild MR. Mild left atrial enlargement. Normal RV size and function.  TTE (04/14/15): Normal LV size with mild LVH. LVEF 50-55% with normal wall motion. Grade 1 diastolic dysfunction. Mild left atrial enlargement. Normal RV size and function. No significant valvular abnormalities.  Recent CV Pertinent Labs: Lab Results  Component Value Date   CHOL 121 10/07/2015   HDL 66.70 10/07/2015   LDLCALC 39 10/07/2015   TRIG 78.0 10/07/2015   CHOLHDL 2 10/07/2015   INR 1.11 07/18/2017   INR 1.0 08/04/2012   K 4.3 01/31/2018   K 3.9 08/04/2012   MG 1.9 12/06/2016   MG 2.0 08/04/2012   BUN 21 (H) 01/31/2018   BUN 12 01/15/2018   BUN 21 (H) 08/04/2012   CREATININE 1.02 01/31/2018   CREATININE 0.98 11/23/2016    Past medical and surgical history were reviewed and updated in EPIC.  Current Meds  Medication Sig  . acetaminophen (TYLENOL) 500 MG tablet Take 500 mg by mouth every 6 (six) hours as needed.  . calcium carbonate (OS-CAL) 600 MG  TABS tablet Take 1,200 mg by mouth daily with breakfast.   . glucose blood (ONE TOUCH ULTRA TEST) test strip Check blood sugar once daily and as instructed. Dx E11.9  . Insulin Glargine (TOUJEO SOLOSTAR) 300 UNIT/ML SOPN Inject 15  Units into the skin daily.  . metFORMIN (GLUCOPHAGE) 500 MG tablet TAKE 2 TABLETS BY MOUTH TWICE DAILY WITH A MEAL  . Multiple Vitamins-Minerals (ADULT GUMMY PO) Take 2 tablets by mouth daily.  . Omega-3 Fatty Acids (FISH OIL PO) Take 800 mg by mouth daily.  Marland Kitchen omeprazole (PRILOSEC) 40 MG capsule Take 40 mg by mouth daily as needed (indigestion).   . sacubitril-valsartan (ENTRESTO) 97-103 MG Take 1 tablet by mouth 2 (two) times daily.  Marland Kitchen spironolactone (ALDACTONE) 25 MG tablet Take 1 tablet (25 mg total) by mouth daily.    Allergies: Mobic [meloxicam]; Tramadol; and Diclofenac  Social History   Tobacco Use  . Smoking status: Never Smoker  . Smokeless tobacco: Never Used  Substance Use Topics  . Alcohol use: No  . Drug use: No    Family History  Problem Relation Age of Onset  . Diabetes Mother   . Hypertension Mother   . Cancer Mother        breast  . Diabetes Father   . Hypertension Father   . Cancer Father        colon  . Cancer Maternal Grandfather        prostate  . Stroke Maternal Grandfather        CVA    Review of Systems: A 12-system review of systems was performed and was negative except as noted in the HPI.  --------------------------------------------------------------------------------------------------  Physical Exam: BP 124/80 (BP Location: Left Arm, Patient Position: Sitting, Cuff Size: Normal)   Pulse 71   Ht 6\' 1"  (1.854 m)   Wt 181 lb (82.1 kg)   BMI 23.88 kg/m   General: NAD. HEENT: No conjunctival pallor or scleral icterus. Moist mucous membranes.  OP clear. Neck: Supple without lymphadenopathy, thyromegaly, JVD, or HJR. Lungs: Normal work of breathing. Clear to auscultation bilaterally without wheezes or crackles. Heart: Regular rate and rhythm without murmurs, rubs, or gallops. Non-displaced PMI. Abd: Bowel sounds present. Soft, NT/ND without hepatosplenomegaly Ext: No lower extremity edema. Radial, PT, and DP pulses are 2+  bilaterally. Skin: Warm and dry without rash.  EKG: Normal sinus rhythm with ventricular pacing.  Lab Results  Component Value Date   WBC 5.6 01/31/2018   HGB 12.9 (L) 01/31/2018   HCT 39.3 (L) 01/31/2018   MCV 89.0 01/31/2018   PLT 188 01/31/2018    Lab Results  Component Value Date   NA 138 01/31/2018   K 4.3 01/31/2018   CL 103 01/31/2018   CO2 25 01/31/2018   BUN 21 (H) 01/31/2018   CREATININE 1.02 01/31/2018   GLUCOSE 167 (H) 01/31/2018   ALT 15 01/03/2016    Lab Results  Component Value Date   CHOL 121 10/07/2015   HDL 66.70 10/07/2015   LDLCALC 39 10/07/2015   TRIG 78.0 10/07/2015   CHOLHDL 2 10/07/2015    --------------------------------------------------------------------------------------------------  ASSESSMENT AND PLAN: Chronic systolic heart failure secondary to nonischemic cardiomyopathy Mr. Ki appears euvolemic with continued NYHA class II-III heart failure symptoms.  We will increase carvedilol to 25 mg twice daily and continue current doses of Entresto and spironolactone.  He is due for repeat labs in August or September through his PCP; most recent BMP showed normal renal function and potassium.  He should continue routine device follow-up.  Essential hypertension Blood pressure normal today.  We will increase carvedilol, as above, to optimize evidence-based heart failure therapy.  Follow-up: Return to clinic in 3 months.  [name], MD 04/10/2018 7:18 AM

## 2018-04-10 ENCOUNTER — Encounter: Payer: Self-pay | Admitting: Internal Medicine

## 2018-04-16 LAB — CUP PACEART REMOTE DEVICE CHECK
Brady Statistic AP VP Percent: 0.03 %
Brady Statistic RA Percent Paced: 0.04 %
Brady Statistic RV Percent Paced: 38.31 %
Date Time Interrogation Session: 20190709073523
HIGH POWER IMPEDANCE MEASURED VALUE: 47 Ohm
Implantable Lead Implant Date: 20181003
Implantable Lead Implant Date: 20181003
Implantable Lead Location: 753859
Implantable Lead Location: 753860
Implantable Lead Model: 4398
Implantable Lead Model: 5076
Lead Channel Impedance Value: 185.366
Lead Channel Impedance Value: 185.366
Lead Channel Impedance Value: 222.34 Ohm
Lead Channel Impedance Value: 222.34 Ohm
Lead Channel Impedance Value: 304 Ohm
Lead Channel Impedance Value: 361 Ohm
Lead Channel Impedance Value: 418 Ohm
Lead Channel Impedance Value: 475 Ohm
Lead Channel Impedance Value: 589 Ohm
Lead Channel Impedance Value: 646 Ohm
Lead Channel Impedance Value: 665 Ohm
Lead Channel Impedance Value: 760 Ohm
Lead Channel Impedance Value: 779 Ohm
Lead Channel Pacing Threshold Amplitude: 0.5 V
Lead Channel Pacing Threshold Amplitude: 0.75 V
Lead Channel Pacing Threshold Pulse Width: 0.4 ms
Lead Channel Sensing Intrinsic Amplitude: 2.75 mV
Lead Channel Setting Pacing Amplitude: 2.5 V
Lead Channel Setting Pacing Pulse Width: 0.4 ms
Lead Channel Setting Sensing Sensitivity: 0.3 mV
MDC IDC LEAD IMPLANT DT: 20181003
MDC IDC LEAD LOCATION: 753858
MDC IDC MSMT BATTERY REMAINING LONGEVITY: 104 mo
MDC IDC MSMT BATTERY VOLTAGE: 3.01 V
MDC IDC MSMT LEADCHNL LV IMPEDANCE VALUE: 176 Ohm
MDC IDC MSMT LEADCHNL LV IMPEDANCE VALUE: 304 Ohm
MDC IDC MSMT LEADCHNL LV IMPEDANCE VALUE: 418 Ohm
MDC IDC MSMT LEADCHNL LV IMPEDANCE VALUE: 475 Ohm
MDC IDC MSMT LEADCHNL LV IMPEDANCE VALUE: 703 Ohm
MDC IDC MSMT LEADCHNL LV PACING THRESHOLD AMPLITUDE: 0.875 V
MDC IDC MSMT LEADCHNL LV PACING THRESHOLD PULSEWIDTH: 0.4 ms
MDC IDC MSMT LEADCHNL RA SENSING INTR AMPL: 2.75 mV
MDC IDC MSMT LEADCHNL RV PACING THRESHOLD PULSEWIDTH: 0.4 ms
MDC IDC MSMT LEADCHNL RV SENSING INTR AMPL: 7.5 mV
MDC IDC MSMT LEADCHNL RV SENSING INTR AMPL: 7.5 mV
MDC IDC PG IMPLANT DT: 20181003
MDC IDC SET LEADCHNL LV PACING AMPLITUDE: 1.5 V
MDC IDC SET LEADCHNL LV PACING PULSEWIDTH: 0.4 ms
MDC IDC SET LEADCHNL RA PACING AMPLITUDE: 2 V
MDC IDC STAT BRADY AP VS PERCENT: 0.01 %
MDC IDC STAT BRADY AS VP PERCENT: 98.78 %
MDC IDC STAT BRADY AS VS PERCENT: 1.18 %

## 2018-04-18 ENCOUNTER — Telehealth: Payer: Self-pay | Admitting: Internal Medicine

## 2018-04-18 NOTE — Telephone Encounter (Signed)
Called patient. Carvedilol was increased to 25 mg BID on 7/17 at [facility]. Since the increase, he has felt more tired and drained.  He is finding it hard to do things because he is so tired. He has episode last night where his "kidneys felt locked up." That feeling is better today. Advised patient to drink plenty of water and stay hydrated. He decided to take 12.5 mg this morning instead of 25 mg. He said he did not have any problems taking the lower dosage. Routing to Dr End for advice.

## 2018-04-18 NOTE — Telephone Encounter (Signed)
Pt c/o medication issue:  1. Name of Medication: Carvedilol   2. How are you currently taking this medication (dosage and times per day)? 25 mg takes it twice a day   3. Are you having a reaction (difficulty breathing--STAT)? No   4. What is your medication issue? He states is having a hard time with doubling up  He states he was taking the 12.5 mg now he is on 25 mg total in a day. He states he just doesn't feel like himself. He states he is SOB and last night felt like his Kidney was trying to come out. He wasn't sure how to explain that feeling but knows this was the only change.   Would like advise

## 2018-04-20 NOTE — Telephone Encounter (Signed)
Since he did not feel well after increased dose of carvedilol, he should return back to 12.5 mg BID.  If he continues to feel unwell, he should let us know.  [name], MD [facility] Pager: [phone]

## 2018-04-21 MED ORDER — CARVEDILOL 25 MG PO TABS: 12.5000 mg | ORAL_TABLET | Freq: Two times a day (BID) | ORAL | 3 refills | Status: DC

## 2018-04-21 NOTE — Telephone Encounter (Signed)
Called patient and he verbalized understanding to decrease carvedilol to 12.5 mg by mouth two times a day. He has plenty of pills at this time and does not need a prescription sent in. Med list updated.

## 2018-04-22 ENCOUNTER — Telehealth: Payer: Self-pay | Admitting: Internal Medicine

## 2018-04-30 DIAGNOSIS — M75122 Complete rotator cuff tear or rupture of left shoulder, not specified as traumatic: Secondary | ICD-10-CM | POA: Diagnosis not present

## 2018-05-14 ENCOUNTER — Telehealth: Payer: Self-pay | Admitting: Family Medicine

## 2018-05-14 MED ORDER — GLUCOSE BLOOD VI STRP: ORAL_STRIP | 1 refills | Status: DC

## 2018-05-14 NOTE — Telephone Encounter (Signed)
Copied from [place] [id number]. Topic: Quick Communication - Rx Refill/Question >> May 14, 2018 10:19 AM Selinda Flavin B, NT wrote: Medication: glucose blood (ONE TOUCH ULTRA TEST) test strip  Has the patient contacted their pharmacy? Yes.   (Agent: If no, request that the patient contact the pharmacy for the refill.) (Agent: If yes, when and what did the pharmacy advise?)  Preferred Pharmacy (with phone number or street name): [facility] [place]: Please be advised that RX refills may take up to 3 business days. We ask that you follow-up with your pharmacy.

## 2018-05-21 ENCOUNTER — Telehealth: Payer: Self-pay | Admitting: Internal Medicine

## 2018-05-21 NOTE — Telephone Encounter (Signed)
Recieved request from : [facility]   Forwarded to [employer] for processing

## 2018-05-30 ENCOUNTER — Encounter: Payer: Self-pay | Admitting: Family Medicine

## 2018-05-30 ENCOUNTER — Ambulatory Visit (INDEPENDENT_AMBULATORY_CARE_PROVIDER_SITE_OTHER)
Admission: RE | Admit: 2018-05-30 | Discharge: 2018-05-30 | Disposition: A | Discharge status: Home / Self Care | Payer: 59 | Source: Ambulatory Visit | Attending: Family Medicine | Admitting: Family Medicine

## 2018-05-30 ENCOUNTER — Telehealth: Payer: Self-pay

## 2018-05-30 ENCOUNTER — Ambulatory Visit (INDEPENDENT_AMBULATORY_CARE_PROVIDER_SITE_OTHER): Payer: 59 | Admitting: Family Medicine

## 2018-05-30 VITALS — BP 92/62 | HR 79 | Temp 98.6°F | Ht 73.0 in | Wt 188.8 lb

## 2018-05-30 DIAGNOSIS — M25562 Pain in left knee: Secondary | ICD-10-CM | POA: Diagnosis not present

## 2018-05-30 DIAGNOSIS — G8929 Other chronic pain: Secondary | ICD-10-CM | POA: Diagnosis not present

## 2018-05-30 DIAGNOSIS — M25561 Pain in right knee: Secondary | ICD-10-CM | POA: Diagnosis not present

## 2018-05-30 DIAGNOSIS — M1712 Unilateral primary osteoarthritis, left knee: Secondary | ICD-10-CM | POA: Diagnosis not present

## 2018-05-30 NOTE — Assessment & Plan Note (Signed)
Legs weak from deconditioning.. Contributing to instability.  Eval with X-ray for OA. No clear injury otherwise.

## 2018-05-30 NOTE — Progress Notes (Signed)
   Subjective:    Patient ID: Philip Ponce, male    DOB: 1971-03-08, 47 y.o.   MRN: [id number]  HPI   47 year old male with CHF due to nonischemic cardiomyopathy, DM, RBBB and history of cervical radiculopathy presents with " weak knees" and " legs giving out"  He reports in the last couple years off and on. Some days has issues some not. Worse if  doing a lot of walking or going up stairs.  When going up step knee themselves feel unstable especially going up stairs. Feels sharp pain in  Right knee more than left but in both. No clicking, no popping. No past injury, no recent falls.  No pain or numbness in back or radiculopathy.   Has not taken any med to treat.  Hx of morbid obesity s/p bariatric surgery.  Hx of steroid inj on left for sciatica  Blood pressure 92/62, pulse 79, temperature 98.6 F (37 C), temperature source Oral, height 6\' 1"  (1.854 m), weight 188 lb 12 oz (85.6 kg). Social History /Family History/Past Medical History reviewed in detail and updated in EMR if needed.   Review of Systems  Constitutional: Negative for fatigue and fever.  HENT: Negative for ear pain.   Eyes: Negative for pain.  Respiratory: Negative for cough and shortness of breath.   Cardiovascular: Negative for chest pain, palpitations and leg swelling.  Gastrointestinal: Negative for abdominal pain.  Genitourinary: Negative for dysuria.  Musculoskeletal: Negative for arthralgias.  Neurological: Negative for syncope, light-headedness and headaches.  Psychiatric/Behavioral: Negative for dysphoric mood.       Objective:   Physical Exam  Constitutional: Vital signs are normal. He appears well-developed and well-nourished.  HENT:  Head: Normocephalic.  Right Ear: Hearing normal.  Left Ear: Hearing normal.  Nose: Nose normal.  Mouth/Throat: Oropharynx is clear and moist and mucous membranes are normal.  Neck: Trachea normal. Carotid bruit is not present. No thyroid mass and no  thyromegaly present.  Cardiovascular: Normal rate, regular rhythm and normal pulses. Exam reveals no gallop, no distant heart sounds and no friction rub.  No murmur heard. No peripheral edema  Pulmonary/Chest: Effort normal and breath sounds normal. No respiratory distress.  Musculoskeletal:       Right knee: He exhibits normal range of motion, no swelling, no effusion, no ecchymosis and no deformity. Tenderness found. Lateral joint line tenderness noted.       Left knee: He exhibits deformity. He exhibits normal range of motion, no swelling, no effusion, no ecchymosis and normal meniscus. No tenderness found. No medial joint line, no lateral joint line, no MCL, no LCL and no patellar tendon tenderness noted.  Atrophy of bialteral lower extremity muscle   decreased hair growth on lower legs butpt state this is normal for him  nml pulses bilaterally pos tib.  Skin: Skin is warm, dry and intact. No rash noted.  Psychiatric: He has a normal mood and affect. His speech is normal and behavior is normal. Thought content normal.          Assessment & Plan:

## 2018-05-30 NOTE — Telephone Encounter (Signed)
Pt wife called because his monitor screen was showing different colors on the screen. I gave her the number to [employer] support to get additional help with monitor

## 2018-05-30 NOTE — Patient Instructions (Signed)
We will call X-ray results.  Slowly get back to exercise and lower extremity strength training as tolerated. Go slow.

## 2018-06-03 NOTE — Telephone Encounter (Signed)
Made in error

## 2018-06-14 ENCOUNTER — Telehealth: Payer: Self-pay | Admitting: Family Medicine

## 2018-06-14 DIAGNOSIS — E7800 Pure hypercholesterolemia, unspecified: Secondary | ICD-10-CM

## 2018-06-14 DIAGNOSIS — E119 Type 2 diabetes mellitus without complications: Secondary | ICD-10-CM

## 2018-06-14 DIAGNOSIS — D509 Iron deficiency anemia, unspecified: Secondary | ICD-10-CM

## 2018-06-14 DIAGNOSIS — Z125 Encounter for screening for malignant neoplasm of prostate: Secondary | ICD-10-CM

## 2018-06-14 NOTE — Telephone Encounter (Signed)
-----   Message from Lendon Collar, RT sent at 06/09/2018  9:25 AM EDT ----- Regarding: Lab orders for Wednesday 06/18/18 Please enter 25month follow up labs orders for 06/18/18. Thanks!

## 2018-06-16 ENCOUNTER — Other Ambulatory Visit: Payer: Self-pay | Admitting: *Deleted

## 2018-06-16 ENCOUNTER — Telehealth: Payer: Self-pay | Admitting: Internal Medicine

## 2018-06-16 ENCOUNTER — Other Ambulatory Visit: Payer: Self-pay | Admitting: Family Medicine

## 2018-06-16 DIAGNOSIS — I10 Essential (primary) hypertension: Secondary | ICD-10-CM

## 2018-06-16 MED ORDER — SPIRONOLACTONE 25 MG PO TABS: 25.0000 mg | ORAL_TABLET | Freq: Every day | ORAL | 0 refills | Status: DC

## 2018-06-16 NOTE — Telephone Encounter (Signed)
Patient wife calling States that patient is going to be having a dental procedure tomorrow  Patient has been given amoxicillin 500 MG and has been told to take 4 capsules 1 hour prior to procedure tomorrow morning at 8am Calling to make sure this is ok with cardiologist due to condition Please call to discuss

## 2018-06-16 NOTE — Telephone Encounter (Signed)
Requested Prescriptions   Signed Prescriptions Disp Refills  . spironolactone (ALDACTONE) 25 MG tablet 90 tablet 0    Sig: Take 1 tablet (25 mg total) by mouth daily.    Authorizing Provider: END, CHRISTOPHER    Ordering User: Britt Bottom

## 2018-06-16 NOTE — Telephone Encounter (Signed)
Called patient's wife.  Let her know that taking the antibiotic preprocedure as prescribed should be fine as this is a standard dose routinely used. She was appreciative and wanted to make sure it would be ok.

## 2018-06-18 ENCOUNTER — Telehealth: Payer: Self-pay | Admitting: Cardiovascular Disease

## 2018-06-18 ENCOUNTER — Other Ambulatory Visit (INDEPENDENT_AMBULATORY_CARE_PROVIDER_SITE_OTHER): Payer: 59

## 2018-06-18 DIAGNOSIS — I10 Essential (primary) hypertension: Secondary | ICD-10-CM | POA: Diagnosis not present

## 2018-06-18 DIAGNOSIS — E119 Type 2 diabetes mellitus without complications: Secondary | ICD-10-CM

## 2018-06-18 DIAGNOSIS — Z125 Encounter for screening for malignant neoplasm of prostate: Secondary | ICD-10-CM | POA: Diagnosis not present

## 2018-06-18 DIAGNOSIS — E7800 Pure hypercholesterolemia, unspecified: Secondary | ICD-10-CM

## 2018-06-18 LAB — COMPREHENSIVE METABOLIC PANEL
ALBUMIN: 3.9 g/dL (ref 3.5–5.2)
ALT: 8 U/L (ref 0–53)
AST: 11 U/L (ref 0–37)
Alkaline Phosphatase: 48 U/L (ref 39–117)
BUN: 15 mg/dL (ref 6–23)
CO2: 33 meq/L — AB (ref 19–32)
Calcium: 9.2 mg/dL (ref 8.4–10.5)
Chloride: 103 mEq/L (ref 96–112)
Creatinine, Ser: 0.9 mg/dL (ref 0.40–1.50)
GFR: 115.3 mL/min (ref >60.00)
Glucose, Bld: 94 mg/dL (ref 70–99)
Potassium: 4.9 mEq/L (ref 3.5–5.1)
SODIUM: 141 meq/L (ref 135–145)
TOTAL PROTEIN: 7.5 g/dL (ref 6.0–8.3)
Total Bilirubin: 0.6 mg/dL (ref 0.2–1.2)

## 2018-06-18 LAB — LIPID PANEL
CHOL/HDL RATIO: 2
CHOLESTEROL: 119 mg/dL (ref 0–200)
HDL: 68.8 mg/dL (ref >39.00)
LDL Cholesterol: 43 mg/dL (ref 0–99)
NonHDL: 50.69
TRIGLYCERIDES: 38 mg/dL (ref 0.0–149.0)
VLDL: 7.6 mg/dL (ref 0.0–40.0)

## 2018-06-18 LAB — HEMOGLOBIN A1C: Hgb A1c MFr Bld: 5.6 % (ref 4.6–6.5)

## 2018-06-18 LAB — CBC WITH DIFFERENTIAL/PLATELET
Basophils Absolute: 0 10*3/uL (ref 0.0–0.1)
Basophils Relative: 0.9 % (ref 0.0–3.0)
EOS PCT: 2.5 % (ref 0.0–5.0)
Eosinophils Absolute: 0.1 10*3/uL (ref 0.0–0.7)
HCT: 38.6 % — ABNORMAL LOW (ref 39.0–52.0)
HEMOGLOBIN: 12.5 g/dL — AB (ref 13.0–17.0)
LYMPHS ABS: 1.4 10*3/uL (ref 0.7–4.0)
Lymphocytes Relative: 32.1 % (ref 12.0–46.0)
MCHC: 32.4 g/dL (ref 30.0–36.0)
MCV: 89.1 fl (ref 78.0–100.0)
MONO ABS: 0.6 10*3/uL (ref 0.1–1.0)
MONOS PCT: 12.9 % — AB (ref 3.0–12.0)
Neutro Abs: 2.2 10*3/uL (ref 1.4–7.7)
Neutrophils Relative %: 51.6 % (ref 43.0–77.0)
Platelets: 210 10*3/uL (ref 150.0–400.0)
RBC: 4.33 Mil/uL (ref 4.22–5.81)
RDW: 13.1 % (ref 11.5–15.5)
WBC: 4.3 10*3/uL (ref 4.0–10.5)

## 2018-06-18 LAB — MICROALBUMIN / CREATININE URINE RATIO
Creatinine,U: 135.5 mg/dL
Microalb Creat Ratio: 0.9 mg/g (ref 0.0–30.0)
Microalb, Ur: 1.2 mg/dL (ref 0.0–1.9)

## 2018-06-18 LAB — PSA: PSA: 0.17 ng/mL (ref 0.10–4.00)

## 2018-06-18 NOTE — Telephone Encounter (Signed)
Papers given to nurse for provider to sign.

## 2018-06-18 NOTE — Telephone Encounter (Signed)
Patient came by office  Filled out application for disability parking placard Would like for provider to complete and sign Placed in nurse box

## 2018-06-23 NOTE — Telephone Encounter (Signed)
Dr End signed and completed form. Notified patient that it is ready. He will come by office and pick up today sometime.

## 2018-06-26 ENCOUNTER — Encounter: Payer: Self-pay | Admitting: Family Medicine

## 2018-06-26 ENCOUNTER — Ambulatory Visit (INDEPENDENT_AMBULATORY_CARE_PROVIDER_SITE_OTHER): Payer: 59 | Admitting: Family Medicine

## 2018-06-26 VITALS — BP 126/90 | HR 82 | Temp 98.6°F | Ht 73.0 in | Wt 186.0 lb

## 2018-06-26 DIAGNOSIS — E7800 Pure hypercholesterolemia, unspecified: Secondary | ICD-10-CM | POA: Diagnosis not present

## 2018-06-26 DIAGNOSIS — I428 Other cardiomyopathies: Secondary | ICD-10-CM | POA: Diagnosis not present

## 2018-06-26 DIAGNOSIS — E119 Type 2 diabetes mellitus without complications: Secondary | ICD-10-CM

## 2018-06-26 DIAGNOSIS — I10 Essential (primary) hypertension: Secondary | ICD-10-CM

## 2018-06-26 DIAGNOSIS — I5022 Chronic systolic (congestive) heart failure: Secondary | ICD-10-CM

## 2018-06-26 LAB — HM DIABETES FOOT EXAM

## 2018-06-26 NOTE — Assessment & Plan Note (Signed)
Very tight control.. Some lows at night.  Hold bedtime dose of metformin.

## 2018-06-26 NOTE — Assessment & Plan Note (Signed)
Euvolemic. On BALDS therapy

## 2018-06-26 NOTE — Assessment & Plan Note (Signed)
Stable SOB. 

## 2018-06-26 NOTE — Assessment & Plan Note (Signed)
Well controlled. Continue current medication.  

## 2018-06-26 NOTE — Progress Notes (Signed)
   Subjective:    Patient ID: Philip Ponce, male    DOB: 21-Sep-1971, 47 y.o.   MRN: [id number]  HPI  47 year old male presents for follow up DM  Diabetes:   A1C almost normal. On toujeo  15 units daily and metformin ( has decreased to 1500 mg) Lab Results  Component Value Date   HGBA1C 5.6 06/18/2018  Using medications without difficulties: Hypoglycemic episodes: 1 AM 70, off and on.. Has to eat something in middle of night. Hyperglycemic episodes: none Feet problems:none Blood Sugars averaging: FBS eye exam within last year:  He has been more active during the day.  He has signed up to the gym.  Hypertension:     Good control on carvedilol, entresto, spironolactone BP Readings from Last 3 Encounters:  06/26/18 126/90  05/30/18 92/62  04/09/18 124/80   Using medication without problems or lightheadedness: noneChest pain with exertion: none Edema:none Short of breath: stable Average home BPs: Other issues:  Elevated Cholesterol:  Good control on no med. Lab Results  Component Value Date   CHOL 119 06/18/2018   HDL 68.80 06/18/2018   LDLCALC 43 06/18/2018   TRIG 38.0 06/18/2018   CHOLHDL 2 06/18/2018  Using medications without problems: Muscle aches: none Diet compliance: good Exercise: active.. Going back to the gym Other complaints:  Nonischemic cardiomyopathy and Chronic systolic heart failure: Currently euvolemic Followed by cardiology. ON BBlocker, ACEI/ARB and spironolactone. Wt Readings from Last 3 Encounters:  06/26/18 186 lb (84.4 kg)  05/30/18 188 lb 12 oz (85.6 kg)  04/09/18 181 lb (82.1 kg)     Blood pressure 126/90, pulse 82, temperature 98.6 F (37 C), temperature source Oral, height 6\' 1"  (1.854 m), weight 186 lb (84.4 kg). Social History /Family History/Past Medical History reviewed in detail and updated in EMR if needed.   Review of Systems  Constitutional: Negative for fatigue and fever.  HENT: Negative for ear pain.   Eyes:  Negative for pain.  Respiratory: Positive for shortness of breath. Negative for cough.   Cardiovascular: Negative for chest pain, palpitations and leg swelling.  Gastrointestinal: Negative for abdominal pain.  Genitourinary: Negative for dysuria.  Musculoskeletal: Negative for arthralgias.  Neurological: Negative for syncope, light-headedness and headaches.  Psychiatric/Behavioral: Negative for dysphoric mood.       Objective:   Physical Exam  Constitutional: Vital signs are normal. He appears well-developed and well-nourished.  HENT:  Head: Normocephalic.  Right Ear: Hearing normal.  Left Ear: Hearing normal.  Nose: Nose normal.  Mouth/Throat: Oropharynx is clear and moist and mucous membranes are normal.  Neck: Trachea normal. Carotid bruit is not present. No thyroid mass and no thyromegaly present.  Cardiovascular: Normal rate, regular rhythm and normal pulses. Exam reveals no gallop, no distant heart sounds and no friction rub.  No murmur heard. No peripheral edema  Pulmonary/Chest: Effort normal and breath sounds normal. No respiratory distress.  Skin: Skin is warm, dry and intact. No rash noted.  Psychiatric: He has a normal mood and affect. His speech is normal and behavior is normal. Thought content normal.     Diabetic foot exam: Normal inspection No skin breakdown No calluses  Normal DP pulses Normal sensation to light touch and monofilament Nails normal      Assessment & Plan:

## 2018-06-26 NOTE — Patient Instructions (Addendum)
Hold metformin at bedtime.. Take 2 tabs in the AM.  Follow blood sugars in AM  Fasting.. Goal < 120.  Keep up with the healthy lifestyle.

## 2018-06-28 ENCOUNTER — Other Ambulatory Visit: Payer: Self-pay

## 2018-06-28 ENCOUNTER — Encounter (HOSPITAL_COMMUNITY): Payer: Self-pay

## 2018-06-28 ENCOUNTER — Emergency Department (HOSPITAL_COMMUNITY)
Admission: EM | Admit: 2018-06-28 | Discharge: 2018-06-28 | Disposition: A | Discharge status: Home / Self Care | Payer: 59 | Attending: Emergency Medicine | Admitting: Emergency Medicine

## 2018-06-28 DIAGNOSIS — Z7984 Long term (current) use of oral hypoglycemic drugs: Secondary | ICD-10-CM | POA: Insufficient documentation

## 2018-06-28 DIAGNOSIS — Z79899 Other long term (current) drug therapy: Secondary | ICD-10-CM | POA: Insufficient documentation

## 2018-06-28 DIAGNOSIS — I5042 Chronic combined systolic (congestive) and diastolic (congestive) heart failure: Secondary | ICD-10-CM | POA: Diagnosis not present

## 2018-06-28 DIAGNOSIS — I110 Hypertensive heart disease with heart failure: Secondary | ICD-10-CM | POA: Diagnosis not present

## 2018-06-28 DIAGNOSIS — R55 Syncope and collapse: Secondary | ICD-10-CM | POA: Diagnosis not present

## 2018-06-28 DIAGNOSIS — E119 Type 2 diabetes mellitus without complications: Secondary | ICD-10-CM | POA: Insufficient documentation

## 2018-06-28 DIAGNOSIS — R410 Disorientation, unspecified: Secondary | ICD-10-CM | POA: Diagnosis not present

## 2018-06-28 DIAGNOSIS — F10929 Alcohol use, unspecified with intoxication, unspecified: Secondary | ICD-10-CM | POA: Diagnosis not present

## 2018-06-28 DIAGNOSIS — F10920 Alcohol use, unspecified with intoxication, uncomplicated: Secondary | ICD-10-CM

## 2018-06-28 DIAGNOSIS — E1165 Type 2 diabetes mellitus with hyperglycemia: Secondary | ICD-10-CM | POA: Diagnosis not present

## 2018-06-28 LAB — URINALYSIS, ROUTINE W REFLEX MICROSCOPIC
Bacteria, UA: NONE SEEN
Bilirubin Urine: NEGATIVE
GLUCOSE, UA: NEGATIVE mg/dL
Ketones, ur: NEGATIVE mg/dL
Leukocytes, UA: NEGATIVE
Nitrite: NEGATIVE
PROTEIN: NEGATIVE mg/dL
SPECIFIC GRAVITY, URINE: 1.016 (ref 1.005–1.030)
pH: 5 (ref 5.0–8.0)

## 2018-06-28 LAB — CBC
HEMATOCRIT: 36 % — AB (ref 39.0–52.0)
HEMOGLOBIN: 11 g/dL — AB (ref 13.0–17.0)
MCH: 28.3 pg (ref 26.0–34.0)
MCHC: 30.6 g/dL (ref 30.0–36.0)
MCV: 92.5 fL (ref 78.0–100.0)
PLATELETS: 195 10*3/uL (ref 150–400)
RBC: 3.89 MIL/uL — ABNORMAL LOW (ref 4.22–5.81)
RDW: 12.4 % (ref 11.5–15.5)
WBC: 6.7 10*3/uL (ref 4.0–10.5)

## 2018-06-28 LAB — BASIC METABOLIC PANEL
Anion gap: 11 (ref 5–15)
BUN: 18 mg/dL (ref 6–20)
CALCIUM: 8.1 mg/dL — AB (ref 8.9–10.3)
CO2: 20 mmol/L — AB (ref 22–32)
Chloride: 102 mmol/L (ref 98–111)
Creatinine, Ser: 0.94 mg/dL (ref 0.61–1.24)
GFR calc Af Amer: >60 mL/min (ref >60)
GFR calc non Af Amer: >60 mL/min (ref >60)
GLUCOSE: 192 mg/dL — AB (ref 70–99)
Potassium: 4.1 mmol/L (ref 3.5–5.1)
Sodium: 133 mmol/L — ABNORMAL LOW (ref 135–145)

## 2018-06-28 LAB — I-STAT TROPONIN, ED: TROPONIN I, POC: 0 ng/mL (ref 0.00–0.08)

## 2018-06-28 LAB — ETHANOL: Alcohol, Ethyl (B): 248 mg/dL — ABNORMAL HIGH (ref <10)

## 2018-06-28 NOTE — Discharge Instructions (Addendum)
Follow-up with your primary doctor if you experience any additional problems. °

## 2018-06-28 NOTE — ED Triage Notes (Signed)
Per [facility], pt from the [facility]. It was reported that the pt had "a lot to drink" and then had a syncopal episode from a standing position. It is unknown how long the pt was unresponsive. No seizure like activity reported. No N/V/D. Pt denied complaints of head, neck, or back, pain. No injuries noted.

## 2018-06-28 NOTE — ED Provider Notes (Signed)
[facility] EMERGENCY DEPARTMENT Provider Note   CSN: [id number] Arrival date & time: 06/28/18  0214     History   Chief Complaint Chief Complaint  Patient presents with  . Loss of Consciousness  . Alcohol Intoxication    HPI Philip Ponce is a 48 y.o. male.  Patient is a 47 year old male with past medical history of cardiomyopathy with reduced EF requiring pacer/defibrillator.  He presents for evaluation of an unresponsive episode that occurred this evening while drinking alcohol.  He was doing shots with his cousin this evening when he became unresponsive for a brief period of time.  He was brought here for evaluation.  He denies any chest pain or difficulty breathing.  He denies any headache or neck pain.  He denies that his defibrillator went off.  The history is provided by the patient.  Loss of Consciousness   This is a new problem. The current episode started 1 to 2 hours ago. The problem has been resolved. He lost consciousness for a period of less than one minute. Associated with: Aching alcohol.  Alcohol Intoxication     Past Medical History:  Diagnosis Date  . Acute blood loss anemia 11/20/2016  . 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 Chronic combined systolic (congestive) and diastolic (congestive) heart failure ([place])    a. 11/2016 Ehco: EF 20-25%, glob HK, antsept, ant, apical HK, Gr1 DD, mild MR, mildly dil LA, nl RV fxn; b. 05/2017 Echo: Ef 20-25%, Gr2 DD, prominent apical trabeculations.  . Diabetes mellitus ([place])   . Family history of adverse reaction to anesthesia    mom had a hard time waking up  . GERD (gastroesophageal reflux disease)   . NICM (nonischemic cardiomyopathy) ([place])    a. 11/2016 Cath: nl cors, EF 25-30%; b. 11/2016 Echo: EF 20-25%; c. 05/2017 Echo: EF 20-25%.  . Slipped intervertebral disc    L4 L5  . STEMI (ST elevation myocardial infarction) ([place])    a. 11/2016 ST elevation -->Nl cors  on cath.    Patient Active Problem List   Diagnosis Date Noted  . Bilateral chronic knee pain 05/30/2018  . Status post biventricular pacemaker 10/09/2017  . S/P left rotator cuff repair 10/04/2017  . Cervical radiculopathy 07/24/2017  . Rotator cuff tendonitis, left 05/06/2017  . Chronic systolic heart failure ([place]) 12/18/2016  . Non-ischemic cardiomyopathy ([place]) 12/04/2016  . Iron deficiency anemia   . History of bariatric surgery 11/20/2016  . Toenail fungus 10/14/2015  . RBBB 04/07/2015  . LBBB (left bundle branch block) 09/24/2013  . Obstructive apnea 09/24/2013  . GERD 09/29/2010  . 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 Essential hypertension, benign 07/09/2007  . HYPERCHOLESTEROLEMIA, PURE 06/12/2007  . Diabetes mellitus with no complication ([place]) 42/35/3614    Past Surgical History:  Procedure Laterality Date  . BIV ICD INSERTION CRT-D  06/26/2017  . BIV ICD INSERTION CRT-D N/A 06/26/2017   Procedure: BIV ICD INSERTION CRT-D;  Surgeon: [name], MD;  Location: [facility];  Service: Cardiovascular;  Laterality: N/A;  . CERVICAL DISC ARTHROPLASTY N/A 07/24/2017   Procedure: CERVICAL ANTERIOR [place] ARTHROPLASTY C5-C7;  Surgeon: [name], MD;  Location: [facility] ORS;  Service: Neurosurgery;  Laterality: N/A;  . COLONOSCOPY WITH PROPOFOL N/A 12/03/2016   Procedure: COLONOSCOPY WITH PROPOFOL;  Surgeon: [name], MD;  Location: [facility] ENDOSCOPY;  Service: Endoscopy;  Laterality: N/A;  . ESOPHAGOGASTRODUODENOSCOPY (EGD) WITH PROPOFOL N/A 12/03/2016   Procedure: ESOPHAGOGASTRODUODENOSCOPY (EGD) WITH  PROPOFOL;  Surgeon: [name], MD;  Location: [facility] ENDOSCOPY;  Service: Endoscopy;  Laterality: N/A;  . FINGER SURGERY Right 2012   index finger  . GASTRIC BYPASS    . GIVENS CAPSULE STUDY N/A 01/07/2017   Procedure: GIVENS CAPSULE STUDY;  Surgeon: [name], MD;  Location: [facility] ENDOSCOPY;  Service: Endoscopy;  Laterality: N/A;  . LEFT HEART CATH AND  CORONARY ANGIOGRAPHY N/A 12/04/2016   Procedure: Left Heart Cath and Coronary Angiography;  Surgeon: [name], MD;  Location: [facility] LAB;  Service: Cardiovascular;  Laterality: N/A;  . SHOULDER ARTHROSCOPY WITH OPEN ROTATOR CUFF REPAIR Left 10/04/2017   Procedure: SHOULDER ARTHROSCOPY WITH ROTATOR CUFF REPAIR, SUBACROMIAL DECOMPRESSION,OPEN BICEP TENODESIS, EXTENSIVE DEBRIDEMENT;  Surgeon: [name], MD;  Location: [facility] ORS;  Service: Orthopedics;  Laterality: Left;        Home Medications    Prior to Admission medications   Medication Sig Start Date End Date Taking? Authorizing Provider  calcium carbonate (OS-CAL) 600 MG TABS tablet Take 1,200 mg by mouth daily with breakfast.     [provider]  carvedilol (COREG) 12.5 MG tablet Take 12.5 mg by mouth 2 (two) times daily with a meal.    [provider]  glucose blood (ONE TOUCH ULTRA TEST) test strip Check blood sugar once daily and as instructed. Dx E11.9 05/14/18   [name], MD  Insulin Glargine (TOUJEO SOLOSTAR) 300 UNIT/ML SOPN Inject 15 Units into the skin daily. 01/13/18   Bedsole, Amy E, MD  metFORMIN (GLUCOPHAGE) 500 MG tablet Take 2 tablet by mouth every morning and 1 tablet by mouth every evening    [provider]  metFORMIN (GLUCOPHAGE) 500 MG tablet Take 1,000 mg by mouth daily with breakfast. Not on PM dose given bedtime lows    [provider]  Multiple Vitamins-Minerals (ADULT GUMMY PO) Take 2 tablets by mouth daily.    [provider]  Omega-3 Fatty Acids (FISH OIL PO) Take 800 mg by mouth daily.    [provider]  omeprazole (PRILOSEC) 40 MG capsule Take 40 mg by mouth daily as needed (indigestion).     [provider]  sacubitril-valsartan (ENTRESTO) 97-103 MG Take 1 tablet by mouth 2 (two) times daily. 10/09/17   End, Harrell Gave, MD  spironolactone (ALDACTONE) 25 MG tablet Take 1 tablet (25 mg total) by mouth daily. 06/16/18 09/14/18   End, Harrell Gave, MD    Family History Family History  Problem Relation Age of Onset  . Diabetes Mother   . Hypertension Mother   . Cancer Mother        breast  . Diabetes Father   . Hypertension Father   . Cancer Father        colon  . Cancer Maternal Grandfather        prostate  . Stroke Maternal Grandfather        CVA    Social History Social History   Tobacco Use  . Smoking status: Never Smoker  . Smokeless tobacco: Never Used  Substance Use Topics  . Alcohol use: Yes    Frequency: Never    Comment: socially  . Drug use: No     Allergies   Mobic [meloxicam]; Tramadol; and Diclofenac   Review of Systems Review of Systems  Cardiovascular: Positive for syncope.  All other systems reviewed and are negative.    Physical Exam Updated Vital Signs BP 115/81   Pulse 87   Resp 18   Ht 6\' 1"  (  1.854 m)   Wt 85.3 kg   SpO2 98%   BMI 24.80 kg/m   Physical Exam  Constitutional: He is oriented to person, place, and time. He appears well-developed and well-nourished. No distress.  Strong odor of alcohol is present.  HENT:  Head: Normocephalic and atraumatic.  Mouth/Throat: Oropharynx is clear and moist.  Neck: Normal range of motion. Neck supple.  Cardiovascular: Normal rate and regular rhythm. Exam reveals no friction rub.  No murmur heard. Pulmonary/Chest: Effort normal and breath sounds normal. No respiratory distress. He has no wheezes. He has no rales.  Abdominal: Soft. Bowel sounds are normal. He exhibits no distension. There is no tenderness.  Musculoskeletal: Normal range of motion. He exhibits no edema.  Neurological: He is alert and oriented to person, place, and time. Coordination normal.  Skin: Skin is warm and dry. He is not diaphoretic.  Nursing note and vitals reviewed.    ED Treatments / Results  Labs (all labs ordered are listed, but only abnormal results are displayed) Labs Reviewed  BASIC METABOLIC PANEL - Abnormal; Notable for the  following components:      Result Value   Sodium 133 (*)    CO2 20 (*)    Glucose, Bld 192 (*)    Calcium 8.1 (*)    All other components within normal limits  CBC - Abnormal; Notable for the following components:   RBC 3.89 (*)    Hemoglobin 11.0 (*)    HCT 36.0 (*)    All other components within normal limits  URINALYSIS, ROUTINE W REFLEX MICROSCOPIC  ETHANOL  I-STAT TROPONIN, ED  CBG MONITORING, ED    EKG EKG Interpretation  Date/Time:  Saturday June 28 2018 02:20:42 EDT Ventricular Rate:  87 PR Interval:    QRS Duration: 146 QT Interval:  408 QTC Calculation: 491 R Axis:   90 Text Interpretation:  Sinus rhythm Left atrial enlargement Nonspecific intraventricular conduction delay Probable lateral infarct, age indeterminate Abnrm T, consider ischemia, anterolateral lds Confirmed by Veryl Speak [phone]) on 06/28/2018 4:11:05 AM   Radiology No results found.  Procedures Procedures (including critical care time)  Medications Ordered in ED Medications - No data to display   Initial Impression / Assessment and Plan / ED Course  I have reviewed the triage vital signs and the nursing notes.  Pertinent labs & imaging results that were available during my care of the patient were reviewed by me and considered in my medical decision making (see chart for details).  Patient presents after an unresponsive episode that occurred while drinking alcohol.  He is apparently drinking heavily this evening and blood alcohol is 248.  He has been observed in the ER and is now feeling better.  He has had no arrhythmia or ectopy.  I highly doubt any emergent process.  He will be discharged, to return as needed.  Final Clinical Impressions(s) / ED Diagnoses   Final diagnoses:  None    ED Discharge Orders    None       Veryl Speak, MD 06/28/18 [phone]

## 2018-07-01 ENCOUNTER — Ambulatory Visit (INDEPENDENT_AMBULATORY_CARE_PROVIDER_SITE_OTHER): Payer: 59 | Admitting: *Deleted

## 2018-07-01 DIAGNOSIS — I428 Other cardiomyopathies: Secondary | ICD-10-CM

## 2018-07-01 DIAGNOSIS — I5022 Chronic systolic (congestive) heart failure: Secondary | ICD-10-CM

## 2018-07-02 NOTE — Progress Notes (Signed)
Remote ICD transmission.   

## 2018-07-03 ENCOUNTER — Ambulatory Visit (INDEPENDENT_AMBULATORY_CARE_PROVIDER_SITE_OTHER): Payer: 59 | Admitting: Internal Medicine

## 2018-07-03 ENCOUNTER — Encounter: Payer: Self-pay | Admitting: Internal Medicine

## 2018-07-03 VITALS — BP 140/82 | HR 73 | Ht 73.0 in | Wt 188.2 lb

## 2018-07-03 DIAGNOSIS — I428 Other cardiomyopathies: Secondary | ICD-10-CM

## 2018-07-03 DIAGNOSIS — I5022 Chronic systolic (congestive) heart failure: Secondary | ICD-10-CM

## 2018-07-03 DIAGNOSIS — I10 Essential (primary) hypertension: Secondary | ICD-10-CM | POA: Diagnosis not present

## 2018-07-03 NOTE — Progress Notes (Signed)
Follow-up Outpatient Visit Date: 07/03/2018  Primary Care Provider: [name], MD [place] [address]  Chief Complaint: Heart failure  HPI:  [name] is a 47 y.o. year-old male with history of chronic systolic heart failure secondary to nonischemic cardiomyopathy, hypertension, and type 2 diabetes mellitus, who presents for follow-up of chronic systolic heart failure.  I last saw [name] in July, at which time he reported exertional dyspnea with modest activity as well as fatigue and somnolence in the afternoon.  He also noted continued intermittent chest wall stimulation from the LV lead of his biventricular pacemaker despite multiple attempts at reprogramming by EP.  He was seen in the emergency department 5 days ago in the setting of an unresponsive episode lasting less than 1 minute in the setting of alcohol intoxication.  Today, [name] reports that he is feeling almost back to normal.  He is somewhat depressed because of his chronic medical conditions, particularly his cardiomyopathy.  He is planning to speak with a mental health provider about this.  He has not had any chest pain.  He has stable mild exertional dyspnea but happily reports that he has joined a gym and is now using a treadmill more regularly.  He is able to walk for 10 to 12 minutes at a slow pace without difficulty.  He has not had palpitations, lightheadedness, or edema.  He continues to note occasional extra cardiac stimulation from his LV lead, though it seems to be less frequent than in the past.  He is tolerating his current medication regimen well.  --------------------------------------------------------------------------------------------------  Cardiovascular History & Procedures: Cardiovascular Problems:  Chronic systolic heart failure secondary to nonischemic cardiomyopathy  Risk Factors:  Hypertension, diabetes mellitus, and male gender  Cath/PCI:  LHC  (12/04/16): No angiographically significant CAD. Moderate to severe LV contractile dysfunction (LVEF 30-35%) with mildly elevated left ventricular filling pressure.  LHC (09/28/13, [facility]): No significant CAD. LVEF 45%.  CV Surgery:  None  EP Procedures and Devices:  BiV-ICD placement (06/26/17, Dr. Curt Bears): [employer]  Non-Invasive Evaluation(s):  Limited TTE (01/06/18): Mildly dilated LV with normal wall thickness. LVEF 30-35% with grade 1 diastolic dysfunction and global hypokinesis.  TTE (05/28/17): Moderately dilated LV with normal wall thickness. LVEF 20-25% with diffuse hypokinesis and grade 2 diastolic dysfunction. Normal RV size and function.  TTE (12/05/16): Moderately dilated left ventricle with LVEF of 20-25% and global hypokinesis. Grade 1 diastolic dysfunction. Mild MR. Mild left atrial enlargement. Normal RV size and function.  TTE (04/14/15): Normal LV size with mild LVH. LVEF 50-55% with normal wall motion. Grade 1 diastolic dysfunction. Mild left atrial enlargement. Normal RV size and function. No significant valvular abnormalities.  Recent CV Pertinent Labs: Lab Results  Component Value Date   CHOL 119 06/18/2018   HDL 68.80 06/18/2018   LDLCALC 43 06/18/2018   TRIG 38.0 06/18/2018   CHOLHDL 2 06/18/2018   INR 1.11 07/18/2017   INR 1.0 08/04/2012   K 4.1 06/28/2018   K 3.9 08/04/2012   MG 1.9 12/06/2016   MG 2.0 08/04/2012   BUN 18 06/28/2018   BUN 12 01/15/2018   BUN 21 (H) 08/04/2012   CREATININE 0.94 06/28/2018   CREATININE 0.98 11/23/2016    Past medical and surgical history were reviewed and updated in EPIC.  Current Meds  Medication Sig  . amoxicillin (AMOXIL) 500 MG capsule Take 500 mg by mouth every 6 (six) hours.  . calcium carbonate (OS-CAL) 600 MG TABS  tablet Take 1,200 mg by mouth daily with breakfast.   . carvedilol (COREG) 12.5 MG tablet Take 12.5 mg by mouth 2 (two) times daily with a meal.  . glucose blood (ONE TOUCH ULTRA TEST) test strip  Check blood sugar once daily and as instructed. Dx E11.9  . ibuprofen (ADVIL,MOTRIN) 600 MG tablet Take 600 mg by mouth every 6 (six) hours as needed. for pain  . Insulin Glargine (TOUJEO SOLOSTAR) 300 UNIT/ML SOPN Inject 15 Units into the skin daily.  . metFORMIN (GLUCOPHAGE) 500 MG tablet Take 1,000 mg by mouth daily with breakfast.   . Multiple Vitamins-Minerals (ADULT GUMMY PO) Take 2 tablets by mouth daily.  . Omega-3 Fatty Acids (FISH OIL PO) Take 800 mg by mouth daily.  Marland Kitchen omeprazole (PRILOSEC) 40 MG capsule Take 40 mg by mouth daily as needed (indigestion).   . sacubitril-valsartan (ENTRESTO) 97-103 MG Take 1 tablet by mouth 2 (two) times daily.  Marland Kitchen spironolactone (ALDACTONE) 25 MG tablet Take 1 tablet (25 mg total) by mouth daily.    Allergies: Mobic [meloxicam]; Tramadol; and Diclofenac  Social History   Tobacco Use  . Smoking status: Never Smoker  . Smokeless tobacco: Never Used  Substance Use Topics  . Alcohol use: Yes    Frequency: Never    Comment: socially  . Drug use: No    Family History  Problem Relation Age of Onset  . Diabetes Mother   . Hypertension Mother   . Cancer Mother        breast  . Diabetes Father   . Hypertension Father   . Cancer Father        colon  . Cancer Maternal Grandfather        prostate  . Stroke Maternal Grandfather        CVA    Review of Systems: A 12-system review of systems was performed and was negative except as noted in the HPI.  --------------------------------------------------------------------------------------------------  Physical Exam: BP 140/82 (BP Location: Right Arm, Patient Position: Sitting, Cuff Size: Normal)   Pulse 73   Ht 6\' 1"  (1.854 m)   Wt 188 lb 4 oz (85.4 kg)   BMI 24.84 kg/m   General: NAD. HEENT: No conjunctival pallor or scleral icterus. Moist mucous membranes.  OP clear. Neck: Supple without lymphadenopathy, thyromegaly, JVD, or HJR. Lungs: Normal work of breathing. Clear to auscultation  bilaterally without wheezes or crackles. Heart: Regular rate and rhythm without murmurs, rubs, or gallops. Non-displaced PMI. Abd: Bowel sounds present. Soft, NT/ND without hepatosplenomegaly Ext: No lower extremity edema. Radial, PT, and DP pulses are 2+ bilaterally. Skin: Warm and dry without rash.  EKG: Normal sinus rhythm with atrial sensing and biventricular pacing.  Lab Results  Component Value Date   WBC 6.7 06/28/2018   HGB 11.0 (L) 06/28/2018   HCT 36.0 (L) 06/28/2018   MCV 92.5 06/28/2018   PLT 195 06/28/2018    Lab Results  Component Value Date   NA 133 (L) 06/28/2018   K 4.1 06/28/2018   CL 102 06/28/2018   CO2 20 (L) 06/28/2018   BUN 18 06/28/2018   CREATININE 0.94 06/28/2018   GLUCOSE 192 (H) 06/28/2018   ALT 8 06/18/2018    Lab Results  Component Value Date   CHOL 119 06/18/2018   HDL 68.80 06/18/2018   LDLCALC 43 06/18/2018   TRIG 38.0 06/18/2018   CHOLHDL 2 06/18/2018    --------------------------------------------------------------------------------------------------  ASSESSMENT AND PLAN: Chronic systolic heart failure secondary to nonischemic cardiomyopathy Mr.  Ascencio appears euvolemic and well compensated with NYHA class II symptoms.  He seems to be gradually improving from a functional capacity standpoint.  We will continue current doses of carvedilol, Entresto, and spironolactone.  He has not tolerated higher doses of carvedilol in the past due to fatigue.  Given that he is now class II, we will defer adding BiDil, though this could be considered in the future.  I advised him to refrain from consuming all alcohol, given potential for cardiotoxic effects of alcohol.  Labs obtained his past weekend showed normal renal function and potassium.  He will continue ongoing follow-up in the device clinic.  Hypertension Blood pressure mildly elevated today but typically normal or borderline low in the past.  We will defer medication changes  today.  Follow-up: Return to clinic in 4 months.  [name], MD 07/03/2018 9:59 AM

## 2018-07-03 NOTE — Patient Instructions (Signed)
Medication Instructions:  Your physician recommends that you continue on your current medications as directed. Please refer to the Current Medication list given to you today.  If you need a refill on your cardiac medications before your next appointment, please call your pharmacy.   Lab work: NONE If you have labs (blood work) drawn today and your tests are completely normal, you will receive your results only by: Marland Kitchen MyChart Message (if you have MyChart) OR . A paper copy in the mail If you have any lab test that is abnormal or we need to change your treatment, we will call you to review the results.  Testing/Procedures: NONE  Follow-Up: You will need a follow up appointment in 4 months.  Please call our office 2 months in advance to schedule this appointment.  You may see [name], MD or one of the following Advanced Practice Providers on your designated Care Team:   Murray Hodgkins, NP Christell Faith, PA-C . Marrianne Mood, PA-C  At [facility], you and your health needs are our priority.  As part of our continuing mission to provide you with exceptional heart care, we have created designated Provider Care Teams.  These Care Teams include your primary Cardiologist (physician) and Advanced Practice Providers (APPs -  Physician Assistants and Nurse Practitioners) who all work together to provide you with the care you need, when you need it.   Any Other Special Instructions Will Be Listed Below (If Applicable).  Your provider recommends that you stop drinking alcohol.  Your provider recommends that you exercise on a regular basis.

## 2018-07-09 ENCOUNTER — Encounter: Payer: Self-pay | Admitting: Cardiology

## 2018-08-06 DIAGNOSIS — M75122 Complete rotator cuff tear or rupture of left shoulder, not specified as traumatic: Secondary | ICD-10-CM | POA: Diagnosis not present

## 2018-08-06 LAB — CUP PACEART REMOTE DEVICE CHECK
Battery Remaining Longevity: 100 mo
Battery Voltage: 3 V
Brady Statistic AP VP Percent: 0.03 %
Brady Statistic RA Percent Paced: 0.04 %
Brady Statistic RV Percent Paced: 40.44 %
Date Time Interrogation Session: 20191008062604
HIGH POWER IMPEDANCE MEASURED VALUE: 48 Ohm
Implantable Lead Implant Date: 20181003
Implantable Lead Implant Date: 20181003
Implantable Lead Location: 753858
Implantable Lead Location: 753859
Implantable Lead Model: 4398
Implantable Pulse Generator Implant Date: 20181003
Lead Channel Impedance Value: 166.25 Ohm
Lead Channel Impedance Value: 199.5 Ohm
Lead Channel Impedance Value: 204.14 Ohm
Lead Channel Impedance Value: 285 Ohm
Lead Channel Impedance Value: 418 Ohm
Lead Channel Impedance Value: 418 Ohm
Lead Channel Impedance Value: 589 Ohm
Lead Channel Impedance Value: 703 Ohm
Lead Channel Pacing Threshold Amplitude: 0.5 V
Lead Channel Pacing Threshold Pulse Width: 0.4 ms
Lead Channel Pacing Threshold Pulse Width: 0.4 ms
Lead Channel Setting Pacing Amplitude: 2 V
Lead Channel Setting Pacing Amplitude: 2.5 V
Lead Channel Setting Sensing Sensitivity: 0.3 mV
MDC IDC LEAD IMPLANT DT: 20181003
MDC IDC LEAD LOCATION: 753860
MDC IDC MSMT LEADCHNL LV IMPEDANCE VALUE: 166.25 Ohm
MDC IDC MSMT LEADCHNL LV IMPEDANCE VALUE: 169.459
MDC IDC MSMT LEADCHNL LV IMPEDANCE VALUE: 285 Ohm
MDC IDC MSMT LEADCHNL LV IMPEDANCE VALUE: 399 Ohm
MDC IDC MSMT LEADCHNL LV IMPEDANCE VALUE: 399 Ohm
MDC IDC MSMT LEADCHNL LV IMPEDANCE VALUE: 532 Ohm
MDC IDC MSMT LEADCHNL LV IMPEDANCE VALUE: 589 Ohm
MDC IDC MSMT LEADCHNL LV IMPEDANCE VALUE: 646 Ohm
MDC IDC MSMT LEADCHNL LV IMPEDANCE VALUE: 665 Ohm
MDC IDC MSMT LEADCHNL LV PACING THRESHOLD AMPLITUDE: 1 V
MDC IDC MSMT LEADCHNL LV PACING THRESHOLD PULSEWIDTH: 0.4 ms
MDC IDC MSMT LEADCHNL RA SENSING INTR AMPL: 2.125 mV
MDC IDC MSMT LEADCHNL RA SENSING INTR AMPL: 2.125 mV
MDC IDC MSMT LEADCHNL RV IMPEDANCE VALUE: 342 Ohm
MDC IDC MSMT LEADCHNL RV PACING THRESHOLD AMPLITUDE: 0.875 V
MDC IDC MSMT LEADCHNL RV SENSING INTR AMPL: 6.625 mV
MDC IDC MSMT LEADCHNL RV SENSING INTR AMPL: 6.625 mV
MDC IDC SET LEADCHNL LV PACING AMPLITUDE: 1.5 V
MDC IDC SET LEADCHNL LV PACING PULSEWIDTH: 0.4 ms
MDC IDC SET LEADCHNL RV PACING PULSEWIDTH: 0.4 ms
MDC IDC STAT BRADY AP VS PERCENT: 0.01 %
MDC IDC STAT BRADY AS VP PERCENT: 98.75 %
MDC IDC STAT BRADY AS VS PERCENT: 1.2 %

## 2018-08-14 ENCOUNTER — Other Ambulatory Visit: Payer: Self-pay

## 2018-08-14 ENCOUNTER — Inpatient Hospital Stay: Payer: 59

## 2018-08-14 ENCOUNTER — Inpatient Hospital Stay: Payer: 59 | Attending: Oncology | Admitting: Oncology

## 2018-08-14 ENCOUNTER — Encounter: Payer: Self-pay | Admitting: Oncology

## 2018-08-14 VITALS — BP 128/85 | HR 76 | Temp 98.5°F | Resp 18 | Ht 73.0 in | Wt 191.8 lb

## 2018-08-14 DIAGNOSIS — E538 Deficiency of other specified B group vitamins: Secondary | ICD-10-CM

## 2018-08-14 DIAGNOSIS — Z85038 Personal history of other malignant neoplasm of large intestine: Secondary | ICD-10-CM | POA: Insufficient documentation

## 2018-08-14 DIAGNOSIS — I428 Other cardiomyopathies: Secondary | ICD-10-CM | POA: Diagnosis not present

## 2018-08-14 DIAGNOSIS — D509 Iron deficiency anemia, unspecified: Secondary | ICD-10-CM

## 2018-08-14 DIAGNOSIS — E119 Type 2 diabetes mellitus without complications: Secondary | ICD-10-CM | POA: Diagnosis not present

## 2018-08-14 DIAGNOSIS — Z9884 Bariatric surgery status: Secondary | ICD-10-CM

## 2018-08-14 DIAGNOSIS — Z800 Family history of malignant neoplasm of digestive organs: Secondary | ICD-10-CM

## 2018-08-14 LAB — IRON AND TIBC
Iron: 108 ug/dL (ref 45–182)
Saturation Ratios: 39 % (ref 17.9–39.5)
TIBC: 274 ug/dL (ref 250–450)
UIBC: 166 ug/dL

## 2018-08-14 LAB — CBC WITH DIFFERENTIAL/PLATELET
ABS IMMATURE GRANULOCYTES: 0.01 10*3/uL (ref 0.00–0.07)
BASOS PCT: 1 %
Basophils Absolute: 0.1 10*3/uL (ref 0.0–0.1)
EOS ABS: 0.1 10*3/uL (ref 0.0–0.5)
Eosinophils Relative: 3 %
HCT: 39.8 % (ref 39.0–52.0)
Hemoglobin: 12.5 g/dL — ABNORMAL LOW (ref 13.0–17.0)
IMMATURE GRANULOCYTES: 0 %
Lymphocytes Relative: 30 %
Lymphs Abs: 1.3 10*3/uL (ref 0.7–4.0)
MCH: 27.7 pg (ref 26.0–34.0)
MCHC: 31.4 g/dL (ref 30.0–36.0)
MCV: 88.1 fL (ref 80.0–100.0)
MONOS PCT: 13 %
Monocytes Absolute: 0.6 10*3/uL (ref 0.1–1.0)
NEUTROS ABS: 2.4 10*3/uL (ref 1.7–7.7)
NEUTROS PCT: 53 %
PLATELETS: 213 10*3/uL (ref 150–400)
RBC: 4.52 MIL/uL (ref 4.22–5.81)
RDW: 11.9 % (ref 11.5–15.5)
WBC: 4.4 10*3/uL (ref 4.0–10.5)
nRBC: 0 % (ref 0.0–0.2)

## 2018-08-14 LAB — FERRITIN: FERRITIN: 135 ng/mL (ref 24–336)

## 2018-08-14 NOTE — Progress Notes (Signed)
No new changes noted today 

## 2018-08-14 NOTE — Progress Notes (Signed)
Hematology/Oncology Consult note [facility]  [phone] [phone]  Patient Care Team: [name], MD as PCP - General End, Harrell Gave, MD as PCP - Cardiology (Cardiology) Alisa Graff, FNP as Nurse Practitioner (Family Medicine)   Name of the patient: Philip Ponce  [id number]  07-Jan-1971   Date of visit: 08/14/18  Diagnosis- 1.  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  B12 deficiency  Chief complaint/ Reason for visit-routine follow-up of iron deficiency anemia  Heme/Onc history: patient is a 47 year old male who was seen by [name] for symptoms of bright red blood per rectum. Patient also had some epigastric pain in the past and was on NSAIDs at that time which was stopped. Patient's father died of colon cancer. Patient has a history of gastric bypass about 3 years ago. Last CBC from 12/05/2016 showed white count of 4.9, H&H of 8.1/24.1 and a platelet count of 349. Ferritin was low at 8. Serum iron was low at 15 and iron saturation was low at 5%. TIBC was normal at 295. H pylori stool antigen was negative. B12 folate and TSH were within normal limits. EGD on 12/03/2016 showed: Esophagus was normal. Evidence of gastric bypass. Otherwise normal findings of gastric bypass. Biopsies were taken for evaluation of celiac disease. Colonoscopy was normal. Patient also has a history of cardiomyopathy with an EF of 35% and during his admission for iron deficiency anemia and he was also found to have left bundle branch block and underwent cardiac catheterization which did not show any abnormalities. Patient has also had episodes of melena in February 2018 for which she was admitted to [facility] and was given PPI but did not undergo any EGD or colonoscopy at that time. Capsule study has also been completed.  pateint has received 2 doses of feraheme so far  Patient has baseline nonischemic cardiomyopathy and his recent EF from April  2019 was 30 to 35%   Interval history- overall he is doing well and denies any specific complaints today.  Energy levels are better.  Denies any blood in his stool or urine.  Denies any dark melanotic stools  ECOG PS- 1 Pain scale- 0 Opioid associated constipation- no  Review of systems- Review of Systems  Constitutional: Negative for chills, fever, malaise/fatigue and weight loss.  HENT: Negative for congestion, ear discharge and nosebleeds.   Eyes: Negative for blurred vision.  Respiratory: Negative for cough, hemoptysis, sputum production, shortness of breath and wheezing.   Cardiovascular: Negative for chest pain, palpitations, orthopnea and claudication.  Gastrointestinal: Negative for abdominal pain, blood in stool, constipation, diarrhea, heartburn, melena, nausea and vomiting.  Genitourinary: Negative for dysuria, flank pain, frequency, hematuria and urgency.  Musculoskeletal: Negative for back pain, joint pain and myalgias.  Skin: Negative for rash.  Neurological: Negative for dizziness, tingling, focal weakness, seizures, weakness and headaches.  Endo/Heme/Allergies: Does not bruise/bleed easily.  Psychiatric/Behavioral: Negative for depression and suicidal ideas. The patient does not have insomnia.       Allergies  Allergen Reactions  . Mobic [Meloxicam] Other (See Comments)    Ulcers in stomach eruption   . Tramadol     Hard to breath  . Diclofenac Palpitations and Other (See Comments)    Fast heart beat, CP,SOB and weakness on one side.     Past Medical History:  Diagnosis Date  . Acute blood loss anemia 11/20/2016  . 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 11/2016 Ehco: EF 20-25%, glob HK, antsept, ant, apical HK, Gr1 DD, mild MR, mildly dil LA, nl RV fxn; b. 05/2017 Echo: Ef 20-25%, Gr2 DD, prominent apical trabeculations.  . Diabetes  mellitus ([place])   . Family history of adverse reaction to anesthesia    mom had a hard time waking up  . GERD (gastroesophageal reflux disease)   . NICM (nonischemic cardiomyopathy) ([place])    a. 11/2016 Cath: nl cors, EF 25-30%; b. 11/2016 Echo: EF 20-25%; c. 05/2017 Echo: EF 20-25%.  . Slipped intervertebral disc    L4 L5  . STEMI (ST elevation myocardial infarction) ([place])    a. 11/2016 ST elevation -->Nl cors on cath.     Past Surgical History:  Procedure Laterality Date  . BIV ICD INSERTION CRT-D  06/26/2017  . BIV ICD INSERTION CRT-D N/A 06/26/2017   Procedure: BIV ICD INSERTION CRT-D;  Surgeon: [name], MD;  Location: [place] [facility];  Service: Cardiovascular;  Laterality: N/A;  . CERVICAL DISC ARTHROPLASTY N/A 07/24/2017   Procedure: CERVICAL ANTERIOR [place] ARTHROPLASTY C5-C7;  Surgeon: [name], MD;  Location: [facility] ORS;  Service: Neurosurgery;  Laterality: N/A;  . COLONOSCOPY WITH PROPOFOL N/A 12/03/2016   Procedure: COLONOSCOPY WITH PROPOFOL;  Surgeon: [name], MD;  Location: [facility] ENDOSCOPY;  Service: Endoscopy;  Laterality: N/A;  . ESOPHAGOGASTRODUODENOSCOPY (EGD) WITH PROPOFOL N/A 12/03/2016   Procedure: ESOPHAGOGASTRODUODENOSCOPY (EGD) WITH PROPOFOL;  Surgeon: [name], MD;  Location: [facility] ENDOSCOPY;  Service: Endoscopy;  Laterality: N/A;  . FINGER SURGERY Right 2012   index finger  . GASTRIC BYPASS    . GIVENS CAPSULE STUDY N/A 01/07/2017   Procedure: GIVENS CAPSULE STUDY;  Surgeon: [name], MD;  Location: [facility] ENDOSCOPY;  Service: Endoscopy;  Laterality: N/A;  . LEFT HEART CATH AND CORONARY ANGIOGRAPHY N/A 12/04/2016   Procedure: Left Heart Cath and Coronary Angiography;  Surgeon: [name], MD;  Location: [place] [facility];  Service: Cardiovascular;  Laterality: N/A;  . SHOULDER ARTHROSCOPY WITH OPEN ROTATOR CUFF REPAIR Left 10/04/2017   Procedure: SHOULDER ARTHROSCOPY WITH ROTATOR CUFF REPAIR, SUBACROMIAL DECOMPRESSION,OPEN BICEP TENODESIS,  EXTENSIVE DEBRIDEMENT;  Surgeon: [name], MD;  Location: [facility] ORS;  Service: Orthopedics;  Laterality: Left;    Social History   Socioeconomic History  . Marital status: Married    Spouse name: Not on file  . Number of children: Not on file  . Years of education: Not on file  . Highest education level: Not on file  Occupational History  . Occupation: Teacher, early years/pre, Brewing technologist: [employer]  . Occupation: disability  Social Needs  . Financial resource strain: Not hard at all  . Food insecurity:    Worry: Never true    Inability: Never true  . Transportation needs:    Medical: No    Non-medical: No  Tobacco Use  . Smoking status: Never Smoker  . Smokeless tobacco: Never Used  Substance and Sexual Activity  . Alcohol use: Yes    Alcohol/week: 4.0 standard drinks    Types: 4 Glasses of wine per week    Frequency: Never  . Drug use: No  . Sexual activity: Yes  Lifestyle  . Physical activity:    Days per week: 0 days    Minutes per session: Not on file  . Stress: Not at all  Relationships  . Social connections:    Talks on phone: More than  three times a week    Gets together: Twice a week    Attends religious service: More than 4 times per year    Active member of club or organization: No    Attends meetings of clubs or organizations: Never    Relationship status: Married  . Intimate partner violence:    Fear of current or ex partner: No    Emotionally abused: No    Physically abused: No    Forced sexual activity: No  Other Topics Concern  . Not on file  Social History Narrative   Regular exercise-yes, walking one mile per day   Diet: fast food, diet soda, unsweeted tea    Family History  Problem Relation Age of Onset  . Diabetes Mother   . Hypertension Mother   . Cancer Mother        breast  . Diabetes Father   . Hypertension Father   . Cancer Father        colon  . Cancer Maternal Grandfather        prostate  . Stroke  Maternal Grandfather        CVA     Current Outpatient Medications:  .  calcium carbonate (OS-CAL) 600 MG TABS tablet, Take 1,200 mg by mouth daily with breakfast. , Disp: , Rfl:  .  carvedilol (COREG) 12.5 MG tablet, Take 12.5 mg by mouth 2 (two) times daily with a meal., Disp: , Rfl:  .  glucose blood (ONE TOUCH ULTRA TEST) test strip, Check blood sugar once daily and as instructed. Dx E11.9, Disp: 100 each, Rfl: 1 .  Insulin Glargine (TOUJEO SOLOSTAR) 300 UNIT/ML SOPN, Inject 15 Units into the skin daily., Disp: 6 pen, Rfl: 5 .  metFORMIN (GLUCOPHAGE) 500 MG tablet, Take 500 mg by mouth daily with breakfast. , Disp: , Rfl:  .  Multiple Vitamins-Minerals (ADULT GUMMY PO), Take 2 tablets by mouth daily., Disp: , Rfl:  .  Omega-3 Fatty Acids (FISH OIL PO), Take 800 mg by mouth daily., Disp: , Rfl:  .  omeprazole (PRILOSEC) 40 MG capsule, Take 40 mg by mouth daily as needed (indigestion). , Disp: , Rfl:  .  sacubitril-valsartan (ENTRESTO) 97-103 MG, Take 1 tablet by mouth 2 (two) times daily., Disp: 180 tablet, Rfl: 3 .  spironolactone (ALDACTONE) 25 MG tablet, Take 1 tablet (25 mg total) by mouth daily., Disp: 90 tablet, Rfl: 0 .  amoxicillin (AMOXIL) 500 MG capsule, Take 500 mg by mouth every 6 (six) hours., Disp: , Rfl: 0 .  ibuprofen (ADVIL,MOTRIN) 600 MG tablet, Take 600 mg by mouth every 6 (six) hours as needed. for pain, Disp: , Rfl: 0  Physical exam:  Vitals:   08/14/18 0950  BP: 128/85  Pulse: 76  Resp: 18  Temp: 98.5 F (36.9 C)  TempSrc: Tympanic  SpO2: 99%  Weight: 191 lb 12.8 oz (87 kg)  Height: 6\' 1"  (1.854 m)   Physical Exam  Constitutional: He is oriented to person, place, and time.  Thin gentleman in no acute distress  HENT:  Head: Normocephalic and atraumatic.  Eyes: Pupils are equal, round, and reactive to light. EOM are normal.  Neck: Normal range of motion.  Cardiovascular: Normal rate, regular rhythm and normal heart sounds.  Pulmonary/Chest: Effort normal  and breath sounds normal.  Abdominal: Soft. Bowel sounds are normal.  Neurological: He is alert and oriented to person, place, and time.  Skin: Skin is warm and dry.     CMP Latest Ref  Rng & Units 06/28/2018  Glucose 70 - 99 mg/dL 192(H)  BUN 6 - 20 mg/dL 18  Creatinine 0.61 - 1.24 mg/dL 0.94  Sodium 135 - 145 mmol/L 133(L)  Potassium 3.5 - 5.1 mmol/L 4.1  Chloride 98 - 111 mmol/L 102  CO2 22 - 32 mmol/L 20(L)  Calcium 8.9 - 10.3 mg/dL 8.1(L)  Total Protein 6.0 - 8.3 g/dL -  Total Bilirubin 0.2 - 1.2 mg/dL -  Alkaline Phos 39 - 117 U/L -  AST 0 - 37 U/L -  ALT 0 - 53 U/L -   CBC Latest Ref Rng & Units 08/14/2018  WBC 4.0 - 10.5 K/uL 4.4  Hemoglobin 13.0 - 17.0 g/dL 12.5(L)  Hematocrit 39.0 - 52.0 % 39.8  Platelets 150 - 400 K/uL 213      Assessment and plan- Patient is a 47 y.o. male with iron and B12 deficiency anemia likely secondary to gastric bypass  1.  Iron deficiency anemia: Today his hemoglobin is improved to 12.5.  Iron studies are currently pending.  I do not think that he would need IV iron at this time but we will call him if his iron levels come back low.  Repeat CBC with differential and iron studies in 4 and 8 months and I will see him back in 8 months  2.  B12 deficiency: Last B12 level checked in May 2019 was low at 233.  Patient is not taking any oral B12 and I asked him to take thousand micrograms of p.o. B12 daily.  I will repeat his levels in 3 months and if they are still low at that time I will consider giving him monthly B12 shots   Visit Diagnosis 1. Iron deficiency anemia, unspecified iron deficiency anemia type   2. B12 deficiency      Dr. Randa Evens, MD, MPH [facility] [phone] 08/14/2018 12:22 PM

## 2018-08-18 NOTE — Progress Notes (Signed)
Patient ID: Philip Ponce, male    DOB: 03-14-1971, 47 y.o.   MRN: [id number]  HPI  Philip Ponce is a 48 yo male with PMH significant for DM, iron deficiency, HTN, gastric bypass, STEMI and CHF with reduced ejection fraction.  Echo report from 01/06/18 reviewed and showed an EF of 30-35% along with trivial Philip. Echo report from 05/28/17 reviewed and showed an EF of 20-25%. Echo report done 12/05/16 was reviewed and showed an EF of 20-25% with mild Philip. Cardiac catheterization done 12/04/16 showed an EF of 30-35% and no significant CAD. Echo on 04/14/15 showed EF of 50-55%.  Was in the ED 06/28/18 due to loss of consciousness after drinking heavily. Monitored in the ED and eventually released.    Presents today for a follow-up visit with a chief complaint of minimal shortness of breath upon moderate exertion. He describes this as chronic in nature having been present for several years. He has associated fatigue and gradual weight gain along with this. He denies any cough, chest pain, pedal edema, palpitations, abdominal distention, dizziness or difficulty sleeping.   Past Medical History:  Diagnosis Date  . Acute blood loss anemia 11/20/2016  . 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 Chronic combined systolic (congestive) and diastolic (congestive) heart failure ([place])    a. 11/2016 Ehco: EF 20-25%, glob HK, antsept, ant, apical HK, Gr1 DD, mild Philip, mildly dil LA, nl RV fxn; b. 05/2017 Echo: Ef 20-25%, Gr2 DD, prominent apical trabeculations.  . Diabetes mellitus ([place])   . Family history of adverse reaction to anesthesia    mom had a hard time waking up  . GERD (gastroesophageal reflux disease)   . NICM (nonischemic cardiomyopathy) ([place])    a. 11/2016 Cath: nl cors, EF 25-30%; b. 11/2016 Echo: EF 20-25%; c. 05/2017 Echo: EF 20-25%.  . Slipped intervertebral disc    L4 L5  . STEMI (ST elevation myocardial infarction) ([place])    a. 11/2016 ST elevation -->Nl cors on cath.    Past Surgical History:  Procedure Laterality Date  . BIV ICD INSERTION CRT-D  06/26/2017  . BIV ICD INSERTION CRT-D N/A 06/26/2017   Procedure: BIV ICD INSERTION CRT-D;  Surgeon: [name], MD;  Location: [place] [facility];  Service: Cardiovascular;  Laterality: N/A;  . CERVICAL DISC ARTHROPLASTY N/A 07/24/2017   Procedure: CERVICAL ANTERIOR [place] ARTHROPLASTY C5-C7;  Surgeon: [name], MD;  Location: [facility] ORS;  Service: Neurosurgery;  Laterality: N/A;  . COLONOSCOPY WITH PROPOFOL N/A 12/03/2016   Procedure: COLONOSCOPY WITH PROPOFOL;  Surgeon: [name], MD;  Location: [facility] ENDOSCOPY;  Service: Endoscopy;  Laterality: N/A;  . ESOPHAGOGASTRODUODENOSCOPY (EGD) WITH PROPOFOL N/A 12/03/2016   Procedure: ESOPHAGOGASTRODUODENOSCOPY (EGD) WITH PROPOFOL;  Surgeon: [name], MD;  Location: [facility] ENDOSCOPY;  Service: Endoscopy;  Laterality: N/A;  . FINGER SURGERY Right 2012   index finger  . GASTRIC BYPASS    . GIVENS CAPSULE STUDY N/A 01/07/2017   Procedure: GIVENS CAPSULE STUDY;  Surgeon: [name], MD;  Location: [facility] ENDOSCOPY;  Service: Endoscopy;  Laterality: N/A;  . LEFT HEART CATH AND CORONARY ANGIOGRAPHY N/A 12/04/2016   Procedure: Left Heart Cath and Coronary Angiography;  Surgeon: [name], MD;  Location: [place] [facility];  Service: Cardiovascular;  Laterality: N/A;  . SHOULDER ARTHROSCOPY WITH OPEN ROTATOR CUFF REPAIR Left 10/04/2017   Procedure: SHOULDER ARTHROSCOPY WITH ROTATOR CUFF REPAIR, SUBACROMIAL DECOMPRESSION,OPEN BICEP TENODESIS, EXTENSIVE DEBRIDEMENT;  Surgeon: [name], MD;  Location: [facility] ORS;  Service: Orthopedics;  Laterality: Left;   Family History  Problem Relation Age of Onset  . Diabetes Mother   . Hypertension Mother   . Cancer Mother        breast  . Diabetes Father   . Hypertension Father   . Cancer Father        colon  . Cancer Maternal Grandfather        prostate  . Stroke Maternal Grandfather        CVA   Social  History   Tobacco Use  . Smoking status: Never Smoker  . Smokeless tobacco: Never Used  Substance Use Topics  . Alcohol use: Yes    Alcohol/week: 4.0 standard drinks    Types: 4 Glasses of wine per week    Frequency: Never   Allergies  Allergen Reactions  . Mobic [Meloxicam] Other (See Comments)    Ulcers in stomach eruption   . Tramadol     Hard to breath  . Diclofenac Palpitations and Other (See Comments)    Fast heart beat, CP,SOB and weakness on one side.   Prior to Admission medications   Medication Sig Start Date End Date Taking? Authorizing Provider  calcium carbonate (OS-CAL) 600 MG TABS tablet Take 1,200 mg by mouth daily with breakfast.    Yes [provider]  carvedilol (COREG) 12.5 MG tablet Take 12.5 mg by mouth 2 (two) times daily with a meal.   Yes [provider]  glucose blood (ONE TOUCH ULTRA TEST) test strip Check blood sugar once daily and as instructed. Dx E11.9 05/14/18  Yes Bedsole, Amy E, MD  ibuprofen (ADVIL,MOTRIN) 600 MG tablet Take 600 mg by mouth every 6 (six) hours as needed. for pain 06/26/18  Yes [provider]  Insulin Glargine (TOUJEO SOLOSTAR) 300 UNIT/ML SOPN Inject 15 Units into the skin daily. 01/13/18  Yes Bedsole, Amy E, MD  metFORMIN (GLUCOPHAGE) 500 MG tablet Take 500 mg by mouth daily with breakfast.    Yes [provider]  Multiple Vitamins-Minerals (ADULT GUMMY PO) Take 2 tablets by mouth daily.   Yes [provider]  Omega-3 Fatty Acids (FISH OIL PO) Take 800 mg by mouth daily.   Yes [provider]  omeprazole (PRILOSEC) 40 MG capsule Take 40 mg by mouth daily as needed (indigestion).    Yes [provider]  sacubitril-valsartan (ENTRESTO) 97-103 MG Take 1 tablet by mouth 2 (two) times daily. 10/09/17  Yes End, Harrell Gave, MD  spironolactone (ALDACTONE) 25 MG tablet Take 1 tablet (25 mg total) by mouth daily. 06/16/18 09/14/18 Yes End, Harrell Gave, MD     Review of Systems   Constitutional: Positive for fatigue. Negative for appetite change.  HENT: Negative for congestion, postnasal drip, sinus pressure and sore throat.   Eyes: Negative.   Respiratory: Positive for shortness of breath (improving). Negative for cough, chest tightness, wheezing and stridor.   Cardiovascular: Negative for chest pain, palpitations and leg swelling.  Gastrointestinal: Negative for abdominal distention and abdominal pain.  Endocrine: Negative.   Genitourinary: Negative.   Musculoskeletal: Negative for arthralgias, back pain, neck pain and neck stiffness.  Skin: Negative.   Allergic/Immunologic: Negative.   Neurological: Negative for dizziness and light-headedness.  Hematological: Negative for adenopathy. Does not bruise/bleed easily.  Psychiatric/Behavioral: Negative for dysphoric mood and sleep disturbance. The patient is not nervous/anxious.    Vitals:   08/19/18 0906  BP: (!) 131/91  Pulse: 76  Resp: 18  SpO2: 100%  Weight: 191 lb 8  oz (86.9 kg)  Height: 6\' 1"  (1.854 m)   Wt Readings from Last 3 Encounters:  08/19/18 191 lb 8 oz (86.9 kg)  08/14/18 191 lb 12.8 oz (87 kg)  07/03/18 188 lb 4 oz (85.4 kg)   Lab Results  Component Value Date   CREATININE 0.94 06/28/2018   CREATININE 0.90 06/18/2018   CREATININE 1.02 01/31/2018    Physical Exam  Constitutional: He is oriented to person, place, and time. He appears well-developed and well-nourished.  HENT:  Head: Normocephalic and atraumatic.  Neck: Normal range of motion. Neck supple. No JVD present.  Cardiovascular: Normal rate and regular rhythm.  Pulmonary/Chest: Effort normal. He has no wheezes. He has no rales.  Abdominal: Soft. He exhibits no distension. There is no tenderness.  Musculoskeletal: He exhibits no edema.       Left shoulder: He exhibits decreased range of motion. He exhibits no tenderness.  Neurological: He is alert and oriented to person, place, and time.  Skin: Skin is warm and dry.   Psychiatric: He has a normal mood and affect. His behavior is normal. Thought content normal.  Nursing note and vitals reviewed.   Assessment & Plan:  1. Congestive heart failure with reduced ejection fraction - NYHA class II - euvolemic - weighing daily. Reminded to call for weight gain of > 2 lbs overnight or > 5 lbs in one week - weight up 14 pounds since he was last here 6 months ago - not adding salt. Reminded to closely follow a 2000mg  sodium diet - going to the gym 3 times/ week (walking on the treadmill and weights) - saw cardiologist (End) 07/03/18 - saw EP Caryl Comes) 02/25/18 - on max entresto dose - patient reports receiving his flu vaccine for this season  2. Diabetes - A1c on 12/24/17 was 6.8% - Saw PCP on 06/26/18 (Bedsole)  3. HTN - BP mildly elevated today - BMP on 06/28/18 reviewed and showed potassium 4.1, sodium 133, creatinine 0.94 and GFR >60  Patient did not bring his medications nor a list. Each medication was verbally reviewed with the patient and he was encouraged to bring the bottles to every visit to confirm accuracy of list.  Will not make a return appointment at this time. Advised patient that he could call back at anytime to make another appointment.

## 2018-08-19 ENCOUNTER — Encounter: Payer: Self-pay | Admitting: Family

## 2018-08-19 ENCOUNTER — Ambulatory Visit: Payer: 59 | Attending: Family | Admitting: Family

## 2018-08-19 VITALS — BP 131/91 | HR 76 | Resp 18 | Ht 73.0 in | Wt 191.5 lb

## 2018-08-19 DIAGNOSIS — Z791 Long term (current) use of non-steroidal anti-inflammatories (NSAID): Secondary | ICD-10-CM | POA: Insufficient documentation

## 2018-08-19 DIAGNOSIS — Z794 Long term (current) use of insulin: Secondary | ICD-10-CM | POA: Insufficient documentation

## 2018-08-19 DIAGNOSIS — D509 Iron deficiency anemia, unspecified: Secondary | ICD-10-CM | POA: Insufficient documentation

## 2018-08-19 DIAGNOSIS — Z9884 Bariatric surgery status: Secondary | ICD-10-CM | POA: Diagnosis not present

## 2018-08-19 DIAGNOSIS — E119 Type 2 diabetes mellitus without complications: Secondary | ICD-10-CM | POA: Diagnosis not present

## 2018-08-19 DIAGNOSIS — K219 Gastro-esophageal reflux disease without esophagitis: Secondary | ICD-10-CM | POA: Insufficient documentation

## 2018-08-19 DIAGNOSIS — Z79899 Other long term (current) drug therapy: Secondary | ICD-10-CM | POA: Diagnosis not present

## 2018-08-19 DIAGNOSIS — Z95810 Presence of automatic (implantable) cardiac defibrillator: Secondary | ICD-10-CM | POA: Diagnosis not present

## 2018-08-19 DIAGNOSIS — Z8249 Family history of ischemic heart disease and other diseases of the circulatory system: Secondary | ICD-10-CM | POA: Diagnosis not present

## 2018-08-19 DIAGNOSIS — I10 Essential (primary) hypertension: Secondary | ICD-10-CM

## 2018-08-19 DIAGNOSIS — I252 Old myocardial infarction: Secondary | ICD-10-CM | POA: Diagnosis not present

## 2018-08-19 DIAGNOSIS — I110 Hypertensive heart disease with heart failure: Secondary | ICD-10-CM | POA: Diagnosis not present

## 2018-08-19 DIAGNOSIS — I5022 Chronic systolic (congestive) heart failure: Secondary | ICD-10-CM

## 2018-08-19 DIAGNOSIS — I5042 Chronic combined systolic (congestive) and diastolic (congestive) heart failure: Secondary | ICD-10-CM | POA: Insufficient documentation

## 2018-08-19 NOTE — Patient Instructions (Signed)
Continue weighing daily and call for an overnight weight gain of > 2 pounds or a weekly weight gain of >5 pounds. 

## 2018-09-01 ENCOUNTER — Other Ambulatory Visit: Payer: Self-pay | Admitting: *Deleted

## 2018-09-01 MED ORDER — METFORMIN HCL 500 MG PO TABS: 1000.0000 mg | ORAL_TABLET | Freq: Every day | ORAL | 1 refills | Status: DC

## 2018-09-08 ENCOUNTER — Telehealth: Payer: Self-pay | Admitting: Internal Medicine

## 2018-09-08 NOTE — Telephone Encounter (Signed)
Recieved request from : [employer]  Scanned to Releases  Forwarded to [employer] for processing

## 2018-09-25 ENCOUNTER — Telehealth: Payer: Self-pay

## 2018-09-25 NOTE — Telephone Encounter (Signed)
Patient notified per Merrilee Seashore with [employer] the patient needs to contact [employer] with his updated health insurance information. The patient has recently switched from [facility] to [place] with a new ID# [id number].

## 2018-09-30 ENCOUNTER — Ambulatory Visit (INDEPENDENT_AMBULATORY_CARE_PROVIDER_SITE_OTHER): Payer: 59

## 2018-09-30 DIAGNOSIS — I5022 Chronic systolic (congestive) heart failure: Secondary | ICD-10-CM | POA: Diagnosis not present

## 2018-09-30 DIAGNOSIS — I428 Other cardiomyopathies: Secondary | ICD-10-CM

## 2018-10-01 LAB — CUP PACEART REMOTE DEVICE CHECK
Battery Remaining Longevity: 96 mo
Battery Voltage: 2.99 V
Brady Statistic AP VP Percent: 0.03 %
Brady Statistic AP VS Percent: 0.01 %
Brady Statistic AS VP Percent: 98.72 %
Brady Statistic AS VS Percent: 1.24 %
Brady Statistic RA Percent Paced: 0.04 %
Brady Statistic RV Percent Paced: 51.5 %
Date Time Interrogation Session: 20200107083624
HighPow Impedance: 50 Ohm
Implantable Lead Implant Date: 20181003
Implantable Lead Implant Date: 20181003
Implantable Lead Location: 753858
Implantable Lead Location: 753859
Implantable Lead Location: 753860
Implantable Lead Model: 4398
Implantable Lead Model: 5076
Implantable Pulse Generator Implant Date: 20181003
Lead Channel Impedance Value: 159.265
Lead Channel Impedance Value: 166.25 Ohm
Lead Channel Impedance Value: 166.25 Ohm
Lead Channel Impedance Value: 189.525
Lead Channel Impedance Value: 199.5 Ohm
Lead Channel Impedance Value: 285 Ohm
Lead Channel Impedance Value: 285 Ohm
Lead Channel Impedance Value: 342 Ohm
Lead Channel Impedance Value: 361 Ohm
Lead Channel Impedance Value: 399 Ohm
Lead Channel Impedance Value: 399 Ohm
Lead Channel Impedance Value: 513 Ohm
Lead Channel Impedance Value: 551 Ohm
Lead Channel Impedance Value: 589 Ohm
Lead Channel Impedance Value: 608 Ohm
Lead Channel Impedance Value: 665 Ohm
Lead Channel Pacing Threshold Amplitude: 0.625 V
Lead Channel Pacing Threshold Amplitude: 0.875 V
Lead Channel Pacing Threshold Pulse Width: 0.4 ms
Lead Channel Pacing Threshold Pulse Width: 0.4 ms
Lead Channel Pacing Threshold Pulse Width: 0.4 ms
Lead Channel Sensing Intrinsic Amplitude: 2.375 mV
Lead Channel Sensing Intrinsic Amplitude: 2.375 mV
Lead Channel Sensing Intrinsic Amplitude: 8.125 mV
Lead Channel Sensing Intrinsic Amplitude: 8.125 mV
Lead Channel Setting Pacing Amplitude: 1.5 V
Lead Channel Setting Pacing Amplitude: 2 V
Lead Channel Setting Pacing Amplitude: 2.5 V
Lead Channel Setting Pacing Pulse Width: 0.4 ms
Lead Channel Setting Pacing Pulse Width: 0.4 ms
Lead Channel Setting Sensing Sensitivity: 0.3 mV
MDC IDC LEAD IMPLANT DT: 20181003
MDC IDC MSMT LEADCHNL LV IMPEDANCE VALUE: 703 Ohm
MDC IDC MSMT LEADCHNL RA IMPEDANCE VALUE: 418 Ohm
MDC IDC MSMT LEADCHNL RV PACING THRESHOLD AMPLITUDE: 0.75 V

## 2018-10-01 NOTE — Telephone Encounter (Addendum)
I spoke with Vickie Chartered loss adjuster) at [employer] on [address] regarding the patient's updated health insurance information and she stated, there is no prior authorization needed for the Entresto and the Delene Loll will cost the patient $75.00 every month.  Patient notified of this information as well.

## 2018-10-01 NOTE — Telephone Encounter (Addendum)
Received a notice that [employer] is unable to locate active eligibility for the patient.  The patient has contacted [place] on [address] with new insurance information.

## 2018-10-01 NOTE — Progress Notes (Signed)
Remote ICD transmission.   

## 2018-10-01 NOTE — Telephone Encounter (Addendum)
Sent a prior authorization through [employer] for Entresto 97/103 mg patient takes one tablet twice a day.  Dx: CHF NYHA Class II with an ICD code:  I50.2  Patient was told to discontinue Lisinopril and start on Entresto.

## 2018-10-08 ENCOUNTER — Telehealth: Payer: Self-pay | Admitting: Internal Medicine

## 2018-10-08 NOTE — Telephone Encounter (Signed)
Form pulled-   "Specific areas of concern:  - indicate whether or not procedures will be fine to perform according to patient's health history: Pacemaker"  Will review form with Dr. Caryl Comes tomorrow.

## 2018-10-08 NOTE — Telephone Encounter (Signed)
° °  [facility] Pre-operative Risk Assessment    Request for surgical clearance:  1. What type of surgery is being performed? Dental care consisting of multiple extractions, periodontal cleaning, and or fillings   2. When is this surgery scheduled? Not noted    3. What type of clearance is required (medical clearance vs. Pharmacy clearance to hold med vs. Both)? Both   4. Are there any medications that need to be held prior to surgery and how long? Not noted    5. Practice name and name of physician performing surgery? [facility]  Dr. Inda Merlin   6. What is your office phone number [phone]    7.   What is your office fax number [phone]   8.   Anesthesia type (None, local, MAC, general) ? Please advise    Clarisse Gouge 10/08/2018, 11:43 AM  _________________________________________________________________   (provider comments below)

## 2018-10-13 ENCOUNTER — Other Ambulatory Visit: Payer: Self-pay | Admitting: Family

## 2018-10-20 ENCOUNTER — Telehealth: Payer: Self-pay | Admitting: Internal Medicine

## 2018-10-20 NOTE — Telephone Encounter (Signed)
Attempted to call patient. LMTCB 1/27 

## 2018-10-20 NOTE — Telephone Encounter (Signed)
Pt c/o Shortness Of Breath: STAT if SOB developed within the last 24 hours or pt is noticeably SOB on the phone  1. Are you currently SOB (can you hear that pt is SOB on the phone)? no  2. How long have you been experiencing SOB?  A while progressing to noticeable   3. Are you SOB when sitting or when up moving around?  Interim at rest and exertion   4. Are you currently experiencing any other symptoms?  Went to the gym felt dizzy fatigue increasing

## 2018-10-20 NOTE — Telephone Encounter (Signed)
Pt calling to report SOB that has increasingly worsened over the past 6 months with a 6 pound weight gain in that time period as well.   He was at the gym this morning around 10 AM and walked on the treadmill for about 15 min. He felt SOB and "no energy". Pt also notes that he is having dyspnea at rest. He denies any recent swelling in legs or abdomen.   After the gym he stopped by [facility] for a quarter pounder and reports that he started having L anterior chest pains below pacing device. He reports that it "feels like heart burn" and "may be due to the onions" on his burger. Pt denies diaphoresis, arm pain or jaw pain.   Pt speaking in full sentences. Voice is even and no audible distress noted.  Pt reports that he is taking medication as prescribed including his spirolactone. In looking at medication list it appears that spironolactone is expired with no refills but patient reports he "has plenty of them".   Pt denies taking daily weights or vitals over the past week.   I explained to pt that I would route message to provider for further advice. In the meantime, I suggested that he maintain a heart healthy, low sodium diet as well as continue prescribed medications.   Pt verbalized understanding and agreeable to POC at this time. Pt has f/u with Christell Faith, PA on 2/17. I looked at possibly moving pt up but don't have any earlier dates at this time.   Message will be routed to Dr. Caryl Comes since Dr. Saunders Revel will not be in the office until 10/29/18.   Advised pt to call for any further questions or concerns.

## 2018-10-20 NOTE — Telephone Encounter (Signed)
Pt is returning you call .

## 2018-10-21 ENCOUNTER — Telehealth: Payer: Self-pay | Admitting: Cardiology

## 2018-10-21 NOTE — Telephone Encounter (Signed)
Spoke w/ pt and instructed him how to send a manual transmission w/ his home monitor. Transmission received.  

## 2018-10-21 NOTE — Telephone Encounter (Signed)
Per request from Dr. Caryl Comes, I called pt to ask him to transmit a report from device. Pt reported that he has never done this before and doesn't know how.   I asked EP device RN how I could best handle this scenario and she told me to route message to the device pool to request submission from patient.   Message routed. Pt made aware.   Pt also reported that chest discomfort has passed and he believed that it was r/t GI sx. SOB feels the same as it has been but no worse.  Pt speaks in full sentences without audible distress noted.    Advised pt to call for any further questions or concerns.

## 2018-10-21 NOTE — Telephone Encounter (Signed)
I spoke with the patient. He is aware that his form has been faxed to [place]. Faxed to [phone]- confirmation received.

## 2018-10-21 NOTE — Telephone Encounter (Signed)
Spoke w/ pt and instructed him how to send a manual transmission /w his home monitor.

## 2018-10-21 NOTE — Telephone Encounter (Signed)
Patient missed a call and thinks it was the device clinic to help with remote transmission

## 2018-10-21 NOTE — Telephone Encounter (Signed)
Patient came by to check on status Please advise when completed

## 2018-10-21 NOTE — Telephone Encounter (Signed)
Remote transmission reviewed, no treated VT episodes, no AF Effective Bi-VP 98.5%, lead measurements normal, optivol trend normal:

## 2018-10-21 NOTE — Telephone Encounter (Signed)
-----   Message from Emily Filbert, RN sent at 10/21/2018  2:59 PM EST ----- Can someone touch base with this patient in about an hour to help him transmit from home. He is currently not there and unsure of how to do this.   See 1/27 phone note.   Thanks!

## 2018-10-22 NOTE — Telephone Encounter (Signed)
Returned call to patient, transmission received and was routed to Dr. Caryl Comes by device RN.  Report is reassuring. Pt denies chest pain at this time, SOB has not worsened.   pt was scheduled for OV on 2/17. I offered to move him to first available next week on Thursday, Feb 6th at noon. Pt agreed.  Pt verbalized understanding and will come to appt next Thursday.   Advised pt to call for is sx worsen or chest pain returns. He agreed to call with any further questions or concerns.   Message routed to Dr. Caryl Comes as well as [name], NP, who will see him next week.   No further orders at this time.

## 2018-10-30 ENCOUNTER — Encounter: Payer: Self-pay | Admitting: Nurse Practitioner

## 2018-10-30 ENCOUNTER — Ambulatory Visit (INDEPENDENT_AMBULATORY_CARE_PROVIDER_SITE_OTHER): Payer: Managed Care, Other (non HMO) | Admitting: Nurse Practitioner

## 2018-10-30 VITALS — BP 130/88 | HR 77 | Ht 73.0 in | Wt 197.5 lb

## 2018-10-30 DIAGNOSIS — I5042 Chronic combined systolic (congestive) and diastolic (congestive) heart failure: Secondary | ICD-10-CM | POA: Diagnosis not present

## 2018-10-30 DIAGNOSIS — E119 Type 2 diabetes mellitus without complications: Secondary | ICD-10-CM

## 2018-10-30 DIAGNOSIS — I10 Essential (primary) hypertension: Secondary | ICD-10-CM

## 2018-10-30 DIAGNOSIS — I5022 Chronic systolic (congestive) heart failure: Secondary | ICD-10-CM | POA: Diagnosis not present

## 2018-10-30 DIAGNOSIS — I428 Other cardiomyopathies: Secondary | ICD-10-CM | POA: Diagnosis not present

## 2018-10-30 DIAGNOSIS — Z794 Long term (current) use of insulin: Secondary | ICD-10-CM

## 2018-10-30 NOTE — Progress Notes (Signed)
Office Visit    Patient Name: Philip Ponce Date of Encounter: 10/30/2018  Primary Care Provider:  [name], MD Primary Cardiologist:  [name], MD  Chief Complaint    48 year old male with a history of diabetes, nonischemic cardiomyopathy, hypertension, and chronic combined systolic and diastolic congestive heart failure, who presents for follow-up related to reduction in exercise tolerance.  Past Medical History    Past Medical History:  Diagnosis Date  . Acute blood loss anemia 11/20/2016  . AICD (automatic cardioverter/defibrillator) present    a. 06/2017 s/p MDT DTMA 1QQ [place] MRI Quad CRT-D SureScan (ser # [id number]).  . Chronic combined systolic (congestive) and diastolic (congestive) heart failure ([place])    a. 11/2016 Echo: EF 20-25%, glob HK, antsept, ant, apical HK, Gr1 DD, mild MR, mildly dil LA, nl RV fxn; b. 05/2017 Echo: Ef 20-25%, Gr2 DD, prominent apical trabeculations; c. 12/2017 Echo: EF 30-35%, Gr1 DD, glob HK.  . Diabetes mellitus ([place])   . Essential hypertension   . Family history of adverse reaction to anesthesia    mom had a hard time waking up  . GERD (gastroesophageal reflux disease)   . NICM (nonischemic cardiomyopathy) ([place])    a. 11/2016 Cath: nl cors, EF 25-30%; b. 11/2016 Echo: EF 20-25%; c. 05/2017 Echo: EF 20-25%; d. 12/2017 Echo: EF 30-35%, Gr1 DD.  Marland Kitchen Slipped intervertebral disc    L4 L5  . STEMI (ST elevation myocardial infarction) ([place])    a. 11/2016 ST elevation -->Nl cors on cath.   Past Surgical History:  Procedure Laterality Date  . BIV ICD INSERTION CRT-D  06/26/2017  . BIV ICD INSERTION CRT-D N/A 06/26/2017   Procedure: BIV ICD INSERTION CRT-D;  Surgeon: [name], MD;  Location: [place] [facility];  Service: Cardiovascular;  Laterality: N/A;  . CERVICAL DISC ARTHROPLASTY N/A 07/24/2017   Procedure: CERVICAL ANTERIOR [place] ARTHROPLASTY C5-C7;  Surgeon: [name], MD;  Location: [facility] OR[facility];  Service:  Neurosurgery;  Laterality: N/A;  . COLONOSCOPY WITH PROPOFOL N/A 12/03/2016   Procedure: COLONOSCOPY WITH PROPOFOL;  Surgeon: [name], MD;  Location: [facility] ENDOS[facility];  Service: Endoscopy;  Laterality: N/A;  . ESOPHAGOGASTRODUODENOSCOPY (EGD) WITH PROPOFOL N/A 12/03/2016   Procedure: ESOPHAGOGASTRODUODENOSCOPY (EGD) WITH PROPOFOL;  Surgeon: [name], MD;  Location: [facility] END[facility]COPY;  Service: Endoscopy;  Laterality: N/A;  . FINGER SURGERY Right 2012   index finger  . GASTRIC BYPASS    . GIVENS CAPSULE STUDY N/A 01/07/2017   Procedure: GIVENS CAPSULE STUDY;  Surgeon: [name], MD;  Location: [facility] ENDOSCOPY;  Service: Endoscopy;  Laterality: N/A;  . LEFT HEART CATH AND CORONARY ANGIOGRAPHY N/A 12/04/2016   Procedure: Left Heart Cath and Coronary Angiography;  Surgeon: [name], MD;  Location: [place] [facility];  Service: Cardiovascular;  Laterality: N/A;  . SHOULDER ARTHROSCOPY WITH OPEN ROTATOR CUFF REPAIR Left 10/04/2017   Procedure: SHOULDER ARTHROSCOPY WITH ROTATOR CUFF REPAIR, SUBACROMIAL DECOMPRESSION,OPEN BICEP TENODESIS, EXTENSIVE DEBRIDEMENT;  Surgeon: [name], MD;  Location: [facility] ORS;  Service: Orthopedics;  Laterality: Left;    Allergies  Allergies  Allergen Reactions  . Mobic [Meloxicam] Other (See Comments)    Ulcers in stomach eruption   . Tramadol     Hard to breath  . Diclofenac Palpitations and Other (See Comments)    Fast heart beat, CP,SOB and weakness on one side.    History of Present Illness    48 year old male with the above complex past medical history including hypertension, diabetes, nonischemic  cardiomyopathy, combined systolic and diastolic congestive heart failure, and GERD.  His cardiomyopathy history dates back to 2015, when he was found to have LV dysfunction and underwent catheterization at [facility], which showed no significant disease and an EF of 45%.  He had subsequent recovery of LV function but then in March 2008 he was admitted to  [facility] regional with chest pain, dyspnea, and left bundle branch block.  Catheterization showed normal coronary arteries while echo showed an EF of 20 to 25%.  He was subsequently medically managed with addition and titration of carvedilol, spironolactone, and Entresto.  Despite optimal medical therapy, EF remained poor in September 2018 with an EF of 20 to 25%.  He was then referred to EP and underwent biventricular ICD placement.  Following device placement, he did have some issues with intermittent chest wall stimulation from the LV lead which required multiple reprogramming efforts.    He was last seen in cardiology clinic in October, at which time he was doing reasonably well.  He notes however that over the past month or so, he has noted some reduction in exercise tolerance and just a sense of general fatigue.  He does not necessarily experience shortness of breath and says his weight has been stable at home, but notes that after walking for 50 yards or more, that he just feels more tired than usual.  He does admit to being more sedentary and has gained 6 pounds over the winter, which he does not attribute to fluid.  He has been walking some on a treadmill at the gym and within the past few weeks had an episode of significant lightheadedness that resolved relatively quickly but did require him to stop exercising that day.  This has not recurred.  He denies palpitations, chest pain, PND, orthopnea, dizziness, syncope, edema, change in abdominal girth, or early satiety.  Home Medications    Prior to Admission medications   Medication Sig Start Date End Date Taking? Authorizing Provider  calcium carbonate (OS-CAL) 600 MG TABS tablet Take 1,200 mg by mouth daily with breakfast.    Yes [provider]  carvedilol (COREG) 12.5 MG tablet TAKE 1 TABLET BY MOUTH TWICE DAILY 10/13/18  Yes Hackney, Tina A, FNP  glucose blood (ONE TOUCH ULTRA TEST) test strip Check blood sugar once daily and as  instructed. Dx E11.9 05/14/18  Yes Bedsole, Amy E, MD  ibuprofen (ADVIL,MOTRIN) 600 MG tablet Take 600 mg by mouth every 6 (six) hours as needed. for pain 06/26/18  Yes [provider]  Insulin Glargine (TOUJEO SOLOSTAR) 300 UNIT/ML SOPN Inject 15 Units into the skin daily. 01/13/18  Yes Bedsole, Amy E, MD  metFORMIN (GLUCOPHAGE) 500 MG tablet Take 2 tablets (1,000 mg total) by mouth daily with breakfast. Patient taking differently: Take 500 mg by mouth daily with breakfast.  09/01/18  Yes Bedsole, Amy E, MD  Multiple Vitamins-Minerals (ADULT GUMMY PO) Take 2 tablets by mouth daily.   Yes [provider]  Omega-3 Fatty Acids (FISH OIL PO) Take 800 mg by mouth daily.   Yes [provider]  omeprazole (PRILOSEC) 40 MG capsule Take 40 mg by mouth daily as needed (indigestion).    Yes [provider]  sacubitril-valsartan (ENTRESTO) 97-103 MG Take 1 tablet by mouth 2 (two) times daily. 10/09/17  Yes End, Harrell Gave, MD  spironolactone (ALDACTONE) 25 MG tablet Take 1 tablet (25 mg total) by mouth daily. 06/16/18 10/30/18 Yes End, Harrell Gave, MD    Review of Systems  As above, increased fatigability and slight reduction in exercise tolerance.  He denies chest pain, palpitations, PND, orthopnea, dizziness, syncope, edema, early satiety, or change in abdominal girth.  All other systems reviewed and are otherwise negative except as noted above.  Physical Exam    VS:  BP 130/88 (BP Location: Left Arm, Patient Position: Sitting, Cuff Size: Normal)   Pulse 77   Ht 6\' 1"  (1.854 m)   Wt 197 lb 8 oz (89.6 kg)   BMI 26.06 kg/m  , BMI Body mass index is 26.06 kg/m. GEN: Well nourished, well developed, in no acute distress. HEENT: normal. Neck: Supple, no JVD, carotid bruits, or masses. Cardiac: RRR, no murmurs, rubs, or gallops. No clubbing, cyanosis, edema.  Radials/PT 2+ and equal bilaterally.  Respiratory:  Respirations regular and unlabored, clear to auscultation  bilaterally. GI: Soft, nontender, nondistended, BS + x 4. MS: no deformity or atrophy. Skin: warm and dry, no rash. Neuro:  Strength and sensation are intact. Psych: Normal affect.  Accessory Clinical Findings    ECG personally reviewed by me today -a sensed, V paced, 77-no acute changes.  Assessment & Plan    1.  Chronic combined systolic and diastolic congestive heart failure/nonischemic cardiomyopathy: Status post catheterization in March 2018 revealing normal coronary arteries.  Status post biventricular ICD placement in October 2018.  Last echo in April 2019 showed an EF of 30 to 35% with grade 1 diastolic dysfunction.  Patient notes that over the past few weeks to a month, he has been noticing some reduction in exercise tolerance and easier fatigability.  He has not had any chest pain and denies any increase in abdominal girth or edema.  He is euvolemic on examination today.  His weight is up but he notes that he has been more sedentary and thinks he is simply gained weight.  I will follow-up lab work today including a CBC, basic metabolic panel, and BNP.  I am concerned that the symptoms may represent low output.  In that setting, I am referring him to our advanced heart failure clinic in [place].  We did discuss potentially adding BiDil to his regimen however with recent episode of lightheadedness while walking at the gym, I am concerned about potential intermittent hypotension.  Continue carvedilol, Entresto, and spironolactone.  If BNP returns elevated, I will add low-dose furosemide.  2.  Type 2 diabetes mellitus: This is well controlled on insulin therapy with an A1c of 5.6 in September 2018.  3. Essential hypertension: Stable today on beta-blocker, Entresto, and spironolactone.  4.  Disposition: Follow-up labs today.  We will plan to have him follow-up in advanced heart failure clinic within the next month or so and f/u w/ Dr. Saunders Revel in ~ 3 mos.  Murray Hodgkins, NP 10/30/2018,  4:51 PM

## 2018-10-30 NOTE — Patient Instructions (Signed)
Medication Instructions:  Your physician recommends that you continue on your current medications as directed. Please refer to the Current Medication list given to you today.  If you need a refill on your cardiac medications before your next appointment, please call your pharmacy.   Lab work: Your physician recommends that you return for lab work today (CBC, BMET, BNP) If you have labs (blood work) drawn today and your tests are completely normal, you will receive your results only by: Marland Kitchen MyChart Message (if you have MyChart) OR . A paper copy in the mail If you have any lab test that is abnormal or we need to change your treatment, we will call you to review the results.  Testing/Procedures: None ordered   Follow-Up: At [facility], you and your health needs are our priority.  As part of our continuing mission to provide you with exceptional heart care, we have created designated Provider Care Teams.  These Care Teams include your primary Cardiologist (physician) and Advanced Practice Providers (APPs -  Physician Assistants and Nurse Practitioners) who all work together to provide you with the care you need, when you need it. You will need a follow up appointment in 3 months.  You may see [name], MD or Murray Hodgkins, NP  Referral to Heart Failure Clinic in [place], [place], [place] [address] [phone]

## 2018-10-31 ENCOUNTER — Telehealth: Payer: Self-pay

## 2018-10-31 LAB — SPECIMEN STATUS REPORT

## 2018-10-31 NOTE — Telephone Encounter (Signed)
-----   Message from Theora Gianotti, NP sent at 10/31/2018  8:30 AM EST ----- Blood count stable, renal fxn.lytes wnl - awaiting bnp

## 2018-10-31 NOTE — Telephone Encounter (Signed)
Call to patient to review lab results. He verbalized understanding and had no further questions at this time.   Advised pt to call for any further questions or concerns.

## 2018-11-03 LAB — CBC
Hematocrit: 39.6 % (ref 37.5–51.0)
Hemoglobin: 12.2 g/dL — ABNORMAL LOW (ref 13.0–17.7)
MCH: 28 pg (ref 26.6–33.0)
MCHC: 30.8 g/dL — ABNORMAL LOW (ref 31.5–35.7)
MCV: 91 fL (ref 79–97)
Platelets: 209 10*3/uL (ref 150–450)
RBC: 4.36 x10E6/uL (ref 4.14–5.80)
RDW: 11.9 % (ref 11.6–15.4)
WBC: 4.9 10*3/uL (ref 3.4–10.8)

## 2018-11-03 LAB — BASIC METABOLIC PANEL
BUN/Creatinine Ratio: 17 (ref 9–20)
BUN: 17 mg/dL (ref 6–24)
CO2: 22 mmol/L (ref 20–29)
Calcium: 9 mg/dL (ref 8.7–10.2)
Chloride: 101 mmol/L (ref 96–106)
Creatinine, Ser: 0.99 mg/dL (ref 0.76–1.27)
GFR calc Af Amer: 103 mL/min/{1.73_m2} (ref >59)
GFR calc non Af Amer: 89 mL/min/{1.73_m2} (ref >59)
Glucose: 157 mg/dL — ABNORMAL HIGH (ref 65–99)
Potassium: 4.8 mmol/L (ref 3.5–5.2)
Sodium: 142 mmol/L (ref 134–144)

## 2018-11-03 LAB — BRAIN NATRIURETIC PEPTIDE: BNP: 97.2 pg/mL (ref 0.0–100.0)

## 2018-11-04 ENCOUNTER — Other Ambulatory Visit: Payer: Self-pay | Admitting: Internal Medicine

## 2018-11-04 ENCOUNTER — Telehealth: Payer: Self-pay | Admitting: *Deleted

## 2018-11-04 DIAGNOSIS — Z79899 Other long term (current) drug therapy: Secondary | ICD-10-CM

## 2018-11-04 DIAGNOSIS — I10 Essential (primary) hypertension: Secondary | ICD-10-CM

## 2018-11-04 MED ORDER — DIGOXIN 125 MCG PO TABS: 0.1250 mg | ORAL_TABLET | Freq: Every day | ORAL | 3 refills | Status: DC

## 2018-11-04 NOTE — Telephone Encounter (Signed)
-----   Message from Theora Gianotti, NP sent at 11/04/2018  7:27 AM EST ----- bnp wnl. Please add digoxin 0.125 mg PO daily and f/u digoxin level in a week.  He is not going to be seen in CHF clinic until 3/11.  I'd like to see him prior to that - in 2 wks if possible.

## 2018-11-04 NOTE — Telephone Encounter (Signed)
Results called to pt. Pt verbalized understanding of results and plan of care to start digoxin and [facility]. Digoxin order entered. Rx sent to pharmacy. Appointment scheduled to see [name], Np on 11/20/18.

## 2018-11-06 ENCOUNTER — Telehealth: Payer: Self-pay | Admitting: Internal Medicine

## 2018-11-06 NOTE — Telephone Encounter (Signed)
Received forms from [employer] to be reviewed and completed Forwarded to [facility] through [employer]

## 2018-11-10 ENCOUNTER — Ambulatory Visit: Payer: 59 | Admitting: Physician Assistant

## 2018-11-12 ENCOUNTER — Other Ambulatory Visit: Payer: Self-pay

## 2018-11-12 ENCOUNTER — Other Ambulatory Visit
Admission: RE | Admit: 2018-11-12 | Discharge: 2018-11-12 | Disposition: A | Discharge status: Home / Self Care | Payer: Managed Care, Other (non HMO) | Attending: Nurse Practitioner | Admitting: Nurse Practitioner

## 2018-11-12 DIAGNOSIS — I10 Essential (primary) hypertension: Secondary | ICD-10-CM | POA: Diagnosis not present

## 2018-11-12 DIAGNOSIS — Z79899 Other long term (current) drug therapy: Secondary | ICD-10-CM | POA: Insufficient documentation

## 2018-11-12 LAB — DIGOXIN LEVEL: DIGOXIN LVL: 0.5 ng/mL — AB (ref 0.8–2.0)

## 2018-11-12 MED ORDER — SACUBITRIL-VALSARTAN 97-103 MG PO TABS: 1.0000 | ORAL_TABLET | Freq: Two times a day (BID) | ORAL | 3 refills | Status: DC

## 2018-11-17 NOTE — Telephone Encounter (Signed)
Noted. thx 

## 2018-11-20 ENCOUNTER — Ambulatory Visit (INDEPENDENT_AMBULATORY_CARE_PROVIDER_SITE_OTHER): Payer: Managed Care, Other (non HMO) | Admitting: Nurse Practitioner

## 2018-11-20 ENCOUNTER — Encounter: Payer: Self-pay | Admitting: Nurse Practitioner

## 2018-11-20 VITALS — BP 128/68 | HR 97 | Ht 73.0 in | Wt 199.0 lb

## 2018-11-20 DIAGNOSIS — I10 Essential (primary) hypertension: Secondary | ICD-10-CM

## 2018-11-20 DIAGNOSIS — I5042 Chronic combined systolic (congestive) and diastolic (congestive) heart failure: Secondary | ICD-10-CM

## 2018-11-20 DIAGNOSIS — I428 Other cardiomyopathies: Secondary | ICD-10-CM | POA: Diagnosis not present

## 2018-11-20 NOTE — Progress Notes (Signed)
Office Visit    Patient Name: [name] Date of Encounter: 11/20/2018  Primary Care Provider:  [name], MD Primary Cardiologist:  Philip Bush, MD  Chief Complaint    48 year old male with Ponce history of diabetes, nonischemic cardiomyopathy, hypertension, and chronic combined systolic diastolic congestive heart failure, who presents for follow-up related to reduction in exercise tolerance.  Past Medical History    Past Medical History:  Diagnosis Date  . Acute blood loss anemia 11/20/2016  . AICD (automatic cardioverter/defibrillator) present    Ponce. 06/2017 s/p MDT DTMA 1QQ [place] MRI Quad CRT-D SureScan (ser # [id number]).  . Chronic combined systolic (congestive) and diastolic (congestive) heart failure ([place])    Ponce. 11/2016 Echo: EF 20-25%, glob HK, antsept, ant, apical HK, Gr1 DD, mild MR, mildly dil LA, nl RV fxn; b. 05/2017 Echo: Ef 20-25%, Gr2 DD, prominent apical trabeculations; c. 12/2017 Echo: EF 30-35%, Gr1 DD, glob HK.  . Diabetes mellitus ([place])   . Essential hypertension   . Family history of adverse reaction to anesthesia    mom had Ponce hard time waking up  . GERD (gastroesophageal reflux disease)   . NICM (nonischemic cardiomyopathy) ([place])    Ponce. 11/2016 Cath: nl cors, EF 25-30%; b. 11/2016 Echo: EF 20-25%; c. 05/2017 Echo: EF 20-25%; d. 12/2017 Echo: EF 30-35%, Gr1 DD.  Marland Kitchen Slipped intervertebral disc    L4 L5  . STEMI (ST elevation myocardial infarction) ([place])    Ponce. 11/2016 ST elevation -->Nl cors on cath.   Past Surgical History:  Procedure Laterality Date  . BIV ICD INSERTION CRT-D  06/26/2017  . BIV ICD INSERTION CRT-D N/Ponce 06/26/2017   Procedure: BIV ICD INSERTION CRT-D;  Surgeon: Philip Haw, MD;  Location: [facility];  Service: Cardiovascular;  Laterality: N/Ponce;  . CERVICAL DISC ARTHROPLASTY N/Ponce 07/24/2017   Procedure: CERVICAL ANTERIOR [place] ARTHROPLASTY C5-C7;  Surgeon: Philip Maw, MD;  Location: [facility];  Service:  Neurosurgery;  Laterality: N/Ponce;  . COLONOSCOPY WITH PROPOFOL N/Ponce 12/03/2016   Procedure: COLONOSCOPY WITH PROPOFOL;  Surgeon: Philip Bellows, MD;  Location: [facility];  Service: Endoscopy;  Laterality: N/Ponce;  . ESOPHAGOGASTRODUODENOSCOPY (EGD) WITH PROPOFOL N/Ponce 12/03/2016   Procedure: ESOPHAGOGASTRODUODENOSCOPY (EGD) WITH PROPOFOL;  Surgeon: Philip Bellows, MD;  Location: [facility];  Service: Endoscopy;  Laterality: N/Ponce;  . FINGER SURGERY Right 2012   index finger  . GASTRIC BYPASS    . GIVENS CAPSULE STUDY N/Ponce 01/07/2017   Procedure: GIVENS CAPSULE STUDY;  Surgeon: Philip Bellows, MD;  Location: [facility] ENDOSCOPY;  Service: Endoscopy;  Laterality: N/Ponce;  . LEFT HEART CATH AND CORONARY ANGIOGRAPHY N/Ponce 12/04/2016   Procedure: Left Heart Cath and Coronary Angiography;  Surgeon: Philip Bush, MD;  Location: [facility];  Service: Cardiovascular;  Laterality: N/Ponce;  . SHOULDER ARTHROSCOPY WITH OPEN ROTATOR CUFF REPAIR Left 10/04/2017   Procedure: SHOULDER ARTHROSCOPY WITH ROTATOR CUFF REPAIR, SUBACROMIAL DECOMPRESSION,OPEN BICEP TENODESIS, EXTENSIVE DEBRIDEMENT;  Surgeon: Philip Fabry, MD;  Location: [facility];  Service: Orthopedics;  Laterality: Left;    Allergies  Allergies  Allergen Reactions  . Mobic [Meloxicam] Other (See Comments)    Ulcers in stomach eruption   . Tramadol     Hard to breath  . Diclofenac Palpitations and Other (See Comments)    Fast heart beat, CP,SOB and weakness on one side.    History of Present Illness    48 year old male with above complex past medical history including hypertension, diabetes, nonischemic cardiomyopathy, chronic  combined systolic diastolic congestive heart failure, and GERD.  Cardiomyopathy history dates back to 2015, when he was found to have LV dysfunction underwent catheterization at [facility], which showed no significant disease and an EF of 45%.  He had subsequent recovery of LV function but then in March 2008, he was admitted to [facility]  regional with chest pain, dyspnea, and left bundle branch block.  Catheterization showed normal coronary arteries, while echo showed an EF of 20 to 25%.  He was subsequently medically managed with addition and titration of carvedilol, spironolactone, and Entresto.  Despite optimal medical therapy, EF remained poor in September 2018 with an EF of 2025%.  He was then referred to EP and underwent biventricular ICD placement.  Following device placement, he did have some issues with intermittent chest wall stimulation from the LV lead, which required multiple reprogramming efforts.  He was last seen in clinic on October 30, 2018, at which time he reported Ponce general sense of fatigue with reduction in exercise tolerance and easy fatigability.  His weight was up 6 pounds but he attributed that to being more sedentary.  Lab work that day was unremarkable with normal renal function, electrolytes, and BNP.  Hemoglobin was mildly low at 12.2 which was stable.  Oral digoxin therapy was added in follow-up serum digoxin level was 0.5.  Since his last visit, he has done much better.  He says that within 2 days of starting digoxin, it felt like he had just eaten [place]once [place] [employer] and was hit by Ponce breath of fresh air.  He has had significant improvement in exercise tolerance and reduction in fatigue and dyspnea.  He denies chest pain, palpitations, PND, orthopnea, dizziness, syncope, edema, or early satiety.  He still occasionally notes chest wall spasms related to his LV lead.  Home Medications    Prior to Admission medications   Medication Sig Start Date End Date Taking? Authorizing Provider  calcium carbonate (OS-CAL) 600 MG TABS tablet Take 1,200 mg by mouth daily with breakfast.     [provider]  carvedilol (COREG) 12.5 MG tablet TAKE 1 TABLET BY MOUTH TWICE DAILY 10/13/18   Philip Price A, FNP  digoxin (LANOXIN) 0.125 MG tablet Take 1 tablet (0.125 mg total) by mouth daily. 11/04/18   Philip Gianotti, NP  glucose blood (ONE TOUCH ULTRA TEST) test strip Check blood sugar once daily and as instructed. Dx E11.9 05/14/18   Philip Sanders, MD  ibuprofen (ADVIL,MOTRIN) 600 MG tablet Take 600 mg by mouth every 6 (six) hours as needed. for pain 06/26/18   [provider]  Insulin Glargine (TOUJEO SOLOSTAR) 300 UNIT/ML SOPN Inject 15 Units into the skin daily. 01/13/18   Bedsole, Amy E, MD  metFORMIN (GLUCOPHAGE) 500 MG tablet Take 2 tablets (1,000 mg total) by mouth daily with breakfast. Patient taking differently: Take 500 mg by mouth daily with breakfast.  09/01/18   Bedsole, Amy E, MD  Multiple Vitamins-Minerals (ADULT GUMMY PO) Take 2 tablets by mouth daily.    [provider]  Omega-3 Fatty Acids (FISH OIL PO) Take 800 mg by mouth daily.    [provider]  omeprazole (PRILOSEC) 40 MG capsule Take 40 mg by mouth daily as needed (indigestion).     [provider]  sacubitril-valsartan (ENTRESTO) 97-103 MG Take 1 tablet by mouth 2 (two) times daily. 11/12/18   Philip Gianotti, NP  spironolactone (ALDACTONE) 25 MG tablet Take 1 tablet (25 mg total) by  mouth daily. 06/16/18 10/30/18  End, Harrell Gave, MD    Review of Systems    Doing much better since his last visit with improved exercise tolerance and reduce fatigue.  He denies chest pain, dyspnea, palpitations, PND, orthopnea, dizziness, syncope, edema, or early satiety.  He occasionally notes left chest wall spasms related to his LV lead.  All other systems reviewed and are otherwise negative except as noted above.  Physical Exam    VS:  BP 128/68 (BP Location: Left Arm, Patient Position: Sitting, Cuff Size: Normal)   Pulse 97   Ht 6\' 1"  (1.854 m)   Wt 199 lb (90.3 kg)   BMI 26.25 kg/m  , BMI Body mass index is 26.25 kg/m. GEN: Well nourished, well developed, in no acute distress. HEENT: normal. Neck: Supple, no JVD, carotid bruits, or masses. Cardiac: RRR, no murmurs, rubs, or  gallops. No clubbing, cyanosis, edema.  Radials/DP/PT 2+ and equal bilaterally.  Respiratory:  Respirations regular and unlabored, clear to auscultation bilaterally. GI: Soft, nontender, nondistended, BS + x 4. MS: no deformity or atrophy. Skin: warm and dry, no rash. Neuro:  Strength and sensation are intact. Psych: Normal affect.  Accessory Clinical Findings    ECG personally reviewed by me today -Ponce sensed, V paced, 97- no acute changes.  Lab Results  Component Value Date   WBC 4.9 10/30/2018   HGB 12.2 (L) 10/30/2018   HCT 39.6 10/30/2018   MCV 91 10/30/2018   PLT 209 10/30/2018   Lab Results  Component Value Date   CREATININE 0.99 10/30/2018   BUN 17 10/30/2018   NA 142 10/30/2018   K 4.8 10/30/2018   CL 101 10/30/2018   CO2 22 10/30/2018   BNP 97.2 (10/30/18)  Assessment & Plan    1.  Chronic combined systolic and diastolic CHF/NICM:  S/p cath in 11/2016 revealing nl cors.  Status post biventricular ICD in October 2018.  Echocardiogram April 2019 showed an EF of 3035% with grade 1 diastolic dysfunction.  At his last visit on April 6, he noted reduction in exercise tolerance and easy fatigability.  Lab work was relatively unremarkable at that time and BNP was normal.  Out of concern for potential low output heart failure, I added digoxin 0.125 mg daily.  Follow-up digoxin level was unremarkable.  He notes significant improvement in exercise tolerance and reduction of fatigability since being placed on digoxin.  He remains euvolemic on exam today.  His weight is up 2 pounds but he says he has been eating more at home.  He otherwise remains on beta-blocker, Entresto, and Spironolactone therapy.  He previously did not tolerate higher doses of carvedilol secondary to fatigue.  Could consider addition of BiDil though at his last visit, he reported intermittent dizziness.  He has not any recurrence of this.  We will hold off on adding BiDil at this time as he will follow-up in heart  failure clinic in [place] in about 2 weeks and if he is symptomatically stable at that time with adequate blood pressure, it could be reconsidered.  2.  Essential hypertension: Stable on beta-blocker, Entresto, and Spironolactone.  3.  Type 2 diabetes mellitus: A1c was 5.6 in September 2019.  He is on insulin therapy.  4.  Status post ICD: Ever since this was placed, he has noted intermittent left chest wall spasms.  He says this has persisted despite adjustments to device settings.  His last remote monitoring was in January and was stable.  He follows with  Dr. Caryl Comes.  5.  Disposition: Patient will follow-up in heart failure clinic March 11.  Follow-up with Dr. Saunders Revel scheduled in May or sooner if necessary.  Murray Hodgkins, NP 11/20/2018, 9:02 AM

## 2018-11-20 NOTE — Patient Instructions (Signed)
Medication Instructions:  Your physician recommends that you continue on your current medications as directed. Please refer to the Current Medication list given to you today.  If you need a refill on your cardiac medications before your next appointment, please call your pharmacy.   Lab work: none If you have labs (blood work) drawn today and your tests are completely normal, you will receive your results only by: Marland Kitchen MyChart Message (if you have MyChart) OR . A paper copy in the mail If you have any lab test that is abnormal or we need to change your treatment, we will call you to review the results.  Testing/Procedures: none  Follow-Up: At [facility], you and your health needs are our priority.  As part of our continuing mission to provide you with exceptional heart care, we have created designated Provider Care Teams.  These Care Teams include your primary Cardiologist (physician) and Advanced Practice Providers (APPs -  Physician Assistants and Nurse Practitioners) who all work together to provide you with the care you need, when you need it. You will need a follow up appointment as scheduled.

## 2018-12-01 ENCOUNTER — Telehealth: Payer: Self-pay | Admitting: *Deleted

## 2018-12-01 NOTE — Telephone Encounter (Signed)
Received fax from [facility] stating [place] requires PA.  Insurance covers Engineer, agricultural, Contractor or [place].  Change insulin or start PA?  Please advise.

## 2018-12-02 MED ORDER — BASAGLAR KWIKPEN 100 UNIT/ML ~~LOC~~ SOPN: 15.0000 [IU] | PEN_INJECTOR | Freq: Every day | SUBCUTANEOUS | 11 refills | Status: DC

## 2018-12-02 NOTE — Telephone Encounter (Signed)
Sent in basaglar

## 2018-12-02 NOTE — Telephone Encounter (Signed)
Call Has he tried any of these in past... if not agreeable to change?

## 2018-12-02 NOTE — Telephone Encounter (Signed)
Spoke with Philip Ponce.  He has not tried any of the other insulins on formulary but he is willing to change as long as they are similar and gives him the same results.

## 2018-12-03 ENCOUNTER — Ambulatory Visit (HOSPITAL_COMMUNITY)
Admission: RE | Admit: 2018-12-03 | Discharge: 2018-12-03 | Disposition: A | Discharge status: Home / Self Care | Payer: Managed Care, Other (non HMO) | Source: Ambulatory Visit | Attending: Cardiology | Admitting: Cardiology

## 2018-12-03 ENCOUNTER — Other Ambulatory Visit: Payer: Self-pay

## 2018-12-03 ENCOUNTER — Encounter (HOSPITAL_COMMUNITY): Payer: Self-pay | Admitting: Cardiology

## 2018-12-03 VITALS — BP 140/86 | HR 71 | Wt 196.4 lb

## 2018-12-03 DIAGNOSIS — E119 Type 2 diabetes mellitus without complications: Secondary | ICD-10-CM | POA: Diagnosis not present

## 2018-12-03 DIAGNOSIS — I447 Left bundle-branch block, unspecified: Secondary | ICD-10-CM | POA: Diagnosis not present

## 2018-12-03 DIAGNOSIS — Z79899 Other long term (current) drug therapy: Secondary | ICD-10-CM | POA: Diagnosis not present

## 2018-12-03 DIAGNOSIS — I428 Other cardiomyopathies: Secondary | ICD-10-CM | POA: Diagnosis not present

## 2018-12-03 DIAGNOSIS — I110 Hypertensive heart disease with heart failure: Secondary | ICD-10-CM | POA: Diagnosis present

## 2018-12-03 DIAGNOSIS — I5022 Chronic systolic (congestive) heart failure: Secondary | ICD-10-CM | POA: Insufficient documentation

## 2018-12-03 DIAGNOSIS — Z8249 Family history of ischemic heart disease and other diseases of the circulatory system: Secondary | ICD-10-CM | POA: Diagnosis not present

## 2018-12-03 DIAGNOSIS — Z794 Long term (current) use of insulin: Secondary | ICD-10-CM | POA: Insufficient documentation

## 2018-12-03 LAB — BASIC METABOLIC PANEL
Anion gap: 5 (ref 5–15)
BUN: 12 mg/dL (ref 6–20)
CO2: 27 mmol/L (ref 22–32)
Calcium: 9.1 mg/dL (ref 8.9–10.3)
Chloride: 105 mmol/L (ref 98–111)
Creatinine, Ser: 0.91 mg/dL (ref 0.61–1.24)
GFR calc Af Amer: >60 mL/min (ref >60)
GFR calc non Af Amer: >60 mL/min (ref >60)
Glucose, Bld: 221 mg/dL — ABNORMAL HIGH (ref 70–99)
Potassium: 4.8 mmol/L (ref 3.5–5.1)
Sodium: 137 mmol/L (ref 135–145)

## 2018-12-03 LAB — DIGOXIN LEVEL: DIGOXIN LVL: 0.3 ng/mL — AB (ref 0.8–2.0)

## 2018-12-03 MED ORDER — ISOSORB DINITRATE-HYDRALAZINE 20-37.5 MG PO TABS: 0.5000 | ORAL_TABLET | Freq: Three times a day (TID) | ORAL | 3 refills | Status: DC

## 2018-12-03 NOTE — Progress Notes (Signed)
ReDS Vest - 12/03/18 1200      ReDS Vest   MR   No  (Pended)     Estimated volume prior to reading  High  (Pended)     Fitting Posture  Sitting  (Pended)     Height Marker  Tall  (Pended)     Ruler Value  34  (Pended)     Center Strip  Aligned  (Pended)     ReDS Value  33  (Pended)

## 2018-12-03 NOTE — Patient Instructions (Addendum)
Labs were done today. We will call you with any ABNORMAL results. No news is good news!  BEGIN taking BIDIL (0.5 tab) THREE TIMES A DAY. This has been sent to your pharmacy.  You have been referred to complete at Cardiac Exercise Test, this has been scheduled with you.  You have been referred to complete a Cardiac MRI, this office will call to schedule an appointment with you.  Please follow up with Pharmacy for medication titration, this has been scheduled with you.  Your physician recommends that you schedule a follow-up appointment in: 6 weeks.

## 2018-12-04 NOTE — Progress Notes (Signed)
PCP: Dr [name] Cardiology: Dr. Saunders Revel HF Cardiology: Dr. [name]  48 y.o. with history of chronic systolic CHF and diabetes was referred by Dr. Saunders Revel for evaluation of CHF. He has had a cardiomyopathy known since around 2015.  No coronary disease by cath, last was in 3/18.  He has a Medtronic CRT-D device. Last echo in 4/19 with EF 30-35.    He tries to stay active. He was able to walk 4 laps around a track yesterday without pausing.  Going to gym.  No dyspnea walking up stairs or a hill.  Occasional symptoms consistent with orthopnea.  No lightheadedness or palpitations.   Medtronic device interrogation: No AF or VT, stable thoracic impedance.   REDS clip: 33%  Labs (2/20): K 4.8, creatinine 0.99, hgb 12.2  PMH:  1. Type 2 diabetes. 2. HTN 3. Chronic systolic CHF: Nonischemic cardiomyopathy.  Medtronic CRT-D device.  - LHC (2015): EF 45%, no significant coronary disease.  - Echo (3/18): EF 20-25%.  - LHC (3/18): normal coronaries.  - Echo (9/18): EF 20-25%, prominent apical trabeculations.  - Echo (4/19): EF 30-35%.   FH: Mother with CHF in her 23s. Other members family with "heart disease."   SH: Married, lives in [place], on disability, 2 kids.  No tobacco, rare ETOH.   ROS: All systems reviewed and negative except as per HPI.   Current Outpatient Medications  Medication Sig Dispense Refill  . calcium carbonate (OS-CAL) 600 MG TABS tablet Take 1,200 mg by mouth daily with breakfast.     . carvedilol (COREG) 12.5 MG tablet TAKE 1 TABLET BY MOUTH TWICE DAILY 180 tablet 3  . digoxin (LANOXIN) 0.125 MG tablet Take 1 tablet (0.125 mg total) by mouth daily. 90 tablet 3  . glucose blood (ONE TOUCH ULTRA TEST) test strip Check blood sugar once daily and as instructed. Dx E11.9 100 each 1  . ibuprofen (ADVIL,MOTRIN) 600 MG tablet Take 600 mg by mouth every 6 (six) hours as needed. for pain  0  . Insulin Glargine (BASAGLAR KWIKPEN) 100 UNIT/ML SOPN Inject 0.15 mLs (15 Units total) into  the skin daily. 3 mL 11  . metFORMIN (GLUCOPHAGE) 500 MG tablet Take 2 tablets (1,000 mg total) by mouth daily with breakfast. (Patient taking differently: Take 500 mg by mouth daily with breakfast. ) 180 tablet 1  . Multiple Vitamins-Minerals (ADULT GUMMY PO) Take 2 tablets by mouth daily.    . Omega-3 Fatty Acids (FISH OIL PO) Take 800 mg by mouth daily.    Marland Kitchen omeprazole (PRILOSEC) 40 MG capsule Take 40 mg by mouth daily as needed (indigestion).     . sacubitril-valsartan (ENTRESTO) 97-103 MG Take 1 tablet by mouth 2 (two) times daily. 180 tablet 3  . spironolactone (ALDACTONE) 25 MG tablet Take 1 tablet (25 mg total) by mouth daily. 90 tablet 0  . isosorbide-hydrALAZINE (BIDIL) 20-37.5 MG tablet Take 0.5 tablets by mouth 3 (three) times daily. 15 tablet 3   No current facility-administered medications for this encounter.    BP 140/86   Pulse 71   Wt 89.1 kg (196 lb 6.4 oz)   SpO2 99%   BMI 25.91 kg/m  General: NAD Neck: JVP 8 cm, no thyromegaly or thyroid nodule.  Lungs: Decreased BS at bases.  CV: Nondisplaced PMI.  Heart regular S1/S2, no S3/S4, no murmur.  No peripheral edema.  No carotid bruit.  Normal pedal pulses.  Abdomen: Soft, nontender, no hepatosplenomegaly, no distention.  Skin: Intact without lesions or rashes.  Neurologic: Alert and oriented x 3.  Psych: Normal affect. Extremities: No clubbing or cyanosis.  HEENT: Normal.   Assessment/Plan: 1. Chronic systolic CHF: Nonischemic cardiomyopathy (cath in 4/19 with no significant coronary disease).  Medtronic CRT-D device.  Cause uncertain, familial is not out of question but mother was older (in 9s) when she developed CHF. One prior echo report was concerned for possible noncompaction.  NYHA class II-III symptoms. He is not volume overloaded on exam or by Optivol.  - Continue Coreg 12.5 mg bid (fatigue with attempted up-titration.  - Continue Entresto 97/103 and spironolactone 25 daily.  - Continue digoxin 0.125, digoxin  level today.  - Add Bidil 1/2 tab tid.  - Volume status ok, I do not think that he needs a diuretic.  - I will arrange for cardiac MRI (will check to see if ICD is MRI compatible).  Will look for infiltrative disease, evidence for ventricular noncompaction.  - CPX will be arranged for prognostic purposes. .  - Check BMET.  2. Type 2 diabetes: SGLT2-inhibitor would be a good next step in treatment given effect on the heart.   Loralie Champagne 12/04/2018

## 2018-12-12 ENCOUNTER — Inpatient Hospital Stay: Payer: Managed Care, Other (non HMO)

## 2018-12-17 ENCOUNTER — Encounter (HOSPITAL_COMMUNITY): Payer: Managed Care, Other (non HMO)

## 2018-12-19 ENCOUNTER — Telehealth: Payer: Self-pay | Admitting: Family Medicine

## 2018-12-19 DIAGNOSIS — D509 Iron deficiency anemia, unspecified: Secondary | ICD-10-CM

## 2018-12-19 DIAGNOSIS — E119 Type 2 diabetes mellitus without complications: Secondary | ICD-10-CM

## 2018-12-19 DIAGNOSIS — Z9884 Bariatric surgery status: Secondary | ICD-10-CM

## 2018-12-19 NOTE — Telephone Encounter (Signed)
-----   Message from Ellamae Sia sent at 12/19/2018  3:55 PM EDT ----- Regarding: Lab orders for Wednesday, 4.1.20 Lab orders, for dm f/u

## 2018-12-23 NOTE — Telephone Encounter (Signed)
Received forms from [facility] for physician to sign, placed in MA box.

## 2018-12-23 NOTE — Telephone Encounter (Signed)
[employer] forms received from nurse bin- placed on Dr. Darnelle Bos desk (in box) to review.

## 2018-12-24 ENCOUNTER — Other Ambulatory Visit: Payer: Self-pay

## 2018-12-24 ENCOUNTER — Other Ambulatory Visit (INDEPENDENT_AMBULATORY_CARE_PROVIDER_SITE_OTHER): Payer: Managed Care, Other (non HMO)

## 2018-12-24 ENCOUNTER — Telehealth: Payer: Self-pay | Admitting: *Deleted

## 2018-12-24 DIAGNOSIS — D509 Iron deficiency anemia, unspecified: Secondary | ICD-10-CM

## 2018-12-24 DIAGNOSIS — Z9884 Bariatric surgery status: Secondary | ICD-10-CM | POA: Diagnosis not present

## 2018-12-24 DIAGNOSIS — E119 Type 2 diabetes mellitus without complications: Secondary | ICD-10-CM

## 2018-12-24 LAB — CBC WITH DIFFERENTIAL/PLATELET
Basophils Absolute: 0 10*3/uL (ref 0.0–0.1)
Basophils Relative: 0.9 % (ref 0.0–3.0)
Eosinophils Absolute: 0.1 10*3/uL (ref 0.0–0.7)
Eosinophils Relative: 3.2 % (ref 0.0–5.0)
HCT: 36.5 % — ABNORMAL LOW (ref 39.0–52.0)
Hemoglobin: 11.8 g/dL — ABNORMAL LOW (ref 13.0–17.0)
Lymphocytes Relative: 31.3 % (ref 12.0–46.0)
Lymphs Abs: 1.3 10*3/uL (ref 0.7–4.0)
MCHC: 32.4 g/dL (ref 30.0–36.0)
MCV: 87.8 fl (ref 78.0–100.0)
Monocytes Absolute: 0.5 10*3/uL (ref 0.1–1.0)
Monocytes Relative: 12.8 % — ABNORMAL HIGH (ref 3.0–12.0)
Neutro Abs: 2.2 10*3/uL (ref 1.4–7.7)
Neutrophils Relative %: 51.8 % (ref 43.0–77.0)
Platelets: 199 10*3/uL (ref 150.0–400.0)
RBC: 4.16 Mil/uL — ABNORMAL LOW (ref 4.22–5.81)
RDW: 13.2 % (ref 11.5–15.5)
WBC: 4.2 10*3/uL (ref 4.0–10.5)

## 2018-12-24 LAB — COMPREHENSIVE METABOLIC PANEL
ALT: 12 U/L (ref 0–53)
AST: 15 U/L (ref 0–37)
Albumin: 3.7 g/dL (ref 3.5–5.2)
Alkaline Phosphatase: 58 U/L (ref 39–117)
BUN: 16 mg/dL (ref 6–23)
CO2: 28 mEq/L (ref 19–32)
Calcium: 8.8 mg/dL (ref 8.4–10.5)
Chloride: 104 mEq/L (ref 96–112)
Creatinine, Ser: 0.94 mg/dL (ref 0.40–1.50)
GFR: 102.96 mL/min (ref >60.00)
Glucose, Bld: 177 mg/dL — ABNORMAL HIGH (ref 70–99)
Potassium: 4.4 mEq/L (ref 3.5–5.1)
Sodium: 138 mEq/L (ref 135–145)
Total Bilirubin: 0.6 mg/dL (ref 0.2–1.2)
Total Protein: 7.1 g/dL (ref 6.0–8.3)

## 2018-12-24 LAB — LIPID PANEL
Cholesterol: 124 mg/dL (ref 0–200)
HDL: 57.6 mg/dL (ref >39.00)
LDL Cholesterol: 53 mg/dL (ref 0–99)
NonHDL: 66.72
Total CHOL/HDL Ratio: 2
Triglycerides: 71 mg/dL (ref 0.0–149.0)
VLDL: 14.2 mg/dL (ref 0.0–40.0)

## 2018-12-24 LAB — IBC + FERRITIN
Ferritin: 128.1 ng/mL (ref 22.0–322.0)
Iron: 93 ug/dL (ref 42–165)
Saturation Ratios: 37.1 % (ref 20.0–50.0)
Transferrin: 179 mg/dL — ABNORMAL LOW (ref 212.0–360.0)

## 2018-12-24 LAB — VITAMIN B12: Vitamin B-12: 523 pg/mL (ref 211–911)

## 2018-12-24 LAB — HEMOGLOBIN A1C: Hgb A1c MFr Bld: 7 % — ABNORMAL HIGH (ref 4.6–6.5)

## 2018-12-24 NOTE — Telephone Encounter (Signed)
Patient brought by [employer] application. Placed on Dr Darnelle Bos desk for review and completion.

## 2018-12-24 NOTE — Telephone Encounter (Signed)
Sent completed forms from physician to [facility] via interoffice.

## 2018-12-26 ENCOUNTER — Encounter: Payer: Self-pay | Admitting: *Deleted

## 2018-12-26 ENCOUNTER — Other Ambulatory Visit: Payer: Self-pay

## 2018-12-26 ENCOUNTER — Ambulatory Visit (INDEPENDENT_AMBULATORY_CARE_PROVIDER_SITE_OTHER): Payer: Managed Care, Other (non HMO) | Admitting: Family Medicine

## 2018-12-26 ENCOUNTER — Encounter: Payer: Self-pay | Admitting: Family Medicine

## 2018-12-26 VITALS — Ht 73.0 in | Wt 192.4 lb

## 2018-12-26 DIAGNOSIS — I5022 Chronic systolic (congestive) heart failure: Secondary | ICD-10-CM

## 2018-12-26 DIAGNOSIS — E119 Type 2 diabetes mellitus without complications: Secondary | ICD-10-CM | POA: Diagnosis not present

## 2018-12-26 DIAGNOSIS — E7800 Pure hypercholesterolemia, unspecified: Secondary | ICD-10-CM

## 2018-12-26 DIAGNOSIS — I428 Other cardiomyopathies: Secondary | ICD-10-CM

## 2018-12-26 DIAGNOSIS — I10 Essential (primary) hypertension: Secondary | ICD-10-CM | POA: Diagnosis not present

## 2018-12-26 DIAGNOSIS — D509 Iron deficiency anemia, unspecified: Secondary | ICD-10-CM

## 2018-12-26 NOTE — Assessment & Plan Note (Signed)
Borderline control at last check in early 11/2018. Continue current medication. Follow BPs at home.Marland Kitchen get new BP cuff.

## 2018-12-26 NOTE — Progress Notes (Signed)
VIRTUAL VISIT Due to national recommendations of social distancing due to [place] 19, a virtual visit is felt to be most appropriate for this patient at this time.   I connected with@ on 12/26/18 at  8:45 AM EDT by [facility] and verified that I am speaking with the correct person using two identifiers.   I discussed the limitations, risks, security and privacy concerns of performing an evaluation and management service by [facility] and the availability of in person appointments. I also discussed with the patient that there may be a patient responsible charge related to this service. The patient expressed understanding and agreed to proceed.  Patient location: Home Provider Location: [facility] Participants: Eliezer Lofts and [name]   Chief Complaint  Patient presents with  . Diabetes    History of Present Illness:   48 year old male presents for DM follow up.  Diabetes:  He is taking  500 mg daily.. he decreased from 2 tabs daily to 1 on his own given SE in 07/2018  Also Tuojeo 15 [place] daily.Marland Kitchen switching to balsalgar for cost. Lab Results  Component Value Date   HGBA1C 7.0 (H) 12/24/2018  He reports he is not getting any exercise.Marland Kitchen  He is snacking more lately.  Using medications without difficulties: Hypoglycemic episodes:not checking but feels better than he did when having lows at night Hyperglycemic episodes: Feet problems: Blood Sugars averaging:  Not checking blood sugar lately. eye exam within last year:  Hypertension:   Machine is not working well.  Not sure BP but was tolerable at last cardiolgy OV. BP Readings from Last 3 Encounters:  12/03/18 140/86  11/20/18 128/68  10/30/18 130/88  Using medication without problems or lightheadedness: none Chest pain with exertion:none Edema:none Short of breath:none Average home BPs: Other issues:   CHF: followed by Cardiologist. On BALDS therapy. Last OV 12/03/2018 Dr. [name] Continue Coreg 12.5 mg bid (fatigue  with attempted up-titration.  - Continue Entresto 97/103 and spironolactone 25 daily.  - Continue digoxin 0.125, digoxin level today.  - Add Bidil 1/2 tab tid.  - Volume status ok, I do not think that he needs a diuretic.  - I will arrange for cardiac MRI (will check to see if ICD is MRI compatible).  Will look for infiltrative disease, evidence for ventricular noncompaction.  He reports he had to stop Bidil for SE.. cardfiology aware.   Iron def anemia.. Followed by Dr. Janese Banks.Marland Kitchen getting iron infusions. Reviewed labs.. eh will follow up with [facility]  COVID 19 screen No recent travel or known exposure to [place] The patient denies respiratory symptoms of COVID 19 at this time.  The importance of social distancing was discussed today.   ROS    Past Medical History:  Diagnosis Date  . Acute blood loss anemia 11/20/2016  . AICD (automatic cardioverter/defibrillator) present    a. 06/2017 s/p MDT DTMA 1QQ [place] MRI Quad CRT-D SureScan (ser # [id number]).  . Chronic combined systolic (congestive) and diastolic (congestive) heart failure ([place])    a. 11/2016 Echo: EF 20-25%, glob HK, antsept, ant, apical HK, Gr1 DD, mild MR, mildly dil LA, nl RV fxn; b. 05/2017 Echo: Ef 20-25%, Gr2 DD, prominent apical trabeculations; c. 12/2017 Echo: EF 30-35%, Gr1 DD, glob HK.  . Diabetes mellitus ([place])   . Essential hypertension   . Family history of adverse reaction to anesthesia    mom had a hard time waking up  . GERD (gastroesophageal reflux disease)   . NICM (nonischemic  cardiomyopathy) ([place])    a. 11/2016 Cath: nl cors, EF 25-30%; b. 11/2016 Echo: EF 20-25%; c. 05/2017 Echo: EF 20-25%; d. 12/2017 Echo: EF 30-35%, Gr1 DD.  Marland Kitchen Slipped intervertebral disc    L4 L5  . STEMI (ST elevation myocardial infarction) ([place])    a. 11/2016 ST elevation -->Nl cors on cath.    reports that he has never smoked. He has never used smokeless tobacco. He reports current alcohol use of about 4.0 standard drinks of alcohol per week.  He reports that he does not use drugs.   Current Outpatient Medications:  .  calcium carbonate (OS-CAL) 600 MG TABS tablet, Take 1,200 mg by mouth daily with breakfast. , Disp: , Rfl:  .  carvedilol (COREG) 12.5 MG tablet, TAKE 1 TABLET BY MOUTH TWICE DAILY, Disp: 180 tablet, Rfl: 3 .  digoxin (LANOXIN) 0.125 MG tablet, Take 1 tablet (0.125 mg total) by mouth daily., Disp: 90 tablet, Rfl: 3 .  glucose blood (ONE TOUCH ULTRA TEST) test strip, Check blood sugar once daily and as instructed. Dx E11.9, Disp: 100 each, Rfl: 1 .  ibuprofen (ADVIL,MOTRIN) 600 MG tablet, Take 600 mg by mouth every 6 (six) hours as needed. for pain, Disp: , Rfl: 0 .  Insulin Glargine (BASAGLAR KWIKPEN) 100 UNIT/ML SOPN, Inject 0.15 mLs (15 Units total) into the skin daily., Disp: 3 mL, Rfl: 11 .  metFORMIN (GLUCOPHAGE) 500 MG tablet, Take 500 mg by mouth daily with breakfast., Disp: , Rfl:  .  Multiple Vitamins-Minerals (ADULT GUMMY PO), Take 2 tablets by mouth daily., Disp: , Rfl:  .  Omega-3 Fatty Acids (FISH OIL PO), Take 800 mg by mouth daily., Disp: , Rfl:  .  omeprazole (PRILOSEC) 40 MG capsule, Take 40 mg by mouth daily as needed (indigestion). , Disp: , Rfl:  .  sacubitril-valsartan (ENTRESTO) 97-103 MG, Take 1 tablet by mouth 2 (two) times daily., Disp: 180 tablet, Rfl: 3 .  spironolactone (ALDACTONE) 25 MG tablet, Take 1 tablet (25 mg total) by mouth daily., Disp: 90 tablet, Rfl: 0 .  isosorbide-hydrALAZINE (BIDIL) 20-37.5 MG tablet, Take 0.5 tablets by mouth 3 (three) times daily. (Patient not taking: Reported on 12/26/2018), Disp: 15 tablet, Rfl: 3   Observations/Objective: Height 6\' 1"  (1.854 m), weight 192 lb 6 oz (87.3 kg).  Physical Exam  Physical Exam Constitutional:      General: She is not in acute distress. Pulmonary:     Effort: Pulmonary effort is normal. No respiratory distress.  Neurological:     Mental Status: She is alert and oriented to person, place, and time.  Psychiatric:        Mood  and Affect: Mood normal.        Behavior: Behavior normal.    Assessment and Plan Chronic systolic heart failure (HCC) Pt report he is euvolemic today. Doping well on current therapy.  Reviewed last cardiology note.  Diabetes mellitus with no complication Pt unable to take full dose of metformin.Marland Kitchen PM dose makes him feel bad and were causing low FBS < 60 ( better now off PM metformin dose).  Changing over to [place] in next few weeks given cost. A1C rising but at 7 on 12/24/2018  Will have pt follow CBGs closely and will likely need med change.. will consider change from metformin to SGLT2-inhibitor given cardiac benefit. Encouraged exercise and return to low carb diet.  Will re-eval in 3 months.   HYPERCHOLESTEROLEMIA, PURE Well controlled. Continue current medication.   Essential hypertension, benign  Borderline control at last check in early 11/2018. Continue current medication. Follow BPs at home.Marland Kitchen get new BP cuff.   Iron deficiency anemia Reviewed labs.. followed by Dr. Janese Banks... pt will contact them for further recommendations on next infusion of iron.     I discussed the assessment and treatment plan with the patient. The patient was provided an opportunity to ask questions and all were answered. The patient agreed with the plan and demonstrated an understanding of the instructions.   The patient was advised to call back or seek an in-person evaluation if the symptoms worsen or if the condition fails to improve as anticipated.     Eliezer Lofts, MD

## 2018-12-26 NOTE — Patient Instructions (Addendum)
Work  On getting back to [employer] as tolerated.  Get back to low carbohydrate diet.Marland Kitchen stop anacking.  Continue Tuojeo 15 UNits for now... but after changing to [place].. make sure to check Fasting blood sugar every few days.Marland Kitchen goal < 120.  Follow up with Dr. Janese Banks as needed.  Shirlean Mylar will call to schedule DM follow up in  3 months with labs prior in office.

## 2018-12-26 NOTE — Assessment & Plan Note (Signed)
Reviewed labs.. followed by Dr. Janese Banks... pt will contact them for further recommendations on next infusion of iron.

## 2018-12-26 NOTE — Telephone Encounter (Signed)
I spoke with [name] by phone regarding his current heart failure symptoms in regard to his handicap placard request.  He was seen by Dr. [name] last month, who noted NYHA class II-III symptoms.  In the meantime, [name] reports less activity and has noticed shortness of breath when doing activity such as housework or walking through the parking lot at the grocery store, consistent with NYHA class III symptoms.  I have completed his handicap placard application with this indication.  It will be mailed to his home today.  [name] notes that he did not tolerate BiDil, which was started by Dr. [name] last month.  After beginning the medication, he felt "outside of himself."  He is otherwise compliant with his medications, which I encouraged him to continue.  He will follow-up as previously discussed.  [name], MD [facility] Pager: [phone]

## 2018-12-26 NOTE — Assessment & Plan Note (Signed)
Well controlled. Continue current medication.  

## 2018-12-26 NOTE — Assessment & Plan Note (Signed)
Pt unable to take full dose of metformin.Marland Kitchen PM dose makes him feel bad and were causing low FBS < 60 ( better now off PM metformin dose).  Changing over to [place] in next few weeks given cost. A1C rising but at 7 on 12/24/2018  Will have pt follow CBGs closely and will likely need med change.. will consider change from metformin to SGLT2-inhibitor given cardiac benefit. Encouraged exercise and return to low carb diet.  Will re-eval in 3 months.

## 2018-12-26 NOTE — Progress Notes (Signed)
AVS sent to [facility] on his MyChart as instructed by Dr. [name].

## 2018-12-26 NOTE — Assessment & Plan Note (Signed)
Pt report he is euvolemic today. Doping well on current therapy.  Reviewed last cardiology note.

## 2018-12-30 ENCOUNTER — Ambulatory Visit (INDEPENDENT_AMBULATORY_CARE_PROVIDER_SITE_OTHER): Payer: Managed Care, Other (non HMO) | Admitting: *Deleted

## 2018-12-30 ENCOUNTER — Other Ambulatory Visit: Payer: Self-pay

## 2018-12-30 DIAGNOSIS — I428 Other cardiomyopathies: Secondary | ICD-10-CM | POA: Diagnosis not present

## 2018-12-30 DIAGNOSIS — I5022 Chronic systolic (congestive) heart failure: Secondary | ICD-10-CM

## 2018-12-30 LAB — CUP PACEART REMOTE DEVICE CHECK
Battery Remaining Longevity: 90 mo
Battery Voltage: 2.99 V
Brady Statistic AP VP Percent: 0.04 %
Brady Statistic AP VS Percent: 0.01 %
Brady Statistic AS VP Percent: 98.71 %
Brady Statistic AS VS Percent: 1.24 %
Brady Statistic RA Percent Paced: 0.05 %
Brady Statistic RV Percent Paced: 65.39 %
Date Time Interrogation Session: 20200407073525
HighPow Impedance: 49 Ohm
Implantable Lead Implant Date: 20181003
Implantable Lead Implant Date: 20181003
Implantable Lead Implant Date: 20181003
Implantable Lead Location: 753858
Implantable Lead Location: 753859
Implantable Lead Location: 753860
Implantable Lead Model: 4398
Implantable Lead Model: 5076
Implantable Pulse Generator Implant Date: 20181003
Lead Channel Impedance Value: 165.029
Lead Channel Impedance Value: 172.541
Lead Channel Impedance Value: 172.541
Lead Channel Impedance Value: 189.525
Lead Channel Impedance Value: 199.5 Ohm
Lead Channel Impedance Value: 247 Ohm
Lead Channel Impedance Value: 304 Ohm
Lead Channel Impedance Value: 342 Ohm
Lead Channel Impedance Value: 361 Ohm
Lead Channel Impedance Value: 399 Ohm
Lead Channel Impedance Value: 399 Ohm
Lead Channel Impedance Value: 399 Ohm
Lead Channel Impedance Value: 513 Ohm
Lead Channel Impedance Value: 589 Ohm
Lead Channel Impedance Value: 589 Ohm
Lead Channel Impedance Value: 589 Ohm
Lead Channel Impedance Value: 646 Ohm
Lead Channel Impedance Value: 665 Ohm
Lead Channel Pacing Threshold Amplitude: 0.5 V
Lead Channel Pacing Threshold Amplitude: 0.875 V
Lead Channel Pacing Threshold Amplitude: 1 V
Lead Channel Pacing Threshold Pulse Width: 0.4 ms
Lead Channel Pacing Threshold Pulse Width: 0.4 ms
Lead Channel Pacing Threshold Pulse Width: 0.4 ms
Lead Channel Sensing Intrinsic Amplitude: 2.125 mV
Lead Channel Sensing Intrinsic Amplitude: 2.125 mV
Lead Channel Sensing Intrinsic Amplitude: 7.875 mV
Lead Channel Sensing Intrinsic Amplitude: 7.875 mV
Lead Channel Setting Pacing Amplitude: 1.75 V
Lead Channel Setting Pacing Amplitude: 2 V
Lead Channel Setting Pacing Amplitude: 2.5 V
Lead Channel Setting Pacing Pulse Width: 0.4 ms
Lead Channel Setting Pacing Pulse Width: 0.4 ms
Lead Channel Setting Sensing Sensitivity: 0.3 mV

## 2018-12-31 ENCOUNTER — Encounter (HOSPITAL_COMMUNITY): Payer: Managed Care, Other (non HMO)

## 2019-01-01 NOTE — Telephone Encounter (Signed)
Sent more forms for physican to complete/sg

## 2019-01-02 NOTE — Telephone Encounter (Signed)
Sent completed forms to [employer] 

## 2019-01-05 ENCOUNTER — Inpatient Hospital Stay (HOSPITAL_COMMUNITY): Admission: RE | Admit: 2019-01-05 | Payer: Managed Care, Other (non HMO) | Source: Ambulatory Visit

## 2019-01-05 ENCOUNTER — Telehealth (HOSPITAL_COMMUNITY): Payer: Self-pay | Admitting: Pharmacist

## 2019-01-05 MED ORDER — SACUBITRIL-VALSARTAN 97-103 MG PO TABS: 1.0000 | ORAL_TABLET | Freq: Two times a day (BID) | ORAL | 3 refills | Status: AC

## 2019-01-05 MED ORDER — SPIRONOLACTONE 25 MG PO TABS: 25.0000 mg | ORAL_TABLET | Freq: Every day | ORAL | 3 refills | Status: AC

## 2019-01-05 MED ORDER — DIGOXIN 125 MCG PO TABS: 0.1250 mg | ORAL_TABLET | Freq: Every day | ORAL | 3 refills | Status: DC

## 2019-01-05 NOTE — Telephone Encounter (Signed)
Pharmacy medication titration clinic tele call d/t COVID and in ability for office visit.

## 2019-01-05 NOTE — Telephone Encounter (Signed)
Pharmacy medication clinic tele call d/t office visit restrictions d/t COVID.  Patient felling well, compliant with low salt diet but has been much more sedentary and eating more - has gained some weight - recently started at a gym - now closed so walking at [facility].  He can walk a few laps without becoming SOB.   Reviewed medications  - states compliance. Has stopped taking Bidil - did not tolerate - felt out of his head and dizzy s/p 1/2 tab on 3 occassions.   He dose not check BP/HR - but states not dizzy except when he took Bidil He does not weigh daily - but states no swelling in ankles or calves.  Last labs stable  No medication changes made as no vitals available and inability to obtain labs.   Refills sent.  Bonnita Nasuti Pharm.D. CPP, BCPS Clinical Pharmacist [phone] 01/05/2019 11:51 AM

## 2019-01-07 ENCOUNTER — Encounter: Payer: Self-pay | Admitting: Cardiology

## 2019-01-07 ENCOUNTER — Telehealth: Payer: Self-pay | Admitting: *Deleted

## 2019-01-07 NOTE — Progress Notes (Signed)
Remote ICD transmission.   

## 2019-01-07 NOTE — Telephone Encounter (Signed)
Virtual Visit Pre-Appointment Phone Call  Steps For Call:  Confirm consent - "In the setting of the current Covid19 crisis, you are scheduled for a (VIDEO) visit with your provider on (01/12/2019) at (3:00PM).  Just as we do with many in-office visits, in order for you to participate in this visit, we must obtain consent.  If you'd like, I can send this to your mychart (if signed up) or email for you to review.  Otherwise, I can obtain your verbal consent now.  All virtual visits are billed to your insurance company just like a normal visit would be.  By agreeing to a virtual visit, we'd like you to understand that the technology does not allow for your provider to perform an examination, and thus may limit your provider's ability to fully assess your condition.  Finally, though the technology is pretty good, we cannot assure that it will always work on either your or our end, and in the setting of a video visit, we may have to convert it to a phone-only visit.  In either situation, we cannot ensure that we have a secure connection.  Are you willing to proceed?" YES  1. Confirm the BEST phone number to call the day of the visit by including in appointment notes  2. Give patient instructions for WebEx/MyChart download to smartphone as below or Doximity/Doxy.me if video visit (depending on what platform provider is using)  3. Advise patient to be prepared with their blood pressure, heart rate, weight, any heart rhythm information, their current medicines, and a piece of paper and pen handy for any instructions they may receive the day of their visit  4. Inform patient they will receive a phone call 15 minutes prior to their appointment time (may be from unknown caller ID) so they should be prepared to answer  5. Confirm that appointment type is correct in Epic appointment notes (VIDEO vs PHONE)     TELEPHONE CALL NOTE  Philip Ponce has been deemed a candidate for a follow-up tele-health  visit to limit community exposure during the Covid-19 pandemic. I spoke with the patient via phone to ensure availability of phone/video source, confirm preferred email & phone number, and discuss instructions and expectations.  I reminded Philip Ponce to be prepared with any vital sign and/or heart rhythm information that could potentially be obtained via home monitoring, at the time of his visit. I reminded Philip Ponce to expect a phone call at the time of his visit if his visit.  Philip Ponce, [place], CMA 01/07/2019 2:04 PM   INSTRUCTIONS FOR DOWNLOADING THE [place] APP TO SMARTPHONE  - If Apple, ask patient to go to [employer] and type in WebEx in the search bar. [place] [employer], the blue/green circle. If Android, go to [employer] and type in [employer] in the search bar. The app is free but as with any other app downloads, their phone may require them to verify saved payment information or Apple/Android password.  - The patient does NOT have to create an account. - On the day of the visit, the assist will walk the patient through joining the meeting with the meeting number/password.  INSTRUCTIONS FOR DOWNLOADING THE MYCHART APP TO SMARTPHONE  - The patient must first make sure to have activated MyChart and know their login information - If Apple, go to [employer] and type in MyChart in the search bar and download the app. If Android, ask patient to go  to [employer] and type in [place] in the search bar and download the app. The app is free but as with any other app downloads, their phone may require them to verify saved payment information or Apple/Android password.  - The patient will need to then log into the app with their MyChart username and password, and select [facility] as their healthcare provider to link the account. When it is time for your visit, go to the MyChart app, find appointments, and click Begin Video Visit. Be sure to Select Allow for your device  to access the Microphone and Camera for your visit. You will then be connected, and your provider will be with you shortly.  **If they have any issues connecting, or need assistance please contact MyChart service desk [phone])**  **If using a computer, in order to ensure the best quality for their visit they will need to use either of the following Internet Browsers: [employer], or Google Chrome**  IF USING DOXIMITY or DOXY.ME - The patient will receive a link just prior to their visit, either by text or email (to be determined day of appointment depending on if it's doxy.me or Doximity).     FULL LENGTH CONSENT FOR TELE-HEALTH VISIT   I hereby voluntarily request, consent and authorize [facility] and its employed or contracted physicians, physician assistants, nurse practitioners or other licensed health care professionals (the Practitioner), to provide me with telemedicine health care services (the Services") as deemed necessary by the treating Practitioner. I acknowledge and consent to receive the Services by the Practitioner via telemedicine. I understand that the telemedicine visit will involve communicating with the Practitioner through live audiovisual communication technology and the disclosure of certain medical information by electronic transmission. I acknowledge that I have been given the opportunity to request an in-person assessment or other available alternative prior to the telemedicine visit and am voluntarily participating in the telemedicine visit.  I understand that I have the right to withhold or withdraw my consent to the use of telemedicine in the course of my care at any time, without affecting my right to future care or treatment, and that the Practitioner or I may terminate the telemedicine visit at any time. I understand that I have the right to inspect all information obtained and/or recorded in the course of the telemedicine visit and may receive  copies of available information for a reasonable fee.  I understand that some of the potential risks of receiving the Services via telemedicine include:   Delay or interruption in medical evaluation due to technological equipment failure or disruption;  Information transmitted may not be sufficient (e.g. poor resolution of images) to allow for appropriate medical decision making by the Practitioner; and/or   In rare instances, security protocols could fail, causing a breach of personal health information.  Furthermore, I acknowledge that it is my responsibility to provide information about my medical history, conditions and care that is complete and accurate to the best of my ability. I acknowledge that Practitioner's advice, recommendations, and/or decision may be based on factors not within their control, such as incomplete or inaccurate data provided by me or distortions of diagnostic images or specimens that may result from electronic transmissions. I understand that the practice of medicine is not an exact science and that Practitioner makes no warranties or guarantees regarding treatment outcomes. I acknowledge that I will receive a copy of this consent concurrently upon execution via email to the email address I last provided  but may also request a printed copy by calling the office of [facility].    I understand that my insurance will be billed for this visit.   I have read or had this consent read to me.  I understand the contents of this consent, which adequately explains the benefits and risks of the Services being provided via telemedicine.   I have been provided ample opportunity to ask questions regarding this consent and the Services and have had my questions answered to my satisfaction.  I give my informed consent for the services to be provided through the use of telemedicine in my medical care  By participating in this telemedicine visit I agree to the above.

## 2019-01-08 ENCOUNTER — Other Ambulatory Visit: Payer: Self-pay

## 2019-01-09 ENCOUNTER — Other Ambulatory Visit: Payer: Self-pay

## 2019-01-09 ENCOUNTER — Inpatient Hospital Stay: Payer: Managed Care, Other (non HMO) | Attending: Oncology

## 2019-01-09 DIAGNOSIS — D509 Iron deficiency anemia, unspecified: Secondary | ICD-10-CM

## 2019-01-09 DIAGNOSIS — Z9884 Bariatric surgery status: Secondary | ICD-10-CM | POA: Insufficient documentation

## 2019-01-09 DIAGNOSIS — E538 Deficiency of other specified B group vitamins: Secondary | ICD-10-CM

## 2019-01-09 LAB — CBC WITH DIFFERENTIAL/PLATELET
Abs Immature Granulocytes: 0.01 10*3/uL (ref 0.00–0.07)
Basophils Absolute: 0 10*3/uL (ref 0.0–0.1)
Basophils Relative: 1 %
Eosinophils Absolute: 0.1 10*3/uL (ref 0.0–0.5)
Eosinophils Relative: 3 %
HCT: 38.4 % — ABNORMAL LOW (ref 39.0–52.0)
Hemoglobin: 11.8 g/dL — ABNORMAL LOW (ref 13.0–17.0)
Immature Granulocytes: 0 %
Lymphocytes Relative: 32 %
Lymphs Abs: 1.2 10*3/uL (ref 0.7–4.0)
MCH: 27.3 pg (ref 26.0–34.0)
MCHC: 30.7 g/dL (ref 30.0–36.0)
MCV: 88.9 fL (ref 80.0–100.0)
Monocytes Absolute: 0.5 10*3/uL (ref 0.1–1.0)
Monocytes Relative: 12 %
Neutro Abs: 2.1 10*3/uL (ref 1.7–7.7)
Neutrophils Relative %: 52 %
Platelets: 183 10*3/uL (ref 150–400)
RBC: 4.32 MIL/uL (ref 4.22–5.81)
RDW: 12.4 % (ref 11.5–15.5)
WBC: 3.9 10*3/uL — ABNORMAL LOW (ref 4.0–10.5)
nRBC: 0 % (ref 0.0–0.2)

## 2019-01-09 LAB — IRON AND TIBC
Iron: 84 ug/dL (ref 45–182)
Saturation Ratios: 31 % (ref 17.9–39.5)
TIBC: 269 ug/dL (ref 250–450)
UIBC: 185 ug/dL

## 2019-01-09 LAB — FERRITIN: Ferritin: 117 ng/mL (ref 24–336)

## 2019-01-09 LAB — VITAMIN B12: Vitamin B-12: 374 pg/mL (ref 180–914)

## 2019-01-10 NOTE — Progress Notes (Signed)
No show

## 2019-01-12 ENCOUNTER — Encounter: Payer: Self-pay | Admitting: Internal Medicine

## 2019-01-12 ENCOUNTER — Other Ambulatory Visit: Payer: Self-pay

## 2019-01-12 ENCOUNTER — Telehealth (INDEPENDENT_AMBULATORY_CARE_PROVIDER_SITE_OTHER): Payer: Managed Care, Other (non HMO) | Admitting: Internal Medicine

## 2019-01-12 DIAGNOSIS — I5022 Chronic systolic (congestive) heart failure: Secondary | ICD-10-CM

## 2019-01-13 NOTE — Progress Notes (Signed)
Virtual Visit via Video Note   This visit type was conducted due to national recommendations for restrictions regarding the COVID-19 Pandemic (e.g. social distancing) in an effort to limit this patient's exposure and mitigate transmission in our community.  Due to his co-morbid illnesses, this patient is at least at moderate risk for complications without adequate follow up.  This format is felt to be most appropriate for this patient at this time.  All issues noted in this document were discussed and addressed.  A limited physical exam was performed with this format.  Please refer to the patient's chart for his consent to telehealth for [facility].   Evaluation Performed:  Follow-up visit  Date:  01/15/2019   ID:  Philip Ponce, DOB [birth year], MRN [id number]  Patient Location: Home Provider Location: Office  PCP:  [name], MD  Cardiologist:  [name], MD, Loralie Champagne, MD (Heart Failure) Electrophysiologist:  Allegra Lai, MD   Chief Complaint: Follow-up heart failure  History of Present Illness:    Philip Ponce is a 48 y.o. male with history of chronic systolic heart failure secondary to nonischemic cardiomyopathy, hypertension, and type 2 diabetes mellitus.  We are speaking today for follow-up of his heart failure.  He was last seen in our office by Philip Bayley, NP, in February.  At that time, Philip Ponce reported improved exercise capacity after being started on digoxin.  He also noted this when seen by Philip Ponce in the heart failure clinic.  He was started on BiDil at that time but did not tolerate this medication.  Today, Philip Ponce reports that he is doing relatively well.  He has not been exercising as much recently due to COVID-19 precautions as well as his wife's illness.  She required emergency surgery in [place] but has recovered well and is back home now.  His weight has been relatively stable.  He has mild exertional dyspnea when walking  extended distances, unchanged.  He also notes brief chest tightness, usually right before taking his scheduled heart failure medications.  He does not have exertional chest pain.  He has not had any significant edema nor palpitations or lightheadedness.  He remains compliant with his medications.  He hopes to begin exercising more regularly in the next few weeks.  The patient does not have symptoms concerning for COVID-19 infection (fever, chills, cough, or new shortness of breath).    Past Medical History:  Diagnosis Date  . Acute blood loss anemia 11/20/2016  . AICD (automatic cardioverter/defibrillator) present    a. 06/2017 s/p MDT DTMA 1QQ [place] MRI Quad CRT-D SureScan (ser # [id number]).  . Chronic combined systolic (congestive) and diastolic (congestive) heart failure ([place])    a. 11/2016 Echo: EF 20-25%, glob HK, antsept, ant, apical HK, Gr1 DD, mild MR, mildly dil LA, nl RV fxn; b. 05/2017 Echo: Ef 20-25%, Gr2 DD, prominent apical trabeculations; c. 12/2017 Echo: EF 30-35%, Gr1 DD, glob HK.  . Diabetes mellitus ([place])   . Essential hypertension   . Family history of adverse reaction to anesthesia    mom had a hard time waking up  . GERD (gastroesophageal reflux disease)   . NICM (nonischemic cardiomyopathy) ([place])    a. 11/2016 Cath: nl cors, EF 25-30%; b. 11/2016 Echo: EF 20-25%; c. 05/2017 Echo: EF 20-25%; d. 12/2017 Echo: EF 30-35%, Gr1 DD.  Marland Kitchen Slipped intervertebral disc    L4 L5  . STEMI (ST elevation myocardial infarction) ([place])    a. 11/2016  ST elevation -->Nl cors on cath.   Past Surgical History:  Procedure Laterality Date  . BIV ICD INSERTION CRT-D  06/26/2017  . BIV ICD INSERTION CRT-D N/A 06/26/2017   Procedure: BIV ICD INSERTION CRT-D;  Surgeon: Philip Haw, MD;  Location: [facility];  Service: Cardiovascular;  Laterality: N/A;  . CERVICAL DISC ARTHROPLASTY N/A 07/24/2017   Procedure: CERVICAL ANTERIOR [place] ARTHROPLASTY C5-C7;  Surgeon: Philip Maw, MD;   Location: [facility] ORS;  Service: Neurosurgery;  Laterality: N/A;  . COLONOSCOPY WITH PROPOFOL N/A 12/03/2016   Procedure: COLONOSCOPY WITH PROPOFOL;  Surgeon: Philip Bellows, MD;  Location: [facility] ENDOSCOPY;  Service: Endoscopy;  Laterality: N/A;  . ESOPHAGOGASTRODUODENOSCOPY (EGD) WITH PROPOFOL N/A 12/03/2016   Procedure: ESOPHAGOGASTRODUODENOSCOPY (EGD) WITH PROPOFOL;  Surgeon: Philip Bellows, MD;  Location: [facility] ENDOSCOPY;  Service: Endoscopy;  Laterality: N/A;  . FINGER SURGERY Right 2012   index finger  . GASTRIC BYPASS    . GIVENS CAPSULE STUDY N/A 01/07/2017   Procedure: GIVENS CAPSULE STUDY;  Surgeon: Philip Bellows, MD;  Location: [facility] ENDOSCOPY;  Service: Endoscopy;  Laterality: N/A;  . LEFT HEART CATH AND CORONARY ANGIOGRAPHY N/A 12/04/2016   Procedure: Left Heart Cath and Coronary Angiography;  Surgeon: [name], MD;  Location: [facility];  Service: Cardiovascular;  Laterality: N/A;  . SHOULDER ARTHROSCOPY WITH OPEN ROTATOR CUFF REPAIR Left 10/04/2017   Procedure: SHOULDER ARTHROSCOPY WITH ROTATOR CUFF REPAIR, SUBACROMIAL DECOMPRESSION,OPEN BICEP TENODESIS, EXTENSIVE DEBRIDEMENT;  Surgeon: Philip Fabry, MD;  Location: [facility] ORS;  Service: Orthopedics;  Laterality: Left;     Current Meds  Medication Sig  . amoxicillin (AMOXIL) 500 MG tablet Take 500 mg by mouth daily. Take for 7 days.  . calcium carbonate (OS-CAL) 600 MG TABS tablet Take 1,200 mg by mouth daily with breakfast.   . carvedilol (COREG) 12.5 MG tablet TAKE 1 TABLET BY MOUTH TWICE DAILY  . digoxin (LANOXIN) 0.125 MG tablet Take 1 tablet (0.125 mg total) by mouth daily.  Marland Kitchen glucose blood (ONE TOUCH ULTRA TEST) test strip Check blood sugar once daily and as instructed. Dx E11.9  . ibuprofen (ADVIL,MOTRIN) 600 MG tablet Take 600 mg by mouth every 6 (six) hours as needed. for pain  . Insulin Glargine (BASAGLAR KWIKPEN) 100 UNIT/ML SOPN Inject 0.15 mLs (15 Units total) into the skin daily.  . metFORMIN (GLUCOPHAGE) 500 MG tablet  Take 500 mg by mouth daily with breakfast.  . Multiple Vitamins-Minerals (ADULT GUMMY PO) Take 2 tablets by mouth daily.  . Omega-3 Fatty Acids (FISH OIL PO) Take 800 mg by mouth daily.  Marland Kitchen omeprazole (PRILOSEC) 40 MG capsule Take 40 mg by mouth daily as needed (indigestion).   . sacubitril-valsartan (ENTRESTO) 97-103 MG Take 1 tablet by mouth 2 (two) times daily.  Marland Kitchen spironolactone (ALDACTONE) 25 MG tablet Take 1 tablet (25 mg total) by mouth daily.     Allergies:   Mobic [meloxicam]; Tramadol; and Diclofenac   Social History   Tobacco Use  . Smoking status: Never Smoker  . Smokeless tobacco: Never Used  Substance Use Topics  . Alcohol use: Yes    Alcohol/week: 4.0 standard drinks    Types: 4 Glasses of wine per week    Frequency: Never  . Drug use: No     Family Hx: The patient's family history includes Cancer in his father, maternal grandfather, and mother; Diabetes in his father and mother; Hypertension in his father and mother; Stroke in his maternal grandfather.  ROS:   Please see  the history of present illness.   All other systems reviewed and are negative.   Prior CV studies:   The following studies were reviewed today:  Cath/PCI:  LHC (12/04/16): No angiographically significant CAD. Moderate to severe LV contractile dysfunction (LVEF 30-35%) with mildly elevated left ventricular filling pressure.  LHC (09/28/13, [facility]): No significant CAD. LVEF 45%.  EP Procedures and Devices:  BiV-ICD placement (06/26/17, Dr. Curt Bears): Medtronic  Non-Invasive Evaluation(s):  Limited TTE (01/06/18): Mildly dilated LV with normal wall thickness. LVEF 30-35% with grade 1 diastolic dysfunction and global hypokinesis.  TTE (05/28/17): Moderately dilated LV with normal wall thickness. LVEF 20-25% with diffuse hypokinesis and grade 2 diastolic dysfunction. Normal RV size and function.  TTE (12/05/16): Moderately dilated left ventricle with LVEF of 20-25% and global hypokinesis. Grade 1  diastolic dysfunction. Mild MR. Mild left atrial enlargement. Normal RV size and function.  TTE (04/14/15): Normal LV size with mild LVH. LVEF 50-55% with normal wall motion. Grade 1 diastolic dysfunction. Mild left atrial enlargement. Normal RV size and function. No significant valvular abnormalities.  Labs/Other Tests and Data Reviewed:    EKG:  An ECG dated 11/20/2018 was personally reviewed today and demonstrated:  Paced rhythm.  Recent Labs: 10/30/2018: BNP 97.2 12/24/2018: ALT 12; BUN 16; Creatinine, Ser 0.94; Potassium 4.4; Sodium 138 01/09/2019: Hemoglobin 11.8; Platelets 183   Recent Lipid Panel Lab Results  Component Value Date/Time   CHOL 124 12/24/2018 09:41 AM   TRIG 71.0 12/24/2018 09:41 AM   HDL 57.60 12/24/2018 09:41 AM   CHOLHDL 2 12/24/2018 09:41 AM   LDLCALC 53 12/24/2018 09:41 AM    Wt Readings from Last 3 Encounters:  01/15/19 192 lb (87.1 kg)  12/26/18 192 lb 6 oz (87.3 kg)  12/03/18 196 lb 6.4 oz (89.1 kg)     Objective:    Vital Signs:  Ht 6\' 1"  (1.854 m)   Wt 192 lb (87.1 kg)   BMI 25.33 kg/m    VITAL SIGNS:  reviewed GEN:  no acute distress  ASSESSMENT & PLAN:    Chronic systolic and diastolic heart failure due to nonischemic cardiomyopathy: Mr. Brann is doing well with NYHA class II-III heart failure symptoms.  Unfortunately, he was intolerant of BiDil.  He remains on maximal tolerated doses of carvedilol and Entresto, as well as spironolactone and digoxin.  I will not make any medication changes today.  Mr. Lant is scheduled for virtual follow-up with Philip Ponce as well regarding further evaluation and management of his cardiomyopathy.  I encouraged Mr. Sallade to begin exercising when possible while maintaining social distancing precautions.  Type 2 diabetes mellitus: Most recent hemoglobin A1c 12/24/2018 was 7.0, up from 5.6 in 05/2018.  Addition of empagliflozin should be considered for optimal glycemic control as well as the medications  cardiovascular benefits.  I will defer this to Dr. [name].  COVID-19 Education: The signs and symptoms of COVID-19 were discussed with the patient and how to seek care for testing (follow up with PCP or arrange E-visit).  The importance of social distancing was discussed today.  Time:   Today, I have spent 12 minutes with the patient with telehealth technology discussing the above problems.     Medication Adjustments/Labs and Tests Ordered: Current medicines are reviewed at length with the patient today.  Concerns regarding medicines are outlined above.   Tests Ordered: No orders of the defined types were placed in this encounter.   Medication Changes: No orders of the defined types were placed in  this encounter.   Disposition:  Follow up in 3 month(s)  Signed, [name], MD  01/15/2019 10:06 AM    [place]

## 2019-01-15 ENCOUNTER — Encounter: Payer: Self-pay | Admitting: Internal Medicine

## 2019-01-15 ENCOUNTER — Other Ambulatory Visit: Payer: Self-pay

## 2019-01-15 ENCOUNTER — Telehealth (INDEPENDENT_AMBULATORY_CARE_PROVIDER_SITE_OTHER): Payer: Managed Care, Other (non HMO) | Admitting: Internal Medicine

## 2019-01-15 ENCOUNTER — Ambulatory Visit (HOSPITAL_COMMUNITY)
Admission: RE | Admit: 2019-01-15 | Discharge: 2019-01-15 | Disposition: A | Discharge status: Home / Self Care | Payer: Managed Care, Other (non HMO) | Source: Ambulatory Visit | Attending: Cardiology | Admitting: Cardiology

## 2019-01-15 VITALS — Ht 73.0 in | Wt 192.0 lb

## 2019-01-15 DIAGNOSIS — Z794 Long term (current) use of insulin: Secondary | ICD-10-CM

## 2019-01-15 DIAGNOSIS — I5042 Chronic combined systolic (congestive) and diastolic (congestive) heart failure: Secondary | ICD-10-CM | POA: Diagnosis not present

## 2019-01-15 DIAGNOSIS — I428 Other cardiomyopathies: Secondary | ICD-10-CM | POA: Diagnosis not present

## 2019-01-15 DIAGNOSIS — E119 Type 2 diabetes mellitus without complications: Secondary | ICD-10-CM | POA: Diagnosis not present

## 2019-01-15 MED ORDER — EMPAGLIFLOZIN 10 MG PO TABS: 10.0000 mg | ORAL_TABLET | Freq: Every day | ORAL | 3 refills | Status: DC

## 2019-01-15 NOTE — Patient Instructions (Signed)
Medication Instructions:  Your physician recommends that you continue on your current medications as directed. Please refer to the Current Medication list given to you today.  If you need a refill on your cardiac medications before your next appointment, please call your pharmacy.   Lab work: none If you have labs (blood work) drawn today and your tests are completely normal, you will receive your results only by: . MyChart Message (if you have MyChart) OR . A paper copy in the mail If you have any lab test that is abnormal or we need to change your treatment, we will call you to review the results.  Testing/Procedures: none  Follow-Up: At [facility], you and your health needs are our priority.  As part of our continuing mission to provide you with exceptional heart care, we have created designated Provider Care Teams.  These Care Teams include your primary Cardiologist (physician) and Advanced Practice Providers (APPs -  Physician Assistants and Nurse Practitioners) who all work together to provide you with the care you need, when you need it. You will need a follow up appointment in 3-4 months.  Please call our office 2 months in advance to schedule this appointment.  You may see Christopher End, MD or one of the following Advanced Practice Providers on your designated Care Team:   Christopher Berge, NP Ryan Dunn, PA-C . Jacquelyn Visser, PA-C     

## 2019-01-15 NOTE — Progress Notes (Signed)
Heart Failure TeleHealth Note  Due to national recommendations of social distancing due to [place] 19, Audio/video telehealth visit is felt to be most appropriate for this patient at this time.  See MyChart message from today for patient consent regarding telehealth for [facility].  Date:  01/15/2019   ID:  Philip Ponce, DOB 03-Aug-1971, MRN [id number]  Location: Home  Provider location: [place] [facility] Failure Type of Visit: Established patient   PCP:  [name], MD  Cardiologist:  [name], MD Primary HF: Dr. [name]  Chief Complaint: Fatigue   History of Present Illness: Philip Ponce is a 48 y.o. male who presents via audio/video conferencing for a telehealth visit today.     he denies symptoms worrisome for COVID 19.   Patient has a history of chronic systolic CHF and diabetes. He has had a cardiomyopathy known since around 2015.  No coronary disease by cath, last was in 3/18.  He has a Medtronic CRT-D device. Last echo in 4/19 with EF 30-35.    At last appointment, I put him on Bidil.  He did not tolerate even low dose Bidil (1/2 tab tid), he felt "weird" on it.  He has been doing well otherwise.  No exertional dyspnea though not getting much exercise with coronavirus restrictions.  No lightheadedness.  No chest pain.  No orthopnea/PND.  Taking all his meds.   Labs (2/20): K 4.8, creatinine 0.99, hgb 12.2 Labs (3/20): digoxin 0.3 Labs (4/20): K 4.4, creatinine 0.94, hgb A1c 7.0, LDL 53  PMH:  1. Type 2 diabetes. 2. HTN 3. Chronic systolic CHF: Nonischemic cardiomyopathy.  Medtronic CRT-D device.  - LHC (2015): EF 45%, no significant coronary disease.  - Echo (3/18): EF 20-25%.  - LHC (3/18): normal coronaries.  - Echo (9/18): EF 20-25%, prominent apical trabeculations.  - Echo (4/19): EF 30-35%.   Current Outpatient Medications  Medication Sig Dispense Refill  . amoxicillin (AMOXIL) 500 MG tablet Take 500 mg by mouth daily. Take  for 7 days.    . calcium carbonate (OS-CAL) 600 MG TABS tablet Take 1,200 mg by mouth daily with breakfast.     . carvedilol (COREG) 12.5 MG tablet TAKE 1 TABLET BY MOUTH TWICE DAILY 180 tablet 3  . digoxin (LANOXIN) 0.125 MG tablet Take 1 tablet (0.125 mg total) by mouth daily. 90 tablet 3  . empagliflozin (JARDIANCE) 10 MG TABS tablet Take 10 mg by mouth daily. 30 tablet 3  . glucose blood (ONE TOUCH ULTRA TEST) test strip Check blood sugar once daily and as instructed. Dx E11.9 100 each 1  . ibuprofen (ADVIL,MOTRIN) 600 MG tablet Take 600 mg by mouth every 6 (six) hours as needed. for pain  0  . Insulin Glargine (BASAGLAR KWIKPEN) 100 UNIT/ML SOPN Inject 0.15 mLs (15 Units total) into the skin daily. 3 mL 11  . metFORMIN (GLUCOPHAGE) 500 MG tablet Take 500 mg by mouth daily with breakfast.    . Multiple Vitamins-Minerals (ADULT GUMMY PO) Take 2 tablets by mouth daily.    . Omega-3 Fatty Acids (FISH OIL PO) Take 800 mg by mouth daily.    Marland Kitchen omeprazole (PRILOSEC) 40 MG capsule Take 40 mg by mouth daily as needed (indigestion).     . sacubitril-valsartan (ENTRESTO) 97-103 MG Take 1 tablet by mouth 2 (two) times daily. 180 tablet 3  . spironolactone (ALDACTONE) 25 MG tablet Take 1 tablet (25 mg total) by mouth daily. 90 tablet 3  No current facility-administered medications for this encounter.     Allergies:   Mobic [meloxicam]; Tramadol; and Diclofenac   Social History:  The patient  reports that he has never smoked. He has never used smokeless tobacco. He reports current alcohol use of about 4.0 standard drinks of alcohol per week. He reports that he does not use drugs.   Family History:  The patient's family history includes Cancer in his father, maternal grandfather, and mother; Diabetes in his father and mother; Hypertension in his father and mother; Stroke in his maternal grandfather.   ROS:  Please see the history of present illness.   All other systems are personally reviewed and  negative.   Exam:  (Video/Tele Health Call; Exam is subjective and or/visual.) General:  Speaks in full sentences. No resp difficulty. Neck: No JVD Lungs: Normal respiratory effort with conversation.  Abdomen: Non-distended per patient report Extremities: Pt denies edema. Neuro: Alert & oriented x 3.   Recent Labs: 10/30/2018: BNP 97.2 12/24/2018: ALT 12; BUN 16; Creatinine, Ser 0.94; Potassium 4.4; Sodium 138 01/09/2019: Hemoglobin 11.8; Platelets 183  Personally reviewed   Wt Readings from Last 3 Encounters:  01/15/19 87.1 kg (192 lb)  12/26/18 87.3 kg (192 lb 6 oz)  12/03/18 89.1 kg (196 lb 6.4 oz)     ASSESSMENT AND PLAN:  1. Chronic systolic CHF: Nonischemic cardiomyopathy (cath in 4/19 with no significant coronary disease).  Medtronic CRT-D device.  Cause uncertain, familial is not out of question but mother was older (in 69s) when she developed CHF. One prior echo report was concerned for possible noncompaction.  NYHA class II symptoms, improved. He is not volume overloaded by video exam.  - Continue Coreg 12.5 mg bid (fatigue with attempted up-titration).  - Continue Entresto 97/103 and spironolactone 25 daily.  - Continue digoxin 0.125, recent level ok.   - He was unable to tolerate Bidil 1/2 tab tid.  - Volume status ok, I do not think that he needs a diuretic.  - CPX will be arranged for prognostic purposes, this will have to be done after coronavirus restrictions are lifted.   2. Type 2 diabetes: SGLT2-inhibitor would be a good next step in treatment given effect on the heart. Last HgbA1c was 7.0.  I would like him to start dapagliflozin or empagliflozin, will see if his insurance will. Cover.   COVID screen The patient does not have any symptoms that suggest any further testing/ screening at this time.  Social distancing reinforced today.  Patient Risk: After full review of this patients clinical status, I feel that they are at moderate risk for cardiac decompensation at  this time.  Relevant cardiac medications were reviewed at length with the patient today. The patient does not have concerns regarding their medications at this time.   Recommended follow-up:  3 months  Today, I have spent 20 minutes with the patient with telehealth technology discussing the above issues .    Signed, Loralie Champagne, MD  01/15/2019 4:16 PM  [place] [address] [facility] [place] [address] [phone] (office) [phone] (fax)

## 2019-01-15 NOTE — Progress Notes (Signed)
Spoke to pt to review after visit summary. Pt verbalized understanding and is agreeable. copay card to be mailed with avs.

## 2019-01-15 NOTE — Patient Instructions (Signed)
Lab work will need to be done in 2 weeks. This is scheduled for May 7th at 9:30am. The parking code for May is 8006.  START Jardiance 10mg  daily. You will be receiving a copay card in the mail.  Your physician has recommended that you have a cardiopulmonary stress test (CPX). CPX testing is a non-invasive measurement of heart and lung function. It replaces a traditional treadmill stress test. This type of test provides a tremendous amount of information that relates not only to your present condition but also for future outcomes. This test combines measurements of you ventilation, respiratory gas exchange in the lungs, electrocardiogram (EKG), blood pressure and physical response before, during, and following an exercise protocol. This will be scheduled when available again.  Please follow up with Dr. [name] in 3 months. This is scheduled for July 28th at 10:20am. The parking code for July is 9009.

## 2019-01-22 ENCOUNTER — Other Ambulatory Visit (HOSPITAL_COMMUNITY): Payer: Managed Care, Other (non HMO)

## 2019-01-22 ENCOUNTER — Encounter (HOSPITAL_COMMUNITY): Payer: Managed Care, Other (non HMO) | Admitting: Cardiology

## 2019-01-28 ENCOUNTER — Ambulatory Visit: Payer: Managed Care, Other (non HMO) | Admitting: Internal Medicine

## 2019-01-29 ENCOUNTER — Other Ambulatory Visit (HOSPITAL_COMMUNITY): Payer: Managed Care, Other (non HMO)

## 2019-02-03 ENCOUNTER — Encounter (HOSPITAL_COMMUNITY): Payer: Managed Care, Other (non HMO)

## 2019-02-09 ENCOUNTER — Telehealth: Payer: Self-pay | Admitting: Internal Medicine

## 2019-02-09 NOTE — Telephone Encounter (Signed)
Patient calling  Wanted to make both offices aware that there was something wrong with the pacemaker monitor He has been sent a new one but it may take a few business days to arrive  Any questions or concerns, please call

## 2019-02-09 NOTE — Telephone Encounter (Signed)
Thanks for the update.  I will defer this to EP/device clinic.  [name], MD [facility] Pager: [phone]

## 2019-02-09 NOTE — Telephone Encounter (Signed)
Noted  

## 2019-02-11 ENCOUNTER — Telehealth (HOSPITAL_COMMUNITY): Payer: Self-pay

## 2019-02-11 NOTE — Telephone Encounter (Signed)

## 2019-02-12 ENCOUNTER — Ambulatory Visit (HOSPITAL_COMMUNITY)
Admission: RE | Admit: 2019-02-12 | Discharge: 2019-02-12 | Disposition: A | Discharge status: Home / Self Care | Payer: Managed Care, Other (non HMO) | Source: Ambulatory Visit | Attending: Internal Medicine | Admitting: Internal Medicine

## 2019-02-12 ENCOUNTER — Other Ambulatory Visit: Payer: Self-pay

## 2019-02-12 DIAGNOSIS — I5042 Chronic combined systolic (congestive) and diastolic (congestive) heart failure: Secondary | ICD-10-CM | POA: Diagnosis not present

## 2019-02-12 LAB — BASIC METABOLIC PANEL
Anion gap: 9 (ref 5–15)
BUN: 15 mg/dL (ref 6–20)
CO2: 26 mmol/L (ref 22–32)
Calcium: 9.2 mg/dL (ref 8.9–10.3)
Chloride: 103 mmol/L (ref 98–111)
Creatinine, Ser: 1.02 mg/dL (ref 0.61–1.24)
GFR calc Af Amer: >60 mL/min (ref >60)
GFR calc non Af Amer: >60 mL/min (ref >60)
Glucose, Bld: 224 mg/dL — ABNORMAL HIGH (ref 70–99)
Potassium: 4.2 mmol/L (ref 3.5–5.1)
Sodium: 138 mmol/L (ref 135–145)

## 2019-02-26 ENCOUNTER — Encounter (HOSPITAL_COMMUNITY): Payer: Managed Care, Other (non HMO)

## 2019-03-10 ENCOUNTER — Telehealth: Payer: Self-pay | Admitting: Internal Medicine

## 2019-03-10 NOTE — Telephone Encounter (Signed)
Received records request [employer], forwarded to [facility] for processing. Faxed and scanned to [facility]

## 2019-03-18 ENCOUNTER — Telehealth: Payer: Self-pay | Admitting: *Deleted

## 2019-03-18 ENCOUNTER — Ambulatory Visit: Payer: Self-pay | Admitting: Family Medicine

## 2019-03-18 DIAGNOSIS — Z20822 Contact with and (suspected) exposure to covid-19: Secondary | ICD-10-CM

## 2019-03-18 DIAGNOSIS — I428 Other cardiomyopathies: Secondary | ICD-10-CM

## 2019-03-18 DIAGNOSIS — I5022 Chronic systolic (congestive) heart failure: Secondary | ICD-10-CM

## 2019-03-18 DIAGNOSIS — Z20828 Contact with and (suspected) exposure to other viral communicable diseases: Secondary | ICD-10-CM

## 2019-03-18 NOTE — Telephone Encounter (Signed)
I spoke with pt; pt was with pastor on 03/15/19. Pt was told that pastor tested + for covid today. No fever,chills,S/T,cough,muscle pain,SOB,diarrhea, no loss of taste/smell. Diarrhea may be due to metformin and started H/A on 03/17/19. No H/A today. No travel.  Pt does have wt scales but does not have thermometer or BP cuff.pt scheduled virtual visit 03/19/19 at 9:40 with Dr Lorelei Pont. Pt concerned about getting covid testing done.

## 2019-03-18 NOTE — Telephone Encounter (Signed)
Pt. Reports he had a COVID positive exposure. His pastor has tested positive and he was helping him with sound equipment for out door services. Pt. States he has a headache and has "a lot of heart issues so I want to talk to Dr. [name]." Unable to reach [facility] by phone. Please advise pt.  Answer Assessment - Initial Assessment Questions 1. CLOSE CONTACT: "Who is the person with the confirmed or suspected COVID-19 infection that you were exposed to?"     Pastor 2. PLACE of CONTACT: "Where were you when you were exposed to COVID-19?" (e.g., home, school, medical waiting room; which city?)     Church 3. TYPE of CONTACT: "How much contact was there?" (e.g., sitting next to, live in same house, work in same office, same building)     Outside 4. DURATION of CONTACT: "How long were you in contact with the COVID-19 patient?" (e.g., a few seconds, passed by person, a few minutes, live with the patient)     Close contact 5. DATE of CONTACT: "When did you have contact with a COVID-19 patient?" (e.g., how many days ago)     Last Sunday 6. TRAVEL: "Have you traveled out of the country recently?" If so, "When and where?"     * Also ask about out-of-state travel, since the CDC has identified some high-risk cities for community spread in the [place].     * Note: Travel becomes less relevant if there is widespread community transmission where the patient lives.     No 7. COMMUNITY SPREAD: "Are there lots of cases of COVID-19 (community spread) where you live?" (See public health department website, if unsure)       Yes 8. SYMPTOMS: "Do you have any symptoms?" (e.g., fever, cough, breathing difficulty)     Headache 9. PREGNANCY OR POSTPARTUM: "Is there any chance you are pregnant?" "When was your last menstrual period?" "Did you deliver in the last 2 weeks?"     n/a 10. HIGH RISK: "Do you have any heart or lung problems? Do you have a weak immune system?" (e.g., CHF, COPD, asthma, HIV positive, chemotherapy, renal  failure, diabetes mellitus, sickle cell anemia)       Heart, diabetic  Protocols used: CORONAVIRUS (COVID-19) EXPOSURE-A-AH

## 2019-03-18 NOTE — Telephone Encounter (Signed)
Case reviewed. Advanced heart failure patient.  I am initiating testing orders now.  I would like to speak to him in the AM as scheduled.

## 2019-03-18 NOTE — Telephone Encounter (Signed)
-----   Message from Owens Loffler, MD sent at 03/18/2019  3:47 PM EDT ----- Regarding: high risk patient Covid-19 testing  [facility] Community Test Pool: Patient Name: Philip Ponce Date of Birth: 1971-06-10 Reason for Covid-19 test: Known + exposure in a patient with severe CHF, followed by advanced heart failure clinic Insurance information: [employer] Eligibility:

## 2019-03-18 NOTE — Telephone Encounter (Signed)
Pt called and scheduled for COVID-19 testing on 03/19/19 at [facility] site. Pt advised to wear a mask and remain in car at the time of appt. Pt verbalized understanding.

## 2019-03-19 ENCOUNTER — Other Ambulatory Visit: Payer: Managed Care, Other (non HMO)

## 2019-03-19 ENCOUNTER — Encounter: Payer: Self-pay | Admitting: Family Medicine

## 2019-03-19 ENCOUNTER — Ambulatory Visit (INDEPENDENT_AMBULATORY_CARE_PROVIDER_SITE_OTHER): Payer: Managed Care, Other (non HMO) | Admitting: Family Medicine

## 2019-03-19 VITALS — Temp 98.0°F | Ht 73.0 in | Wt 186.2 lb

## 2019-03-19 DIAGNOSIS — Z20822 Contact with and (suspected) exposure to covid-19: Secondary | ICD-10-CM

## 2019-03-19 DIAGNOSIS — Z20828 Contact with and (suspected) exposure to other viral communicable diseases: Secondary | ICD-10-CM

## 2019-03-19 DIAGNOSIS — E119 Type 2 diabetes mellitus without complications: Secondary | ICD-10-CM | POA: Diagnosis not present

## 2019-03-19 DIAGNOSIS — I5022 Chronic systolic (congestive) heart failure: Secondary | ICD-10-CM | POA: Diagnosis not present

## 2019-03-19 DIAGNOSIS — I428 Other cardiomyopathies: Secondary | ICD-10-CM | POA: Diagnosis not present

## 2019-03-19 NOTE — Progress Notes (Signed)
     Lupe Handley T. Hyun Marsalis, MD Primary Care and Sports Medicine [facility] at [facility] [place], [address] Phone: [phone]  FAX: [phone]  TRAQUAN DUARTE - 48 y.o. male  MRN [id number]  Date of Birth: May 21, 1971  Visit Date: 03/19/2019  PCP: [name], MD  Referred by: [name], MD Chief Complaint  Patient presents with  . Exposure to Covid    No symptoms   Virtual Visit via Video Note:  I connected with  Philip Ponce on 03/19/2019  9:40 AM EDT by a video enabled telemedicine application and verified that I am speaking with the correct person using two identifiers.   Location patient: home computer, tablet, or smartphone Location provider: work or home office Consent: Verbal consent directly obtained from Philip Ponce. Persons participating in the virtual visit: patient, provider  I discussed the limitations of evaluation and management by telemedicine and the availability of in person appointments. The patient expressed understanding and agreed to proceed.  History of Present Illness:  He had a known exposure to Covid-19, and when I was alerted to this, I arranging testing for this morning.  He will be followed by the RN's as well.  Very high risk with advanced CHF, DM and other risk factors.   Review of Systems as above: See pertinent positives and pertinent negatives per HPI No acute distress verbally  Past Medical History, Surgical History, Social History, Family History, Problem List, Medications, and Allergies have been reviewed and updated if relevant.   Observations/Objective/Exam:  An attempt was made to discern vital signs over the phone and per patient if applicable and possible.   General:    Alert, Oriented, appears well and in no acute distress HEENT:     Atraumatic, conjunctiva clear, no obvious abnormalities on inspection of external nose and ears.  Neck:    Normal movements of the  head and neck Pulmonary:     On inspection no signs of respiratory distress, breathing rate appears normal, no obvious gross SOB, gasping or wheezing Cardiovascular:    No obvious cyanosis Musculoskeletal:    Moves all visible extremities without noticeable abnormality Psych / Neurological:     Pleasant and cooperative, no obvious depression or anxiety, speech and thought processing grossly intact  Assessment and Plan:    ICD-10-CM   1. Close Exposure to Covid-19 Virus  Z20.828   2. Chronic systolic heart failure (HCC)  I50.22   3. Non-ischemic cardiomyopathy ([place])  I42.8   4. Diabetes mellitus with no complication (HCC)  Z16.9    Multiple risk factors, testing today, quarantine until results are back.  I discussed the assessment and treatment plan with the patient. The patient was provided an opportunity to ask questions and all were answered. The patient agreed with the plan and demonstrated an understanding of the instructions.   The patient was advised to call back or seek an in-person evaluation if the symptoms worsen or if the condition fails to improve as anticipated.  Follow-up: prn unless noted otherwise below No follow-ups on file.  No orders of the defined types were placed in this encounter.  No orders of the defined types were placed in this encounter.   Signed,  Maud Deed. Lachlyn Vanderstelt, MD

## 2019-03-20 ENCOUNTER — Ambulatory Visit: Payer: Managed Care, Other (non HMO) | Admitting: Family Medicine

## 2019-03-20 ENCOUNTER — Encounter (INDEPENDENT_AMBULATORY_CARE_PROVIDER_SITE_OTHER): Payer: Self-pay

## 2019-03-23 LAB — NOVEL CORONAVIRUS, NAA: SARS-CoV-2, NAA: NOT DETECTED

## 2019-03-24 NOTE — Telephone Encounter (Signed)
Received forms from [facility] for physician to complete, placed in nursed box

## 2019-03-25 NOTE — Telephone Encounter (Signed)
Forms complete sending to [employer] via interoffice mail

## 2019-03-31 ENCOUNTER — Ambulatory Visit (INDEPENDENT_AMBULATORY_CARE_PROVIDER_SITE_OTHER): Payer: Managed Care, Other (non HMO) | Admitting: *Deleted

## 2019-03-31 DIAGNOSIS — I428 Other cardiomyopathies: Secondary | ICD-10-CM

## 2019-03-31 DIAGNOSIS — I5042 Chronic combined systolic (congestive) and diastolic (congestive) heart failure: Secondary | ICD-10-CM

## 2019-03-31 LAB — CUP PACEART REMOTE DEVICE CHECK
Battery Remaining Longevity: 88 mo
Battery Voltage: 2.98 V
Brady Statistic AP VP Percent: 0.04 %
Brady Statistic AP VS Percent: 0.01 %
Brady Statistic AS VP Percent: 98.7 %
Brady Statistic AS VS Percent: 1.25 %
Brady Statistic RA Percent Paced: 0.05 %
Brady Statistic RV Percent Paced: 66.88 %
Date Time Interrogation Session: 20200707062602
HighPow Impedance: 55 Ohm
Implantable Lead Implant Date: 20181003
Implantable Lead Implant Date: 20181003
Implantable Lead Implant Date: 20181003
Implantable Lead Location: 753858
Implantable Lead Location: 753859
Implantable Lead Location: 753860
Implantable Lead Model: 4398
Implantable Lead Model: 5076
Implantable Pulse Generator Implant Date: 20181003
Lead Channel Impedance Value: 176 Ohm
Lead Channel Impedance Value: 182.4 Ohm
Lead Channel Impedance Value: 185.366
Lead Channel Impedance Value: 218.087
Lead Channel Impedance Value: 222.34 Ohm
Lead Channel Impedance Value: 285 Ohm
Lead Channel Impedance Value: 304 Ohm
Lead Channel Impedance Value: 361 Ohm
Lead Channel Impedance Value: 418 Ohm
Lead Channel Impedance Value: 456 Ohm
Lead Channel Impedance Value: 456 Ohm
Lead Channel Impedance Value: 475 Ohm
Lead Channel Impedance Value: 551 Ohm
Lead Channel Impedance Value: 665 Ohm
Lead Channel Impedance Value: 665 Ohm
Lead Channel Impedance Value: 703 Ohm
Lead Channel Impedance Value: 760 Ohm
Lead Channel Impedance Value: 779 Ohm
Lead Channel Pacing Threshold Amplitude: 0.5 V
Lead Channel Pacing Threshold Amplitude: 0.75 V
Lead Channel Pacing Threshold Amplitude: 1.125 V
Lead Channel Pacing Threshold Pulse Width: 0.4 ms
Lead Channel Pacing Threshold Pulse Width: 0.4 ms
Lead Channel Pacing Threshold Pulse Width: 0.4 ms
Lead Channel Sensing Intrinsic Amplitude: 2.5 mV
Lead Channel Sensing Intrinsic Amplitude: 2.5 mV
Lead Channel Sensing Intrinsic Amplitude: 7.875 mV
Lead Channel Sensing Intrinsic Amplitude: 7.875 mV
Lead Channel Setting Pacing Amplitude: 1.75 V
Lead Channel Setting Pacing Amplitude: 2 V
Lead Channel Setting Pacing Amplitude: 2.5 V
Lead Channel Setting Pacing Pulse Width: 0.4 ms
Lead Channel Setting Pacing Pulse Width: 0.4 ms
Lead Channel Setting Sensing Sensitivity: 0.3 mV

## 2019-04-10 ENCOUNTER — Other Ambulatory Visit: Payer: Managed Care, Other (non HMO)

## 2019-04-10 ENCOUNTER — Other Ambulatory Visit: Payer: Self-pay

## 2019-04-10 ENCOUNTER — Telehealth: Payer: Self-pay | Admitting: Family Medicine

## 2019-04-10 ENCOUNTER — Other Ambulatory Visit (INDEPENDENT_AMBULATORY_CARE_PROVIDER_SITE_OTHER): Payer: Managed Care, Other (non HMO)

## 2019-04-10 DIAGNOSIS — E119 Type 2 diabetes mellitus without complications: Secondary | ICD-10-CM

## 2019-04-10 DIAGNOSIS — Z794 Long term (current) use of insulin: Secondary | ICD-10-CM

## 2019-04-10 DIAGNOSIS — D509 Iron deficiency anemia, unspecified: Secondary | ICD-10-CM

## 2019-04-10 DIAGNOSIS — E7800 Pure hypercholesterolemia, unspecified: Secondary | ICD-10-CM

## 2019-04-10 LAB — LIPID PANEL
Cholesterol: 137 mg/dL (ref 0–200)
HDL: 64.8 mg/dL (ref >39.00)
LDL Cholesterol: 51 mg/dL (ref 0–99)
NonHDL: 71.85
Total CHOL/HDL Ratio: 2
Triglycerides: 103 mg/dL (ref 0.0–149.0)
VLDL: 20.6 mg/dL (ref 0.0–40.0)

## 2019-04-10 LAB — COMPREHENSIVE METABOLIC PANEL
ALT: 12 U/L (ref 0–53)
AST: 13 U/L (ref 0–37)
Albumin: 3.9 g/dL (ref 3.5–5.2)
Alkaline Phosphatase: 61 U/L (ref 39–117)
BUN: 18 mg/dL (ref 6–23)
CO2: 33 mEq/L — ABNORMAL HIGH (ref 19–32)
Calcium: 9 mg/dL (ref 8.4–10.5)
Chloride: 100 mEq/L (ref 96–112)
Creatinine, Ser: 1.02 mg/dL (ref 0.40–1.50)
GFR: 93.58 mL/min (ref >60.00)
Glucose, Bld: 279 mg/dL — ABNORMAL HIGH (ref 70–99)
Potassium: 5 mEq/L (ref 3.5–5.1)
Sodium: 135 mEq/L (ref 135–145)
Total Bilirubin: 0.4 mg/dL (ref 0.2–1.2)
Total Protein: 7 g/dL (ref 6.0–8.3)

## 2019-04-10 LAB — HEMOGLOBIN A1C: Hgb A1c MFr Bld: 8.4 % — ABNORMAL HIGH (ref 4.6–6.5)

## 2019-04-10 NOTE — Telephone Encounter (Signed)
-----   Message from Cloyd Stagers, RT sent at 04/01/2019  4:14 PM EDT ----- Regarding: Lab Orders for Friday 7.17.2020 Please place lab orders for Friday 7.17.2020., 3 month f/u DM on Thursday 7.23.2020 Thank you, Dyke Maes RT(R)

## 2019-04-11 ENCOUNTER — Encounter: Payer: Self-pay | Admitting: Cardiology

## 2019-04-11 NOTE — Progress Notes (Signed)
Remote ICD transmission.   

## 2019-04-13 ENCOUNTER — Inpatient Hospital Stay: Payer: Managed Care, Other (non HMO) | Attending: Oncology | Admitting: Oncology

## 2019-04-13 ENCOUNTER — Inpatient Hospital Stay: Payer: Managed Care, Other (non HMO)

## 2019-04-16 ENCOUNTER — Encounter: Payer: Self-pay | Admitting: Family Medicine

## 2019-04-16 ENCOUNTER — Ambulatory Visit: Payer: Managed Care, Other (non HMO) | Admitting: Family Medicine

## 2019-04-16 ENCOUNTER — Ambulatory Visit (INDEPENDENT_AMBULATORY_CARE_PROVIDER_SITE_OTHER): Payer: Managed Care, Other (non HMO) | Admitting: Family Medicine

## 2019-04-16 ENCOUNTER — Other Ambulatory Visit: Payer: Self-pay

## 2019-04-16 DIAGNOSIS — Z794 Long term (current) use of insulin: Secondary | ICD-10-CM | POA: Diagnosis not present

## 2019-04-16 DIAGNOSIS — E119 Type 2 diabetes mellitus without complications: Secondary | ICD-10-CM

## 2019-04-16 DIAGNOSIS — E7800 Pure hypercholesterolemia, unspecified: Secondary | ICD-10-CM

## 2019-04-16 DIAGNOSIS — I10 Essential (primary) hypertension: Secondary | ICD-10-CM

## 2019-04-16 LAB — HM DIABETES FOOT EXAM

## 2019-04-16 MED ORDER — BASAGLAR KWIKPEN 100 UNIT/ML ~~LOC~~ SOPN: 20.0000 [IU] | PEN_INJECTOR | Freq: Every day | SUBCUTANEOUS | 11 refills | Status: DC

## 2019-04-16 MED ORDER — GLUCOSE BLOOD VI STRP: ORAL_STRIP | 1 refills | Status: DC

## 2019-04-16 MED ORDER — METFORMIN HCL ER 500 MG PO TB24: 500.0000 mg | ORAL_TABLET | Freq: Every day | ORAL | 11 refills | Status: DC

## 2019-04-16 NOTE — Assessment & Plan Note (Signed)
LDL at goal 

## 2019-04-16 NOTE — Progress Notes (Signed)
Chief Complaint  Patient presents with  . Diabetes    Here for 3 mo f/u.    History of Present Illness: HPI   48 year old male with CHF, DM, HTN anemia presents for 3 month follow up DM.  Diabetes:   Inadequate control  On balsalgar 15 units and low dose metformin( high dose causes SE)  He states he had SE to a new med Dr. [name] prescribed... will call with results. Lab Results  Component Value Date   HGBA1C 8.4 (H) 04/10/2019  Using medications without difficulties: Hypoglycemic episodes: Hyperglycemic episodes:none Feet problems: Blood Sugars averaging:? eye exam within last year: not checking He has started walking a mile a day every day.  He has been feeling tired. Poor diet. Has gained  Wt Readings from Last 3 Encounters:  04/16/19 186 lb 8 oz (84.6 kg)  03/19/19 186 lb 4 oz (84.5 kg)  01/15/19 192 lb (87.1 kg)     HTN  At goal   Elevated Cholesterol:  At goal < 100 Lab Results  Component Value Date   CHOL 137 04/10/2019   HDL 64.80 04/10/2019   LDLCALC 51 04/10/2019   TRIG 103.0 04/10/2019   CHOLHDL 2 04/10/2019    Using medications without problems: Muscle aches:  Diet compliance: Exercise: Other complaints:  COVID 19 screen No recent travel or known exposure to [place] The patient denies respiratory symptoms of COVID 19 at this time.  The importance of social distancing was discussed today.   Review of Systems  Constitutional: Negative for chills and fever.  HENT: Negative for congestion and ear pain.   Eyes: Negative for pain and redness.  Respiratory: Negative for cough and shortness of breath.   Cardiovascular: Negative for chest pain, palpitations and leg swelling.  Gastrointestinal: Negative for abdominal pain, blood in stool, constipation, diarrhea, nausea and vomiting.  Genitourinary: Negative for dysuria.  Musculoskeletal: Negative for falls and myalgias.  Skin: Negative for rash.  Neurological: Negative for dizziness.   Psychiatric/Behavioral: Negative for depression. The patient is not nervous/anxious.       Past Medical History:  Diagnosis Date  . Acute blood loss anemia 11/20/2016  . AICD (automatic cardioverter/defibrillator) present    a. 06/2017 s/p MDT DTMA 1QQ [place] MRI Quad CRT-D SureScan (ser # [id number]).  . Chronic combined systolic (congestive) and diastolic (congestive) heart failure ([place])    a. 11/2016 Echo: EF 20-25%, glob HK, antsept, ant, apical HK, Gr1 DD, mild MR, mildly dil LA, nl RV fxn; b. 05/2017 Echo: Ef 20-25%, Gr2 DD, prominent apical trabeculations; c. 12/2017 Echo: EF 30-35%, Gr1 DD, glob HK.  . Diabetes mellitus ([place])   . Essential hypertension   . Family history of adverse reaction to anesthesia    mom had a hard time waking up  . GERD (gastroesophageal reflux disease)   . NICM (nonischemic cardiomyopathy) ([place])    a. 11/2016 Cath: nl cors, EF 25-30%; b. 11/2016 Echo: EF 20-25%; c. 05/2017 Echo: EF 20-25%; d. 12/2017 Echo: EF 30-35%, Gr1 DD.  Marland Kitchen Slipped intervertebral disc    L4 L5  . STEMI (ST elevation myocardial infarction) ([place])    a. 11/2016 ST elevation -->Nl cors on cath.    reports that he has never smoked. He has never used smokeless tobacco. He reports current alcohol use of about 4.0 standard drinks of alcohol per week. He reports that he does not use drugs.   Current Outpatient Medications:  .  calcium carbonate (OS-CAL) 600 MG TABS tablet, Take  1,200 mg by mouth daily with breakfast. , Disp: , Rfl:  .  carvedilol (COREG) 12.5 MG tablet, TAKE 1 TABLET BY MOUTH TWICE DAILY, Disp: 180 tablet, Rfl: 3 .  digoxin (LANOXIN) 0.125 MG tablet, Take 1 tablet (0.125 mg total) by mouth daily., Disp: 90 tablet, Rfl: 3 .  glucose blood (ONE TOUCH ULTRA TEST) test strip, Check blood sugar once daily and as instructed. Dx E11.9, Disp: 100 each, Rfl: 1 .  ibuprofen (ADVIL,MOTRIN) 600 MG tablet, Take 600 mg by mouth every 6 (six) hours as needed. for pain, Disp: , Rfl: 0 .  Insulin  Glargine (BASAGLAR KWIKPEN) 100 UNIT/ML SOPN, Inject 0.15 mLs (15 Units total) into the skin daily., Disp: 3 mL, Rfl: 11 .  metFORMIN (GLUCOPHAGE) 500 MG tablet, Take 500 mg by mouth daily with breakfast., Disp: , Rfl:  .  Multiple Vitamins-Minerals (ADULT GUMMY PO), Take 2 tablets by mouth daily., Disp: , Rfl:  .  Omega-3 Fatty Acids (FISH OIL PO), Take 800 mg by mouth daily., Disp: , Rfl:  .  omeprazole (PRILOSEC) 40 MG capsule, Take 40 mg by mouth daily as needed (indigestion). , Disp: , Rfl:  .  OVER THE COUNTER MEDICATION, Man-T One tablet by mouth twice a day., Disp: , Rfl:  .  sacubitril-valsartan (ENTRESTO) 97-103 MG, Take 1 tablet by mouth 2 (two) times daily., Disp: 180 tablet, Rfl: 3 .  spironolactone (ALDACTONE) 25 MG tablet, Take 1 tablet (25 mg total) by mouth daily., Disp: 90 tablet, Rfl: 3   Observations/Objective: Blood pressure 124/82, pulse 77, temperature 98.2 F (36.8 C), temperature source Temporal, height 6\' 1"  (1.854 m), weight 186 lb 8 oz (84.6 kg), SpO2 98 %.  Physical Exam Constitutional:      Appearance: He is well-developed.  HENT:     Head: Normocephalic.     Right Ear: Hearing normal.     Left Ear: Hearing normal.     Nose: Nose normal.  Neck:     Thyroid: No thyroid mass or thyromegaly.     Vascular: No carotid bruit.     Trachea: Trachea normal.  Cardiovascular:     Rate and Rhythm: Normal rate and regular rhythm.     Pulses: Normal pulses.     Heart sounds: Heart sounds not distant. No murmur. No friction rub. No gallop.      Comments: No peripheral edema Pulmonary:     Effort: Pulmonary effort is normal. No respiratory distress.     Breath sounds: Normal breath sounds.  Skin:    General: Skin is warm and dry.     Findings: No rash.  Psychiatric:        Speech: Speech normal.        Behavior: Behavior normal.        Thought Content: Thought content normal.     Diabetic foot exam: Normal inspection No skin breakdown No calluses  Normal DP  pulses Normal sensation to light touch and monofilament Nails normal  Assessment and Plan      Philip Lofts, MD

## 2019-04-16 NOTE — Patient Instructions (Addendum)
Work on low [employer], stop snacking.  Decrease bread, pasta, potatos.  Keep up with exercise.  Start rechecking blood sugar daily.   Increase balsalgar 20 Units daily.

## 2019-04-16 NOTE — Assessment & Plan Note (Signed)
Well controlled. Continue current medication.  

## 2019-04-16 NOTE — Assessment & Plan Note (Signed)
Poor control. Per pt did not tolerate ? SGLP1i prescribed by D.r [name].Marland KitchenMarland KitchenI do not see this in chart.. he will call with med name.  For now.Royetta Crochet to long acting metformin and increase balsalgar to 20 units, get back on track with diet, weight loss and continue exercise.

## 2019-04-20 ENCOUNTER — Other Ambulatory Visit: Payer: Self-pay

## 2019-04-21 ENCOUNTER — Inpatient Hospital Stay: Payer: Managed Care, Other (non HMO) | Attending: Oncology

## 2019-04-21 ENCOUNTER — Other Ambulatory Visit: Payer: Self-pay

## 2019-04-21 ENCOUNTER — Encounter (HOSPITAL_COMMUNITY): Payer: Managed Care, Other (non HMO) | Admitting: Cardiology

## 2019-04-21 ENCOUNTER — Inpatient Hospital Stay (HOSPITAL_BASED_OUTPATIENT_CLINIC_OR_DEPARTMENT_OTHER): Payer: Managed Care, Other (non HMO) | Admitting: Oncology

## 2019-04-21 VITALS — BP 129/78 | HR 84 | Temp 96.6°F | Resp 18 | Wt 190.4 lb

## 2019-04-21 DIAGNOSIS — D509 Iron deficiency anemia, unspecified: Secondary | ICD-10-CM

## 2019-04-21 DIAGNOSIS — I428 Other cardiomyopathies: Secondary | ICD-10-CM | POA: Diagnosis not present

## 2019-04-21 DIAGNOSIS — E538 Deficiency of other specified B group vitamins: Secondary | ICD-10-CM

## 2019-04-21 DIAGNOSIS — Z9884 Bariatric surgery status: Secondary | ICD-10-CM | POA: Diagnosis not present

## 2019-04-21 LAB — CBC WITH DIFFERENTIAL/PLATELET
Abs Immature Granulocytes: 0.01 10*3/uL (ref 0.00–0.07)
Basophils Absolute: 0 10*3/uL (ref 0.0–0.1)
Basophils Relative: 1 %
Eosinophils Absolute: 0.1 10*3/uL (ref 0.0–0.5)
Eosinophils Relative: 1 %
HCT: 39.8 % (ref 39.0–52.0)
Hemoglobin: 12.5 g/dL — ABNORMAL LOW (ref 13.0–17.0)
Immature Granulocytes: 0 %
Lymphocytes Relative: 29 %
Lymphs Abs: 1.6 10*3/uL (ref 0.7–4.0)
MCH: 27.7 pg (ref 26.0–34.0)
MCHC: 31.4 g/dL (ref 30.0–36.0)
MCV: 88.1 fL (ref 80.0–100.0)
Monocytes Absolute: 0.5 10*3/uL (ref 0.1–1.0)
Monocytes Relative: 10 %
Neutro Abs: 3.4 10*3/uL (ref 1.7–7.7)
Neutrophils Relative %: 59 %
Platelets: 194 10*3/uL (ref 150–400)
RBC: 4.52 MIL/uL (ref 4.22–5.81)
RDW: 12.1 % (ref 11.5–15.5)
WBC: 5.6 10*3/uL (ref 4.0–10.5)
nRBC: 0 % (ref 0.0–0.2)

## 2019-04-21 LAB — IRON AND TIBC
Iron: 68 ug/dL (ref 45–182)
Saturation Ratios: 25 % (ref 17.9–39.5)
TIBC: 277 ug/dL (ref 250–450)
UIBC: 209 ug/dL

## 2019-04-21 LAB — FERRITIN: Ferritin: 129 ng/mL (ref 24–336)

## 2019-04-21 LAB — VITAMIN B12: Vitamin B-12: 533 pg/mL (ref 180–914)

## 2019-04-21 NOTE — Progress Notes (Signed)
Pt here for follow up. Denies any concerns.  

## 2019-04-23 ENCOUNTER — Encounter: Payer: Self-pay | Admitting: Oncology

## 2019-04-23 NOTE — Progress Notes (Signed)
Hematology/Oncology Consult note [facility]  [phone] [phone]  Patient Care Team: [name], MD as PCP - General End, Harrell Gave, MD as PCP - Cardiology (Cardiology) Alisa Graff, FNP as Nurse Practitioner (Family Medicine)   Name of the patient: Philip Ponce  [id number]  [birth year]   Date of visit: 04/23/19  Diagnosis- 1.  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  B12 deficiency   Chief complaint/ Reason for visit-routine follow-up of iron deficiency anemia  Heme/Onc history: patient is a 48 year old male who was seen by [name] for symptoms of bright red blood per rectum. Patient also had some epigastric pain in the past and was on NSAIDs at that time which was stopped. Patient's father died of colon cancer. Patient has a history of gastric bypass about 3 years ago. Last CBC from 12/05/2016 showed white count of 4.9, H&H of 8.1/24.1 and a platelet count of 349. Ferritin was low at 8. Serum iron was low at 15 and iron saturation was low at 5%. TIBC was normal at 295. H pylori stool antigen was negative. B12 folate and TSH were within normal limits. EGD on 12/03/2016 showed: Esophagus was normal. Evidence of gastric bypass. Otherwise normal findings of gastric bypass. Biopsies were taken for evaluation of celiac disease. Colonoscopy was normal. Patient also has a history of cardiomyopathy with an EF of 35% and during his admission for iron deficiency anemia and he was also found to have left bundle branch block and underwent cardiac catheterization which did not show any abnormalities. Patient has also had episodes of melena in February 2018 for which she was admitted to [facility] and was given PPI but did not undergo any EGD or colonoscopy at that time. Capsule study has also been completed.  pateint has received 2 doses of feraheme so far  Patient has baseline nonischemic cardiomyopathy and his recent EF from  April 2019 was 30 to 35%   Interval history-overall he is doing well and denies any complaints at this time.  Denies any blood in his stool or urine.  Energy levels are good.  Denies any leg swelling or shortness of breath  ECOG PS- 1 Pain scale- 0   Review of systems- Review of Systems  Constitutional: Negative for chills, fever, malaise/fatigue and weight loss.  HENT: Negative for congestion, ear discharge and nosebleeds.   Eyes: Negative for blurred vision.  Respiratory: Negative for cough, hemoptysis, sputum production, shortness of breath and wheezing.   Cardiovascular: Negative for chest pain, palpitations, orthopnea and claudication.  Gastrointestinal: Negative for abdominal pain, blood in stool, constipation, diarrhea, heartburn, melena, nausea and vomiting.  Genitourinary: Negative for dysuria, flank pain, frequency, hematuria and urgency.  Musculoskeletal: Negative for back pain, joint pain and myalgias.  Skin: Negative for rash.  Neurological: Negative for dizziness, tingling, focal weakness, seizures, weakness and headaches.  Endo/Heme/Allergies: Does not bruise/bleed easily.  Psychiatric/Behavioral: Negative for depression and suicidal ideas. The patient does not have insomnia.        Allergies  Allergen Reactions  . Mobic [Meloxicam] Other (See Comments)    Ulcers in stomach eruption   . Tramadol     Hard to breath  . Diclofenac Palpitations and Other (See Comments)    Fast heart beat, CP,SOB and weakness on one side.     Past Medical History:  Diagnosis Date  . Acute blood loss anemia 11/20/2016  . AICD (automatic cardioverter/defibrillator) present    a. 06/2017 s/p MDT  DTMA 1QQ [place] MRI Quad CRT-D SureScan (ser # [id number]).  . Chronic combined systolic (congestive) and diastolic (congestive) heart failure ([place])    a. 11/2016 Echo: EF 20-25%, glob HK, antsept, ant, apical HK, Gr1 DD, mild MR, mildly dil LA, nl RV fxn; b. 05/2017 Echo: Ef 20-25%, Gr2 DD,  prominent apical trabeculations; c. 12/2017 Echo: EF 30-35%, Gr1 DD, glob HK.  . Diabetes mellitus ([place])   . Essential hypertension   . Family history of adverse reaction to anesthesia    mom had a hard time waking up  . GERD (gastroesophageal reflux disease)   . NICM (nonischemic cardiomyopathy) ([place])    a. 11/2016 Cath: nl cors, EF 25-30%; b. 11/2016 Echo: EF 20-25%; c. 05/2017 Echo: EF 20-25%; d. 12/2017 Echo: EF 30-35%, Gr1 DD.  Marland Kitchen Slipped intervertebral disc    L4 L5  . STEMI (ST elevation myocardial infarction) ([place])    a. 11/2016 ST elevation -->Nl cors on cath.     Past Surgical History:  Procedure Laterality Date  . BIV ICD INSERTION CRT-D  06/26/2017  . BIV ICD INSERTION CRT-D N/A 06/26/2017   Procedure: BIV ICD INSERTION CRT-D;  Surgeon: [name], MD;  Location: [facility];  Service: Cardiovascular;  Laterality: N/A;  . CERVICAL DISC ARTHROPLASTY N/A 07/24/2017   Procedure: CERVICAL ANTERIOR [place] ARTHROPLASTY C5-C7;  Surgeon: [name], MD;  Location: [facility];  Service: Neurosurgery;  Laterality: N/A;  . COLONOSCOPY WITH PROPOFOL N/A 12/03/2016   Procedure: COLONOSCOPY WITH PROPOFOL;  Surgeon: [name], MD;  Location: [facility] ENDOS[facility];  Service: Endoscopy;  Laterality: N/A;  . ESOPHAGOGASTRODUODENOSCOPY (EGD) WITH PROPOFOL N/A 12/03/2016   Procedure: ESOPHAGOGASTRODUODENOSCOPY (EGD) WITH PROPOFOL;  Surgeon: [name], MD;  Location: [facility];  Service: Endoscopy;  Laterality: N/A;  . FINGER SURGERY Right 2012   index finger  . GASTRIC BYPASS    . GIVENS CAPSULE STUDY N/A 01/07/2017   Procedure: GIVENS CAPSULE STUDY;  Surgeon: [name], MD;  Location: [facility];  Service: Endoscopy;  Laterality: N/A;  . LEFT HEART CATH AND CORONARY ANGIOGRAPHY N/A 12/04/2016   Procedure: Left Heart Cath and Coronary Angiography;  Surgeon: [name], MD;  Location: [facility];  Service: Cardiovascular;  Laterality: N/A;  . SHOULDER ARTHROSCOPY  WITH OPEN ROTATOR CUFF REPAIR Left 10/04/2017   Procedure: SHOULDER ARTHROSCOPY WITH ROTATOR CUFF REPAIR, SUBACROMIAL DECOMPRESSION,OPEN BICEP TENODESIS, EXTENSIVE DEBRIDEMENT;  Surgeon: [name], MD;  Location: [facility];  Service: Orthopedics;  Laterality: Left;    Social History   Socioeconomic History  . Marital status: Married    Spouse name: Not on file  . Number of children: Not on file  . Years of education: Not on file  . Highest education level: Not on file  Occupational History  . Occupation: Teacher, early years/pre, Brewing technologist: [employer]  . Occupation: disability  Social Needs  . Financial resource strain: Not hard at all  . Food insecurity    Worry: Never true    Inability: Never true  . Transportation needs    Medical: No    Non-medical: No  Tobacco Use  . Smoking status: Never Smoker  . Smokeless tobacco: Never Used  Substance and Sexual Activity  . Alcohol use: Yes    Alcohol/week: 4.0 standard drinks    Types: 4 Glasses of wine per week    Frequency: Never  . Drug use: No  . Sexual activity: Yes  Lifestyle  . Physical activity  Days per week: 0 days    Minutes per session: Not on file  . Stress: Not at all  Relationships  . Social connections    Talks on phone: More than three times a week    Gets together: Twice a week    Attends religious service: More than 4 times per year    Active member of club or organization: No    Attends meetings of clubs or organizations: Never    Relationship status: Married  . Intimate partner violence    Fear of current or ex partner: No    Emotionally abused: No    Physically abused: No    Forced sexual activity: No  Other Topics Concern  . Not on file  Social History Narrative   Regular exercise-yes, walking one mile per day   Diet: fast food, diet soda, unsweeted tea    Family History  Problem Relation Age of Onset  . Diabetes Mother   . Hypertension Mother   . Cancer Mother         breast  . Diabetes Father   . Hypertension Father   . Cancer Father        colon  . Cancer Maternal Grandfather        prostate  . Stroke Maternal Grandfather        CVA     Current Outpatient Medications:  .  calcium carbonate (OS-CAL) 600 MG TABS tablet, Take 1,200 mg by mouth daily with breakfast. , Disp: , Rfl:  .  carvedilol (COREG) 12.5 MG tablet, TAKE 1 TABLET BY MOUTH TWICE DAILY, Disp: 180 tablet, Rfl: 3 .  glucose blood (ONE TOUCH ULTRA TEST) test strip, Check blood sugar once daily and as instructed. Dx E11.9, Disp: 100 each, Rfl: 1 .  ibuprofen (ADVIL,MOTRIN) 600 MG tablet, Take 600 mg by mouth every 6 (six) hours as needed. for pain, Disp: , Rfl: 0 .  Insulin Glargine (BASAGLAR KWIKPEN) 100 UNIT/ML SOPN, Inject 0.2 mLs (20 Units total) into the skin daily., Disp: 15 mL, Rfl: 11 .  metFORMIN (GLUCOPHAGE-XR) 500 MG 24 hr tablet, Take 1 tablet (500 mg total) by mouth daily with breakfast., Disp: 30 tablet, Rfl: 11 .  Multiple Vitamins-Minerals (ADULT GUMMY PO), Take 2 tablets by mouth daily., Disp: , Rfl:  .  Omega-3 Fatty Acids (FISH OIL PO), Take 800 mg by mouth daily., Disp: , Rfl:  .  omeprazole (PRILOSEC) 40 MG capsule, Take 40 mg by mouth daily as needed (indigestion). , Disp: , Rfl:  .  OVER THE COUNTER MEDICATION, Man-T One tablet by mouth twice a day., Disp: , Rfl:  .  sacubitril-valsartan (ENTRESTO) 97-103 MG, Take 1 tablet by mouth 2 (two) times daily., Disp: 180 tablet, Rfl: 3 .  spironolactone (ALDACTONE) 25 MG tablet, Take 1 tablet (25 mg total) by mouth daily., Disp: 90 tablet, Rfl: 3  Physical exam:  Vitals:   04/21/19 1428  BP: 129/78  Pulse: 84  Resp: 18  Temp: (!) 96.6 F (35.9 C)  TempSrc: Tympanic  SpO2: 98%  Weight: 190 lb 6.4 oz (86.4 kg)   Physical Exam Constitutional:      General: He is not in acute distress. HENT:     Head: Normocephalic and atraumatic.  Eyes:     Pupils: Pupils are equal, round, and reactive to light.  Neck:      Musculoskeletal: Normal range of motion.  Cardiovascular:     Rate and Rhythm: Normal rate and regular rhythm.  Heart sounds: Normal heart sounds.  Pulmonary:     Effort: Pulmonary effort is normal.     Breath sounds: Normal breath sounds.  Abdominal:     General: Bowel sounds are normal.     Palpations: Abdomen is soft.  Skin:    General: Skin is warm and dry.  Neurological:     Mental Status: He is alert and oriented to person, place, and time.      CMP Latest Ref Rng & Units 04/10/2019  Glucose 70 - 99 mg/dL 279(H)  BUN 6 - 23 mg/dL 18  Creatinine 0.40 - 1.50 mg/dL 1.02  Sodium 135 - 145 mEq/L 135  Potassium 3.5 - 5.1 mEq/L 5.0  Chloride 96 - 112 mEq/L 100  CO2 19 - 32 mEq/L 33(H)  Calcium 8.4 - 10.5 mg/dL 9.0  Total Protein 6.0 - 8.3 g/dL 7.0  Total Bilirubin 0.2 - 1.2 mg/dL 0.4  Alkaline Phos 39 - 117 U/L 61  AST 0 - 37 U/L 13  ALT 0 - 53 U/L 12   CBC Latest Ref Rng & Units 04/21/2019  WBC 4.0 - 10.5 K/uL 5.6  Hemoglobin 13.0 - 17.0 g/dL 12.5(L)  Hematocrit 39.0 - 52.0 % 39.8  Platelets 150 - 400 K/uL 194     Assessment and plan- Patient is a 48 y.o. male with iron deficiency anemia possibly secondary to gastric bypass  Patient's hemoglobin continues to be stable around 12.  Iron studies are normal.  He does not require any IV iron at this time.  B12 levels are also normal at 533.  Repeat CBC ferritin and iron studies in 4 months and 8 months and I will see him back in 8 months   Visit Diagnosis 1. Iron deficiency anemia, unspecified iron deficiency anemia type      Dr. Randa Evens, MD, MPH [facility] at [facility] [phone] 04/23/2019 1:46 PM

## 2019-05-11 ENCOUNTER — Telehealth: Payer: Self-pay | Admitting: *Deleted

## 2019-05-11 NOTE — Telephone Encounter (Signed)
Patient called stating that he was on 15 units of insulin and was advised to increase it to 20 units. Patient stated that he was at 20 units, but dropped it back to 18 because of low blood sugar episodes. Patient stated that twice during the night recently his blood sugar dropped around 2:00-3:00 am and he had to get up and eat something. Patient stated that he did not check his blood sugar because he got in a panic and knew that his sugar was too low. Patient stated that he does not routinely check his blood sugar. Patient was advised that when he has an episode of low blood sugar he needs to check his sugar and let Dr. [name] know how low his sugar dropped. Patient verbalized understanding stating that he will check his blood sugar more often to know how it is running and keep Dr. [name] informed.

## 2019-05-11 NOTE — Telephone Encounter (Signed)
Noted. Agree with dropping back to 18 units and following FBS and 2 hour postprandials as well as checking when he feels bad.

## 2019-05-12 ENCOUNTER — Other Ambulatory Visit: Payer: Self-pay

## 2019-05-12 ENCOUNTER — Ambulatory Visit (INDEPENDENT_AMBULATORY_CARE_PROVIDER_SITE_OTHER): Payer: Managed Care, Other (non HMO) | Admitting: Cardiology

## 2019-05-12 ENCOUNTER — Encounter: Payer: Managed Care, Other (non HMO) | Admitting: Internal Medicine

## 2019-05-12 ENCOUNTER — Encounter: Payer: Self-pay | Admitting: Cardiology

## 2019-05-12 VITALS — BP 126/82 | HR 71 | Ht 73.0 in | Wt 192.4 lb

## 2019-05-12 DIAGNOSIS — I428 Other cardiomyopathies: Secondary | ICD-10-CM

## 2019-05-12 NOTE — Patient Instructions (Signed)
Medication Instructions:  Your physician recommends that you continue on your current medications as directed. Please refer to the Current Medication list given to you today.  *If you need a refill on your cardiac medications before your next appointment, please call your pharmacy*  Labwork: None ordered  Testing/Procedures: None ordered  Follow-Up: Remote monitoring is used to monitor your Pacemaker or ICD from home. This monitoring reduces the number of office visits required to check your device to one time per year. It allows [place] to keep an eye on the functioning of your device to ensure it is working properly. You are scheduled for a device check from home on 06/30/19. You may send your transmission at any time that day. If you have a wireless device, the transmission will be sent automatically. After your physician reviews your transmission, you will receive a postcard with your next transmission date.  Your physician wants you to follow-up in: 1 year with Dr. Curt Bears.  You will receive a reminder letter in the mail two months in advance. If you don't receive a letter, please call our office to schedule the follow-up appointment.  Thank you for choosing [facility]!!   Trinidad Curet, RN [phone]

## 2019-05-12 NOTE — Progress Notes (Signed)
Electrophysiology Office Note   Date:  05/12/2019   ID:  LAMONTAE RICARDO, DOB 1971-01-09, MRN [id number]  PCP:  [name], MD  Cardiologist:  [name] Primary Electrophysiologist:  Dimitrious Micciche Meredith Leeds, MD    No chief complaint on file.    History of Present Illness: Philip Ponce is a 48 y.o. male who is being seen today for the evaluation of CHF at the request of [name], MD. Presenting today for electrophysiology evaluation.  3 of diabetes, nonischemic cardiomyopathy and is status post gastric bypass.  He had a GI bleed that occurred in the setting of NSAIDs.  March 2018 he was admitted [facility] with chest pain, dyspnea, left bundle branch block.  Left heart catheterization showed normal coronary arteries with an echocardiogram with an EF of 20 to 25%.  He was placed on carvedilol.  Repeat echo showed an ejection fraction of 20 to 25% and he is now status post Medtronic CRT-D implanted 06/26/2017.  Today, he denies symptoms of palpitations, chest pain, shortness of breath, orthopnea, PND, lower extremity edema, claudication, dizziness, presyncope, syncope, bleeding, or neurologic sequela. The patient is tolerating medications without difficulties.  He has continued to notice diaphragmatic stim likely from his LV lead.  Otherwise he is doing well without complaint.   Past Medical History:  Diagnosis Date  . Acute blood loss anemia 11/20/2016  . AICD (automatic cardioverter/defibrillator) present    a. 06/2017 s/p MDT DTMA 1QQ [place] MRI Quad CRT-D SureScan (ser # [id number]).  . Chronic combined systolic (congestive) and diastolic (congestive) heart failure ([place])    a. 11/2016 Echo: EF 20-25%, glob HK, antsept, ant, apical HK, Gr1 DD, mild MR, mildly dil LA, nl RV fxn; b. 05/2017 Echo: Ef 20-25%, Gr2 DD, prominent apical trabeculations; c. 12/2017 Echo: EF 30-35%, Gr1 DD, glob HK.  . Diabetes mellitus ([place])   . Essential hypertension   . Family history of  adverse reaction to anesthesia    mom had a hard time waking up  . GERD (gastroesophageal reflux disease)   . NICM (nonischemic cardiomyopathy) ([place])    a. 11/2016 Cath: nl cors, EF 25-30%; b. 11/2016 Echo: EF 20-25%; c. 05/2017 Echo: EF 20-25%; d. 12/2017 Echo: EF 30-35%, Gr1 DD.  Marland Kitchen Slipped intervertebral disc    L4 L5  . STEMI (ST elevation myocardial infarction) ([place])    a. 11/2016 ST elevation -->Nl cors on cath.   Past Surgical History:  Procedure Laterality Date  . BIV ICD INSERTION CRT-D  06/26/2017  . BIV ICD INSERTION CRT-D N/A 06/26/2017   Procedure: BIV ICD INSERTION CRT-D;  Surgeon: [name], MD;  Location: [place] [facility];  Service: Cardiovascular;  Laterality: N/A;  . CERVICAL DISC ARTHROPLASTY N/A 07/24/2017   Procedure: CERVICAL ANTERIOR [place] ARTHROPLASTY C5-C7;  Surgeon: [name], MD;  Location: [facility] ORS;  Service: Neurosurgery;  Laterality: N/A;  . COLONOSCOPY WITH PROPOFOL N/A 12/03/2016   Procedure: COLONOSCOPY WITH PROPOFOL;  Surgeon: [name], MD;  Location: [facility] ENDOSCOPY;  Service: Endoscopy;  Laterality: N/A;  . ESOPHAGOGASTRODUODENOSCOPY (EGD) WITH PROPOFOL N/A 12/03/2016   Procedure: ESOPHAGOGASTRODUODENOSCOPY (EGD) WITH PROPOFOL;  Surgeon: [name], MD;  Location: [facility] ENDOSCOPY;  Service: Endoscopy;  Laterality: N/A;  . FINGER SURGERY Right 2012   index finger  . GASTRIC BYPASS    . GIVENS CAPSULE STUDY N/A 01/07/2017   Procedure: GIVENS CAPSULE STUDY;  Surgeon: [name], MD;  Location: [facility] ENDOSCOPY;  Service: Endoscopy;  Laterality: N/A;  .  LEFT HEART CATH AND CORONARY ANGIOGRAPHY N/A 12/04/2016   Procedure: Left Heart Cath and Coronary Angiography;  Surgeon: [name], MD;  Location: [place] [facility];  Service: Cardiovascular;  Laterality: N/A;  . SHOULDER ARTHROSCOPY WITH OPEN ROTATOR CUFF REPAIR Left 10/04/2017   Procedure: SHOULDER ARTHROSCOPY WITH ROTATOR CUFF REPAIR, SUBACROMIAL DECOMPRESSION,OPEN BICEP TENODESIS,  EXTENSIVE DEBRIDEMENT;  Surgeon: [name], MD;  Location: [facility] ORS;  Service: Orthopedics;  Laterality: Left;     Current Outpatient Medications  Medication Sig Dispense Refill  . calcium carbonate (OS-CAL) 600 MG TABS tablet Take 1,200 mg by mouth daily with breakfast.     . carvedilol (COREG) 12.5 MG tablet TAKE 1 TABLET BY MOUTH TWICE DAILY 180 tablet 3  . glucose blood (ONE TOUCH ULTRA TEST) test strip Check blood sugar once daily and as instructed. Dx E11.9 100 each 1  . ibuprofen (ADVIL,MOTRIN) 600 MG tablet Take 600 mg by mouth every 6 (six) hours as needed. for pain  0  . Insulin Glargine (BASAGLAR KWIKPEN) 100 UNIT/ML SOPN Inject 0.2 mLs (20 Units total) into the skin daily. 15 mL 11  . metFORMIN (GLUCOPHAGE-XR) 500 MG 24 hr tablet Take 1 tablet (500 mg total) by mouth daily with breakfast. 30 tablet 11  . Multiple Vitamins-Minerals (ADULT GUMMY PO) Take 2 tablets by mouth daily.    . Omega-3 Fatty Acids (FISH OIL PO) Take 800 mg by mouth daily.    Marland Kitchen omeprazole (PRILOSEC) 40 MG capsule Take 40 mg by mouth daily as needed (indigestion).     . sacubitril-valsartan (ENTRESTO) 97-103 MG Take 1 tablet by mouth 2 (two) times daily. 180 tablet 3  . spironolactone (ALDACTONE) 25 MG tablet Take 1 tablet (25 mg total) by mouth daily. 90 tablet 3   No current facility-administered medications for this visit.     Allergies:   Mobic [meloxicam], Tramadol, and Diclofenac   Social History:  The patient  reports that he has never smoked. He has never used smokeless tobacco. He reports current alcohol use of about 4.0 standard drinks of alcohol per week. He reports that he does not use drugs.   Family History:  The patient's family history includes Cancer in his father, maternal grandfather, and mother; Diabetes in his father and mother; Hypertension in his father and mother; Stroke in his maternal grandfather.    ROS:  Please see the history of present illness.   Otherwise, review of systems  is positive for none.   All other systems are reviewed and negative.    PHYSICAL EXAM: VS:  BP 126/82   Pulse 71   Ht 6\' 1"  (1.854 m)   Wt 192 lb 6.4 oz (87.3 kg)   SpO2 98%   BMI 25.38 kg/m  , BMI Body mass index is 25.38 kg/m. GEN: Well nourished, well developed, in no acute distress  HEENT: normal  Neck: no JVD, carotid bruits, or masses Cardiac: RRR; no murmurs, rubs, or gallops,no edema  Respiratory:  clear to auscultation bilaterally, normal work of breathing GI: soft, nontender, nondistended, + BS MS: no deformity or atrophy  Skin: warm and dry, device pocket is well healed Neuro:  Strength and sensation are intact Psych: euthymic mood, full affect  EKG:  EKG is ordered today. Personal review of the ekg ordered shows atrial sense, V paced  Device interrogation is reviewed today in detail.  See PaceArt for details.   Recent Labs: 10/30/2018: BNP 97.2 04/10/2019: ALT 12; BUN 18; Creatinine, Ser 1.02; Potassium 5.0; Sodium  135 04/21/2019: Hemoglobin 12.5; Platelets 194    Lipid Panel     Component Value Date/Time   CHOL 137 04/10/2019 1003   TRIG 103.0 04/10/2019 1003   HDL 64.80 04/10/2019 1003   CHOLHDL 2 04/10/2019 1003   VLDL 20.6 04/10/2019 1003   LDLCALC 51 04/10/2019 1003     Wt Readings from Last 3 Encounters:  05/12/19 192 lb 6.4 oz (87.3 kg)  04/21/19 190 lb 6.4 oz (86.4 kg)  04/16/19 186 lb 8 oz (84.6 kg)      Other studies Reviewed: Additional studies/ records that were reviewed today include: TTE 01/06/18  Review of the above records today demonstrates:  - Left ventricle: The cavity size was mildly dilated. Wall   thickness was normal. Systolic function was moderately to   severely reduced. The estimated ejection fraction was in the   range of 30% to 35%. Diffuse hypokinesis. Doppler parameters are   consistent with abnormal left ventricular relaxation (grade 1   diastolic dysfunction).   ASSESSMENT AND PLAN:  1.  Systolic heart failure  due to nonischemic cardiomyopathy: Currently on optimal medical therapy.  Status post Medtronic CRT-D implanted 06/26/2017.  Thinks he is feeling diaphragmatic stim from his LV lead.  Niketa Turner decrease output as he felt it on each pull.  Is unclear as to whether or not he is simply feeling his ventricular impulse instead of having diaphragmatic stim.  2.  Hypertension: Fairly well controlled    Current medicines are reviewed at length with the patient today.   The patient does not have concerns regarding his medicines.  The following changes were made today:  none  Labs/ tests ordered today include:  Orders Placed This Encounter  Procedures  . EKG 12-Lead     Disposition:   FU with Braylon Lemmons 1 year  Signed, Tmya Wigington Meredith Leeds, MD  05/12/2019 3:28 PM     [place] [address] [place] [address] [phone] (office) [phone] (fax)

## 2019-05-13 NOTE — Telephone Encounter (Signed)
Qaadir notified as instructed by telephone.  Medication list updated.

## 2019-05-15 ENCOUNTER — Telehealth: Payer: Self-pay | Admitting: Cardiology

## 2019-05-15 NOTE — Telephone Encounter (Signed)
Patient calling back.   °

## 2019-05-15 NOTE — Telephone Encounter (Signed)
LMOVM at pt's number and wife's number requesting call back to DC. Direct number given.  Per Dr. Curt Bears-- as of 05/12/19, device is functioning normally, no issues noted. Vector testing performed and LV output reduced due to ? PNS.

## 2019-05-15 NOTE — Telephone Encounter (Signed)
Pt wife called back and stated that she will call back later.

## 2019-05-15 NOTE — Telephone Encounter (Addendum)
Spoke with patient's wife. She reports pt seems more uncomfortable since appointment on 8/18. Pt frequently shifts position now, constantly tells her he feels "flickering" in his chest. She requests that I speak with patient with any additional questions. Advised I will call him this afternoon. She is in agreement with plan.

## 2019-05-15 NOTE — Telephone Encounter (Signed)
Patient was seen in the office on 05/12/2019. He is having a uncomfortable sensation around his device and would like some follow up information from the appointment.

## 2019-05-15 NOTE — Telephone Encounter (Signed)
Spoke with patient. He reports that he continues to notice "flicking" in his lower left ribcage whenever he is laying or leaning on his left side. It is persistent whenever he is in these positions. About the same the past few months. Denies any discomfort at the actual device site. Pt reports that at his visit on 8/18, he had similar "flicking" with all vectors that were tested by MDT rep. Reassured patient that this sensation is not dangerous. Pt verbalizes understanding, reports it is just very annoying and uncomfortable after awhile. Advised I will discuss any additional programming options with MDT rep and Dr. Curt Bears and call him back next Tuesday. Pt is in agreement with this plan. No further questions or concerns at this time.

## 2019-05-18 ENCOUNTER — Other Ambulatory Visit: Payer: Self-pay | Admitting: Cardiology

## 2019-05-19 ENCOUNTER — Telehealth (HOSPITAL_COMMUNITY): Payer: Self-pay | Admitting: Cardiology

## 2019-05-19 NOTE — Telephone Encounter (Signed)
Patient returned my call and spoke with [facility].  Pt scheduled 05/25/2019

## 2019-05-19 NOTE — Telephone Encounter (Signed)
Discussed with Dr. Glorious Peach other option at this time is to turn LV lead off to see if that improves the sensation, though this may make his HF symptoms worse. Pt reports he will think about it, but right now he does not want to turn the lead off, reports the "flicking" is tolerable. He is aware to call back if he changes his mind. He thanked me for my call and denies additional questions at this time.

## 2019-05-19 NOTE — Telephone Encounter (Signed)
Called and left message for patient to call me.  Cyril Mourning has given me dates and times to get pt scheduled for his CPX.

## 2019-05-25 ENCOUNTER — Encounter (HOSPITAL_COMMUNITY): Payer: Managed Care, Other (non HMO)

## 2019-05-29 ENCOUNTER — Other Ambulatory Visit (HOSPITAL_COMMUNITY): Payer: Self-pay | Admitting: *Deleted

## 2019-06-05 ENCOUNTER — Other Ambulatory Visit (HOSPITAL_COMMUNITY)
Admission: RE | Admit: 2019-06-05 | Discharge: 2019-06-05 | Disposition: A | Discharge status: Home / Self Care | Payer: Managed Care, Other (non HMO) | Source: Ambulatory Visit | Attending: Cardiology | Admitting: Cardiology

## 2019-06-05 DIAGNOSIS — Z20828 Contact with and (suspected) exposure to other viral communicable diseases: Secondary | ICD-10-CM | POA: Insufficient documentation

## 2019-06-05 DIAGNOSIS — Z01812 Encounter for preprocedural laboratory examination: Secondary | ICD-10-CM | POA: Insufficient documentation

## 2019-06-07 LAB — NOVEL CORONAVIRUS, NAA (HOSP ORDER, SEND-OUT TO REF LAB; TAT 18-24 HRS): SARS-CoV-2, NAA: NOT DETECTED

## 2019-06-09 ENCOUNTER — Ambulatory Visit (HOSPITAL_COMMUNITY): Payer: Managed Care, Other (non HMO) | Attending: Cardiology

## 2019-06-09 ENCOUNTER — Other Ambulatory Visit: Payer: Self-pay

## 2019-06-09 ENCOUNTER — Other Ambulatory Visit (HOSPITAL_COMMUNITY): Payer: Self-pay | Admitting: *Deleted

## 2019-06-09 DIAGNOSIS — I5042 Chronic combined systolic (congestive) and diastolic (congestive) heart failure: Secondary | ICD-10-CM | POA: Diagnosis not present

## 2019-06-11 ENCOUNTER — Telehealth (HOSPITAL_COMMUNITY): Payer: Self-pay

## 2019-06-11 ENCOUNTER — Other Ambulatory Visit: Payer: Self-pay | Admitting: Family Medicine

## 2019-06-11 ENCOUNTER — Telehealth: Payer: Self-pay | Admitting: Family Medicine

## 2019-06-11 DIAGNOSIS — Z950 Presence of cardiac pacemaker: Secondary | ICD-10-CM

## 2019-06-11 DIAGNOSIS — I447 Left bundle-branch block, unspecified: Secondary | ICD-10-CM

## 2019-06-11 DIAGNOSIS — I5042 Chronic combined systolic (congestive) and diastolic (congestive) heart failure: Secondary | ICD-10-CM

## 2019-06-11 DIAGNOSIS — E119 Type 2 diabetes mellitus without complications: Secondary | ICD-10-CM

## 2019-06-11 DIAGNOSIS — I428 Other cardiomyopathies: Secondary | ICD-10-CM

## 2019-06-11 NOTE — Telephone Encounter (Signed)
-----   Message from [name], MD sent at 06/09/2019  4:32 PM EDT ----- This looks more like deconditioning than limitation from HF. Needs exercise regimen.

## 2019-06-11 NOTE — Telephone Encounter (Signed)
Patient is requesting a new referral sent over to a cardiologist. He is currently seeing someone but they are not happy with the care. He would like to be seen at [facility] by Dr Clayborn Bigness

## 2019-06-11 NOTE — Telephone Encounter (Signed)
Pt aware of results and appreciative. Verbalized understanding

## 2019-06-11 NOTE — Telephone Encounter (Signed)
Patient was able to go ahead and schedule an appointment with the doctor. Wednesday 9/23 @ 2:00pm  They were not sure if a referral was needed but the office stated to give [place] the appt day/time to attach to the referral Fax- [phone]

## 2019-06-11 NOTE — Progress Notes (Signed)
Referral order placed.

## 2019-06-22 ENCOUNTER — Encounter (HOSPITAL_COMMUNITY): Payer: Managed Care, Other (non HMO) | Admitting: Cardiology

## 2019-06-30 ENCOUNTER — Ambulatory Visit (INDEPENDENT_AMBULATORY_CARE_PROVIDER_SITE_OTHER): Payer: Managed Care, Other (non HMO) | Admitting: *Deleted

## 2019-06-30 DIAGNOSIS — I428 Other cardiomyopathies: Secondary | ICD-10-CM

## 2019-06-30 DIAGNOSIS — I5042 Chronic combined systolic (congestive) and diastolic (congestive) heart failure: Secondary | ICD-10-CM

## 2019-07-01 LAB — CUP PACEART REMOTE DEVICE CHECK
Battery Remaining Longevity: 79 mo
Battery Voltage: 2.98 V
Brady Statistic AP VP Percent: 0 %
Brady Statistic AP VS Percent: 0 %
Brady Statistic AS VP Percent: 98.74 %
Brady Statistic AS VS Percent: 1.26 %
Brady Statistic RA Percent Paced: 0.01 %
Brady Statistic RV Percent Paced: 70.29 %
Date Time Interrogation Session: 20201006082305
HighPow Impedance: 50 Ohm
Implantable Lead Implant Date: 20181003
Implantable Lead Implant Date: 20181003
Implantable Lead Implant Date: 20181003
Implantable Lead Location: 753858
Implantable Lead Location: 753859
Implantable Lead Location: 753860
Implantable Lead Model: 4398
Implantable Lead Model: 5076
Implantable Pulse Generator Implant Date: 20181003
Lead Channel Impedance Value: 165.029
Lead Channel Impedance Value: 165.029
Lead Channel Impedance Value: 172.541
Lead Channel Impedance Value: 189.525
Lead Channel Impedance Value: 189.525
Lead Channel Impedance Value: 247 Ohm
Lead Channel Impedance Value: 304 Ohm
Lead Channel Impedance Value: 304 Ohm
Lead Channel Impedance Value: 361 Ohm
Lead Channel Impedance Value: 361 Ohm
Lead Channel Impedance Value: 399 Ohm
Lead Channel Impedance Value: 399 Ohm
Lead Channel Impedance Value: 475 Ohm
Lead Channel Impedance Value: 532 Ohm
Lead Channel Impedance Value: 551 Ohm
Lead Channel Impedance Value: 551 Ohm
Lead Channel Impedance Value: 608 Ohm
Lead Channel Impedance Value: 646 Ohm
Lead Channel Pacing Threshold Amplitude: 0.5 V
Lead Channel Pacing Threshold Amplitude: 0.875 V
Lead Channel Pacing Threshold Amplitude: 1.25 V
Lead Channel Pacing Threshold Pulse Width: 0.4 ms
Lead Channel Pacing Threshold Pulse Width: 0.4 ms
Lead Channel Pacing Threshold Pulse Width: 0.4 ms
Lead Channel Sensing Intrinsic Amplitude: 2.125 mV
Lead Channel Sensing Intrinsic Amplitude: 2.125 mV
Lead Channel Sensing Intrinsic Amplitude: 8.125 mV
Lead Channel Sensing Intrinsic Amplitude: 8.125 mV
Lead Channel Setting Pacing Amplitude: 1.5 V
Lead Channel Setting Pacing Amplitude: 2 V
Lead Channel Setting Pacing Amplitude: 2.5 V
Lead Channel Setting Pacing Pulse Width: 0.4 ms
Lead Channel Setting Pacing Pulse Width: 0.4 ms
Lead Channel Setting Sensing Sensitivity: 0.3 mV

## 2019-07-06 ENCOUNTER — Telehealth: Payer: Self-pay | Admitting: *Deleted

## 2019-07-06 NOTE — Telephone Encounter (Signed)
[place] [facility] [facility] Night - Client TELEPHONE ADVICE RECORD [facility] Patient Name: Philip Ponce Gender: Male DOB: [birth year] Age: 48 Y 22 M 15 D Return Phone Number: [phone] (Primary), [phone] (Secondary) Address: City/State/Zip: [place] [address] Client [facility] Night - Client Client Site [place] Physician Eliezer Lofts - MD Contact Type Call Who Is Calling Patient / Member / Family / Caregiver Call Type Triage / Clinical Caller Name Kavarion Kleinke Relationship To Patient Spouse Return Phone Number [phone] (Primary) Chief Complaint Prescription Refill or Medication Request (non symptomatic) Reason for Call Symptomatic / Request for [place] states husband is on metformin which is being recalled; has some questions about how his MD is going to handle this and can get a different med? no sx. Translation No Nurse Assessment Nurse: Lucky Cowboy, RN, Levada Dy Date/Time (Eastern Time): 07/05/2019 10:00:09 AM Please select the assessment type ---Verbal order / New medication order Additional Documentation ---Glucophage is recalled and he is worried about it. Ins per FDA guidance on this to continue taking the med as ordered and to call the office in the morning, that we are following the guidelines from the FDA on what to do. Does the client directives allow for assistance with medications after hours? ---No Additional Documentation ---No s/s. Guidelines Guideline Title Affirmed Question Affirmed Notes Nurse Date/Time (Eastern Time) Disp. Time Eilene Ghazi Time) Disposition Final User 07/05/2019 9:43:58 AM Attempt made - message left Dew, RN, Levada Dy 07/05/2019 10:03:40 AM Clinical Call Yes Lucky Cowboy, RN, Levada Dy

## 2019-07-06 NOTE — Telephone Encounter (Signed)
It is only some manufacturers of some ER metformin... have him contact his pharmacy to see if his has been recalled or we can send in new refill if he would like ( they would fill with non- recalled). If he wants we can change to short acting to take BID which has not been recalled that would be fine as well.

## 2019-07-06 NOTE — Telephone Encounter (Signed)
Patient left a voicemail stating that there has been a recall on his Metformin ER 500 mg. Patient wants to know if there is an alternative? Pharmacy [facility] [place]

## 2019-07-06 NOTE — Telephone Encounter (Signed)
Payam notified as instructed by telephone.  He will check with his pharmacy about the recall.

## 2019-07-09 ENCOUNTER — Telehealth: Payer: Self-pay | Admitting: Family Medicine

## 2019-07-09 DIAGNOSIS — Z794 Long term (current) use of insulin: Secondary | ICD-10-CM

## 2019-07-09 DIAGNOSIS — E7800 Pure hypercholesterolemia, unspecified: Secondary | ICD-10-CM

## 2019-07-09 DIAGNOSIS — D509 Iron deficiency anemia, unspecified: Secondary | ICD-10-CM

## 2019-07-09 DIAGNOSIS — E119 Type 2 diabetes mellitus without complications: Secondary | ICD-10-CM

## 2019-07-09 NOTE — Telephone Encounter (Signed)
-----   Message from Cloyd Stagers, RT sent at 06/29/2019  4:34 PM EDT ----- Regarding: Lab Orders for Friday 10.16.2020 Please place lab orders for Friday 10.16.2020, office visit for 3 month DM f/u on Friday 10.23.2020 Thank you, Dyke Maes RT(R)

## 2019-07-09 NOTE — Progress Notes (Signed)
Remote ICD transmission.   

## 2019-07-10 ENCOUNTER — Other Ambulatory Visit (INDEPENDENT_AMBULATORY_CARE_PROVIDER_SITE_OTHER): Payer: Managed Care, Other (non HMO)

## 2019-07-10 DIAGNOSIS — E119 Type 2 diabetes mellitus without complications: Secondary | ICD-10-CM

## 2019-07-10 DIAGNOSIS — Z794 Long term (current) use of insulin: Secondary | ICD-10-CM

## 2019-07-10 LAB — COMPREHENSIVE METABOLIC PANEL
ALT: 14 U/L (ref 0–53)
AST: 15 U/L (ref 0–37)
Albumin: 4 g/dL (ref 3.5–5.2)
Alkaline Phosphatase: 65 U/L (ref 39–117)
BUN: 15 mg/dL (ref 6–23)
CO2: 30 mEq/L (ref 19–32)
Calcium: 9 mg/dL (ref 8.4–10.5)
Chloride: 102 mEq/L (ref 96–112)
Creatinine, Ser: 0.94 mg/dL (ref 0.40–1.50)
GFR: 102.73 mL/min (ref >60.00)
Glucose, Bld: 175 mg/dL — ABNORMAL HIGH (ref 70–99)
Potassium: 4.3 mEq/L (ref 3.5–5.1)
Sodium: 137 mEq/L (ref 135–145)
Total Bilirubin: 0.6 mg/dL (ref 0.2–1.2)
Total Protein: 7.2 g/dL (ref 6.0–8.3)

## 2019-07-10 LAB — LIPID PANEL
Cholesterol: 123 mg/dL (ref 0–200)
HDL: 53.9 mg/dL (ref >39.00)
LDL Cholesterol: 58 mg/dL (ref 0–99)
NonHDL: 68.74
Total CHOL/HDL Ratio: 2
Triglycerides: 56 mg/dL (ref 0.0–149.0)
VLDL: 11.2 mg/dL (ref 0.0–40.0)

## 2019-07-10 LAB — HEMOGLOBIN A1C: Hgb A1c MFr Bld: 6.8 % — ABNORMAL HIGH (ref 4.6–6.5)

## 2019-07-10 NOTE — Progress Notes (Signed)
No critical labs need to be addressed urgently. We will discuss labs in detail at upcoming office visit.   

## 2019-07-17 ENCOUNTER — Ambulatory Visit: Payer: Managed Care, Other (non HMO) | Admitting: Family Medicine

## 2019-07-21 ENCOUNTER — Ambulatory Visit: Payer: Managed Care, Other (non HMO) | Admitting: Family Medicine

## 2019-07-21 ENCOUNTER — Telehealth (HOSPITAL_COMMUNITY): Payer: Self-pay

## 2019-07-21 NOTE — Telephone Encounter (Signed)
Received a Request for Medical Records from [facility]. Faxed to [phone] on 07/21/2019.

## 2019-07-23 ENCOUNTER — Ambulatory Visit (INDEPENDENT_AMBULATORY_CARE_PROVIDER_SITE_OTHER): Payer: Managed Care, Other (non HMO) | Admitting: Family Medicine

## 2019-07-23 ENCOUNTER — Other Ambulatory Visit: Payer: Self-pay

## 2019-07-23 ENCOUNTER — Other Ambulatory Visit (INDEPENDENT_AMBULATORY_CARE_PROVIDER_SITE_OTHER): Payer: Managed Care, Other (non HMO)

## 2019-07-23 VITALS — BP 120/90 | HR 81 | Temp 97.9°F | Ht 73.0 in | Wt 192.2 lb

## 2019-07-23 DIAGNOSIS — E119 Type 2 diabetes mellitus without complications: Secondary | ICD-10-CM

## 2019-07-23 DIAGNOSIS — Z23 Encounter for immunization: Secondary | ICD-10-CM

## 2019-07-23 DIAGNOSIS — I10 Essential (primary) hypertension: Secondary | ICD-10-CM | POA: Diagnosis not present

## 2019-07-23 DIAGNOSIS — Z794 Long term (current) use of insulin: Secondary | ICD-10-CM | POA: Diagnosis not present

## 2019-07-23 DIAGNOSIS — I428 Other cardiomyopathies: Secondary | ICD-10-CM

## 2019-07-23 LAB — MICROALBUMIN / CREATININE URINE RATIO
Creatinine,U: 82 mg/dL
Microalb Creat Ratio: 0.9 mg/g (ref 0.0–30.0)
Microalb, Ur: <0.7 mg/dL (ref 0.0–1.9)

## 2019-07-23 NOTE — Patient Instructions (Addendum)
Set up yearly eye exam.  Keep up with walking and low carb diet.

## 2019-07-23 NOTE — Progress Notes (Signed)
Chief Complaint  Patient presents with  . Diabetes    History of Present Illness: HPI   48 year old male wtih CHF, DM, HTN anemia  presents for folow up DM.  Body mass index is 25.36 kg/m.  Diabetes:   Now on metfomrin and insulin Balsalgar 18 units daily   He has been decreasing stress. Lab Results  Component Value Date   HGBA1C 6.8 (H) 07/10/2019  Using medications without difficulties: Hypoglycemic episodes: only if forgot med Hyperglycemic episodes:none Feet problems: no ulcers Blood Sugars averaging: FBS 120 eye exam within last year: due  Hypertension:    BP Readings from Last 3 Encounters:  07/23/19 120/90  05/12/19 126/82  04/21/19 129/78  Using medication without problems or lightheadedness: none  Chest pain with exertion:none Edema:none Short of breath: Average home BPs: good Other issues: Nonischemic Cardiomyopathy, EF improved on last ECHO to 35-40% followed, CHF by Cardiology  Now  Dr Clayborn Bigness Walking 1 mile a day.  COVID 19 screen No recent travel or known exposure to COVID19 The patient denies respiratory symptoms of COVID 19 at this time.  The importance of social distancing was discussed today.   Review of Systems  Constitutional: Negative for chills and fever.  HENT: Negative for congestion and ear pain.   Eyes: Negative for pain and redness.  Respiratory: Negative for cough and shortness of breath.   Cardiovascular: Negative for chest pain, palpitations and leg swelling.  Gastrointestinal: Negative for abdominal pain, blood in stool, constipation, diarrhea, nausea and vomiting.  Genitourinary: Negative for dysuria.  Musculoskeletal: Negative for falls and myalgias.  Skin: Negative for rash.  Neurological: Negative for dizziness.  Psychiatric/Behavioral: Negative for depression. The patient is not nervous/anxious.       Past Medical History:  Diagnosis Date  . Acute blood loss anemia 11/20/2016  . AICD (automatic  cardioverter/defibrillator) present    a. 06/2017 s/p MDT DTMA 1QQ [place] MRI Quad CRT-D SureScan (ser # [id number]).  . Chronic combined systolic (congestive) and diastolic (congestive) heart failure ([place])    a. 11/2016 Echo: EF 20-25%, glob HK, antsept, ant, apical HK, Gr1 DD, mild MR, mildly dil LA, nl RV fxn; b. 05/2017 Echo: Ef 20-25%, Gr2 DD, prominent apical trabeculations; c. 12/2017 Echo: EF 30-35%, Gr1 DD, glob HK.  . Diabetes mellitus ([place])   . Essential hypertension   . Family history of adverse reaction to anesthesia    mom had a hard time waking up  . GERD (gastroesophageal reflux disease)   . NICM (nonischemic cardiomyopathy) ([place])    a. 11/2016 Cath: nl cors, EF 25-30%; b. 11/2016 Echo: EF 20-25%; c. 05/2017 Echo: EF 20-25%; d. 12/2017 Echo: EF 30-35%, Gr1 DD.  Marland Kitchen Slipped intervertebral disc    L4 L5  . STEMI (ST elevation myocardial infarction) ([place])    a. 11/2016 ST elevation -->Nl cors on cath.    reports that he has never smoked. He has never used smokeless tobacco. He reports current alcohol use of about 4.0 standard drinks of alcohol per week. He reports that he does not use drugs.   Current Outpatient Medications:  .  calcium carbonate (OS-CAL) 600 MG TABS tablet, Take 1,200 mg by mouth daily with breakfast. , Disp: , Rfl:  .  carvedilol (COREG) 12.5 MG tablet, TAKE 1 TABLET BY MOUTH TWICE DAILY, Disp: 180 tablet, Rfl: 3 .  digoxin (LANOXIN) 0.125 MG tablet, Take 125 mcg by mouth daily., Disp: , Rfl:  .  glucose blood (ONE TOUCH ULTRA TEST) test  strip, Check blood sugar once daily and as instructed. Dx E11.9, Disp: 100 each, Rfl: 1 .  ibuprofen (ADVIL,MOTRIN) 600 MG tablet, Take 600 mg by mouth every 6 (six) hours as needed. for pain, Disp: , Rfl: 0 .  Insulin Glargine (BASAGLAR KWIKPEN) 100 UNIT/ML SOPN, Inject 18 Units into the skin daily., Disp: , Rfl:  .  metFORMIN (GLUCOPHAGE-XR) 500 MG 24 hr tablet, Take 1 tablet (500 mg total) by mouth daily with breakfast., Disp: 30  tablet, Rfl: 11 .  Multiple Vitamins-Minerals (ADULT GUMMY PO), Take 2 tablets by mouth daily., Disp: , Rfl:  .  Omega-3 Fatty Acids (FISH OIL PO), Take 800 mg by mouth daily., Disp: , Rfl:  .  omeprazole (PRILOSEC) 40 MG capsule, Take 40 mg by mouth daily as needed (indigestion). , Disp: , Rfl:  .  sacubitril-valsartan (ENTRESTO) 97-103 MG, Take 1 tablet by mouth 2 (two) times daily., Disp: 180 tablet, Rfl: 3 .  spironolactone (ALDACTONE) 25 MG tablet, Take 1 tablet (25 mg total) by mouth daily., Disp: 90 tablet, Rfl: 3   Observations/Objective: Blood pressure 120/90, pulse 81, temperature 97.9 F (36.6 C), temperature source Temporal, height 6\' 1"  (1.854 m), weight 192 lb 4 oz (87.2 kg), SpO2 98 %.  Physical Exam Constitutional:      Appearance: He is well-developed.  HENT:     Head: Normocephalic.     Right Ear: Hearing normal.     Left Ear: Hearing normal.     Nose: Nose normal.  Neck:     Thyroid: No thyroid mass or thyromegaly.     Vascular: No carotid bruit.     Trachea: Trachea normal.  Cardiovascular:     Rate and Rhythm: Normal rate and regular rhythm.     Pulses: Normal pulses.     Heart sounds: Heart sounds not distant. No murmur. No friction rub. No gallop.      Comments: No peripheral edema Pulmonary:     Effort: Pulmonary effort is normal. No respiratory distress.     Breath sounds: Normal breath sounds.  Skin:    General: Skin is warm and dry.     Findings: No rash.  Psychiatric:        Speech: Speech normal.        Behavior: Behavior normal.        Thought Content: Thought content normal.      Assessment and Plan Non-ischemic cardiomyopathy ([place]) Followed by cardiology.  Type 2 diabetes mellitus without complication, with long-term current use of insulin (HCC) Stable control on current regimen. Encouraged exercise, weight loss, healthy eating habits.   Essential hypertension, benign Well controlled. Continue current medication.        Eliezer Lofts, MD

## 2019-07-27 ENCOUNTER — Telehealth: Payer: Self-pay | Admitting: Cardiology

## 2019-07-27 NOTE — Telephone Encounter (Signed)
FMLA form placed in box for Dr. Curt Bears to review. 07/27/19 vlm

## 2019-07-28 ENCOUNTER — Telehealth: Payer: Self-pay | Admitting: Cardiology

## 2019-07-28 NOTE — Telephone Encounter (Signed)
Pt not taken out of work by Dr. Curt Bears. Pt had virtual visit with Dr. [name] on 01/15/19. Returning unsigned form to [employer] at [place]. 07/28/19 vlm

## 2019-08-04 NOTE — Telephone Encounter (Signed)
Received forms for md review. Placed in nurse box.

## 2019-08-17 NOTE — Assessment & Plan Note (Signed)
Stable control on current regimen. Encouraged exercise, weight loss, healthy eating habits.

## 2019-08-17 NOTE — Assessment & Plan Note (Signed)
Followed by cardiology 

## 2019-08-17 NOTE — Assessment & Plan Note (Signed)
Well controlled. Continue current medication.  

## 2019-08-24 ENCOUNTER — Inpatient Hospital Stay: Payer: Managed Care, Other (non HMO) | Attending: Oncology

## 2019-09-29 ENCOUNTER — Ambulatory Visit (INDEPENDENT_AMBULATORY_CARE_PROVIDER_SITE_OTHER): Payer: Managed Care, Other (non HMO) | Admitting: *Deleted

## 2019-09-29 ENCOUNTER — Other Ambulatory Visit (HOSPITAL_COMMUNITY): Payer: Self-pay

## 2019-09-29 ENCOUNTER — Other Ambulatory Visit (HOSPITAL_COMMUNITY): Payer: Self-pay | Admitting: *Deleted

## 2019-09-29 DIAGNOSIS — I5042 Chronic combined systolic (congestive) and diastolic (congestive) heart failure: Secondary | ICD-10-CM | POA: Diagnosis not present

## 2019-09-29 LAB — CUP PACEART REMOTE DEVICE CHECK
Battery Remaining Longevity: 75 mo
Battery Voltage: 2.98 V
Brady Statistic AP VP Percent: 0.01 %
Brady Statistic AP VS Percent: 0 %
Brady Statistic AS VP Percent: 98.71 %
Brady Statistic AS VS Percent: 1.28 %
Brady Statistic RA Percent Paced: 0.01 %
Brady Statistic RV Percent Paced: 81.32 %
Date Time Interrogation Session: 20210105022822
HighPow Impedance: 49 Ohm
Implantable Lead Implant Date: 20181003
Implantable Lead Implant Date: 20181003
Implantable Lead Implant Date: 20181003
Implantable Lead Location: 753858
Implantable Lead Location: 753859
Implantable Lead Location: 753860
Implantable Lead Model: 4398
Implantable Lead Model: 5076
Implantable Pulse Generator Implant Date: 20181003
Lead Channel Impedance Value: 172.541
Lead Channel Impedance Value: 182.4 Ohm
Lead Channel Impedance Value: 185.366
Lead Channel Impedance Value: 212.8 Ohm
Lead Channel Impedance Value: 216.848
Lead Channel Impedance Value: 285 Ohm
Lead Channel Impedance Value: 304 Ohm
Lead Channel Impedance Value: 342 Ohm
Lead Channel Impedance Value: 399 Ohm
Lead Channel Impedance Value: 418 Ohm
Lead Channel Impedance Value: 456 Ohm
Lead Channel Impedance Value: 475 Ohm
Lead Channel Impedance Value: 551 Ohm
Lead Channel Impedance Value: 608 Ohm
Lead Channel Impedance Value: 646 Ohm
Lead Channel Impedance Value: 665 Ohm
Lead Channel Impedance Value: 722 Ohm
Lead Channel Impedance Value: 760 Ohm
Lead Channel Pacing Threshold Amplitude: 0.5 V
Lead Channel Pacing Threshold Amplitude: 0.75 V
Lead Channel Pacing Threshold Amplitude: 1.25 V
Lead Channel Pacing Threshold Pulse Width: 0.4 ms
Lead Channel Pacing Threshold Pulse Width: 0.4 ms
Lead Channel Pacing Threshold Pulse Width: 0.4 ms
Lead Channel Sensing Intrinsic Amplitude: 2.125 mV
Lead Channel Sensing Intrinsic Amplitude: 2.125 mV
Lead Channel Sensing Intrinsic Amplitude: 8.125 mV
Lead Channel Sensing Intrinsic Amplitude: 8.125 mV
Lead Channel Setting Pacing Amplitude: 1.5 V
Lead Channel Setting Pacing Amplitude: 2 V
Lead Channel Setting Pacing Amplitude: 2.5 V
Lead Channel Setting Pacing Pulse Width: 0.4 ms
Lead Channel Setting Pacing Pulse Width: 0.4 ms
Lead Channel Setting Sensing Sensitivity: 0.3 mV

## 2019-09-29 MED ORDER — DIGOXIN 125 MCG PO TABS: 125.0000 ug | ORAL_TABLET | Freq: Every day | ORAL | 3 refills | Status: DC

## 2019-09-29 MED ORDER — DIGOXIN 125 MCG PO TABS: 125.0000 ug | ORAL_TABLET | Freq: Every day | ORAL | 3 refills | Status: AC

## 2019-10-13 ENCOUNTER — Telehealth: Payer: Self-pay | Admitting: *Deleted

## 2019-10-13 NOTE — Telephone Encounter (Signed)
Patient's wife called in reporting patient's device site felt warm to the touch last night. She is not aware of any other symptoms, also not sure how long this has been going on but reports pt told her it is not the first time. Advised I will need to discuss symptoms with pt. Pt's wife in agreement, requests I call him directly.  LMOVM for patient requesting call back to DC. Direct number and office hours provided.

## 2019-10-13 NOTE — Telephone Encounter (Signed)
Spoke with patient. Reports he noticed an area of his chest was "hot" (to palpation) at his left chest a few inches below device site last night. Also had some indigestion or gas that went away shortly after. Notices it when he's laying down, sensation of warmth comes and goes. Denies any drainage, redness, swelling, fever/chills, or warmth at device site. No chest discomfort, SOB, dizziness, palpitations, or other cardiac symptoms. Advised that as warmth is not directly at device site, and in the absence of any other symptoms, does not sound device-related. Will forward message to Dr. Curt Bears for review and recommendations. Pt verbalizes understanding and agreement with plan. No additional questions at this time.

## 2019-10-14 NOTE — Telephone Encounter (Signed)
Per discussion with Dr. Curt Bears, symptoms do not seem related to the device. In the event of increased warmth directly at device site, or any other signs/symptoms of infection, patient should contact us for in-clinic assessment. Dr. Curt Bears recommends f/u with PCP if indigestion symptoms continue.  LMOVM for patient's wife.  Spoke with patient, wife in background on speakerphone. Advised of Dr. Macky Lower recommendations. PCP f/u encouraged if symptoms persist. Pt verbalizes understanding and agrees to call for any signs/symptoms of infection at device site. No additional needs at this time.

## 2019-10-14 NOTE — Telephone Encounter (Signed)
The pt wife called to f/u to see is there anything more about the pt?

## 2019-10-28 ENCOUNTER — Telehealth: Payer: Self-pay

## 2019-10-28 NOTE — Telephone Encounter (Signed)
Pt was approved for Medicaid/Disability. He was going over his meds and  And they were questioning his metformin. I advised him he is on metformin er 500mg . Told him that if they have questions we would be glad to help. They can call us.

## 2019-10-28 NOTE — Telephone Encounter (Signed)
Pt left a message on triage line in regards to an issue with his metformin. I left a message for the pt to call back.

## 2019-12-01 ENCOUNTER — Telehealth: Payer: Self-pay | Admitting: Family Medicine

## 2019-12-01 MED ORDER — BASAGLAR KWIKPEN 100 UNIT/ML ~~LOC~~ SOPN: 18.0000 [IU] | PEN_INJECTOR | Freq: Every day | SUBCUTANEOUS | 0 refills | Status: DC

## 2019-12-01 MED ORDER — OMEPRAZOLE 40 MG PO CPDR: 40.0000 mg | DELAYED_RELEASE_CAPSULE | Freq: Every day | ORAL | 0 refills | Status: DC | PRN

## 2019-12-01 MED ORDER — METFORMIN HCL ER 500 MG PO TB24: 500.0000 mg | ORAL_TABLET | Freq: Every day | ORAL | 0 refills | Status: DC

## 2019-12-01 NOTE — Telephone Encounter (Signed)
Patient's wife called today She stated that the patient is using [facility] now for his prescriptions. They were advised to reach out to our office to have a medication list faxed over to the home delivery pharmacy to have his prescriptions taken care of  Fax- [phone]  Please advise

## 2019-12-01 NOTE — Addendum Note (Signed)
Addended by: Carter Kitten on: 12/01/2019 11:48 AM   Modules accepted: Orders

## 2019-12-01 NOTE — Telephone Encounter (Signed)
Pharmacy updated.  Medication list faxed as requested.

## 2019-12-16 ENCOUNTER — Telehealth: Payer: Self-pay

## 2019-12-16 NOTE — Telephone Encounter (Signed)
Pt wants to know how to get covid vaccine in case he decides to take the vaccine; pt will go to [facility] [email or web address] under covid vaccine banner and schedule appt. Pt said does not need anything else.

## 2019-12-17 ENCOUNTER — Ambulatory Visit: Payer: Self-pay | Attending: Internal Medicine

## 2019-12-17 DIAGNOSIS — Z23 Encounter for immunization: Secondary | ICD-10-CM

## 2019-12-17 NOTE — Progress Notes (Signed)
   Covid-19 Vaccination Clinic  Name:  Philip Ponce    MRN: [id number] DOB: 03-18-1971  12/17/2019  Philip Ponce was observed post Covid-19 immunization for 15 minutes without incident. He was provided with Vaccine Information Sheet and instruction to access the V-Safe system.   Philip Ponce was instructed to call 911 with any severe reactions post vaccine: Marland Kitchen Difficulty breathing  . Swelling of face and throat  . A fast heartbeat  . A bad rash all over body  . Dizziness and weakness   Immunizations Administered    Name Date Dose VIS Date Route   Pfizer COVID-19 Vaccine 12/17/2019  2:36 PM 0.3 mL 09/04/2019 Intramuscular   Manufacturer: [place]   Lot: [id number]   [place]: [id number]

## 2019-12-18 ENCOUNTER — Inpatient Hospital Stay: Payer: MEDICARE | Attending: Oncology

## 2019-12-18 DIAGNOSIS — D509 Iron deficiency anemia, unspecified: Secondary | ICD-10-CM

## 2019-12-18 DIAGNOSIS — E119 Type 2 diabetes mellitus without complications: Secondary | ICD-10-CM | POA: Insufficient documentation

## 2019-12-18 LAB — CBC WITH DIFFERENTIAL/PLATELET
Abs Immature Granulocytes: 0 10*3/uL (ref 0.00–0.07)
Basophils Absolute: 0 10*3/uL (ref 0.0–0.1)
Basophils Relative: 1 %
Eosinophils Absolute: 0.1 10*3/uL (ref 0.0–0.5)
Eosinophils Relative: 3 %
HCT: 38.8 % — ABNORMAL LOW (ref 39.0–52.0)
Hemoglobin: 12.4 g/dL — ABNORMAL LOW (ref 13.0–17.0)
Immature Granulocytes: 0 %
Lymphocytes Relative: 28 %
Lymphs Abs: 1.2 10*3/uL (ref 0.7–4.0)
MCH: 27.9 pg (ref 26.0–34.0)
MCHC: 32 g/dL (ref 30.0–36.0)
MCV: 87.4 fL (ref 80.0–100.0)
Monocytes Absolute: 0.5 10*3/uL (ref 0.1–1.0)
Monocytes Relative: 12 %
Neutro Abs: 2.4 10*3/uL (ref 1.7–7.7)
Neutrophils Relative %: 56 %
Platelets: 217 10*3/uL (ref 150–400)
RBC: 4.44 MIL/uL (ref 4.22–5.81)
RDW: 12.2 % (ref 11.5–15.5)
WBC: 4.3 10*3/uL (ref 4.0–10.5)
nRBC: 0 % (ref 0.0–0.2)

## 2019-12-18 LAB — IRON AND TIBC
Iron: 94 ug/dL (ref 45–182)
Saturation Ratios: 34 % (ref 17.9–39.5)
TIBC: 276 ug/dL (ref 250–450)
UIBC: 182 ug/dL

## 2019-12-18 LAB — FERRITIN: Ferritin: 58 ng/mL (ref 24–336)

## 2019-12-21 ENCOUNTER — Inpatient Hospital Stay (HOSPITAL_BASED_OUTPATIENT_CLINIC_OR_DEPARTMENT_OTHER): Payer: MEDICARE | Admitting: Oncology

## 2019-12-21 ENCOUNTER — Encounter: Payer: Self-pay | Admitting: Oncology

## 2019-12-21 ENCOUNTER — Inpatient Hospital Stay: Payer: MEDICARE

## 2019-12-21 DIAGNOSIS — D509 Iron deficiency anemia, unspecified: Secondary | ICD-10-CM

## 2019-12-21 NOTE — Progress Notes (Signed)
Patient stated that he had been feeling very tired and fatigued. Patient also stated that he would like to know if there is any way that he could get an iron treatment since he is always feeling very tired.

## 2019-12-24 NOTE — Progress Notes (Signed)
I connected with Philip Ponce on 12/24/19 at  2:30 PM EDT by video enabled telemedicine visit and verified that I am speaking with the correct person using two identifiers.   I discussed the limitations, risks, security and privacy concerns of performing an evaluation and management service by telemedicine and the availability of in-person appointments. I also discussed with the patient that there may be a patient responsible charge related to this service. The patient expressed understanding and agreed to proceed.  Other persons participating in the visit and their role in the encounter:  none  Patient's location:  home Provider's location:  work  Risk analyst Complaint: Routine follow-up of iron and B12 deficiency anemia  History of present illness: patient is a 49 year old male who was seen by Dr. Vicente Ponce for symptoms of bright red blood per rectum. Patient also had some epigastric pain in the past and was on NSAIDs at that time which was stopped. Patient's father died of colon cancer. Patient has a history of gastric bypass about 3 years ago. Last CBC from 12/05/2016 showed white count of 4.9, H&H of 8.1/24.1 and a platelet count of 349. Ferritin was low at 8. Serum iron was low at 15 and iron saturation was low at 5%. TIBC was normal at 295. H pylori stool antigen was negative. B12 folate and TSH were within normal limits. EGD on 12/03/2016 showed: Esophagus was normal. Evidence of gastric bypass. Otherwise normal findings of gastric bypass. Biopsies were taken for evaluation of celiac disease. Colonoscopy was normal. Patient also has a history of cardiomyopathy with an EF of 35% and during his admission for iron deficiency anemia and he was also found to have left bundle branch block and underwent cardiac catheterization which did not show any abnormalities. Patient has also had episodes of melena in February 2018 for which she was admitted to [facility] and was given PPI but did not undergo any EGD or  colonoscopy at that time. Capsule study has also been completed.  pateint has received 2 doses of feraheme so far  Patient has baseline nonischemic cardiomyopathy and his recent EF from April 2019 was 30 to 35%   Interval history : Patient reports doing well and denies any complaints at this time.  Denies any blood in his stool or urine.  Denies any dark melanotic stools   Review of Systems  Constitutional: Negative for chills, fever, malaise/fatigue and weight loss.  HENT: Negative for congestion, ear discharge and nosebleeds.   Eyes: Negative for blurred vision.  Respiratory: Negative for cough, hemoptysis, sputum production, shortness of breath and wheezing.   Cardiovascular: Negative for chest pain, palpitations, orthopnea and claudication.  Gastrointestinal: Negative for abdominal pain, blood in stool, constipation, diarrhea, heartburn, melena, nausea and vomiting.  Genitourinary: Negative for dysuria, flank pain, frequency, hematuria and urgency.  Musculoskeletal: Negative for back pain, joint pain and myalgias.  Skin: Negative for rash.  Neurological: Negative for dizziness, tingling, focal weakness, seizures, weakness and headaches.  Endo/Heme/Allergies: Does not bruise/bleed easily.  Psychiatric/Behavioral: Negative for depression and suicidal ideas. The patient does not have insomnia.     Allergies  Allergen Reactions  . Mobic [Meloxicam] Other (See Comments)    Ulcers in stomach eruption   . Tramadol     Hard to breath  . Diclofenac Palpitations and Other (See Comments)    Fast heart beat, CP,SOB and weakness on one side.    Past Medical History:  Diagnosis Date  . Acute blood loss anemia 11/20/2016  . AICD (automatic  cardioverter/defibrillator) present    a. 06/2017 s/p MDT DTMA 1QQ [place] MRI Quad CRT-D SureScan (ser # [id number]).  . Chronic combined systolic (congestive) and diastolic (congestive) heart failure ([place])    a. 11/2016 Echo: EF 20-25%, glob HK,  antsept, ant, apical HK, Gr1 DD, mild MR, mildly dil LA, nl RV fxn; b. 05/2017 Echo: Ef 20-25%, Gr2 DD, prominent apical trabeculations; c. 12/2017 Echo: EF 30-35%, Gr1 DD, glob HK.  . Diabetes mellitus ([place])   . Essential hypertension   . Family history of adverse reaction to anesthesia    mom had a hard time waking up  . GERD (gastroesophageal reflux disease)   . NICM (nonischemic cardiomyopathy) ([place])    a. 11/2016 Cath: nl cors, EF 25-30%; b. 11/2016 Echo: EF 20-25%; c. 05/2017 Echo: EF 20-25%; d. 12/2017 Echo: EF 30-35%, Gr1 DD.  Marland Kitchen Slipped intervertebral disc    L4 L5  . STEMI (ST elevation myocardial infarction) ([place])    a. 11/2016 ST elevation -->Nl cors on cath.    Past Surgical History:  Procedure Laterality Date  . BIV ICD INSERTION CRT-D  06/26/2017  . BIV ICD INSERTION CRT-D N/A 06/26/2017   Procedure: BIV ICD INSERTION CRT-D;  Surgeon: [name], MD;  Location: [place] [facility];  Service: Cardiovascular;  Laterality: N/A;  . CERVICAL DISC ARTHROPLASTY N/A 07/24/2017   Procedure: CERVICAL ANTERIOR [place] ARTHROPLASTY C5-C7;  Surgeon: [name], MD;  Location: [facility] ORS;  Service: Neurosurgery;  Laterality: N/A;  . COLONOSCOPY WITH PROPOFOL N/A 12/03/2016   Procedure: COLONOSCOPY WITH PROPOFOL;  Surgeon: [name], MD;  Location: [facility] ENDOSCOPY;  Service: Endoscopy;  Laterality: N/A;  . ESOPHAGOGASTRODUODENOSCOPY (EGD) WITH PROPOFOL N/A 12/03/2016   Procedure: ESOPHAGOGASTRODUODENOSCOPY (EGD) WITH PROPOFOL;  Surgeon: [name], MD;  Location: [facility] ENDOSCOPY;  Service: Endoscopy;  Laterality: N/A;  . FINGER SURGERY Right 2012   index finger  . GASTRIC BYPASS    . GIVENS CAPSULE STUDY N/A 01/07/2017   Procedure: GIVENS CAPSULE STUDY;  Surgeon: [name], MD;  Location: [facility] ENDOSCOPY;  Service: Endoscopy;  Laterality: N/A;  . LEFT HEART CATH AND CORONARY ANGIOGRAPHY N/A 12/04/2016   Procedure: Left Heart Cath and Coronary Angiography;  Surgeon: [name], MD;   Location: [facility];  Service: Cardiovascular;  Laterality: N/A;  . SHOULDER ARTHROSCOPY WITH OPEN ROTATOR CUFF REPAIR Left 10/04/2017   Procedure: SHOULDER ARTHROSCOPY WITH ROTATOR CUFF REPAIR, SUBACROMIAL DECOMPRESSION,OPEN BICEP TENODESIS, EXTENSIVE DEBRIDEMENT;  Surgeon: [name], MD;  Location: [facility] ORS;  Service: Orthopedics;  Laterality: Left;    Social History   Socioeconomic History  . Marital status: Married    Spouse name: Not on file  . Number of children: Not on file  . Years of education: Not on file  . Highest education level: Not on file  Occupational History  . Occupation: Teacher, early years/pre, Brewing technologist: [employer]  . Occupation: disability  Tobacco Use  . Smoking status: Never Smoker  . Smokeless tobacco: Never Used  Substance and Sexual Activity  . Alcohol use: Yes    Alcohol/week: 4.0 standard drinks    Types: 4 Glasses of wine per week  . Drug use: No  . Sexual activity: Yes  Other Topics Concern  . Not on file  Social History Narrative   Regular exercise-yes, walking one mile per day   Diet: fast food, diet soda, unsweeted tea   Social Determinants of Health   Financial Resource Strain:   . Difficulty  of Paying Living Expenses:   Food Insecurity:   . Worried About Charity fundraiser in the Last Year:   . Arboriculturist in the Last Year:   Transportation Needs:   . Film/video editor (Medical):   Marland Kitchen Lack of Transportation (Non-Medical):   Physical Activity:   . Days of Exercise per Week:   . Minutes of Exercise per Session:   Stress:   . Feeling of Stress :   Social Connections:   . Frequency of Communication with Friends and Family:   . Frequency of Social Gatherings with Friends and Family:   . Attends Religious Services:   . Active Member of Clubs or Organizations:   . Attends Archivist Meetings:   Marland Kitchen Marital Status:   Intimate Partner Violence:   . Fear of Current or Ex-Partner:   .  Emotionally Abused:   Marland Kitchen Physically Abused:   . Sexually Abused:     Family History  Problem Relation Age of Onset  . Diabetes Mother   . Hypertension Mother   . Cancer Mother        breast  . Diabetes Father   . Hypertension Father   . Cancer Father        colon  . Cancer Maternal Grandfather        prostate  . Stroke Maternal Grandfather        CVA     Current Outpatient Medications:  .  calcium carbonate (OS-CAL) 600 MG TABS tablet, Take 1,200 mg by mouth daily with breakfast. , Disp: , Rfl:  .  carvedilol (COREG) 12.5 MG tablet, TAKE 1 TABLET BY MOUTH TWICE DAILY, Disp: 180 tablet, Rfl: 3 .  digoxin (LANOXIN) 0.125 MG tablet, Take 1 tablet (125 mcg total) by mouth daily., Disp: 30 tablet, Rfl: 3 .  glucose blood (ONE TOUCH ULTRA TEST) test strip, Check blood sugar once daily and as instructed. Dx E11.9, Disp: 100 each, Rfl: 1 .  ibuprofen (ADVIL,MOTRIN) 600 MG tablet, Take 600 mg by mouth every 6 (six) hours as needed. for pain, Disp: , Rfl: 0 .  Insulin Glargine (BASAGLAR KWIKPEN) 100 UNIT/ML, Inject 0.18 mLs (18 Units total) into the skin daily., Disp: 15 mL, Rfl: 0 .  metFORMIN (GLUCOPHAGE-XR) 500 MG 24 hr tablet, Take 1 tablet (500 mg total) by mouth daily with breakfast., Disp: 90 tablet, Rfl: 0 .  Multiple Vitamins-Minerals (ADULT GUMMY PO), Take 2 tablets by mouth daily., Disp: , Rfl:  .  Omega-3 Fatty Acids (FISH OIL PO), Take 800 mg by mouth daily., Disp: , Rfl:  .  omeprazole (PRILOSEC) 40 MG capsule, Take 1 capsule (40 mg total) by mouth daily as needed (indigestion)., Disp: 90 capsule, Rfl: 0 .  sacubitril-valsartan (ENTRESTO) 97-103 MG, Take 1 tablet by mouth 2 (two) times daily., Disp: 180 tablet, Rfl: 3 .  spironolactone (ALDACTONE) 25 MG tablet, Take 1 tablet (25 mg total) by mouth daily., Disp: 90 tablet, Rfl: 3  No results found.  No images are attached to the encounter.   CMP Latest Ref Rng & Units 07/10/2019  Glucose 70 - 99 mg/dL 175(H)  BUN 6 - 23  mg/dL 15  Creatinine 0.40 - 1.50 mg/dL 0.94  Sodium 135 - 145 mEq/L 137  Potassium 3.5 - 5.1 mEq/L 4.3  Chloride 96 - 112 mEq/L 102  CO2 19 - 32 mEq/L 30  Calcium 8.4 - 10.5 mg/dL 9.0  Total Protein 6.0 - 8.3 g/dL 7.2  Total Bilirubin  0.2 - 1.2 mg/dL 0.6  Alkaline Phos 39 - 117 U/L 65  AST 0 - 37 U/L 15  ALT 0 - 53 U/L 14   CBC Latest Ref Rng & Units 12/18/2019  WBC 4.0 - 10.5 K/uL 4.3  Hemoglobin 13.0 - 17.0 g/dL 12.4(L)  Hematocrit 39.0 - 52.0 % 38.8(L)  Platelets 150 - 400 K/uL 217     Observation/objective: Appears in no acute distress over video visit today.  Breathing is nonlabored  Assessment and plan: Patient is a 49 year old male with history of iron deficiency anemia possibly secondary to gastric bypass  Patient's hemoglobin has remained stable between 11-12 for the last 2 years.  Ferritin level is 58 and iron studies are normal.  He does not require any IV iron at this time.  Given the stability of his hemoglobin I would like to check a CBC ferritin and iron studies in 6 months in 1 year and see him back in 1 year  Follow-up instructions: As above  I discussed the assessment and treatment plan with the patient. The patient was provided an opportunity to ask questions and all were answered. The patient agreed with the plan and demonstrated an understanding of the instructions.   The patient was advised to call back or seek an in-person evaluation if the symptoms worsen or if the condition fails to improve as anticipated.  Visit Diagnosis: 1. Iron deficiency anemia, unspecified iron deficiency anemia type     Dr. Randa Evens, MD, MPH [facility] at [facility] Tel- [id number] 12/24/2019 11:27 AM

## 2019-12-24 NOTE — Progress Notes (Deleted)
Hematology/Oncology Consult note [facility]  [phone] [phone]  Patient Care Team: [name], MD as PCP - General End, Harrell Gave, MD as PCP - Cardiology (Cardiology) Alisa Graff, FNP as Nurse Practitioner (Family Medicine)   Name of the patient: Philip Ponce  [id number]  [birth year]   Date of visit: 12/24/19  Diagnosis-   Chief complaint/ Reason for visit- ***  Heme/Onc history: ***  Interval history- ***  ECOG PS- *** Pain scale- *** Opioid associated constipation- ***  Review of systems- ROS   Current treatment- ***  Allergies  Allergen Reactions  . Mobic [Meloxicam] Other (See Comments)    Ulcers in stomach eruption   . Tramadol     Hard to breath  . Diclofenac Palpitations and Other (See Comments)    Fast heart beat, CP,SOB and weakness on one side.     Past Medical History:  Diagnosis Date  . Acute blood loss anemia 11/20/2016  . AICD (automatic cardioverter/defibrillator) present    a. 06/2017 s/p MDT DTMA 1QQ [place] MRI Quad CRT-D SureScan (ser # [id number]).  . Chronic combined systolic (congestive) and diastolic (congestive) heart failure ([place])    a. 11/2016 Echo: EF 20-25%, glob HK, antsept, ant, apical HK, Gr1 DD, mild MR, mildly dil LA, nl RV fxn; b. 05/2017 Echo: Ef 20-25%, Gr2 DD, prominent apical trabeculations; c. 12/2017 Echo: EF 30-35%, Gr1 DD, glob HK.  . Diabetes mellitus ([place])   . Essential hypertension   . Family history of adverse reaction to anesthesia    mom had a hard time waking up  . GERD (gastroesophageal reflux disease)   . NICM (nonischemic cardiomyopathy) ([place])    a. 11/2016 Cath: nl cors, EF 25-30%; b. 11/2016 Echo: EF 20-25%; c. 05/2017 Echo: EF 20-25%; d. 12/2017 Echo: EF 30-35%, Gr1 DD.  Marland Kitchen Slipped intervertebral disc    L4 L5  . STEMI (ST elevation myocardial infarction) ([place])    a. 11/2016 ST elevation -->Nl cors on cath.     Past Surgical History:  Procedure  Laterality Date  . BIV ICD INSERTION CRT-D  06/26/2017  . BIV ICD INSERTION CRT-D N/A 06/26/2017   Procedure: BIV ICD INSERTION CRT-D;  Surgeon: [name], MD;  Location: [place] [facility];  Service: Cardiovascular;  Laterality: N/A;  . CERVICAL DISC ARTHROPLASTY N/A 07/24/2017   Procedure: CERVICAL ANTERIOR [place] ARTHROPLASTY C5-C7;  Surgeon: [name], MD;  Location: [facility] ORS;  Service: Neurosurgery;  Laterality: N/A;  . COLONOSCOPY WITH PROPOFOL N/A 12/03/2016   Procedure: COLONOSCOPY WITH PROPOFOL;  Surgeon: [name], MD;  Location: [facility] ENDOSCOPY;  Service: Endoscopy;  Laterality: N/A;  . ESOPHAGOGASTRODUODENOSCOPY (EGD) WITH PROPOFOL N/A 12/03/2016   Procedure: ESOPHAGOGASTRODUODENOSCOPY (EGD) WITH PROPOFOL;  Surgeon: [name], MD;  Location: [facility] ENDOSCOPY;  Service: Endoscopy;  Laterality: N/A;  . FINGER SURGERY Right 2012   index finger  . GASTRIC BYPASS    . GIVENS CAPSULE STUDY N/A 01/07/2017   Procedure: GIVENS CAPSULE STUDY;  Surgeon: [name], MD;  Location: [facility] ENDOSCOPY;  Service: Endoscopy;  Laterality: N/A;  . LEFT HEART CATH AND CORONARY ANGIOGRAPHY N/A 12/04/2016   Procedure: Left Heart Cath and Coronary Angiography;  Surgeon: [name], MD;  Location: [place] CV LAB;  Service: Cardiovascular;  Laterality: N/A;  . SHOULDER ARTHROSCOPY WITH OPEN ROTATOR CUFF REPAIR Left 10/04/2017   Procedure: SHOULDER ARTHROSCOPY WITH ROTATOR CUFF REPAIR, SUBACROMIAL DECOMPRESSION,OPEN BICEP TENODESIS, EXTENSIVE DEBRIDEMENT;  Surgeon: [name], MD;  Location: [facility]  ORS;  Service: Orthopedics;  Laterality: Left;    Social History   Socioeconomic History  . Marital status: Married    Spouse name: Not on file  . Number of children: Not on file  . Years of education: Not on file  . Highest education level: Not on file  Occupational History  . Occupation: Teacher, early years/pre, Brewing technologist: [employer]  . Occupation: disability    Tobacco Use  . Smoking status: Never Smoker  . Smokeless tobacco: Never Used  Substance and Sexual Activity  . Alcohol use: Yes    Alcohol/week: 4.0 standard drinks    Types: 4 Glasses of wine per week  . Drug use: No  . Sexual activity: Yes  Other Topics Concern  . Not on file  Social History Narrative   Regular exercise-yes, walking one mile per day   Diet: fast food, diet soda, unsweeted tea   Social Determinants of Health   Financial Resource Strain:   . Difficulty of Paying Living Expenses:   Food Insecurity:   . Worried About Charity fundraiser in the Last Year:   . Arboriculturist in the Last Year:   Transportation Needs:   . Film/video editor (Medical):   Marland Kitchen Lack of Transportation (Non-Medical):   Physical Activity:   . Days of Exercise per Week:   . Minutes of Exercise per Session:   Stress:   . Feeling of Stress :   Social Connections:   . Frequency of Communication with Friends and Family:   . Frequency of Social Gatherings with Friends and Family:   . Attends Religious Services:   . Active Member of Clubs or Organizations:   . Attends Archivist Meetings:   Marland Kitchen Marital Status:   Intimate Partner Violence:   . Fear of Current or Ex-Partner:   . Emotionally Abused:   Marland Kitchen Physically Abused:   . Sexually Abused:     Family History  Problem Relation Age of Onset  . Diabetes Mother   . Hypertension Mother   . Cancer Mother        breast  . Diabetes Father   . Hypertension Father   . Cancer Father        colon  . Cancer Maternal Grandfather        prostate  . Stroke Maternal Grandfather        CVA     Current Outpatient Medications:  .  calcium carbonate (OS-CAL) 600 MG TABS tablet, Take 1,200 mg by mouth daily with breakfast. , Disp: , Rfl:  .  carvedilol (COREG) 12.5 MG tablet, TAKE 1 TABLET BY MOUTH TWICE DAILY, Disp: 180 tablet, Rfl: 3 .  digoxin (LANOXIN) 0.125 MG tablet, Take 1 tablet (125 mcg total) by mouth daily., Disp: 30  tablet, Rfl: 3 .  glucose blood (ONE TOUCH ULTRA TEST) test strip, Check blood sugar once daily and as instructed. Dx E11.9, Disp: 100 each, Rfl: 1 .  ibuprofen (ADVIL,MOTRIN) 600 MG tablet, Take 600 mg by mouth every 6 (six) hours as needed. for pain, Disp: , Rfl: 0 .  Insulin Glargine (BASAGLAR KWIKPEN) 100 UNIT/ML, Inject 0.18 mLs (18 Units total) into the skin daily., Disp: 15 mL, Rfl: 0 .  metFORMIN (GLUCOPHAGE-XR) 500 MG 24 hr tablet, Take 1 tablet (500 mg total) by mouth daily with breakfast., Disp: 90 tablet, Rfl: 0 .  Multiple Vitamins-Minerals (ADULT GUMMY PO), Take 2 tablets by mouth daily., Disp: ,  Rfl:  .  Omega-3 Fatty Acids (FISH OIL PO), Take 800 mg by mouth daily., Disp: , Rfl:  .  omeprazole (PRILOSEC) 40 MG capsule, Take 1 capsule (40 mg total) by mouth daily as needed (indigestion)., Disp: 90 capsule, Rfl: 0 .  sacubitril-valsartan (ENTRESTO) 97-103 MG, Take 1 tablet by mouth 2 (two) times daily., Disp: 180 tablet, Rfl: 3 .  spironolactone (ALDACTONE) 25 MG tablet, Take 1 tablet (25 mg total) by mouth daily., Disp: 90 tablet, Rfl: 3  Physical exam: There were no vitals filed for this visit. Physical Exam   CMP Latest Ref Rng & Units 07/10/2019  Glucose 70 - 99 mg/dL 175(H)  BUN 6 - 23 mg/dL 15  Creatinine 0.40 - 1.50 mg/dL 0.94  Sodium 135 - 145 mEq/L 137  Potassium 3.5 - 5.1 mEq/L 4.3  Chloride 96 - 112 mEq/L 102  CO2 19 - 32 mEq/L 30  Calcium 8.4 - 10.5 mg/dL 9.0  Total Protein 6.0 - 8.3 g/dL 7.2  Total Bilirubin 0.2 - 1.2 mg/dL 0.6  Alkaline Phos 39 - 117 U/L 65  AST 0 - 37 U/L 15  ALT 0 - 53 U/L 14   CBC Latest Ref Rng & Units 12/18/2019  WBC 4.0 - 10.5 K/uL 4.3  Hemoglobin 13.0 - 17.0 g/dL 12.4(L)  Hematocrit 39.0 - 52.0 % 38.8(L)  Platelets 150 - 400 K/uL 217    No images are attached to the encounter.  No results found.   Assessment and plan- Patient is a 49 y.o. male ***   Visit Diagnosis No diagnosis found.   Dr. Randa Evens, MD, MPH [facility] at  [facility] [id number] 12/24/2019 11:26 AM

## 2019-12-28 LAB — HM DIABETES FOOT EXAM

## 2019-12-29 ENCOUNTER — Ambulatory Visit (INDEPENDENT_AMBULATORY_CARE_PROVIDER_SITE_OTHER): Payer: Managed Care, Other (non HMO) | Admitting: *Deleted

## 2019-12-29 DIAGNOSIS — I5042 Chronic combined systolic (congestive) and diastolic (congestive) heart failure: Secondary | ICD-10-CM

## 2019-12-29 LAB — CUP PACEART REMOTE DEVICE CHECK
Battery Remaining Longevity: 67 mo
Battery Voltage: 2.98 V
Brady Statistic AP VP Percent: 0 %
Brady Statistic AP VS Percent: 0 %
Brady Statistic AS VP Percent: 98.73 %
Brady Statistic AS VS Percent: 1.26 %
Brady Statistic RA Percent Paced: 0.01 %
Brady Statistic RV Percent Paced: 81.68 %
Date Time Interrogation Session: 20210406001803
HighPow Impedance: 48 Ohm
Implantable Lead Implant Date: 20181003
Implantable Lead Implant Date: 20181003
Implantable Lead Implant Date: 20181003
Implantable Lead Location: 753858
Implantable Lead Location: 753859
Implantable Lead Location: 753860
Implantable Lead Model: 4398
Implantable Lead Model: 5076
Implantable Pulse Generator Implant Date: 20181003
Lead Channel Impedance Value: 165.029
Lead Channel Impedance Value: 172.541
Lead Channel Impedance Value: 172.541
Lead Channel Impedance Value: 189.525
Lead Channel Impedance Value: 189.525
Lead Channel Impedance Value: 247 Ohm
Lead Channel Impedance Value: 304 Ohm
Lead Channel Impedance Value: 304 Ohm
Lead Channel Impedance Value: 361 Ohm
Lead Channel Impedance Value: 399 Ohm
Lead Channel Impedance Value: 399 Ohm
Lead Channel Impedance Value: 399 Ohm
Lead Channel Impedance Value: 513 Ohm
Lead Channel Impedance Value: 589 Ohm
Lead Channel Impedance Value: 589 Ohm
Lead Channel Impedance Value: 589 Ohm
Lead Channel Impedance Value: 665 Ohm
Lead Channel Impedance Value: 665 Ohm
Lead Channel Pacing Threshold Amplitude: 0.625 V
Lead Channel Pacing Threshold Amplitude: 0.75 V
Lead Channel Pacing Threshold Amplitude: 1.25 V
Lead Channel Pacing Threshold Pulse Width: 0.4 ms
Lead Channel Pacing Threshold Pulse Width: 0.4 ms
Lead Channel Pacing Threshold Pulse Width: 0.4 ms
Lead Channel Sensing Intrinsic Amplitude: 2 mV
Lead Channel Sensing Intrinsic Amplitude: 2 mV
Lead Channel Sensing Intrinsic Amplitude: 8.125 mV
Lead Channel Sensing Intrinsic Amplitude: 8.125 mV
Lead Channel Setting Pacing Amplitude: 1.5 V
Lead Channel Setting Pacing Amplitude: 2 V
Lead Channel Setting Pacing Amplitude: 2.5 V
Lead Channel Setting Pacing Pulse Width: 0.4 ms
Lead Channel Setting Pacing Pulse Width: 0.4 ms
Lead Channel Setting Sensing Sensitivity: 0.3 mV

## 2019-12-30 NOTE — Progress Notes (Signed)
ICD Remote  

## 2020-01-05 DIAGNOSIS — E7800 Pure hypercholesterolemia, unspecified: Secondary | ICD-10-CM | POA: Diagnosis not present

## 2020-01-05 DIAGNOSIS — Z79899 Other long term (current) drug therapy: Secondary | ICD-10-CM | POA: Diagnosis not present

## 2020-01-05 DIAGNOSIS — R0600 Dyspnea, unspecified: Secondary | ICD-10-CM | POA: Diagnosis not present

## 2020-01-05 DIAGNOSIS — E119 Type 2 diabetes mellitus without complications: Secondary | ICD-10-CM | POA: Diagnosis not present

## 2020-01-05 DIAGNOSIS — M25561 Pain in right knee: Secondary | ICD-10-CM | POA: Diagnosis not present

## 2020-01-05 DIAGNOSIS — G4733 Obstructive sleep apnea (adult) (pediatric): Secondary | ICD-10-CM | POA: Diagnosis not present

## 2020-01-05 DIAGNOSIS — I10 Essential (primary) hypertension: Secondary | ICD-10-CM | POA: Diagnosis not present

## 2020-01-05 DIAGNOSIS — Z95810 Presence of automatic (implantable) cardiac defibrillator: Secondary | ICD-10-CM | POA: Diagnosis not present

## 2020-01-05 DIAGNOSIS — I5042 Chronic combined systolic (congestive) and diastolic (congestive) heart failure: Secondary | ICD-10-CM | POA: Diagnosis not present

## 2020-01-05 DIAGNOSIS — I428 Other cardiomyopathies: Secondary | ICD-10-CM | POA: Diagnosis not present

## 2020-01-11 ENCOUNTER — Ambulatory Visit: Payer: MEDICARE | Attending: Internal Medicine

## 2020-01-11 DIAGNOSIS — Z23 Encounter for immunization: Secondary | ICD-10-CM

## 2020-01-11 NOTE — Progress Notes (Signed)
   Covid-19 Vaccination Clinic  Name:  Philip Ponce    MRN: [id number] DOB: 11-Sep-1971  01/11/2020  [name] was observed post Covid-19 immunization for 15 minutes without incident. He was provided with Vaccine Information Sheet and instruction to access the V-Safe system.   [name] was instructed to call 911 with any severe reactions post vaccine: Marland Kitchen Difficulty breathing  . Swelling of face and throat  . A fast heartbeat  . A bad rash all over body  . Dizziness and weakness   Immunizations Administered    Name Date Dose VIS Date Route   Pfizer COVID-19 Vaccine 01/11/2020  2:23 PM 0.3 mL 11/18/2018 Intramuscular   Manufacturer: [place]   Lot: [id number]   [place]: [id number]

## 2020-01-21 ENCOUNTER — Encounter: Payer: Self-pay | Admitting: Family Medicine

## 2020-01-21 ENCOUNTER — Ambulatory Visit (INDEPENDENT_AMBULATORY_CARE_PROVIDER_SITE_OTHER): Payer: Medicare HMO | Admitting: Family Medicine

## 2020-01-21 ENCOUNTER — Other Ambulatory Visit: Payer: Self-pay

## 2020-01-21 VITALS — BP 138/84 | HR 76 | Temp 97.3°F | Ht 73.0 in | Wt 197.2 lb

## 2020-01-21 DIAGNOSIS — I5042 Chronic combined systolic (congestive) and diastolic (congestive) heart failure: Secondary | ICD-10-CM | POA: Diagnosis not present

## 2020-01-21 DIAGNOSIS — E119 Type 2 diabetes mellitus without complications: Secondary | ICD-10-CM

## 2020-01-21 DIAGNOSIS — I10 Essential (primary) hypertension: Secondary | ICD-10-CM | POA: Diagnosis not present

## 2020-01-21 DIAGNOSIS — Z794 Long term (current) use of insulin: Secondary | ICD-10-CM | POA: Diagnosis not present

## 2020-01-21 DIAGNOSIS — E7800 Pure hypercholesterolemia, unspecified: Secondary | ICD-10-CM

## 2020-01-21 DIAGNOSIS — I428 Other cardiomyopathies: Secondary | ICD-10-CM

## 2020-01-21 LAB — POCT GLYCOSYLATED HEMOGLOBIN (HGB A1C): Hemoglobin A1C: 6.1 % — AB (ref 4.0–5.6)

## 2020-01-21 NOTE — Patient Instructions (Addendum)
Set up yearly eye exam for diabetes and have the opthalmologist send [place] a copy of the evaluation for the chart. If feeling like sugar low... check blood sugar.

## 2020-01-21 NOTE — Assessment & Plan Note (Signed)
Good control on metformin and Balsalgar.  Instructed pt on correct way to manage low blood sugar as well as encouraged him to check blood sugar when he thinks he is low.

## 2020-01-21 NOTE — Assessment & Plan Note (Signed)
Well controlled. Continue current medication. Encouraged exercise, weight loss, healthy eating habits.  

## 2020-01-21 NOTE — Assessment & Plan Note (Signed)
Well controlled. Continue current medication.  

## 2020-01-21 NOTE — Progress Notes (Signed)
Chief Complaint  Patient presents with  . 6 mos FU    History of Present Illness: HPI  49 year old male with history of  DM, HTN, high chol and cardiomyopathy presents for 6 month DM follow up.  Diabetes:   Stable control of A1C on Balsalgar and metformin. HgA1C 6.1 Using medications without difficulties: Hypoglycemic episodes:   Rarely if overexert or skips meal.. he feels shaky but does not check it. Hyperglycemic episodes:none Feet problems: no ulcers Blood Sugars averaging:  Not checking. eye exam within last year:  Last cholesterol check in 06/2019 at goal on current regimen.. no need for re-check yet.  Hypertension:   BP at goal on current regimen.  BP Readings from Last 3 Encounters:  01/21/20 138/84  07/23/19 120/90  05/12/19 126/82  Using medication without problems or lightheadedness: none Chest pain with exertion:none Edema: he is euvolemic in office today Short of breath: stable Average home BPs: Other issues: Nonischemic cardiomyopathy and diastolic and systolic heart failure followed by cardiology.  Reviewed in detail last OV note From  01/05/2020 Dr. Clayborn Bigness and Ms. White, NP.  He has been feeling less fatigued on entreto.  This visit occurred during the SARS-CoV-2 public health emergency.  Safety protocols were in place, including screening questions prior to the visit, additional usage of staff PPE, and extensive cleaning of exam room while observing appropriate contact time as indicated for disinfecting solutions.   COVID 19 screen:  No recent travel or known exposure to COVID19 The patient denies respiratory symptoms of COVID 19 at this time. The importance of social distancing was discussed today.     Review of Systems  Constitutional: Negative for chills and fever.  HENT: Negative for congestion and ear pain.   Eyes: Negative for pain and redness.  Respiratory: Negative for cough and shortness of breath.   Cardiovascular: Negative for chest pain,  palpitations and leg swelling.  Gastrointestinal: Negative for abdominal pain, blood in stool, constipation, diarrhea, nausea and vomiting.  Genitourinary: Negative for dysuria.  Musculoskeletal: Negative for falls and myalgias.  Skin: Negative for rash.  Neurological: Negative for dizziness.  Psychiatric/Behavioral: Negative for depression. The patient is not nervous/anxious.       Past Medical History:  Diagnosis Date  . Acute blood loss anemia 11/20/2016  . AICD (automatic cardioverter/defibrillator) present    a. 06/2017 s/p MDT DTMA 1QQ [facility] MRI Quad CRT-D SureScan (ser # [id number]).  . Chronic combined systolic (congestive) and diastolic (congestive) heart failure ([place])    a. 11/2016 Echo: EF 20-25%, glob HK, antsept, ant, apical HK, Gr1 DD, mild MR, mildly dil LA, nl RV fxn; b. 05/2017 Echo: Ef 20-25%, Gr2 DD, prominent apical trabeculations; c. 12/2017 Echo: EF 30-35%, Gr1 DD, glob HK.  . Diabetes mellitus ([place])   . Essential hypertension   . Family history of adverse reaction to anesthesia    mom had a hard time waking up  . GERD (gastroesophageal reflux disease)   . NICM (nonischemic cardiomyopathy) ([place])    a. 11/2016 Cath: nl cors, EF 25-30%; b. 11/2016 Echo: EF 20-25%; c. 05/2017 Echo: EF 20-25%; d. 12/2017 Echo: EF 30-35%, Gr1 DD.  Marland Kitchen Slipped intervertebral disc    L4 L5  . STEMI (ST elevation myocardial infarction) ([name])    a. 11/2016 ST elevation -->Nl cors on cath.    reports that he has never smoked. He has never used smokeless tobacco. He reports current alcohol use of about 4.0 standard drinks of alcohol per week. He  reports that he does not use drugs.   Current Outpatient Medications:  .  calcium carbonate (OS-CAL) 600 MG TABS tablet, Take 1,200 mg by mouth daily with breakfast. , Disp: , Rfl:  .  carvedilol (COREG) 12.5 MG tablet, TAKE 1 TABLET BY MOUTH TWICE DAILY, Disp: 180 tablet, Rfl: 3 .  digoxin (LANOXIN) 0.125 MG tablet, Take 1 tablet (125 mcg total) by mouth  daily., Disp: 30 tablet, Rfl: 3 .  glucose blood (ONE TOUCH ULTRA TEST) test strip, Check blood sugar once daily and as instructed. Dx E11.9, Disp: 100 each, Rfl: 1 .  ibuprofen (ADVIL,MOTRIN) 600 MG tablet, Take 600 mg by mouth every 6 (six) hours as needed. for pain, Disp: , Rfl: 0 .  Insulin Glargine (BASAGLAR KWIKPEN) 100 UNIT/ML, Inject 0.18 mLs (18 Units total) into the skin daily., Disp: 15 mL, Rfl: 0 .  metFORMIN (GLUCOPHAGE-XR) 500 MG 24 hr tablet, Take 1 tablet (500 mg total) by mouth daily with breakfast., Disp: 90 tablet, Rfl: 0 .  Multiple Vitamins-Minerals (ADULT GUMMY PO), Take 2 tablets by mouth daily., Disp: , Rfl:  .  Omega-3 Fatty Acids (FISH OIL PO), Take 800 mg by mouth daily., Disp: , Rfl:  .  omeprazole (PRILOSEC) 40 MG capsule, Take 1 capsule (40 mg total) by mouth daily as needed (indigestion)., Disp: 90 capsule, Rfl: 0 .  sacubitril-valsartan (ENTRESTO) 97-103 MG, Take 1 tablet by mouth 2 (two) times daily., Disp: 180 tablet, Rfl: 3 .  spironolactone (ALDACTONE) 25 MG tablet, Take 1 tablet (25 mg total) by mouth daily., Disp: 90 tablet, Rfl: 3   Observations/Objective: Blood pressure 138/84, pulse 76, temperature (!) 97.3 F (36.3 C), temperature source Temporal, height 6\' 1"  (1.854 m), weight 197 lb 4 oz (89.5 kg), SpO2 96 %.  Physical Exam Constitutional:      Appearance: He is well-developed.  HENT:     Head: Normocephalic.     Right Ear: Hearing normal.     Left Ear: Hearing normal.     Nose: Nose normal.  Neck:     Thyroid: No thyroid mass or thyromegaly.     Vascular: No carotid bruit.     Trachea: Trachea normal.  Cardiovascular:     Rate and Rhythm: Normal rate and regular rhythm.     Pulses: Normal pulses.     Heart sounds: Heart sounds not distant. No murmur. No friction rub. No gallop.      Comments: No peripheral edema Pulmonary:     Effort: Pulmonary effort is normal. No respiratory distress.     Breath sounds: Normal breath sounds.  Skin:     General: Skin is warm and dry.     Findings: No rash.  Psychiatric:        Speech: Speech normal.        Behavior: Behavior normal.        Thought Content: Thought content normal.     Diabetic foot exam: Normal inspection No skin breakdown No calluses  Normal DP pulses Normal sensation to light touch and monofilament Nails normal  Assessment and Plan    Type 2 diabetes mellitus without complication, with long-term current use of insulin (HCC)  Good control on metformin and Balsalgar.  Instructed pt on correct way to manage low blood sugar as well as encouraged him to check blood sugar when he thinks he is low.  HYPERCHOLESTEROLEMIA, PURE Well controlled. Continue current medication. Encouraged exercise, weight loss, healthy eating habits.   Non-ischemic cardiomyopathy ([place])  Follwoed  by cardiology.  Chronic combined systolic and diastolic heart failure ([place]) Euvolemic toady in office.  Essential hypertension, benign Well controlled. Continue current medication.     Eliezer Lofts, MD

## 2020-01-21 NOTE — Assessment & Plan Note (Signed)
Follwoed by cardiology.

## 2020-01-21 NOTE — Assessment & Plan Note (Signed)
Euvolemic toady in office.

## 2020-01-26 DIAGNOSIS — K08409 Partial loss of teeth, unspecified cause, unspecified class: Secondary | ICD-10-CM | POA: Diagnosis not present

## 2020-01-26 DIAGNOSIS — I509 Heart failure, unspecified: Secondary | ICD-10-CM | POA: Diagnosis not present

## 2020-01-26 DIAGNOSIS — I2510 Atherosclerotic heart disease of native coronary artery without angina pectoris: Secondary | ICD-10-CM | POA: Diagnosis not present

## 2020-01-26 DIAGNOSIS — Z794 Long term (current) use of insulin: Secondary | ICD-10-CM | POA: Diagnosis not present

## 2020-01-26 DIAGNOSIS — G8929 Other chronic pain: Secondary | ICD-10-CM | POA: Diagnosis not present

## 2020-01-26 DIAGNOSIS — E119 Type 2 diabetes mellitus without complications: Secondary | ICD-10-CM | POA: Diagnosis not present

## 2020-01-26 DIAGNOSIS — E261 Secondary hyperaldosteronism: Secondary | ICD-10-CM | POA: Diagnosis not present

## 2020-01-26 DIAGNOSIS — I252 Old myocardial infarction: Secondary | ICD-10-CM | POA: Diagnosis not present

## 2020-01-26 DIAGNOSIS — H547 Unspecified visual loss: Secondary | ICD-10-CM | POA: Diagnosis not present

## 2020-01-26 DIAGNOSIS — I110 Hypertensive heart disease with heart failure: Secondary | ICD-10-CM | POA: Diagnosis not present

## 2020-01-27 DIAGNOSIS — H02823 Cysts of right eye, unspecified eyelid: Secondary | ICD-10-CM | POA: Diagnosis not present

## 2020-01-27 DIAGNOSIS — I10 Essential (primary) hypertension: Secondary | ICD-10-CM | POA: Diagnosis not present

## 2020-01-27 DIAGNOSIS — H35033 Hypertensive retinopathy, bilateral: Secondary | ICD-10-CM | POA: Diagnosis not present

## 2020-01-27 DIAGNOSIS — H524 Presbyopia: Secondary | ICD-10-CM | POA: Diagnosis not present

## 2020-01-27 DIAGNOSIS — Z135 Encounter for screening for eye and ear disorders: Secondary | ICD-10-CM | POA: Diagnosis not present

## 2020-01-27 DIAGNOSIS — Z0100 Encounter for examination of eyes and vision without abnormal findings: Secondary | ICD-10-CM | POA: Diagnosis not present

## 2020-01-27 DIAGNOSIS — E119 Type 2 diabetes mellitus without complications: Secondary | ICD-10-CM | POA: Diagnosis not present

## 2020-03-05 ENCOUNTER — Other Ambulatory Visit: Payer: Self-pay | Admitting: Family Medicine

## 2020-03-09 DIAGNOSIS — R69 Illness, unspecified: Secondary | ICD-10-CM | POA: Diagnosis not present

## 2020-03-18 DIAGNOSIS — D485 Neoplasm of uncertain behavior of skin: Secondary | ICD-10-CM | POA: Diagnosis not present

## 2020-03-28 ENCOUNTER — Other Ambulatory Visit: Payer: Self-pay | Admitting: Family Medicine

## 2020-03-29 ENCOUNTER — Ambulatory Visit (INDEPENDENT_AMBULATORY_CARE_PROVIDER_SITE_OTHER): Payer: Medicare HMO | Admitting: *Deleted

## 2020-03-29 DIAGNOSIS — I428 Other cardiomyopathies: Secondary | ICD-10-CM | POA: Diagnosis not present

## 2020-03-29 LAB — CUP PACEART REMOTE DEVICE CHECK
Battery Remaining Longevity: 61 mo
Battery Voltage: 2.97 V
Brady Statistic AP VP Percent: 0.01 %
Brady Statistic AP VS Percent: 0 %
Brady Statistic AS VP Percent: 98.75 %
Brady Statistic AS VS Percent: 1.24 %
Brady Statistic RA Percent Paced: 0.01 %
Brady Statistic RV Percent Paced: 85.87 %
Date Time Interrogation Session: 20210706012204
HighPow Impedance: 52 Ohm
Implantable Lead Implant Date: 20181003
Implantable Lead Implant Date: 20181003
Implantable Lead Implant Date: 20181003
Implantable Lead Location: 753858
Implantable Lead Location: 753859
Implantable Lead Location: 753860
Implantable Lead Model: 4398
Implantable Lead Model: 5076
Implantable Pulse Generator Implant Date: 20181003
Lead Channel Impedance Value: 172.541
Lead Channel Impedance Value: 176 Ohm
Lead Channel Impedance Value: 182.4 Ohm
Lead Channel Impedance Value: 204.14 Ohm
Lead Channel Impedance Value: 212.8 Ohm
Lead Channel Impedance Value: 285 Ohm
Lead Channel Impedance Value: 304 Ohm
Lead Channel Impedance Value: 342 Ohm
Lead Channel Impedance Value: 399 Ohm
Lead Channel Impedance Value: 418 Ohm
Lead Channel Impedance Value: 418 Ohm
Lead Channel Impedance Value: 456 Ohm
Lead Channel Impedance Value: 513 Ohm
Lead Channel Impedance Value: 608 Ohm
Lead Channel Impedance Value: 646 Ohm
Lead Channel Impedance Value: 665 Ohm
Lead Channel Impedance Value: 722 Ohm
Lead Channel Impedance Value: 722 Ohm
Lead Channel Pacing Threshold Amplitude: 0.625 V
Lead Channel Pacing Threshold Amplitude: 0.625 V
Lead Channel Pacing Threshold Amplitude: 1.25 V
Lead Channel Pacing Threshold Pulse Width: 0.4 ms
Lead Channel Pacing Threshold Pulse Width: 0.4 ms
Lead Channel Pacing Threshold Pulse Width: 0.4 ms
Lead Channel Sensing Intrinsic Amplitude: 2 mV
Lead Channel Sensing Intrinsic Amplitude: 2 mV
Lead Channel Sensing Intrinsic Amplitude: 9.5 mV
Lead Channel Sensing Intrinsic Amplitude: 9.5 mV
Lead Channel Setting Pacing Amplitude: 1.5 V
Lead Channel Setting Pacing Amplitude: 2 V
Lead Channel Setting Pacing Amplitude: 2.5 V
Lead Channel Setting Pacing Pulse Width: 0.4 ms
Lead Channel Setting Pacing Pulse Width: 0.4 ms
Lead Channel Setting Sensing Sensitivity: 0.3 mV

## 2020-03-30 NOTE — Progress Notes (Signed)
Remote ICD transmission.   

## 2020-03-31 ENCOUNTER — Telehealth: Payer: Self-pay | Admitting: Cardiology

## 2020-03-31 ENCOUNTER — Telehealth: Payer: Self-pay

## 2020-03-31 ENCOUNTER — Other Ambulatory Visit: Payer: Self-pay

## 2020-03-31 ENCOUNTER — Ambulatory Visit (INDEPENDENT_AMBULATORY_CARE_PROVIDER_SITE_OTHER): Payer: Medicare HMO | Admitting: Emergency Medicine

## 2020-03-31 DIAGNOSIS — I428 Other cardiomyopathies: Secondary | ICD-10-CM | POA: Diagnosis not present

## 2020-03-31 LAB — CUP PACEART INCLINIC DEVICE CHECK
Battery Remaining Longevity: 60 mo
Battery Voltage: 2.96 V
Brady Statistic AP VP Percent: 0.01 %
Brady Statistic AP VS Percent: 0 %
Brady Statistic AS VP Percent: 98.73 %
Brady Statistic AS VS Percent: 1.26 %
Brady Statistic RA Percent Paced: 0.01 %
Brady Statistic RV Percent Paced: 81.12 %
Date Time Interrogation Session: 20210708144400
HighPow Impedance: 55 Ohm
Implantable Lead Implant Date: 20181003
Implantable Lead Implant Date: 20181003
Implantable Lead Implant Date: 20181003
Implantable Lead Location: 753858
Implantable Lead Location: 753859
Implantable Lead Location: 753860
Implantable Lead Model: 4398
Implantable Lead Model: 5076
Implantable Pulse Generator Implant Date: 20181003
Lead Channel Impedance Value: 165.029
Lead Channel Impedance Value: 172.541
Lead Channel Impedance Value: 176 Ohm
Lead Channel Impedance Value: 193.707
Lead Channel Impedance Value: 204.14 Ohm
Lead Channel Impedance Value: 285 Ohm
Lead Channel Impedance Value: 304 Ohm
Lead Channel Impedance Value: 342 Ohm
Lead Channel Impedance Value: 361 Ohm
Lead Channel Impedance Value: 399 Ohm
Lead Channel Impedance Value: 418 Ohm
Lead Channel Impedance Value: 456 Ohm
Lead Channel Impedance Value: 456 Ohm
Lead Channel Impedance Value: 589 Ohm
Lead Channel Impedance Value: 608 Ohm
Lead Channel Impedance Value: 608 Ohm
Lead Channel Impedance Value: 646 Ohm
Lead Channel Impedance Value: 665 Ohm
Lead Channel Pacing Threshold Amplitude: 0.5 V
Lead Channel Pacing Threshold Amplitude: 1.25 V
Lead Channel Pacing Threshold Amplitude: 1.25 V
Lead Channel Pacing Threshold Pulse Width: 0.4 ms
Lead Channel Pacing Threshold Pulse Width: 0.4 ms
Lead Channel Pacing Threshold Pulse Width: 0.4 ms
Lead Channel Sensing Intrinsic Amplitude: 2.75 mV
Lead Channel Sensing Intrinsic Amplitude: 7.5 mV
Lead Channel Setting Pacing Amplitude: 1.5 V
Lead Channel Setting Pacing Amplitude: 2 V
Lead Channel Setting Pacing Amplitude: 2.5 V
Lead Channel Setting Pacing Pulse Width: 0.4 ms
Lead Channel Setting Pacing Pulse Width: 0.4 ms
Lead Channel Setting Sensing Sensitivity: 0.3 mV

## 2020-03-31 NOTE — Progress Notes (Signed)
CRT-D device check in office. Thresholds and sensing consistent with previous device measurements. Lead impedance trends stable over time. No mode switch episodes recorded. No ventricular arrhythmia episodes recorded. Patient bi-ventricularly pacing 98.48% of the time. Device programmed with appropriate safety margins. Heart failure diagnostics reviewed and trends are stable for patient. No changes made this session. Estimated longevity 5 yrs.  Patient enrolled in remote follow up. Plan to check device remotely 06/28/20 and every 3 months. Patient education completed including shock plan. Patient scheduled for follow up with Charlcie Cradle PA 04/12/20 due to being overdue for 1 year follow up.

## 2020-03-31 NOTE — Telephone Encounter (Signed)
-----   Message from [name], MD sent at 03/31/2020  1:15 PM EDT ----- Regarding: RE: Patient Request As it sounds like his primary complaint is device related, I suggest that Mr. Chachere f/u with the device clinic and/or Dr. Clayborn Bigness (not part of our practice), whom he now follows with from a general cardiology standpoint.  Gerald Stabs ----- Message ----- From: Dion Body, MD Sent: 03/31/2020   5:45 AM EDT To: [name], MD, Candis Schatz Subject: Patient Request                                Good Morning Mardene Celeste,  Mr. Radermacher called last night and was requesting cards OP appt. He has medicine OP appt already scheduled for 07/14 but wanted to see if any sooner available with cards. Reporting "visible vein above pacemaker (implanted back in 06/2017) and some pain when he lifts arm along with generalized fatigue. No swelling/erythema/warmth around pocket. I do not think it is urgent based off our phone call but told him I would pass along the message.   Thanks,  [facility]

## 2020-03-31 NOTE — Telephone Encounter (Signed)
Patient reports swelling in area of ICD site. No pain, drainage or redness. Edema above and over ICD site. No fever or chills. Will come in to be seen today.

## 2020-03-31 NOTE — Telephone Encounter (Signed)
Philip Ponce the patient's wife is calling stating she called twice after hours last night due to swelling at Hung's device sight. She states he also has a "pulling" sensation when he lifts up his arm & the vein in his neck is more swollen than normal. A Physicians Defibrillator Check has been scheduled due to this for the first available on 04/12/20, but Philip Ponce feels Philip Ponce is needing to be seen sooner. Please advise.

## 2020-03-31 NOTE — Telephone Encounter (Signed)
The patient has been triaged and is coming in for a device clinic appointment today.

## 2020-03-31 NOTE — Telephone Encounter (Signed)
Noted  

## 2020-04-06 DIAGNOSIS — M25561 Pain in right knee: Secondary | ICD-10-CM | POA: Diagnosis not present

## 2020-04-06 DIAGNOSIS — Z79899 Other long term (current) drug therapy: Secondary | ICD-10-CM | POA: Diagnosis not present

## 2020-04-06 DIAGNOSIS — I428 Other cardiomyopathies: Secondary | ICD-10-CM | POA: Diagnosis not present

## 2020-04-06 DIAGNOSIS — I5042 Chronic combined systolic (congestive) and diastolic (congestive) heart failure: Secondary | ICD-10-CM | POA: Diagnosis not present

## 2020-04-06 DIAGNOSIS — G4733 Obstructive sleep apnea (adult) (pediatric): Secondary | ICD-10-CM | POA: Diagnosis not present

## 2020-04-06 DIAGNOSIS — Z95810 Presence of automatic (implantable) cardiac defibrillator: Secondary | ICD-10-CM | POA: Diagnosis not present

## 2020-04-06 DIAGNOSIS — E119 Type 2 diabetes mellitus without complications: Secondary | ICD-10-CM | POA: Diagnosis not present

## 2020-04-06 DIAGNOSIS — R0600 Dyspnea, unspecified: Secondary | ICD-10-CM | POA: Diagnosis not present

## 2020-04-06 DIAGNOSIS — I10 Essential (primary) hypertension: Secondary | ICD-10-CM | POA: Diagnosis not present

## 2020-04-06 DIAGNOSIS — E7800 Pure hypercholesterolemia, unspecified: Secondary | ICD-10-CM | POA: Diagnosis not present

## 2020-04-10 NOTE — Progress Notes (Deleted)
Cardiology Office Note Date:  04/10/2020  Patient ID:  Philip Ponce, Philip Ponce 06-22-1971, MRN [id number] PCP:  [name], MD  Cardiologist:  Dr. Clayborn Bigness ([facility]) EP: Dr. Curt Bears  ***refresh   Chief Complaint: *** annual EP visit  History of Present Illness: Philip Ponce is a 49 y.o. male with history of NICM, ICD, DM, OSA, LBBB, HTN, chronic CHF (systolic), obesity s/p bariatic surgery (***), HLD.  He comes in today to be seen for Dr. Curt Bears, last seen by him 05/12/2019, mentions some ongoing diaphragmatic stim from the LV lead, otherwise no complaints and doing well.  LV lead outputs were reduced, though was unclear if he was feeling his actual heart beats true stim  More recently he saw Dr. Clayborn Bigness 04/06/2020, doing well, mentioning some vague swelling near is device, no abnormal findings were charted, mentioned "no signs of infection".   *** site swelling? Pain? *** symptoms *** volume *** meds, CM *** labs, lipids, lytes   Device information MDT CRT-D, implanted 06/26/2017   Past Medical History:  Diagnosis Date  . Acute blood loss anemia 11/20/2016  . AICD (automatic cardioverter/defibrillator) present    a. 06/2017 s/p MDT DTMA 1QQ [facility] MRI Quad CRT-D SureScan (ser # [id number]).  . Chronic combined systolic (congestive) and diastolic (congestive) heart failure ([place])    a. 11/2016 Echo: EF 20-25%, glob HK, antsept, ant, apical HK, Gr1 DD, mild MR, mildly dil LA, nl RV fxn; b. 05/2017 Echo: Ef 20-25%, Gr2 DD, prominent apical trabeculations; c. 12/2017 Echo: EF 30-35%, Gr1 DD, glob HK.  . Diabetes mellitus ([place])   . Essential hypertension   . Family history of adverse reaction to anesthesia    mom had a hard time waking up  . GERD (gastroesophageal reflux disease)   . NICM (nonischemic cardiomyopathy) ([place])    a. 11/2016 Cath: nl cors, EF 25-30%; b. 11/2016 Echo: EF 20-25%; c. 05/2017 Echo: EF 20-25%; d. 12/2017 Echo: EF 30-35%, Gr1 DD.  Marland Kitchen Slipped  intervertebral disc    L4 L5  . STEMI (ST elevation myocardial infarction) ([place])    a. 11/2016 ST elevation -->Nl cors on cath.    Past Surgical History:  Procedure Laterality Date  . BIV ICD INSERTION CRT-D  06/26/2017  . BIV ICD INSERTION CRT-D N/A 06/26/2017   Procedure: BIV ICD INSERTION CRT-D;  Surgeon: [name], MD;  Location: [facility];  Service: Cardiovascular;  Laterality: N/A;  . CERVICAL DISC ARTHROPLASTY N/A 07/24/2017   Procedure: CERVICAL ANTERIOR [place] ARTHROPLASTY C5-C7;  Surgeon: [name], MD;  Location: [facility];  Service: Neurosurgery;  Laterality: N/A;  . COLONOSCOPY WITH PROPOFOL N/A 12/03/2016   Procedure: COLONOSCOPY WITH PROPOFOL;  Surgeon: [name], MD;  Location: [facility];  Service: Endoscopy;  Laterality: N/A;  . ESOPHAGOGASTRODUODENOSCOPY (EGD) WITH PROPOFOL N/A 12/03/2016   Procedure: ESOPHAGOGASTRODUODENOSCOPY (EGD) WITH PROPOFOL;  Surgeon: [name], MD;  Location: [facility];  Service: Endoscopy;  Laterality: N/A;  . FINGER SURGERY Right 2012   index finger  . GASTRIC BYPASS    . GIVENS CAPSULE STUDY N/A 01/07/2017   Procedure: GIVENS CAPSULE STUDY;  Surgeon: [name], MD;  [facility];  Service: Endoscopy;  Laterality: N/A;  . LEFT HEART CATH AND CORONARY ANGIOGRAPHY N/A 12/04/2016   Procedure: Left Heart Cath and Coronary Angiography;  Surgeon: [name], MD;  Location: [facility];  Service: Cardiovascular;  Laterality: N/A;  . SHOULDER ARTHROSCOPY WITH OPEN ROTATOR CUFF REPAIR Left 10/04/2017  Procedure: SHOULDER ARTHROSCOPY WITH ROTATOR CUFF REPAIR, SUBACROMIAL DECOMPRESSION,OPEN BICEP TENODESIS, EXTENSIVE DEBRIDEMENT;  Surgeon: [name], MD;  Location: [facility];  Service: Orthopedics;  Laterality: Left;    Current Outpatient Medications  Medication Sig Dispense Refill  . calcium carbonate (OS-CAL) 600 MG TABS tablet Take 1,200 mg by mouth daily with breakfast.     . carvedilol  (COREG) 12.5 MG tablet TAKE 1 TABLET BY MOUTH TWICE DAILY 180 tablet 3  . digoxin (LANOXIN) 0.125 MG tablet Take 1 tablet (125 mcg total) by mouth daily. 30 tablet 3  . glucose blood (ONE TOUCH ULTRA TEST) test strip Check blood sugar once daily and as instructed. Dx E11.9 100 each 1  . ibuprofen (ADVIL,MOTRIN) 600 MG tablet Take 600 mg by mouth every 6 (six) hours as needed. for pain  0  . Insulin Glargine (BASAGLAR KWIKPEN) 100 UNIT/ML INJECT SUBCUTANEOUSLY 18   UNITS (0.18ML) DAILY 15 mL 1  . metFORMIN (GLUCOPHAGE-XR) 500 MG 24 hr tablet Take 1 tablet (500 mg total) by mouth daily with breakfast. 90 tablet 0  . Multiple Vitamins-Minerals (ADULT GUMMY PO) Take 2 tablets by mouth daily.    . Omega-3 Fatty Acids (FISH OIL PO) Take 800 mg by mouth daily.    Marland Kitchen omeprazole (PRILOSEC) 40 MG capsule TAKE 1 CAPSULE DAILY AS    NEEDED FOR INDIGESTION 90 capsule 1  . sacubitril-valsartan (ENTRESTO) 97-103 MG Take 1 tablet by mouth 2 (two) times daily. 180 tablet 3  . spironolactone (ALDACTONE) 25 MG tablet Take 1 tablet (25 mg total) by mouth daily. 90 tablet 3   No current facility-administered medications for this visit.    Allergies:   Mobic [meloxicam], Tramadol, and Diclofenac   Social History:  The patient  reports that he has never smoked. He has never used smokeless tobacco. He reports current alcohol use of about 4.0 standard drinks of alcohol per week. He reports that he does not use drugs.   Family History:  The patient's family history includes Cancer in his father, maternal grandfather, and mother; Diabetes in his father and mother; Hypertension in his father and mother; Stroke in his maternal grandfather.  ROS:  Please see the history of present illness.  All other systems are reviewed and otherwise negative.   PHYSICAL EXAM: *** VS:  There were no vitals taken for this visit. BMI: There is no height or weight on file to calculate BMI. Well nourished, well developed, in no acute  distress  HEENT: normocephalic, atraumatic  Neck: no JVD, carotid bruits or masses Cardiac:  *** RRR; no significant murmurs, no rubs, or gallops Lungs:  *** CTA b/l, no wheezing, rhonchi or rales  Abd: soft, nontender MS: no deformity or *** atrophy Ext: *** no edema  Skin: warm and dry, no rash Neuro:  No gross deficits appreciated Psych: euthymic mood, full affect  *** ICD site is stable, no tethering or discomfort   EKG:  Done today and reviewed by myself shows ***  ICD interrogation done today and reviewed by myself: ***  01/06/2018: TTE Study Conclusions  - Left ventricle: The cavity size was mildly dilated. Wall  thickness was normal. Systolic function was moderately to  severely reduced. The estimated ejection fraction was in the  range of 30% to 35%. Diffuse hypokinesis. Doppler parameters are  consistent with abnormal left ventricular relaxation (grade 1  diastolic dysfunction).   Impressions:  - Slight improvement in EF since last study.    12/04/2016: [facility] Conclusions: 1. No angiographically  significant coronary artery disease. 2. Moderate to severe LV contractile dysfunction (LVEF 30-35%), consistent with non-ischemic cardiomyopathy. 3. Mildly elevated left ventricular filling pressure.  Recommendations: 1. Admit to hospitalist service for medical optimization of acute on chronic systolic heart failure. 2. Start carvedilol 3.125 mg BID tonight; if blood pressure and renal function allow, consider adding losartan tomorrow morning. 3. Gentle diuresis beginning tomorrow morning. Avoid hydration tonight. 4. TR band protocol.   Recent Labs: 07/10/2019: ALT 14; BUN 15; Creatinine, Ser 0.94; Potassium 4.3; Sodium 137 12/18/2019: Hemoglobin 12.4; Platelets 217  07/10/2019: Cholesterol 123; HDL 53.90; LDL Cholesterol 58; Total CHOL/HDL Ratio 2; Triglycerides 56.0; VLDL 11.2   CrCl cannot be calculated (Patient's most recent lab result is older than the  maximum 21 days allowed.).   Wt Readings from Last 3 Encounters:  01/21/20 197 lb 4 oz (89.5 kg)  07/23/19 192 lb 4 oz (87.2 kg)  05/12/19 192 lb 6.4 oz (87.3 kg)     Other studies reviewed: Additional studies/records reviewed today include: summarized above  ASSESSMENT AND PLAN:  1. CRT-D     ***  2. NICM 3. Chronic CHF (systolic)     ***      C/w Dr. Clayborn Bigness    Disposition: F/u with ***  Current medicines are reviewed at length with the patient today.  The patient did not have any concerns regarding medicines.***  Signed, Tommye Standard, PA-C 04/10/2020 5:08 PM     [facility] [address] [place] [address] [phone] (office)  [phone] (fax)

## 2020-04-12 ENCOUNTER — Encounter: Payer: Medicare HMO | Admitting: Physician Assistant

## 2020-04-28 DIAGNOSIS — E113292 Type 2 diabetes mellitus with mild nonproliferative diabetic retinopathy without macular edema, left eye: Secondary | ICD-10-CM | POA: Diagnosis not present

## 2020-04-28 DIAGNOSIS — H35033 Hypertensive retinopathy, bilateral: Secondary | ICD-10-CM | POA: Diagnosis not present

## 2020-04-28 DIAGNOSIS — I10 Essential (primary) hypertension: Secondary | ICD-10-CM | POA: Diagnosis not present

## 2020-04-28 DIAGNOSIS — E113211 Type 2 diabetes mellitus with mild nonproliferative diabetic retinopathy with macular edema, right eye: Secondary | ICD-10-CM | POA: Diagnosis not present

## 2020-05-09 ENCOUNTER — Other Ambulatory Visit: Payer: Self-pay | Admitting: Family Medicine

## 2020-05-11 ENCOUNTER — Telehealth: Payer: Self-pay | Admitting: *Deleted

## 2020-05-11 MED ORDER — METFORMIN HCL ER 500 MG PO TB24: 500.0000 mg | ORAL_TABLET | Freq: Every day | ORAL | 0 refills | Status: DC

## 2020-05-11 NOTE — Telephone Encounter (Signed)
Patient called stating that he ran out of his Metformin and the script is coming from his mail order pharmacy. Patient stated that it may be a week before he gets it in the mail. Patient requested some samples which he was advised that we do not have samples. Patient is going to call his insurance company and see what they will approve for him to get at a local pharmacy until he gets his in the mail. Patient will call back with information.

## 2020-05-11 NOTE — Telephone Encounter (Signed)
Script sent electronically to [facility]. Patient advised by telephone.

## 2020-05-11 NOTE — Telephone Encounter (Signed)
Patient called back stating that his insurance will cover a weeks worth of his Metformin at no charge for him. Patient stated that it needs to be sent to [facility].

## 2020-06-10 ENCOUNTER — Encounter: Payer: Self-pay | Admitting: *Deleted

## 2020-06-16 DIAGNOSIS — R69 Illness, unspecified: Secondary | ICD-10-CM | POA: Diagnosis not present

## 2020-06-21 ENCOUNTER — Telehealth: Payer: Self-pay | Admitting: *Deleted

## 2020-06-21 NOTE — Telephone Encounter (Signed)
Philip Ponce notified as instructed by telephone.  He has already scheduled an appointment for the booster vaccine next week.

## 2020-06-21 NOTE — Telephone Encounter (Signed)
Call I do not think he qualifies yet for 3rd dose per his problem list.  Currently, CDC is recommending that moderately to severely immunocompromised people receive an additional dose. This includes people who have: Been receiving active cancer treatment for tumors or cancers of the blood Received an organ transplant and are taking medicine to suppress the immune system Received a stem cell transplant within the last 2 years or are taking medicine to suppress the immune system Moderate or severe primary immunodeficiency (such as DiGeorge syndrome, Wiskott-Aldrich syndrome) Advanced or untreated HIV infection Active treatment with high-dose corticosteroids or other drugs that may suppress your immune response

## 2020-06-21 NOTE — Telephone Encounter (Signed)
ddendum,..   I just noted Con[place] recs from today about third dose: The FDA and CDC have approved a booster dose only of the Pfizer vaccine at this time. The CDC offers the following recommendations for people six months after receiving the second dose of the Pfizer vaccine:  People ages 54 and older and residents in long-term care settings should receive it People ages 71 to 64 with underlying medical conditions should receive it People ages 26 to 73 with underlying medical conditions may receive it, based on individual benefits and risks People ages 53 to 33 who are at increased risk of occupational exposure and transmission, such as health care workers, may receive it, based on individual benefits and risks.   He is a candidate for 3rd dose given he had Pfizer in [place]   Have him look at [employer] for schedule.

## 2020-06-21 NOTE — Telephone Encounter (Signed)
Pt left VM at Triage asking if he needs a covid booster and if so when, and where should he get it

## 2020-06-22 ENCOUNTER — Other Ambulatory Visit: Payer: Self-pay

## 2020-06-22 ENCOUNTER — Inpatient Hospital Stay: Payer: Medicare HMO | Attending: Oncology

## 2020-06-22 DIAGNOSIS — D509 Iron deficiency anemia, unspecified: Secondary | ICD-10-CM | POA: Diagnosis present

## 2020-06-22 LAB — FERRITIN: Ferritin: 99 ng/mL (ref 24–336)

## 2020-06-22 LAB — CBC WITH DIFFERENTIAL/PLATELET
Abs Immature Granulocytes: 0.01 10*3/uL (ref 0.00–0.07)
Basophils Absolute: 0 10*3/uL (ref 0.0–0.1)
Basophils Relative: 1 %
Eosinophils Absolute: 0.1 10*3/uL (ref 0.0–0.5)
Eosinophils Relative: 2 %
HCT: 34.3 % — ABNORMAL LOW (ref 39.0–52.0)
Hemoglobin: 11.4 g/dL — ABNORMAL LOW (ref 13.0–17.0)
Immature Granulocytes: 0 %
Lymphocytes Relative: 26 %
Lymphs Abs: 1.4 10*3/uL (ref 0.7–4.0)
MCH: 28.8 pg (ref 26.0–34.0)
MCHC: 33.2 g/dL (ref 30.0–36.0)
MCV: 86.6 fL (ref 80.0–100.0)
Monocytes Absolute: 0.6 10*3/uL (ref 0.1–1.0)
Monocytes Relative: 11 %
Neutro Abs: 3.1 10*3/uL (ref 1.7–7.7)
Neutrophils Relative %: 60 %
Platelets: 257 10*3/uL (ref 150–400)
RBC: 3.96 MIL/uL — ABNORMAL LOW (ref 4.22–5.81)
RDW: 12.6 % (ref 11.5–15.5)
WBC: 5.2 10*3/uL (ref 4.0–10.5)
nRBC: 0 % (ref 0.0–0.2)

## 2020-06-22 LAB — VITAMIN B12: Vitamin B-12: 419 pg/mL (ref 180–914)

## 2020-06-22 LAB — IRON AND TIBC
Iron: 121 ug/dL (ref 45–182)
Saturation Ratios: 48 % — ABNORMAL HIGH (ref 17.9–39.5)
TIBC: 252 ug/dL (ref 250–450)
UIBC: 131 ug/dL

## 2020-06-27 ENCOUNTER — Telehealth: Payer: Self-pay | Admitting: *Deleted

## 2020-06-27 NOTE — Telephone Encounter (Signed)
Iron studies are fine except for midly elevated saturation of 48% which I am not worried about. B12 is fine. His hb typically runs between 11-12. That's where he is presently. In short, nothing to do presently. Continue to monitor

## 2020-06-27 NOTE — Telephone Encounter (Signed)
Call returned to patient and advised of doctor response. He stated "alright, I just wanted to check"

## 2020-06-27 NOTE — Telephone Encounter (Signed)
Patient called stating that he saw his results in [place] and is asking if he needs an appointment Please advise.  Ferritin Order: [id number] Status:  Final result Visible to patient:  Yes (seen) Next appt:  06/28/2020 at 07:25 AM in Cardiology (CVD-[facility] Device Remotes) Dx:  Iron deficiency anemia, unspecified i...  0 Result Notes  Ref Range & Units 5 d ago 6 mo ago  Ferritin 24 - 336 ng/mL 99  58 CM   Comment: Performed at [facility], [address] [place], [place] [address]  Resulting Agency  [facility] CLIN LAB [facility] CLIN LAB      Specimen Collected: 06/22/20 10:45 Last Resulted: 06/22/20 12:00     Lab Flowsheet   Order Details   View Encounter   Lab and Collection Details   Routing   Result History     CM=Additional comments    Result Care Coordination  Patient Communication  Add Comments Seen Back to Top      Other Results from 06/22/2020  Vitamin B12  Status:  Final result Visible to patient:  Yes (seen) Next appt:  06/28/2020 at 07:25 AM in Cardiology (CVD[facility] Device Remotes) Dx:  Iron deficiency anemia, unspecified i... Order: [id number]  0 Result Notes  Ref Range & Units 5 d ago 1 yr ago  Vitamin B-12 180 - 914 pg/mL 419  533 CM   Comment: (NOTE)  This assay is not validated for testing neonatal or  myeloproliferative syndrome specimens for Vitamin B12 levels.  Performed at [facility], [place] [address], [place], [place]  [address]   Resulting Agency  [facility] CLIN LAB [facility] CLIN LAB      Specimen Collected: 06/22/20 10:45 Last Resulted: 06/22/20 16:19     Lab Flowsheet   Order Details   View Encounter   Lab and Collection Details   Routing   Result History     CM=Additional comments    Result Care Coordination  Patient Communication  Add Comments Seen Back to Top        Contains abnormal dataIron and TIBC  Status:  Final result Visible to patient:  Yes (seen) Next appt:  06/28/2020 at 07:25 AM in Cardiology  (CVD-[facility] Device Remotes) Dx:  Iron deficiency anemia, unspecified i... Order: [id number]  0 Result Notes  Ref Range & Units 5 d ago 6 mo ago  Iron 45 - 182 ug/dL 121  94   TIBC 250 - 450 ug/dL 252  276   Saturation Ratios 17.9 - 39.5 % 48High  34   UIBC ug/dL 131  182 CM   Comment: Performed at [facility], [address] [place], [place] [address]  Resulting Agency  [facility] CLIN LAB [facility] CLIN LAB      Specimen Collected: 06/22/20 10:45 Last Resulted: 06/22/20 12:00     Lab Flowsheet   Order Details   View Encounter   Lab and Collection Details   Routing   Result History     CM=Additional comments    Result Care Coordination  Patient Communication  Add Comments Seen Back to Top        Contains abnormal dataCBC with Differential/Platelet  Status:  Final result Visible to patient:  Yes (seen) Next appt:  06/28/2020 at 07:25 AM in Cardiology (CVD-[facility] Device Remotes) Dx:  Iron deficiency anemia, unspecified i... Order: [id number]  0 Result Notes  Ref Range & Units 5 d ago 6 mo ago  WBC 4.0 - 10.5 K/uL 5.2  4.3  RBC 4.22 - 5.81 MIL/uL 3.96Low  4.44   Hemoglobin 13.0 - 17.0 g/dL 11.4Low  12.4Low   HCT 39 - 52 % 34.3Low  38.8Low   MCV 80.0 - 100.0 fL 86.6  87.4   MCH 26.0 - 34.0 pg 28.8  27.9   MCHC 30.0 - 36.0 g/dL 33.2  32.0   RDW 11.5 - 15.5 % 12.6  12.2   Platelets 150 - 400 K/uL 257  217   nRBC 0.0 - 0.2 % 0.0  0.0   Neutrophils Relative % % 60  56   Neutro Abs 1.7 - 7.7 K/uL 3.1  2.4   Lymphocytes Relative % 26  28   Lymphs Abs 0.7 - 4.0 K/uL 1.4  1.2   Monocytes Relative % 11  12   Monocytes Absolute 0 - 1 K/uL 0.6  0.5   Eosinophils Relative % 2  3   Eosinophils Absolute 0 - 0 K/uL 0.1  0.1   Basophils Relative % 1  1   Basophils Absolute 0 - 0 K/uL 0.0  0.0   Immature Granulocytes % 0  0   Abs Immature Granulocytes 0.00 - 0.07 K/uL 0.01  0.00 CM   Comment: Performed at [facility], [address]  [place], [place] [address]  Resulting Agency  [facility] CLIN LAB [facility] CLIN LAB      Specimen Collected: 06/22/20 10:45 Last Resulted: 06/22/20 11:06

## 2020-06-28 ENCOUNTER — Ambulatory Visit: Payer: Medicare HMO | Attending: Internal Medicine

## 2020-06-28 ENCOUNTER — Ambulatory Visit (INDEPENDENT_AMBULATORY_CARE_PROVIDER_SITE_OTHER): Payer: Medicare HMO

## 2020-06-28 DIAGNOSIS — Z23 Encounter for immunization: Secondary | ICD-10-CM

## 2020-06-28 DIAGNOSIS — I428 Other cardiomyopathies: Secondary | ICD-10-CM | POA: Diagnosis not present

## 2020-06-28 LAB — CUP PACEART REMOTE DEVICE CHECK
Battery Remaining Longevity: 52 mo
Battery Voltage: 2.96 V
Brady Statistic AP VP Percent: 0.01 %
Brady Statistic AP VS Percent: 0 %
Brady Statistic AS VP Percent: 98.73 %
Brady Statistic AS VS Percent: 1.25 %
Brady Statistic RA Percent Paced: 0.01 %
Brady Statistic RV Percent Paced: 89.56 %
Date Time Interrogation Session: 20211005001606
HighPow Impedance: 47 Ohm
Implantable Lead Implant Date: 20181003
Implantable Lead Implant Date: 20181003
Implantable Lead Implant Date: 20181003
Implantable Lead Location: 753858
Implantable Lead Location: 753859
Implantable Lead Location: 753860
Implantable Lead Model: 4398
Implantable Lead Model: 5076
Implantable Pulse Generator Implant Date: 20181003
Lead Channel Impedance Value: 176 Ohm
Lead Channel Impedance Value: 176 Ohm
Lead Channel Impedance Value: 182.4 Ohm
Lead Channel Impedance Value: 209 Ohm
Lead Channel Impedance Value: 218.087
Lead Channel Impedance Value: 285 Ohm
Lead Channel Impedance Value: 304 Ohm
Lead Channel Impedance Value: 304 Ohm
Lead Channel Impedance Value: 399 Ohm
Lead Channel Impedance Value: 418 Ohm
Lead Channel Impedance Value: 418 Ohm
Lead Channel Impedance Value: 456 Ohm
Lead Channel Impedance Value: 551 Ohm
Lead Channel Impedance Value: 646 Ohm
Lead Channel Impedance Value: 665 Ohm
Lead Channel Impedance Value: 665 Ohm
Lead Channel Impedance Value: 760 Ohm
Lead Channel Impedance Value: 760 Ohm
Lead Channel Pacing Threshold Amplitude: 0.625 V
Lead Channel Pacing Threshold Amplitude: 0.75 V
Lead Channel Pacing Threshold Amplitude: 1.25 V
Lead Channel Pacing Threshold Pulse Width: 0.4 ms
Lead Channel Pacing Threshold Pulse Width: 0.4 ms
Lead Channel Pacing Threshold Pulse Width: 0.4 ms
Lead Channel Sensing Intrinsic Amplitude: 1.875 mV
Lead Channel Sensing Intrinsic Amplitude: 1.875 mV
Lead Channel Sensing Intrinsic Amplitude: 9.625 mV
Lead Channel Sensing Intrinsic Amplitude: 9.625 mV
Lead Channel Setting Pacing Amplitude: 1.5 V
Lead Channel Setting Pacing Amplitude: 2 V
Lead Channel Setting Pacing Amplitude: 2.5 V
Lead Channel Setting Pacing Pulse Width: 0.4 ms
Lead Channel Setting Pacing Pulse Width: 0.4 ms
Lead Channel Setting Sensing Sensitivity: 0.3 mV

## 2020-06-28 NOTE — Progress Notes (Signed)
   Covid-19 Vaccination Clinic  Name:  Philip Ponce    MRN: [id number] DOB: 08-06-1971  06/28/2020  Mr. [name] was observed post Covid-19 immunization for 15 minutes without incident. He was provided with Vaccine Information Sheet and instruction to access the V-Safe system.   Mr. [name] was instructed to call 911 with any severe reactions post vaccine: Marland Kitchen Difficulty breathing  . Swelling of face and throat  . A fast heartbeat  . A bad rash all over body  . Dizziness and weakness      Covid-19 Vaccination Clinic  Name:  Philip Ponce    MRN: [id number] DOB: 1971/03/30  06/28/2020  Mr. [name] was observed post Covid-19 immunization for 15 minutes without incident. He was provided with Vaccine Information Sheet and instruction to access the V-Safe system.   Mr. [name] was instructed to call 911 with any severe reactions post vaccine: Marland Kitchen Difficulty breathing  . Swelling of face and throat  . A fast heartbeat  . A bad rash all over body  . Dizziness and weakness

## 2020-07-01 NOTE — Progress Notes (Signed)
Remote ICD transmission.   

## 2020-07-16 ENCOUNTER — Telehealth: Payer: Self-pay | Admitting: Family Medicine

## 2020-07-16 DIAGNOSIS — D509 Iron deficiency anemia, unspecified: Secondary | ICD-10-CM

## 2020-07-16 DIAGNOSIS — E7800 Pure hypercholesterolemia, unspecified: Secondary | ICD-10-CM

## 2020-07-16 DIAGNOSIS — Z125 Encounter for screening for malignant neoplasm of prostate: Secondary | ICD-10-CM

## 2020-07-16 DIAGNOSIS — E119 Type 2 diabetes mellitus without complications: Secondary | ICD-10-CM

## 2020-07-16 DIAGNOSIS — Z1159 Encounter for screening for other viral diseases: Secondary | ICD-10-CM

## 2020-07-16 DIAGNOSIS — Z5320 Procedure and treatment not carried out because of patient's decision for unspecified reasons: Secondary | ICD-10-CM

## 2020-07-16 DIAGNOSIS — Z794 Long term (current) use of insulin: Secondary | ICD-10-CM

## 2020-07-16 NOTE — Telephone Encounter (Signed)
-----   Message from Cloyd Stagers, RT sent at 07/04/2020  2:26 PM EDT ----- Regarding: Lab Orders for Tuesday 10.26.2021 Please place lab orders for Tuesday 10.26.2021, office visit for physical on Tuesday 11.2.2021 Thank you, Dyke Maes RT(R)

## 2020-07-19 ENCOUNTER — Other Ambulatory Visit: Payer: Self-pay

## 2020-07-19 ENCOUNTER — Other Ambulatory Visit (INDEPENDENT_AMBULATORY_CARE_PROVIDER_SITE_OTHER): Payer: Medicare HMO

## 2020-07-19 ENCOUNTER — Ambulatory Visit (INDEPENDENT_AMBULATORY_CARE_PROVIDER_SITE_OTHER): Payer: Medicare HMO

## 2020-07-19 DIAGNOSIS — E7800 Pure hypercholesterolemia, unspecified: Secondary | ICD-10-CM | POA: Diagnosis not present

## 2020-07-19 DIAGNOSIS — Z0000 Encounter for general adult medical examination without abnormal findings: Secondary | ICD-10-CM

## 2020-07-19 DIAGNOSIS — Z794 Long term (current) use of insulin: Secondary | ICD-10-CM

## 2020-07-19 DIAGNOSIS — E119 Type 2 diabetes mellitus without complications: Secondary | ICD-10-CM

## 2020-07-19 DIAGNOSIS — Z125 Encounter for screening for malignant neoplasm of prostate: Secondary | ICD-10-CM

## 2020-07-19 DIAGNOSIS — Z1159 Encounter for screening for other viral diseases: Secondary | ICD-10-CM

## 2020-07-19 LAB — LIPID PANEL
Cholesterol: 114 mg/dL (ref 0–200)
HDL: 60.4 mg/dL (ref >39.00)
LDL Cholesterol: 45 mg/dL (ref 0–99)
NonHDL: 53.33
Total CHOL/HDL Ratio: 2
Triglycerides: 43 mg/dL (ref 0.0–149.0)
VLDL: 8.6 mg/dL (ref 0.0–40.0)

## 2020-07-19 LAB — COMPREHENSIVE METABOLIC PANEL
ALT: 18 U/L (ref 0–53)
AST: 20 U/L (ref 0–37)
Albumin: 3.8 g/dL (ref 3.5–5.2)
Alkaline Phosphatase: 75 U/L (ref 39–117)
BUN: 12 mg/dL (ref 6–23)
CO2: 31 mEq/L (ref 19–32)
Calcium: 8.8 mg/dL (ref 8.4–10.5)
Chloride: 103 mEq/L (ref 96–112)
Creatinine, Ser: 0.94 mg/dL (ref 0.40–1.50)
GFR: 94.09 mL/min (ref >60.00)
Glucose, Bld: 159 mg/dL — ABNORMAL HIGH (ref 70–99)
Potassium: 4.1 mEq/L (ref 3.5–5.1)
Sodium: 139 mEq/L (ref 135–145)
Total Bilirubin: 0.6 mg/dL (ref 0.2–1.2)
Total Protein: 6.9 g/dL (ref 6.0–8.3)

## 2020-07-19 LAB — PSA: PSA: 0.12 ng/mL (ref 0.10–4.00)

## 2020-07-19 LAB — MICROALBUMIN / CREATININE URINE RATIO
Creatinine,U: 199.7 mg/dL
Microalb Creat Ratio: 0.8 mg/g (ref 0.0–30.0)
Microalb, Ur: 1.6 mg/dL (ref 0.0–1.9)

## 2020-07-19 LAB — HEMOGLOBIN A1C: Hgb A1c MFr Bld: 6.4 % (ref 4.6–6.5)

## 2020-07-19 NOTE — Progress Notes (Signed)
Subjective:   Philip Ponce is a 49 y.o. male who presents for Medicare Annual/Subsequent preventive examination.  Review of Systems: N/A      I connected with the patient today by telephone and verified that I am speaking with the correct person using two identifiers. Location patient: home Location nurse: work Persons participating in the telephone visit: patient, nurse.   I discussed the limitations, risks, security and privacy concerns of performing an evaluation and management service by telephone and the availability of in person appointments. I also discussed with the patient that there may be a patient responsible charge related to this service. The patient expressed understanding and verbally consented to this telephonic visit.        Cardiac Risk Factors include: male gender;diabetes mellitus     Objective:    Today's Vitals   There is no height or weight on file to calculate BMI.  Advanced Directives 07/19/2020 12/21/2019 04/21/2019 08/14/2018 06/28/2018 01/31/2018 10/04/2017  Does Patient Have a Medical Advance Directive? Yes Yes Yes No Yes Yes Yes  Type of Paramedic of [place];Living will Living will Living will - Living will Living will [place]  Does patient want to make changes to medical advance directive? - No - Patient declined - No - Patient declined No - Patient declined No - Patient declined No - Patient declined  Copy of [place] in Chart? Yes - validated most recent copy scanned in chart (See row information) - - - - - Yes  Would patient like information on creating a medical advance directive? - No - Patient declined - No - Patient declined - No - Patient declined -    Current Medications (verified) Outpatient Encounter Medications as of 07/19/2020  Medication Sig  . calcium carbonate (OS-CAL) 600 MG TABS tablet Take 1,200 mg by mouth daily with breakfast.   . carvedilol (COREG) 12.5 MG  tablet TAKE 1 TABLET BY MOUTH TWICE DAILY  . digoxin (LANOXIN) 0.125 MG tablet Take 1 tablet (125 mcg total) by mouth daily.  Marland Kitchen glucose blood (ONE TOUCH ULTRA TEST) test strip Check blood sugar once daily and as instructed. Dx E11.9  . ibuprofen (ADVIL,MOTRIN) 600 MG tablet Take 600 mg by mouth every 6 (six) hours as needed. for pain  . Insulin Glargine (BASAGLAR KWIKPEN) 100 UNIT/ML INJECT SUBCUTANEOUSLY 18   UNITS (0.18ML) DAILY  . metFORMIN (GLUCOPHAGE-XR) 500 MG 24 hr tablet Take 1 tablet (500 mg total) by mouth daily with breakfast.  . Multiple Vitamins-Minerals (ADULT GUMMY PO) Take 2 tablets by mouth daily.  . Omega-3 Fatty Acids (FISH OIL PO) Take 800 mg by mouth daily.  Marland Kitchen omeprazole (PRILOSEC) 40 MG capsule TAKE 1 CAPSULE DAILY AS    NEEDED FOR INDIGESTION  . sacubitril-valsartan (ENTRESTO) 97-103 MG Take 1 tablet by mouth 2 (two) times daily.  Marland Kitchen spironolactone (ALDACTONE) 25 MG tablet Take 1 tablet (25 mg total) by mouth daily.   No facility-administered encounter medications on file as of 07/19/2020.    Allergies (verified) Mobic [meloxicam], Tramadol, and Diclofenac   History: Past Medical History:  Diagnosis Date  . Acute blood loss anemia 11/20/2016  . AICD (automatic cardioverter/defibrillator) present    a. 06/2017 s/p MDT DTMA 1QQ [place] MRI Quad CRT-D SureScan (ser # [id number]).  . Chronic combined systolic (congestive) and diastolic (congestive) heart failure ([place])    a. 11/2016 Echo: EF 20-25%, glob HK, antsept, ant, apical HK, Gr1 DD, mild MR, mildly dil  LA, nl RV fxn; b. 05/2017 Echo: Ef 20-25%, Gr2 DD, prominent apical trabeculations; c. 12/2017 Echo: EF 30-35%, Gr1 DD, glob HK.  . Diabetes mellitus ([place])   . Essential hypertension   . Family history of adverse reaction to anesthesia    mom had a hard time waking up  . GERD (gastroesophageal reflux disease)   . NICM (nonischemic cardiomyopathy) ([place])    a. 11/2016 Cath: nl cors, EF 25-30%; b. 11/2016 Echo: EF  20-25%; c. 05/2017 Echo: EF 20-25%; d. 12/2017 Echo: EF 30-35%, Gr1 DD.  Marland Kitchen Slipped intervertebral disc    L4 L5  . STEMI (ST elevation myocardial infarction) ([place])    a. 11/2016 ST elevation -->Nl cors on cath.   Past Surgical History:  Procedure Laterality Date  . BIV ICD INSERTION CRT-D  06/26/2017  . BIV ICD INSERTION CRT-D N/A 06/26/2017   Procedure: BIV ICD INSERTION CRT-D;  Surgeon: [name], MD;  Location: [place] CV LAB;  Service: Cardiovascular;  Laterality: N/A;  . CERVICAL DISC ARTHROPLASTY N/A 07/24/2017   Procedure: CERVICAL ANTERIOR [place] ARTHROPLASTY C5-C7;  Surgeon: [name], MD;  Location: [facility] ORS;  Service: Neurosurgery;  Laterality: N/A;  . COLONOSCOPY WITH PROPOFOL N/A 12/03/2016   Procedure: COLONOSCOPY WITH PROPOFOL;  Surgeon: [name], MD;  Location: [facility] ENDOSCOPY;  Service: Endoscopy;  Laterality: N/A;  . ESOPHAGOGASTRODUODENOSCOPY (EGD) WITH PROPOFOL N/A 12/03/2016   Procedure: ESOPHAGOGASTRODUODENOSCOPY (EGD) WITH PROPOFOL;  Surgeon: [name], MD;  Location: [facility] ENDOSCOPY;  Service: Endoscopy;  Laterality: N/A;  . FINGER SURGERY Right 2012   index finger  . GASTRIC BYPASS    . GIVENS CAPSULE STUDY N/A 01/07/2017   Procedure: GIVENS CAPSULE STUDY;  Surgeon: [name], MD;  Location: [facility] ENDOSCOPY;  Service: Endoscopy;  Laterality: N/A;  . LEFT HEART CATH AND CORONARY ANGIOGRAPHY N/A 12/04/2016   Procedure: Left Heart Cath and Coronary Angiography;  Surgeon: [name], MD;  Location: [place] CV LAB;  Service: Cardiovascular;  Laterality: N/A;  . SHOULDER ARTHROSCOPY WITH OPEN ROTATOR CUFF REPAIR Left 10/04/2017   Procedure: SHOULDER ARTHROSCOPY WITH ROTATOR CUFF REPAIR, SUBACROMIAL DECOMPRESSION,OPEN BICEP TENODESIS, EXTENSIVE DEBRIDEMENT;  Surgeon: [name], MD;  Location: [facility] ORS;  Service: Orthopedics;  Laterality: Left;   Family History  Problem Relation Age of Onset  . Diabetes Mother   . Hypertension Mother   .  Cancer Mother        breast  . Diabetes Father   . Hypertension Father   . Cancer Father        colon  . Cancer Maternal Grandfather        prostate  . Stroke Maternal Grandfather        CVA   Social History   Socioeconomic History  . Marital status: Married    Spouse name: Not on file  . Number of children: Not on file  . Years of education: Not on file  . Highest education level: Not on file  Occupational History  . Occupation: Teacher, early years/pre, Brewing technologist: [employer]  . Occupation: disability  Tobacco Use  . Smoking status: Never Smoker  . Smokeless tobacco: Never Used  Vaping Use  . Vaping Use: Never used  Substance and Sexual Activity  . Alcohol use: Not Currently  . Drug use: No  . Sexual activity: Yes  Other Topics Concern  . Not on file  Social History Narrative   Regular exercise-yes, walking one mile per day   Diet: fast  food, diet soda, unsweeted tea   Social Determinants of Health   Financial Resource Strain: Low Risk   . Difficulty of Paying Living Expenses: Not hard at all  Food Insecurity: No Food Insecurity  . Worried About Charity fundraiser in the Last Year: Never true  . Ran Out of Food in the Last Year: Never true  Transportation Needs: No Transportation Needs  . Lack of Transportation (Medical): No  . Lack of Transportation (Non-Medical): No  Physical Activity: Sufficiently Active  . Days of Exercise per Week: 7 days  . Minutes of Exercise per Session: 60 min  Stress: No Stress Concern Present  . Feeling of Stress : Not at all  Social Connections:   . Frequency of Communication with Friends and Family: Not on file  . Frequency of Social Gatherings with Friends and Family: Not on file  . Attends Religious Services: Not on file  . Active Member of Clubs or Organizations: Not on file  . Attends Archivist Meetings: Not on file  . Marital Status: Not on file    Tobacco Counseling Counseling given: Not  Answered   Clinical Intake:  Pre-visit preparation completed: Yes  Pain : No/denies pain     Nutritional Risks: None Diabetes: Yes CBG done?: No Did pt. bring in CBG monitor from home?: No  How often do you need to have someone help you when you read instructions, pamphlets, or other written materials from your doctor or pharmacy?: 1 - Never What is the last grade level you completed in school?: 1 year of college  Diabetic: Yes Nutrition Risk Assessment:  Has the patient had any N/V/D within the last 2 months?  No  Does the patient have any non-healing wounds?  No  Has the patient had any unintentional weight loss or weight gain?  No   Diabetes:  Is the patient diabetic?  Yes  If diabetic, was a CBG obtained today?  No  Did the patient bring in their glucometer from home?  No How often do you monitor your CBG's? When needed .   Financial Strains and Diabetes Management:  Are you having any financial strains with the device, your supplies or your medication? No.  Does the patient want to be seen by Chronic Care Management for management of their diabetes?  No Would the patient like to be referred to a Nutritionist or for Diabetic Management?  No  Diabetic Exams:  Diabetic Eye Exam: Overdue for diabetic eye exam. Pt has been advised about the importance in completing this exam. Patient advised to call and schedule an eye exam. Patient states his appointment is scheduled for 10/2020. Diabetic Foot Exam: Completed 12/28/2019   Interpreter Needed?: No  Information entered by :: CJohnson, LPN   Activities of Daily Living In your present state of health, do you have any difficulty performing the following activities: 07/19/2020  Hearing? N  Vision? N  Difficulty concentrating or making decisions? N  Walking or climbing stairs? N  Dressing or bathing? N  Doing errands, shopping? N  Preparing Food and eating ? N  Using the Toilet? N  In the past six months, have you  accidently leaked urine? N  Do you have problems with loss of bowel control? N  Managing your Medications? N  Managing your Finances? N  Housekeeping or managing your Housekeeping? N  Some recent data might be hidden    Patient Care Team: [name], MD as PCP - General End, Harrell Gave,  MD as PCP - Cardiology (Cardiology) Alisa Graff, FNP as Nurse Practitioner (Family Medicine)  Indicate any recent Medical Services you may have received from other than [facility] providers in the past year (date may be approximate).     Assessment:   This is a routine wellness examination for [place].  Hearing/Vision screen  Hearing Screening   125Hz  250Hz  500Hz  1000Hz  2000Hz  3000Hz  4000Hz  6000Hz  8000Hz   Right ear:           Left ear:           Vision Screening Comments: Patient gets annual eye exams  Dietary issues and exercise activities discussed: Current Exercise Habits: Home exercise routine, Type of exercise: walking, Time (Minutes): 60, Frequency (Times/Week): 7, Weekly Exercise (Minutes/Week): 420, Intensity: Moderate, Exercise limited by: None identified  Goals    . Patient Stated     07/19/2020, I will continue to walk everyday for 1 mile.      Depression Screen PHQ 2/9 Scores 07/19/2020 02/18/2018 11/19/2017 09/10/2017 08/05/2017 05/06/2017 03/22/2017  PHQ - 2 Score 0 0 0 0 0 0 0  PHQ- 9 Score 0 - - - - - -    Fall Risk Fall Risk  07/19/2020 08/19/2018 02/18/2018 11/19/2017 09/10/2017  Falls in the past year? 0 0 No No No  Number falls in past yr: 0 0 - - -  Injury with Fall? 0 0 - - -  Risk for fall due to : Medication side effect - - - -  Follow up Falls evaluation completed;Falls prevention discussed - - - -    Any stairs in or around the home? Yes  If so, are there any without handrails? No  Home free of loose throw rugs in walkways, pet beds, electrical cords, etc? Yes  Adequate lighting in your home to reduce risk of falls? Yes   ASSISTIVE DEVICES UTILIZED TO  PREVENT FALLS:  Life alert? No  Use of a cane, walker or w/c? No  Grab bars in the bathroom? No  Shower chair or bench in shower? No  Elevated toilet seat or a handicapped toilet? No   TIMED UP AND GO:  Was the test performed? N/A, telephonic visit.   Cognitive Function: MMSE - Mini Mental State Exam 07/19/2020  Orientation to time 5  Orientation to Place 5  Registration 3  Attention/ Calculation 0  Attention/Calculation-comments can't spell well  Recall 2  Language- repeat 1       Mini Cog  Mini-Cog screen was completed. Maximum score is 22. A value of 0 denotes this part of the MMSE was not completed or the patient failed this part of the Mini-Cog screening.  Immunizations Immunization History  Administered Date(s) Administered  . Influenza, Seasonal, Injecte, Preservative Fre 10/07/2012, 09/28/2013, 10/19/2016  . Influenza,inj,Quad PF,6+ Mos 07/19/2015, 06/27/2017, 07/15/2018, 07/23/2019  . Influenza-Unspecified 07/25/2016  . PFIZER SARS-COV-2 Vaccination 12/17/2019, 01/11/2020, 06/28/2020  . Pneumococcal Polysaccharide-23 10/07/2012, 12/24/2017  . Tdap 02/27/2011    TDAP status: Up to date Flu Vaccine status: due, will get at upcoming physical Pneumococcal vaccine status: Up to date Covid-19 vaccine status: Completed vaccines  Qualifies for Shingles Vaccine? Yes   Zostavax completed No   Shingrix Completed?: No.    Education has been provided regarding the importance of this vaccine. Patient has been advised to call insurance company to determine out of pocket expense if they have not yet received this vaccine. Advised may also receive vaccine at local pharmacy or Health Dept. Verbalized acceptance and understanding.  Screening Tests Health Maintenance  Topic Date Due  . Hepatitis C Screening  Never done  . OPHTHALMOLOGY EXAM  03/06/2019  . INFLUENZA VACCINE  04/24/2020  . HIV Screening  01/20/2021 (Originally 05/19/1986)  . FOOT EXAM  12/27/2020  .  HEMOGLOBIN A1C  01/17/2021  . TETANUS/TDAP  02/26/2021  . URINE MICROALBUMIN  07/19/2021  . COLONOSCOPY  12/03/2021  . PNEUMOCOCCAL POLYSACCHARIDE VACCINE AGE 32-64 HIGH RISK  Completed  . COVID-19 Vaccine  Completed    Health Maintenance  Health Maintenance Due  Topic Date Due  . Hepatitis C Screening  Never done  . OPHTHALMOLOGY EXAM  03/06/2019  . INFLUENZA VACCINE  04/24/2020    Colorectal cancer screening: Completed 12/03/2016. Repeat every 5 years  Lung Cancer Screening: (Low Dose CT Chest recommended if Age 65-80 years, 30 pack-year currently smoking OR have quit w/in 15years.) does not qualify.    Additional Screening:  Hepatitis C Screening: does qualify; Completed due  Vision Screening: Recommended annual ophthalmology exams for early detection of glaucoma and other disorders of the eye. Is the patient up to date with their annual eye exam?  Yes  Who is the provider or what is the name of the office in which the patient attends annual eye exams? [facility] If pt is not established with a provider, would they like to be referred to a provider to establish care? No .   Dental Screening: Recommended annual dental exams for proper oral hygiene  Community Resource Referral / Chronic Care Management: CRR required this visit?  No   CCM required this visit?  No      Plan:     I have personally reviewed and noted the following in the patient's chart:   . Medical and social history . Use of alcohol, tobacco or illicit drugs  . Current medications and supplements . Functional ability and status . Nutritional status . Physical activity . Advanced directives . List of other physicians . Hospitalizations, surgeries, and ER visits in previous 12 months . Vitals . Screenings to include cognitive, depression, and falls . Referrals and appointments  In addition, I have reviewed and discussed with patient certain preventive protocols, quality metrics, and best  practice recommendations. A written personalized care plan for preventive services as well as general preventive health recommendations were provided to patient.   Due to this being a telephonic visit, the after visit summary with patients personalized plan was offered to patient via office or my-chart. Patient preferred to pick up at office at next visit or via mychart.   Andrez Grime, LPN   [id number]

## 2020-07-19 NOTE — Progress Notes (Signed)
No critical labs need to be addressed urgently. We will discuss labs in detail at upcoming office visit.   

## 2020-07-19 NOTE — Progress Notes (Signed)
PCP notes:  Health Maintenance: Flu- due Eye exam- scheduled 10/2019 per patient   Abnormal Screenings: MMSE score 16   Patient concerns: none   Nurse concerns: none   Next PCP appt.: 07/26/2020 @ 11 am

## 2020-07-19 NOTE — Patient Instructions (Signed)
[name] , Thank you for taking time to come for your Medicare Wellness Visit. I appreciate your ongoing commitment to your health goals. Please review the following plan we discussed and let me know if I can assist you in the future.   Screening recommendations/referrals: Colonoscopy: Up to date, completed 12/03/2016, due 11/2021 Recommended yearly ophthalmology/optometry visit for glaucoma screening and checkup Recommended yearly dental visit for hygiene and checkup  Vaccinations: Influenza vaccine: due, will get at upcoming physical Pneumococcal vaccine: Up to date, completed 12/24/2017, due at age 69 Tdap vaccine: Up to date, completed 02/27/2011, due 02/2021 Shingles vaccine: due, check with your insurance regarding coverage    Covid-19: Completed series  Advanced directives: copy in chart  Conditions/risks identified: Diabetes  Next appointment: Follow up in one year for your annual wellness visit.   Preventive Care 38-18 years old, Male Preventive care refers to lifestyle choices and visits with your health care provider that can promote health and wellness. What does preventive care include?  A yearly physical exam. This is also called an annual well check.  Dental exams once or twice a year.  Routine eye exams. Ask your health care provider how often you should have your eyes checked.  Personal lifestyle choices, including:  Daily care of your teeth and gums.  Regular physical activity.  Eating a healthy diet.  Avoiding tobacco and drug use.  Limiting alcohol use.  Practicing safe sex.  Taking low doses of aspirin every day.  Taking vitamin and mineral supplements as recommended by your health care provider. What happens during an annual well check? The services and screenings done by your health care provider during your annual well check will depend on your age, overall health, lifestyle risk factors, and family history of disease. Counseling  Your health  care provider may ask you questions about your:  Alcohol use.  Tobacco use.  Drug use.  Emotional well-being.  Home and relationship well-being.  Sexual activity.  Eating habits.  History of falls.  Memory and ability to understand (cognition).  Work and work Statistician. Screening  You may have the following tests or measurements:  Height, weight, and BMI.  Blood pressure.  Lipid and cholesterol levels. These may be checked every 5 years, or more frequently if you are over 40 years old.  Skin check.  Lung cancer screening. You may have this screening every year starting at age 64 if you have a 30-pack-year history of smoking and currently smoke or have quit within the past 15 years.  Fecal occult blood test (FOBT) of the stool. You may have this test every year starting at age 63.  Flexible sigmoidoscopy or colonoscopy. You may have a sigmoidoscopy every 5 years or a colonoscopy every 10 years starting at age 63.  Prostate cancer screening. Recommendations will vary depending on your family history and other risks.  Hepatitis C blood test.  Hepatitis B blood test.  Sexually transmitted disease (STD) testing.  Diabetes screening. This is done by checking your blood sugar (glucose) after you have not eaten for a while (fasting). You may have this done every 1-3 years.  Abdominal aortic aneurysm (AAA) screening. You may need this if you are a current or former smoker.  Osteoporosis. You may be screened starting at age 55 if you are at high risk. Talk with your health care provider about your test results, treatment options, and if necessary, the need for more tests. Vaccines  Your health care provider may recommend certain vaccines,  such as:  Influenza vaccine. This is recommended every year.  Tetanus, diphtheria, and acellular pertussis (Tdap, Td) vaccine. You may need a Td booster every 10 years.  Zoster vaccine. You may need this after age  36.  Pneumococcal 13-valent conjugate (PCV13) vaccine. One dose is recommended after age 64.  Pneumococcal polysaccharide (PPSV23) vaccine. One dose is recommended after age 89. Talk to your health care provider about which screenings and vaccines you need and how often you need them. This information is not intended to replace advice given to you by your health care provider. Make sure you discuss any questions you have with your health care provider. Document Released: 10/07/2015 Document Revised: 05/30/2016 Document Reviewed: 07/12/2015 Elsevier Interactive Patient Education  2017 [place] Prevention in the Home Falls can cause injuries. They can happen to people of all ages. There are many things you can do to make your home safe and to help prevent falls. What can I do on the outside of my home?  Regularly fix the edges of walkways and driveways and fix any cracks.  Remove anything that might make you trip as you walk through a door, such as a raised step or threshold.  Trim any bushes or trees on the path to your home.  Use bright outdoor lighting.  Clear any walking paths of anything that might make someone trip, such as rocks or tools.  Regularly check to see if handrails are loose or broken. Make sure that both sides of any steps have handrails.  Any raised decks and porches should have guardrails on the edges.  Have any leaves, snow, or ice cleared regularly.  Use sand or salt on walking paths during winter.  Clean up any spills in your garage right away. This includes oil or grease spills. What can I do in the bathroom?  Use night lights.  Install grab bars by the toilet and in the tub and shower. Do not use towel bars as grab bars.  Use non-skid mats or decals in the tub or shower.  If you need to sit down in the shower, use a plastic, non-slip stool.  Keep the floor dry. Clean up any water that spills on the floor as soon as it happens.  Remove  soap buildup in the tub or shower regularly.  Attach bath mats securely with double-sided non-slip rug tape.  Do not have throw rugs and other things on the floor that can make you trip. What can I do in the bedroom?  Use night lights.  Make sure that you have a light by your bed that is easy to reach.  Do not use any sheets or blankets that are too big for your bed. They should not hang down onto the floor.  Have a firm chair that has side arms. You can use this for support while you get dressed.  Do not have throw rugs and other things on the floor that can make you trip. What can I do in the kitchen?  Clean up any spills right away.  Avoid walking on wet floors.  Keep items that you use a lot in easy-to-reach places.  If you need to reach something above you, use a strong step stool that has a grab bar.  Keep electrical cords out of the way.  Do not use floor polish or wax that makes floors slippery. If you must use wax, use non-skid floor wax.  Do not have throw rugs and other things on  the floor that can make you trip. What can I do with my stairs?  Do not leave any items on the stairs.  Make sure that there are handrails on both sides of the stairs and use them. Fix handrails that are broken or loose. Make sure that handrails are as long as the stairways.  Check any carpeting to make sure that it is firmly attached to the stairs. Fix any carpet that is loose or worn.  Avoid having throw rugs at the top or bottom of the stairs. If you do have throw rugs, attach them to the floor with carpet tape.  Make sure that you have a light switch at the top of the stairs and the bottom of the stairs. If you do not have them, ask someone to add them for you. What else can I do to help prevent falls?  Wear shoes that:  Do not have high heels.  Have rubber bottoms.  Are comfortable and fit you well.  Are closed at the toe. Do not wear sandals.  If you use a  stepladder:  Make sure that it is fully opened. Do not climb a closed stepladder.  Make sure that both sides of the stepladder are locked into place.  Ask someone to hold it for you, if possible.  Clearly mark and make sure that you can see:  Any grab bars or handrails.  First and last steps.  Where the edge of each step is.  Use tools that help you move around (mobility aids) if they are needed. These include:  Canes.  Walkers.  Scooters.  Crutches.  Turn on the lights when you go into a dark area. Replace any light bulbs as soon as they burn out.  Set up your furniture so you have a clear path. Avoid moving your furniture around.  If any of your floors are uneven, fix them.  If there are any pets around you, be aware of where they are.  Review your medicines with your doctor. Some medicines can make you feel dizzy. This can increase your chance of falling. Ask your doctor what other things that you can do to help prevent falls. This information is not intended to replace advice given to you by your health care provider. Make sure you discuss any questions you have with your health care provider. Document Released: 07/07/2009 Document Revised: 02/16/2016 Document Reviewed: 10/15/2014 Elsevier Interactive Patient Education  2017 [employer].

## 2020-07-20 LAB — HEPATITIS C ANTIBODY
Hepatitis C Ab: NONREACTIVE
SIGNAL TO CUT-OFF: 0.06 (ref <1.00)

## 2020-07-21 NOTE — Progress Notes (Signed)
No critical labs need to be addressed urgently. We will discuss labs in detail at upcoming office visit.   

## 2020-07-25 DIAGNOSIS — I10 Essential (primary) hypertension: Secondary | ICD-10-CM | POA: Diagnosis not present

## 2020-07-25 DIAGNOSIS — R0600 Dyspnea, unspecified: Secondary | ICD-10-CM | POA: Diagnosis not present

## 2020-07-25 DIAGNOSIS — Z794 Long term (current) use of insulin: Secondary | ICD-10-CM | POA: Diagnosis not present

## 2020-07-25 DIAGNOSIS — E7800 Pure hypercholesterolemia, unspecified: Secondary | ICD-10-CM | POA: Diagnosis not present

## 2020-07-25 DIAGNOSIS — E119 Type 2 diabetes mellitus without complications: Secondary | ICD-10-CM | POA: Diagnosis not present

## 2020-07-25 DIAGNOSIS — G4733 Obstructive sleep apnea (adult) (pediatric): Secondary | ICD-10-CM | POA: Diagnosis not present

## 2020-07-25 DIAGNOSIS — I428 Other cardiomyopathies: Secondary | ICD-10-CM | POA: Diagnosis not present

## 2020-07-25 DIAGNOSIS — I5042 Chronic combined systolic (congestive) and diastolic (congestive) heart failure: Secondary | ICD-10-CM | POA: Diagnosis not present

## 2020-07-25 DIAGNOSIS — Z9884 Bariatric surgery status: Secondary | ICD-10-CM | POA: Diagnosis not present

## 2020-07-25 DIAGNOSIS — Z95810 Presence of automatic (implantable) cardiac defibrillator: Secondary | ICD-10-CM | POA: Diagnosis not present

## 2020-07-26 ENCOUNTER — Encounter: Payer: Self-pay | Admitting: Family Medicine

## 2020-07-26 ENCOUNTER — Ambulatory Visit (INDEPENDENT_AMBULATORY_CARE_PROVIDER_SITE_OTHER): Payer: Medicare HMO | Admitting: Family Medicine

## 2020-07-26 ENCOUNTER — Other Ambulatory Visit: Payer: Self-pay

## 2020-07-26 VITALS — BP 130/76 | HR 68 | Temp 98.5°F | Ht 71.5 in | Wt 201.8 lb

## 2020-07-26 DIAGNOSIS — E1159 Type 2 diabetes mellitus with other circulatory complications: Secondary | ICD-10-CM | POA: Diagnosis not present

## 2020-07-26 DIAGNOSIS — Z23 Encounter for immunization: Secondary | ICD-10-CM | POA: Diagnosis not present

## 2020-07-26 DIAGNOSIS — Z794 Long term (current) use of insulin: Secondary | ICD-10-CM

## 2020-07-26 DIAGNOSIS — I428 Other cardiomyopathies: Secondary | ICD-10-CM | POA: Diagnosis not present

## 2020-07-26 DIAGNOSIS — E1169 Type 2 diabetes mellitus with other specified complication: Secondary | ICD-10-CM

## 2020-07-26 DIAGNOSIS — I152 Hypertension secondary to endocrine disorders: Secondary | ICD-10-CM

## 2020-07-26 DIAGNOSIS — E119 Type 2 diabetes mellitus without complications: Secondary | ICD-10-CM | POA: Diagnosis not present

## 2020-07-26 DIAGNOSIS — Z0000 Encounter for general adult medical examination without abnormal findings: Secondary | ICD-10-CM | POA: Diagnosis not present

## 2020-07-26 DIAGNOSIS — E785 Hyperlipidemia, unspecified: Secondary | ICD-10-CM

## 2020-07-26 DIAGNOSIS — I5042 Chronic combined systolic (congestive) and diastolic (congestive) heart failure: Secondary | ICD-10-CM | POA: Diagnosis not present

## 2020-07-26 DIAGNOSIS — D509 Iron deficiency anemia, unspecified: Secondary | ICD-10-CM | POA: Diagnosis not present

## 2020-07-26 MED ORDER — FREESTYLE LIBRE 14 DAY READER DEVI: 11 refills | Status: DC

## 2020-07-26 MED ORDER — BD PEN NEEDLE SHORT U/F 31G X 8 MM MISC: 3 refills | Status: DC

## 2020-07-26 MED ORDER — FREESTYLE LIBRE 14 DAY SENSOR MISC: 0 refills | Status: DC

## 2020-07-26 MED ORDER — BASAGLAR KWIKPEN 100 UNIT/ML ~~LOC~~ SOPN: PEN_INJECTOR | SUBCUTANEOUS | 1 refills | Status: DC

## 2020-07-26 NOTE — Progress Notes (Signed)
Chief Complaint  Patient presents with  . Annual Exam    Part 2    History of Present Illness: HPI  The patient presents for complete physical and review of chronic health problems. He/She also has the following acute concerns today: none  The patient saw a LPN or RN for medicare wellness visit.  Prevention and wellness was reviewed in detail. Note reviewed and important notes copied below. Health Maintenance: Flu- due Eye exam- scheduled 10/2019 per patient Abnormal Screenings: MMSE score 16  07/26/20 Diabetes:  Stable control of A1C on Balsalgar 18 Units and metformin. Lab Results  Component Value Date   HGBA1C 6.4 07/19/2020  Using medications without difficulties: Hypoglycemic episodes: occ if gets off routine Hyperglycemic episodes: none Feet problems: no ulcers Blood Sugars averaging: eye exam within last year: due  Elevated Cholesterol:LDL at goal.. not on statin Lab Results  Component Value Date   CHOL 114 07/19/2020   HDL 60.40 07/19/2020   LDLCALC 45 07/19/2020   TRIG 43.0 07/19/2020   CHOLHDL 2 07/19/2020  Using medications without problems: Muscle aches:  Diet compliance: moderate Exercise: walking and lifting weights  several times a week. Other complaints: Wt Readings from Last 3 Encounters:  07/26/20 201 lb 12 oz (91.5 kg)  01/21/20 197 lb 4 oz (89.5 kg)  07/23/19 192 lb 4 oz (87.2 kg)   Body mass index is 27.75 kg/m.   Hypertension:    Stable control. BP Readings from Last 3 Encounters:  07/26/20 130/76  01/21/20 138/84  07/23/19 120/90  Using medication without problems or lightheadedness:  none Chest pain with exertion:none Edema:none Short of breath:none Average home BPs: Other issues: Nonischemic cardiomyopathy and diastolic and systolic heart failure follow by cardiology.  Dr. Clayborn Bigness   Walking is helping with knee pain.  Iron def anemia: Followed by Dr. Janese Banks.  Patient Care Team: [name], MD as PCP - General End,  Harrell Gave, MD as PCP - Cardiology (Cardiology) Alisa Graff, FNP as Nurse Practitioner (Family Medicine)    This visit occurred during the SARS-CoV-2 public health emergency.  Safety protocols were in place, including screening questions prior to the visit, additional usage of staff PPE, and extensive cleaning of exam room while observing appropriate contact time as indicated for disinfecting solutions.   COVID 19 screen:  No recent travel or known exposure to COVID19 The patient denies respiratory symptoms of COVID 19 at this time. The importance of social distancing was discussed today.     Review of Systems  Constitutional: Negative for chills and fever.  HENT: Negative for congestion and ear pain.   Eyes: Negative for pain and redness.  Respiratory: Negative for cough and shortness of breath.   Cardiovascular: Negative for chest pain, palpitations and leg swelling.  Gastrointestinal: Negative for abdominal pain, blood in stool, constipation, diarrhea, nausea and vomiting.  Genitourinary: Negative for dysuria.  Musculoskeletal: Negative for falls and myalgias.  Skin: Negative for rash.  Neurological: Negative for dizziness.  Psychiatric/Behavioral: Negative for depression. The patient is not nervous/anxious.       Past Medical History:  Diagnosis Date  . Acute blood loss anemia 11/20/2016  . AICD (automatic cardioverter/defibrillator) present    a. 06/2017 s/p MDT DTMA 1QQ [place] MRI Quad CRT-D SureScan (ser # [id number]).  . Chronic combined systolic (congestive) and diastolic (congestive) heart failure ([place])    a. 11/2016 Echo: EF 20-25%, glob HK, antsept, ant, apical HK, Gr1 DD, mild MR, mildly dil LA, nl RV fxn; b. 05/2017  Echo: Ef 20-25%, Gr2 DD, prominent apical trabeculations; c. 12/2017 Echo: EF 30-35%, Gr1 DD, glob HK.  . Diabetes mellitus ([place])   . Essential hypertension   . Family history of adverse reaction to anesthesia    mom had a hard time waking up  . GERD  (gastroesophageal reflux disease)   . NICM (nonischemic cardiomyopathy) ([place])    a. 11/2016 Cath: nl cors, EF 25-30%; b. 11/2016 Echo: EF 20-25%; c. 05/2017 Echo: EF 20-25%; d. 12/2017 Echo: EF 30-35%, Gr1 DD.  Marland Kitchen Slipped intervertebral disc    L4 L5  . STEMI (ST elevation myocardial infarction) ([place])    a. 11/2016 ST elevation -->Nl cors on cath.    reports that he has never smoked. He has never used smokeless tobacco. He reports previous alcohol use. He reports that he does not use drugs.   Current Outpatient Medications:  .  calcium carbonate (OS-CAL) 600 MG TABS tablet, Take 1,200 mg by mouth daily with breakfast. , Disp: , Rfl:  .  carvedilol (COREG) 12.5 MG tablet, TAKE 1 TABLET BY MOUTH TWICE DAILY, Disp: 180 tablet, Rfl: 3 .  digoxin (LANOXIN) 0.125 MG tablet, Take 1 tablet (125 mcg total) by mouth daily., Disp: 30 tablet, Rfl: 3 .  glucose blood (ONE TOUCH ULTRA TEST) test strip, Check blood sugar once daily and as instructed. Dx E11.9, Disp: 100 each, Rfl: 1 .  Insulin Glargine (BASAGLAR KWIKPEN) 100 UNIT/ML, INJECT SUBCUTANEOUSLY 18   UNITS (0.18ML) DAILY, Disp: 15 mL, Rfl: 1 .  metFORMIN (GLUCOPHAGE-XR) 500 MG 24 hr tablet, Take 1 tablet (500 mg total) by mouth daily with breakfast., Disp: 7 tablet, Rfl: 0 .  Multiple Vitamins-Minerals (ADULT GUMMY PO), Take 2 tablets by mouth daily., Disp: , Rfl:  .  Omega-3 Fatty Acids (FISH OIL PO), Take 800 mg by mouth daily., Disp: , Rfl:  .  omeprazole (PRILOSEC) 40 MG capsule, TAKE 1 CAPSULE DAILY AS    NEEDED FOR INDIGESTION, Disp: 90 capsule, Rfl: 1 .  sacubitril-valsartan (ENTRESTO) 97-103 MG, Take 1 tablet by mouth 2 (two) times daily., Disp: 180 tablet, Rfl: 3 .  spironolactone (ALDACTONE) 25 MG tablet, Take 1 tablet (25 mg total) by mouth daily., Disp: 90 tablet, Rfl: 3   Observations/Objective: Blood pressure 130/76, pulse 68, temperature 98.5 F (36.9 C), temperature source Temporal, height 5' 11.5" (1.816 m), weight 201 lb 12 oz (91.5  kg), SpO2 97 %.  Physical Exam Constitutional:      General: He is not in acute distress.    Appearance: Normal appearance. He is well-developed and well-nourished. He is not ill-appearing or toxic-appearing.  HENT:     Head: Normocephalic and atraumatic.     Right Ear: Hearing, tympanic membrane, ear canal and external ear normal.     Left Ear: Hearing, tympanic membrane, ear canal and external ear normal.     Nose: Nose normal.     Mouth/Throat:     Mouth: Oropharynx is clear and moist and mucous membranes are normal.     Pharynx: Uvula midline.  Eyes:     General: Lids are normal. Lids are everted, no foreign bodies appreciated.     Extraocular Movements: EOM normal.     Conjunctiva/sclera: Conjunctivae normal.     Pupils: Pupils are equal, round, and reactive to light.  Neck:     Thyroid: No thyroid mass or thyromegaly.     Vascular: No carotid bruit.     Trachea: Trachea and phonation normal.  Cardiovascular:  Rate and Rhythm: Normal rate and regular rhythm.     Pulses: Normal pulses and intact distal pulses.     Heart sounds: S1 normal and S2 normal. No murmur heard. No gallop.   Pulmonary:     Breath sounds: Normal breath sounds. No wheezing, rhonchi or rales.  Abdominal:     General: Bowel sounds are normal.     Palpations: Abdomen is soft. There is no hepatosplenomegaly.     Tenderness: There is no abdominal tenderness. There is no CVA tenderness, guarding or rebound.     Hernia: No hernia is present.  Musculoskeletal:     Cervical back: Normal range of motion and neck supple.  Lymphadenopathy:     Cervical: No cervical adenopathy.  Skin:    General: Skin is warm, dry and intact.     Findings: No rash.  Neurological:     Mental Status: He is alert.     Cranial Nerves: No cranial nerve deficit.     Sensory: No sensory deficit.     Gait: Gait normal.     Deep Tendon Reflexes: Strength normal and reflexes are normal and symmetric.  Psychiatric:        Mood  and Affect: Mood and affect normal.        Speech: Speech normal.        Behavior: Behavior normal.        Judgment: Judgment normal.      Assessment and Plan The patient's preventative maintenance and recommended screening tests for an annual wellness exam were reviewed in full today. Brought up to date unless services declined.  Counselled on the importance of diet, exercise, and its role in overall health and mortality. The patient's FH and SH was reviewed, including their home life, tobacco status, and drug and alcohol status.    Vaccines: 3 COVID vaccine, given flu,  uptodate with tdap Prostate Cancer Screen:  Lab Results  Component Value Date   PSA 0.12 07/19/2020   PSA 0.17 06/18/2018   PSA 0.15 10/07/2015  Colon Cancer Screen:  2018 due for repeat in 5 years.      Smoking Status:nonsmoker ETOH/ drug use: none/none  Hep C:  done  HIV screen:      Problem List Items Addressed This Visit    Chronic combined systolic and diastolic heart failure ([place]) (Chronic)    Followed closely by cardiology. On digoxin.  Euvolemic today.      Hyperlipidemia associated with type 2 diabetes mellitus (HCC) (Chronic)    LDL at goal.. not on statin despite indication given statin intolerance.      Relevant Medications   Insulin Glargine (BASAGLAR KWIKPEN) 100 UNIT/ML   Hypertension associated with diabetes (HCC) (Chronic)    Stable, chronic.  Continue current medication.   Coreg 12.5 mg twice daily        Relevant Medications   Insulin Glargine (BASAGLAR KWIKPEN) 100 UNIT/ML   Iron deficiency anemia    Followed by Dr. Janese Banks.      Non-ischemic cardiomyopathy (HCC) (Chronic)   Type 2 diabetes mellitus without complication, with long-term current use of insulin (HCC) (Chronic)    Stable, chronic.  Continue current medication.   Stable control of A1C on Balsalgar 18 Units and metformin 500 mg daily.      Relevant Medications   Insulin Glargine (BASAGLAR KWIKPEN) 100  UNIT/ML   Insulin Pen Needle (B-D ULTRAFINE III SHORT PEN) 31G X 8 MM MISC    Other Visit Diagnoses  Routine general medical examination at a health care facility    -  Primary   Need for influenza vaccination       Relevant Orders   Flu Vaccine QUAD 6+ mos PF IM (Fluarix Quad PF) (Completed)        Eliezer Lofts, MD

## 2020-07-26 NOTE — Patient Instructions (Addendum)
Keep yearly eye exam for diabetes and have the opthalmologist send [place] a copy of the evaluation for the chart. Consider getting Shingrix at pharmacy

## 2020-07-28 DIAGNOSIS — R69 Illness, unspecified: Secondary | ICD-10-CM | POA: Diagnosis not present

## 2020-08-01 ENCOUNTER — Other Ambulatory Visit: Payer: Self-pay | Admitting: Family Medicine

## 2020-08-01 NOTE — Telephone Encounter (Signed)
[facility] Night - Client Nonclinical Telephone Record [facility]urse Client [facility] Night - Client Client Site [place] - Night Physician Eliezer Lofts - MD Contact Type Call Who Is Calling Patient / Member / Family / Caregiver Caller Name Oakes Mccready Caller Phone Number [phone] Patient Name Philip Ponce Patient DOB Oct 30, 1970 Call Type Message Only Information Provided Reason for Call Medication Question / Request Initial Comment Caller stated they needed a medication refill. Additional Comment medication needed is metformin hydrochloride extended release 500mg  and omeprazole 40mg . [facility] for [facility]. Disp. Time Disposition Final User 08/01/2020 8:04:53 AM General Information Provided Yes Verdis Prime Call Closed By: Verdis Prime Transaction Date/Time: 08/01/2020 8:00:00 AM (ET)

## 2020-09-15 DIAGNOSIS — R0781 Pleurodynia: Secondary | ICD-10-CM | POA: Diagnosis not present

## 2020-09-15 DIAGNOSIS — S20212A Contusion of left front wall of thorax, initial encounter: Secondary | ICD-10-CM | POA: Diagnosis not present

## 2020-09-26 NOTE — Assessment & Plan Note (Signed)
Followed closely by cardiology. On digoxin.  Euvolemic today.

## 2020-09-26 NOTE — Assessment & Plan Note (Signed)
Stable, chronic.  Continue current medication.   Stable control of A1C on Balsalgar 18 Units and metformin 500 mg daily.

## 2020-09-26 NOTE — Assessment & Plan Note (Signed)
Stable, chronic.  Continue current medication.   Coreg 12.5 mg twice daily

## 2020-09-26 NOTE — Assessment & Plan Note (Signed)
LDL at goal.. not on statin despite indication given statin intolerance.

## 2020-09-26 NOTE — Assessment & Plan Note (Signed)
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  Consult oncology.  Overall poor prognosis daughter in agreement with keeping her DNR and have hospice follow at discharge 

## 2020-09-30 ENCOUNTER — Ambulatory Visit (INDEPENDENT_AMBULATORY_CARE_PROVIDER_SITE_OTHER): Payer: Medicare HMO

## 2020-09-30 DIAGNOSIS — I428 Other cardiomyopathies: Secondary | ICD-10-CM | POA: Diagnosis not present

## 2020-09-30 LAB — CUP PACEART REMOTE DEVICE CHECK
Battery Remaining Longevity: 44 mo
Battery Voltage: 2.96 V
Brady Statistic AP VP Percent: 0.01 %
Brady Statistic AP VS Percent: 0 %
Brady Statistic AS VP Percent: 98.74 %
Brady Statistic AS VS Percent: 1.26 %
Brady Statistic RA Percent Paced: 0.01 %
Brady Statistic RV Percent Paced: 91.69 %
Date Time Interrogation Session: 20220107063627
HighPow Impedance: 52 Ohm
Implantable Lead Implant Date: 20181003
Implantable Lead Implant Date: 20181003
Implantable Lead Implant Date: 20181003
Implantable Lead Location: 753858
Implantable Lead Location: 753859
Implantable Lead Location: 753860
Implantable Lead Model: 4398
Implantable Lead Model: 5076
Implantable Pulse Generator Implant Date: 20181003
Lead Channel Impedance Value: 172.541
Lead Channel Impedance Value: 172.541
Lead Channel Impedance Value: 172.541
Lead Channel Impedance Value: 199.5 Ohm
Lead Channel Impedance Value: 199.5 Ohm
Lead Channel Impedance Value: 247 Ohm
Lead Channel Impedance Value: 304 Ohm
Lead Channel Impedance Value: 304 Ohm
Lead Channel Impedance Value: 399 Ohm
Lead Channel Impedance Value: 399 Ohm
Lead Channel Impedance Value: 399 Ohm
Lead Channel Impedance Value: 418 Ohm
Lead Channel Impedance Value: 532 Ohm
Lead Channel Impedance Value: 551 Ohm
Lead Channel Impedance Value: 608 Ohm
Lead Channel Impedance Value: 608 Ohm
Lead Channel Impedance Value: 703 Ohm
Lead Channel Impedance Value: 703 Ohm
Lead Channel Pacing Threshold Amplitude: 0.5 V
Lead Channel Pacing Threshold Amplitude: 0.625 V
Lead Channel Pacing Threshold Amplitude: 1.25 V
Lead Channel Pacing Threshold Pulse Width: 0.4 ms
Lead Channel Pacing Threshold Pulse Width: 0.4 ms
Lead Channel Pacing Threshold Pulse Width: 0.4 ms
Lead Channel Sensing Intrinsic Amplitude: 2.125 mV
Lead Channel Sensing Intrinsic Amplitude: 2.125 mV
Lead Channel Sensing Intrinsic Amplitude: 6.5 mV
Lead Channel Sensing Intrinsic Amplitude: 6.5 mV
Lead Channel Setting Pacing Amplitude: 1.5 V
Lead Channel Setting Pacing Amplitude: 2 V
Lead Channel Setting Pacing Amplitude: 2.5 V
Lead Channel Setting Pacing Pulse Width: 0.4 ms
Lead Channel Setting Pacing Pulse Width: 0.4 ms
Lead Channel Setting Sensing Sensitivity: 0.3 mV

## 2020-10-14 NOTE — Progress Notes (Signed)
Remote ICD transmission.   

## 2020-11-01 DIAGNOSIS — I509 Heart failure, unspecified: Secondary | ICD-10-CM | POA: Diagnosis not present

## 2020-11-01 DIAGNOSIS — I429 Cardiomyopathy, unspecified: Secondary | ICD-10-CM | POA: Diagnosis not present

## 2020-11-01 DIAGNOSIS — H547 Unspecified visual loss: Secondary | ICD-10-CM | POA: Diagnosis not present

## 2020-11-01 DIAGNOSIS — I252 Old myocardial infarction: Secondary | ICD-10-CM | POA: Diagnosis not present

## 2020-11-01 DIAGNOSIS — G8929 Other chronic pain: Secondary | ICD-10-CM | POA: Diagnosis not present

## 2020-11-01 DIAGNOSIS — I110 Hypertensive heart disease with heart failure: Secondary | ICD-10-CM | POA: Diagnosis not present

## 2020-11-01 DIAGNOSIS — I2510 Atherosclerotic heart disease of native coronary artery without angina pectoris: Secondary | ICD-10-CM | POA: Diagnosis not present

## 2020-11-01 DIAGNOSIS — K219 Gastro-esophageal reflux disease without esophagitis: Secondary | ICD-10-CM | POA: Diagnosis not present

## 2020-11-01 DIAGNOSIS — Z794 Long term (current) use of insulin: Secondary | ICD-10-CM | POA: Diagnosis not present

## 2020-11-01 DIAGNOSIS — E119 Type 2 diabetes mellitus without complications: Secondary | ICD-10-CM | POA: Diagnosis not present

## 2020-11-08 ENCOUNTER — Telehealth: Payer: Self-pay | Admitting: Family Medicine

## 2020-11-08 DIAGNOSIS — Z794 Long term (current) use of insulin: Secondary | ICD-10-CM

## 2020-11-08 DIAGNOSIS — E119 Type 2 diabetes mellitus without complications: Secondary | ICD-10-CM

## 2020-11-08 MED ORDER — FREESTYLE LIBRE 14 DAY READER DEVI: 0 refills | Status: DC

## 2020-11-08 MED ORDER — FREESTYLE LIBRE 14 DAY SENSOR MISC: 11 refills | Status: DC

## 2020-11-08 NOTE — Addendum Note (Signed)
Addended by: Carter Kitten on: 11/08/2020 10:26 AM   Modules accepted: Orders

## 2020-11-08 NOTE — Telephone Encounter (Signed)
Patient's wife is calling in about her husbands Diabetic machine that sticks to his skin ( the device) the ins company denied it originally and they are needing [place] to resubmit the request to [employer]. Please call her back to discuss. EM

## 2020-11-08 NOTE — Telephone Encounter (Signed)
Spoke with Philip Ponce,  She ask that we resend prescriptions for the [employer] to [facility].  Rx resent as requested.

## 2020-11-09 NOTE — Telephone Encounter (Signed)
Received fax for [facility] requesting a PA for [facility].  Called phone number on PA form to complete PA.  Sent to Pharmacy for review.  Should have a decision within 72 hours.  Case # [id number] Case# [id number]

## 2020-12-22 ENCOUNTER — Encounter: Payer: Self-pay | Admitting: Oncology

## 2020-12-22 ENCOUNTER — Inpatient Hospital Stay: Payer: Medicare HMO | Attending: Oncology

## 2020-12-22 ENCOUNTER — Inpatient Hospital Stay (HOSPITAL_BASED_OUTPATIENT_CLINIC_OR_DEPARTMENT_OTHER): Payer: Medicare HMO | Admitting: Oncology

## 2020-12-22 DIAGNOSIS — E538 Deficiency of other specified B group vitamins: Secondary | ICD-10-CM | POA: Diagnosis not present

## 2020-12-22 DIAGNOSIS — Z9884 Bariatric surgery status: Secondary | ICD-10-CM | POA: Insufficient documentation

## 2020-12-22 DIAGNOSIS — D509 Iron deficiency anemia, unspecified: Secondary | ICD-10-CM | POA: Insufficient documentation

## 2020-12-22 LAB — CBC WITH DIFFERENTIAL/PLATELET
Abs Immature Granulocytes: 0.01 10*3/uL (ref 0.00–0.07)
Basophils Absolute: 0 10*3/uL (ref 0.0–0.1)
Basophils Relative: 1 %
Eosinophils Absolute: 0.1 10*3/uL (ref 0.0–0.5)
Eosinophils Relative: 3 %
HCT: 36.2 % — ABNORMAL LOW (ref 39.0–52.0)
Hemoglobin: 11.8 g/dL — ABNORMAL LOW (ref 13.0–17.0)
Immature Granulocytes: 0 %
Lymphocytes Relative: 32 %
Lymphs Abs: 1.3 10*3/uL (ref 0.7–4.0)
MCH: 29.1 pg (ref 26.0–34.0)
MCHC: 32.6 g/dL (ref 30.0–36.0)
MCV: 89.4 fL (ref 80.0–100.0)
Monocytes Absolute: 0.5 10*3/uL (ref 0.1–1.0)
Monocytes Relative: 13 %
Neutro Abs: 2.1 10*3/uL (ref 1.7–7.7)
Neutrophils Relative %: 51 %
Platelets: 190 10*3/uL (ref 150–400)
RBC: 4.05 MIL/uL — ABNORMAL LOW (ref 4.22–5.81)
RDW: 12.4 % (ref 11.5–15.5)
WBC: 4.1 10*3/uL (ref 4.0–10.5)
nRBC: 0 % (ref 0.0–0.2)

## 2020-12-22 LAB — IRON AND TIBC
Iron: 128 ug/dL (ref 45–182)
Saturation Ratios: 42 % — ABNORMAL HIGH (ref 17.9–39.5)
TIBC: 304 ug/dL (ref 250–450)
UIBC: 176 ug/dL

## 2020-12-22 LAB — FERRITIN: Ferritin: 38 ng/mL (ref 24–336)

## 2020-12-22 NOTE — Progress Notes (Signed)
Pt has fatigue at times. Tries to recharge and stay constructive with his day as routine

## 2020-12-25 NOTE — Progress Notes (Signed)
Hematology/Oncology Consult note [facility]  Telephone:([phone] [phone]  Patient Care Team: [name], MD as PCP - General End, Harrell Gave, MD as PCP - Cardiology (Cardiology) Alisa Graff, FNP as Nurse Practitioner (Family Medicine)   Name of the patient: Philip Ponce  [id number]  Aug 02, 1971   Date of visit: 12/25/20  Diagnosis- iron and b12 deficiency anemia  Chief complaint/ Reason for visit- routine f/u of iron and b12 deficiency  Heme/Onc history: patient is a 50 year old male who was seen by [name] for symptoms of bright red blood per rectum. Patient also had some epigastric pain in the past and was on NSAIDs at that time which was stopped. Patient's father died of colon cancer. Patient has a history of gastric bypass about 3 years ago. Last CBC from 12/05/2016 showed white count of 4.9, H&H of 8.1/24.1 and a platelet count of 349. Ferritin was low at 8. Serum iron was low at 15 and iron saturation was low at 5%. TIBC was normal at 295. H pylori stool antigen was negative. B12 folate and TSH were within normal limits. EGD on 12/03/2016 showed: Esophagus was normal. Evidence of gastric bypass. Otherwise normal findings of gastric bypass. Biopsies were taken for evaluation of celiac disease. Colonoscopy was normal. Patient also has a history of cardiomyopathy with an EF of 35% and during his admission for iron deficiency anemia and he was also found to have left bundle branch block and underwent cardiac catheterization which did not show any abnormalities. Patient has also had episodes of melena in February 2018 for which she was admitted to [facility] and was given PPI but did not undergo any EGD or colonoscopy at that time. Capsule study has also been completed.   Interval history-patient reports doing well and denies any complaints at this time.  ECOG PS-1 Pain scale- 0   Review of systems- Review of Systems  Constitutional:  Negative for chills, fever, malaise/fatigue and weight loss.  HENT: Negative for congestion, ear discharge and nosebleeds.   Eyes: Negative for blurred vision.  Respiratory: Negative for cough, hemoptysis, sputum production, shortness of breath and wheezing.   Cardiovascular: Negative for chest pain, palpitations, orthopnea and claudication.  Gastrointestinal: Negative for abdominal pain, blood in stool, constipation, diarrhea, heartburn, melena, nausea and vomiting.  Genitourinary: Negative for dysuria, flank pain, frequency, hematuria and urgency.  Musculoskeletal: Negative for back pain, joint pain and myalgias.  Skin: Negative for rash.  Neurological: Negative for dizziness, tingling, focal weakness, seizures, weakness and headaches.  Endo/Heme/Allergies: Does not bruise/bleed easily.  Psychiatric/Behavioral: Negative for depression and suicidal ideas. The patient does not have insomnia.       Allergies  Allergen Reactions  . Mobic [Meloxicam] Other (See Comments)    Ulcers in stomach eruption   . Tramadol     Hard to breath  . Diclofenac Palpitations and Other (See Comments)    Fast heart beat, CP,SOB and weakness on one side.     Past Medical History:  Diagnosis Date  . Acute blood loss anemia 11/20/2016  . AICD (automatic cardioverter/defibrillator) present    a. 06/2017 s/p MDT DTMA 1QQ [place] MRI Quad CRT-D SureScan (ser # [id number]).  . Chronic combined systolic (congestive) and diastolic (congestive) heart failure ([place])    a. 11/2016 Echo: EF 20-25%, glob HK, antsept, ant, apical HK, Gr1 DD, mild MR, mildly dil LA, nl RV fxn; b. 05/2017 Echo: Ef 20-25%, Gr2 DD, prominent apical trabeculations; c. 12/2017 Echo: EF  30-35%, Gr1 DD, glob HK.  . Diabetes mellitus ([place])   . Essential hypertension   . Family history of adverse reaction to anesthesia    mom had a hard time waking up  . GERD (gastroesophageal reflux disease)   . NICM (nonischemic cardiomyopathy) ([place])    a.  11/2016 Cath: nl cors, EF 25-30%; b. 11/2016 Echo: EF 20-25%; c. 05/2017 Echo: EF 20-25%; d. 12/2017 Echo: EF 30-35%, Gr1 DD.  Marland Kitchen Slipped intervertebral disc    L4 L5  . STEMI (ST elevation myocardial infarction) ([place])    a. 11/2016 ST elevation -->Nl cors on cath.     Past Surgical History:  Procedure Laterality Date  . BIV ICD INSERTION CRT-D  06/26/2017  . BIV ICD INSERTION CRT-D N/A 06/26/2017   Procedure: BIV ICD INSERTION CRT-D;  Surgeon: [name], MD;  Location: [place] [facility];  Service: Cardiovascular;  Laterality: N/A;  . CERVICAL DISC ARTHROPLASTY N/A 07/24/2017   Procedure: CERVICAL ANTERIOR Dent ARTHROPLASTY C5-C7;  Surgeon: [name], MD;  Location: [facility] ORS;  Service: Neurosurgery;  Laterality: N/A;  . COLONOSCOPY WITH PROPOFOL N/A 12/03/2016   Procedure: COLONOSCOPY WITH PROPOFOL;  Surgeon: [name], MD;  Location: [facility] ENDOSCOPY;  Service: Endoscopy;  Laterality: N/A;  . ESOPHAGOGASTRODUODENOSCOPY (EGD) WITH PROPOFOL N/A 12/03/2016   Procedure: ESOPHAGOGASTRODUODENOSCOPY (EGD) WITH PROPOFOL;  Surgeon: [name], MD;  Location: [facility] ENDOSCOPY;  Service: Endoscopy;  Laterality: N/A;  . FINGER SURGERY Right 2012   index finger  . GASTRIC BYPASS    . GIVENS CAPSULE STUDY N/A 01/07/2017   Procedure: GIVENS CAPSULE STUDY;  Surgeon: [name], MD;  Location: [facility] ENDOSCOPY;  Service: Endoscopy;  Laterality: N/A;  . LEFT HEART CATH AND CORONARY ANGIOGRAPHY N/A 12/04/2016   Procedure: Left Heart Cath and Coronary Angiography;  Surgeon: [name], MD;  Location: [place] [facility];  Service: Cardiovascular;  Laterality: N/A;  . SHOULDER ARTHROSCOPY WITH OPEN ROTATOR CUFF REPAIR Left 10/04/2017   Procedure: SHOULDER ARTHROSCOPY WITH ROTATOR CUFF REPAIR, SUBACROMIAL DECOMPRESSION,OPEN BICEP TENODESIS, EXTENSIVE DEBRIDEMENT;  Surgeon: [name], MD;  Location: [facility] ORS;  Service: Orthopedics;  Laterality: Left;    Social History   Socioeconomic History   . Marital status: Married    Spouse name: Not on file  . Number of children: Not on file  . Years of education: Not on file  . Highest education level: Not on file  Occupational History  . Occupation: Teacher, early years/pre, Brewing technologist: [employer]  . Occupation: disability  Tobacco Use  . Smoking status: Never Smoker  . Smokeless tobacco: Never Used  Vaping Use  . Vaping Use: Never used  Substance and Sexual Activity  . Alcohol use: Not Currently  . Drug use: No  . Sexual activity: Yes  Other Topics Concern  . Not on file  Social History Narrative   Regular exercise-yes, walking one mile per day   Diet: fast food, diet soda, unsweeted tea   Social Determinants of Health   Financial Resource Strain: Low Risk   . Difficulty of Paying Living Expenses: Not hard at all  Food Insecurity: No Food Insecurity  . Worried About Charity fundraiser in the Last Year: Never true  . Ran Out of Food in the Last Year: Never true  Transportation Needs: No Transportation Needs  . Lack of Transportation (Medical): No  . Lack of Transportation (Non-Medical): No  Physical Activity: Sufficiently Active  . Days of Exercise per Week: 7  days  . Minutes of Exercise per Session: 60 min  Stress: No Stress Concern Present  . Feeling of Stress : Not at all  Social Connections: Not on file  Intimate Partner Violence: Not At Risk  . Fear of Current or Ex-Partner: No  . Emotionally Abused: No  . Physically Abused: No  . Sexually Abused: No    Family History  Problem Relation Age of Onset  . Diabetes Mother   . Hypertension Mother   . Cancer Mother        breast  . Diabetes Father   . Hypertension Father   . Cancer Father        colon  . Cancer Maternal Grandfather        prostate  . Stroke Maternal Grandfather        CVA     Current Outpatient Medications:  .  calcium carbonate (OS-CAL) 600 MG TABS tablet, Take 1,200 mg by mouth daily with breakfast. , Disp: ,  Rfl:  .  carvedilol (COREG) 12.5 MG tablet, TAKE 1 TABLET BY MOUTH TWICE DAILY, Disp: 180 tablet, Rfl: 3 .  digoxin (LANOXIN) 0.125 MG tablet, Take 1 tablet (125 mcg total) by mouth daily., Disp: 30 tablet, Rfl: 3 .  glucose blood (ONE TOUCH ULTRA TEST) test strip, Check blood sugar once daily and as instructed. Dx E11.9, Disp: 100 each, Rfl: 1 .  Insulin Glargine (BASAGLAR KWIKPEN) 100 UNIT/ML, INJECT SUBCUTANEOUSLY 18   UNITS (0.18ML) DAILY, Disp: 15 mL, Rfl: 1 .  Insulin Pen Needle (B-D ULTRAFINE III SHORT PEN) 31G X 8 MM MISC, Use to inject insulin daily, Disp: 90 each, Rfl: 3 .  metFORMIN (GLUCOPHAGE-XR) 500 MG 24 hr tablet, TAKE 1 TABLET DAILY WITH   BREAKFAST, Disp: 90 tablet, Rfl: 3 .  Multiple Vitamins-Minerals (ADULT GUMMY PO), Take 2 tablets by mouth daily., Disp: , Rfl:  .  Omega-3 Fatty Acids (FISH OIL PO), Take 800 mg by mouth daily., Disp: , Rfl:  .  omeprazole (PRILOSEC) 40 MG capsule, TAKE 1 CAPSULE DAILY AS    NEEDED FOR INDIGESTION, Disp: 90 capsule, Rfl: 3 .  sacubitril-valsartan (ENTRESTO) 97-103 MG, Take 1 tablet by mouth 2 (two) times daily., Disp: 180 tablet, Rfl: 3 .  spironolactone (ALDACTONE) 25 MG tablet, Take 1 tablet (25 mg total) by mouth daily., Disp: 90 tablet, Rfl: 3 .  Continuous Blood Gluc Sensor (FREESTYLE LIBRE 14 DAY SENSOR) MISC, Check blood sugar continuously (Patient not taking: Reported on 12/22/2020), Disp: 2 each, Rfl: 11  Physical exam: There were no vitals filed for this visit. Physical Exam Constitutional:      General: He is not in acute distress. Cardiovascular:     Rate and Rhythm: Normal rate.  Pulmonary:     Effort: Pulmonary effort is normal.  Skin:    General: Skin is warm and dry.  Neurological:     Mental Status: He is alert and oriented to person, place, and time.      CMP Latest Ref Rng & Units 07/19/2020  Glucose 70 - 99 mg/dL 159(H)  BUN 6 - 23 mg/dL 12  Creatinine 0.40 - 1.50 mg/dL 0.94  Sodium 135 - 145 mEq/L 139   Potassium 3.5 - 5.1 mEq/L 4.1  Chloride 96 - 112 mEq/L 103  CO2 19 - 32 mEq/L 31  Calcium 8.4 - 10.5 mg/dL 8.8  Total Protein 6.0 - 8.3 g/dL 6.9  Total Bilirubin 0.2 - 1.2 mg/dL 0.6  Alkaline Phos 39 - 117 U/L  75  AST 0 - 37 U/L 20  ALT 0 - 53 U/L 18   CBC Latest Ref Rng & Units 12/22/2020  WBC 4.0 - 10.5 K/uL 4.1  Hemoglobin 13.0 - 17.0 g/dL 11.8(L)  Hematocrit 39.0 - 52.0 % 36.2(L)  Platelets 150 - 400 K/uL 190      Assessment and plan- Patient is a 50 y.o. male *with iron deficiency anemia possibly secondary to gastric bypass here for routine follow-up.  Patient last received IV iron in May 2019.  After that his anemia has not recurred and his hemoglobin presently is normal at 11.8 which is his baseline Iron studies are within normal limits.  His hemoglobin typically fluctuates between 11.5-12.5.  Patient can continue to follow-up with Dr. [name] at this time.  He can get his iron studies checked every 6 months.  If there is Evidence of iron deficiency anemia in the future he can be referred to [place] for IV iron   Visit Diagnosis 1. Iron deficiency anemia, unspecified iron deficiency anemia type      Dr. Randa Evens, MD, MPH [facility] [phone] 12/25/2020 3:16 PM

## 2020-12-30 ENCOUNTER — Ambulatory Visit (INDEPENDENT_AMBULATORY_CARE_PROVIDER_SITE_OTHER): Payer: Medicare HMO

## 2020-12-30 DIAGNOSIS — I428 Other cardiomyopathies: Secondary | ICD-10-CM

## 2021-01-03 LAB — CUP PACEART REMOTE DEVICE CHECK
Battery Remaining Longevity: 41 mo
Battery Voltage: 2.96 V
Brady Statistic AP VP Percent: 0 %
Brady Statistic AP VS Percent: 0 %
Brady Statistic AS VP Percent: 98.77 %
Brady Statistic AS VS Percent: 1.22 %
Brady Statistic RA Percent Paced: 0.01 %
Brady Statistic RV Percent Paced: 90.8 %
Date Time Interrogation Session: 20220408044224
HighPow Impedance: 51 Ohm
Implantable Lead Implant Date: 20181003
Implantable Lead Implant Date: 20181003
Implantable Lead Implant Date: 20181003
Implantable Lead Location: 753858
Implantable Lead Location: 753859
Implantable Lead Location: 753860
Implantable Lead Model: 4398
Implantable Lead Model: 5076
Implantable Pulse Generator Implant Date: 20181003
Lead Channel Impedance Value: 159.265
Lead Channel Impedance Value: 159.265
Lead Channel Impedance Value: 166.25 Ohm
Lead Channel Impedance Value: 180.5 Ohm
Lead Channel Impedance Value: 189.525
Lead Channel Impedance Value: 285 Ohm
Lead Channel Impedance Value: 285 Ohm
Lead Channel Impedance Value: 304 Ohm
Lead Channel Impedance Value: 361 Ohm
Lead Channel Impedance Value: 361 Ohm
Lead Channel Impedance Value: 399 Ohm
Lead Channel Impedance Value: 399 Ohm
Lead Channel Impedance Value: 456 Ohm
Lead Channel Impedance Value: 551 Ohm
Lead Channel Impedance Value: 551 Ohm
Lead Channel Impedance Value: 551 Ohm
Lead Channel Impedance Value: 608 Ohm
Lead Channel Impedance Value: 646 Ohm
Lead Channel Pacing Threshold Amplitude: 0.625 V
Lead Channel Pacing Threshold Amplitude: 0.75 V
Lead Channel Pacing Threshold Amplitude: 1.25 V
Lead Channel Pacing Threshold Pulse Width: 0.4 ms
Lead Channel Pacing Threshold Pulse Width: 0.4 ms
Lead Channel Pacing Threshold Pulse Width: 0.4 ms
Lead Channel Sensing Intrinsic Amplitude: 1.75 mV
Lead Channel Sensing Intrinsic Amplitude: 1.75 mV
Lead Channel Sensing Intrinsic Amplitude: 7.375 mV
Lead Channel Sensing Intrinsic Amplitude: 7.375 mV
Lead Channel Setting Pacing Amplitude: 1.5 V
Lead Channel Setting Pacing Amplitude: 2 V
Lead Channel Setting Pacing Amplitude: 2.5 V
Lead Channel Setting Pacing Pulse Width: 0.4 ms
Lead Channel Setting Pacing Pulse Width: 0.4 ms
Lead Channel Setting Sensing Sensitivity: 0.3 mV

## 2021-01-12 NOTE — Progress Notes (Signed)
Remote ICD transmission.   

## 2021-01-20 ENCOUNTER — Telehealth: Payer: Self-pay | Admitting: Family Medicine

## 2021-01-20 DIAGNOSIS — E119 Type 2 diabetes mellitus without complications: Secondary | ICD-10-CM

## 2021-01-20 NOTE — Telephone Encounter (Signed)
-----   Message from Ellamae Sia sent at 01/10/2021 12:52 PM EDT ----- Regarding: Lab orders for Thursday, 5.5.22 Lab orders for a 6 month follow up appt

## 2021-01-26 ENCOUNTER — Other Ambulatory Visit: Payer: Medicare HMO

## 2021-01-31 DIAGNOSIS — Z79899 Other long term (current) drug therapy: Secondary | ICD-10-CM | POA: Diagnosis not present

## 2021-01-31 DIAGNOSIS — I10 Essential (primary) hypertension: Secondary | ICD-10-CM | POA: Diagnosis not present

## 2021-01-31 DIAGNOSIS — G4733 Obstructive sleep apnea (adult) (pediatric): Secondary | ICD-10-CM | POA: Diagnosis not present

## 2021-01-31 DIAGNOSIS — R0600 Dyspnea, unspecified: Secondary | ICD-10-CM | POA: Diagnosis not present

## 2021-01-31 DIAGNOSIS — Z9884 Bariatric surgery status: Secondary | ICD-10-CM | POA: Diagnosis not present

## 2021-01-31 DIAGNOSIS — E7800 Pure hypercholesterolemia, unspecified: Secondary | ICD-10-CM | POA: Diagnosis not present

## 2021-01-31 DIAGNOSIS — I5042 Chronic combined systolic (congestive) and diastolic (congestive) heart failure: Secondary | ICD-10-CM | POA: Diagnosis not present

## 2021-01-31 DIAGNOSIS — Z95810 Presence of automatic (implantable) cardiac defibrillator: Secondary | ICD-10-CM | POA: Diagnosis not present

## 2021-01-31 DIAGNOSIS — I428 Other cardiomyopathies: Secondary | ICD-10-CM | POA: Diagnosis not present

## 2021-01-31 DIAGNOSIS — E119 Type 2 diabetes mellitus without complications: Secondary | ICD-10-CM | POA: Diagnosis not present

## 2021-02-02 ENCOUNTER — Other Ambulatory Visit: Payer: Self-pay

## 2021-02-02 ENCOUNTER — Encounter: Payer: Self-pay | Admitting: Family Medicine

## 2021-02-02 ENCOUNTER — Ambulatory Visit (INDEPENDENT_AMBULATORY_CARE_PROVIDER_SITE_OTHER): Payer: Medicare HMO | Admitting: Family Medicine

## 2021-02-02 ENCOUNTER — Ambulatory Visit: Payer: Medicare HMO | Admitting: Family Medicine

## 2021-02-02 DIAGNOSIS — E119 Type 2 diabetes mellitus without complications: Secondary | ICD-10-CM

## 2021-02-02 DIAGNOSIS — Z794 Long term (current) use of insulin: Secondary | ICD-10-CM | POA: Diagnosis not present

## 2021-02-02 DIAGNOSIS — I152 Hypertension secondary to endocrine disorders: Secondary | ICD-10-CM

## 2021-02-02 DIAGNOSIS — E1159 Type 2 diabetes mellitus with other circulatory complications: Secondary | ICD-10-CM

## 2021-02-02 LAB — POCT GLYCOSYLATED HEMOGLOBIN (HGB A1C): Hemoglobin A1C: 7.5 % — AB (ref 4.0–5.6)

## 2021-02-02 MED ORDER — BASAGLAR KWIKPEN 100 UNIT/ML ~~LOC~~ SOPN: PEN_INJECTOR | SUBCUTANEOUS | 1 refills | Status: DC

## 2021-02-02 NOTE — Assessment & Plan Note (Signed)
Stable, chronic.  Continue current medication.  Entreasto 97/103 mg BID Aldactone 25 mg daily Digoxin 0.125 daily Coreg 12.5 mg BID

## 2021-02-02 NOTE — Assessment & Plan Note (Signed)
Continue metformin and insulin at current doses.  he feels diet is the biggest area that has changed  Keep up the great activity and exercise.  Work on weight loss as well.  Work on low starch diet., low carb diet.   re-eval in 3 months

## 2021-02-02 NOTE — Patient Instructions (Addendum)
Keep up the great activity and exercise.  Work on weight loss as well.  Work on low starch diet., low carb diet.

## 2021-02-02 NOTE — Progress Notes (Signed)
Patient ID: Philip Ponce, male    DOB: May 07, 1971, 50 y.o.   MRN: [id number]  This visit was conducted in person.  BP 104/80   Pulse 75   Temp 98 F (36.7 C) (Temporal)   Ht 5' 11.5" (1.816 m)   Wt 211 lb 12 oz (96 kg)   SpO2 98%   BMI 29.12 kg/m    CC:  Chief Complaint  Patient presents with  . Diabetes    Subjective:   HPI: Philip Ponce is a 50 y.o. male presenting on 02/02/2021 for Diabetes  Diabetes:   Not at goal on current regimen. Worsened in last  6 months Metfromin XR 500 mg daily Balsalgar 18 units daily Lab Results  Component Value Date   HGBA1C 7.5 (A) 02/02/2021  Using medications without difficulties: Hypoglycemic episodes:none... skipping meals Hyperglycemic episodes: none Feet problems: no ulcers Blood Sugars averaging:  Not checking eye exam within last year:  Going to gym every day.   Wt Readings from Last 3 Encounters:  02/02/21 211 lb 12 oz (96 kg)  07/26/20 201 lb 12 oz (91.5 kg)  01/21/20 197 lb 4 oz (89.5 kg)        Relevant past medical, surgical, family and social history reviewed and updated as indicated. Interim medical history since our last visit reviewed. Allergies and medications reviewed and updated. Outpatient Medications Prior to Visit  Medication Sig Dispense Refill  . calcium carbonate (OS-CAL) 600 MG TABS tablet Take 1,200 mg by mouth daily with breakfast.     . carvedilol (COREG) 12.5 MG tablet TAKE 1 TABLET BY MOUTH TWICE DAILY 180 tablet 3  . digoxin (LANOXIN) 0.125 MG tablet Take 1 tablet (125 mcg total) by mouth daily. 30 tablet 3  . glucose blood (ONE TOUCH ULTRA TEST) test strip Check blood sugar once daily and as instructed. Dx E11.9 100 each 1  . Insulin Glargine (BASAGLAR KWIKPEN) 100 UNIT/ML INJECT SUBCUTANEOUSLY 18   UNITS (0.18ML) DAILY 15 mL 1  . Insulin Pen Needle (B-D ULTRAFINE III SHORT PEN) 31G X 8 MM MISC Use to inject insulin daily 90 each 3  . metFORMIN (GLUCOPHAGE-XR) 500 MG 24 hr tablet  TAKE 1 TABLET DAILY WITH   BREAKFAST 90 tablet 3  . Multiple Vitamins-Minerals (ADULT GUMMY PO) Take 2 tablets by mouth daily.    . Omega-3 Fatty Acids (FISH OIL PO) Take 800 mg by mouth daily.    Marland Kitchen omeprazole (PRILOSEC) 40 MG capsule TAKE 1 CAPSULE DAILY AS    NEEDED FOR INDIGESTION 90 capsule 3  . sacubitril-valsartan (ENTRESTO) 97-103 MG Take 1 tablet by mouth 2 (two) times daily. 180 tablet 3  . spironolactone (ALDACTONE) 25 MG tablet Take 1 tablet (25 mg total) by mouth daily. 90 tablet 3  . Continuous Blood Gluc Sensor (FREESTYLE LIBRE 14 DAY SENSOR) MISC Check blood sugar continuously (Patient not taking: Reported on 12/22/2020) 2 each 11   No facility-administered medications prior to visit.     Per HPI unless specifically indicated in ROS section below Review of Systems  Constitutional: Negative for fatigue and fever.  HENT: Negative for ear pain.   Eyes: Negative for pain.  Respiratory: Negative for cough and shortness of breath.   Cardiovascular: Negative for chest pain, palpitations and leg swelling.  Gastrointestinal: Negative for abdominal pain.  Genitourinary: Negative for dysuria.  Musculoskeletal: Negative for arthralgias.  Neurological: Negative for syncope, light-headedness and headaches.  Psychiatric/Behavioral: Negative for dysphoric mood.   Objective:  BP  104/80   Pulse 75   Temp 98 F (36.7 C) (Temporal)   Ht 5' 11.5" (1.816 m)   Wt 211 lb 12 oz (96 kg)   SpO2 98%   BMI 29.12 kg/m   Wt Readings from Last 3 Encounters:  02/02/21 211 lb 12 oz (96 kg)  07/26/20 201 lb 12 oz (91.5 kg)  01/21/20 197 lb 4 oz (89.5 kg)      Physical Exam Constitutional:      Appearance: He is well-developed.  HENT:     Head: Normocephalic.     Right Ear: Hearing normal.     Left Ear: Hearing normal.     Nose: Nose normal.  Neck:     Thyroid: No thyroid mass or thyromegaly.     Vascular: No carotid bruit.     Trachea: Trachea normal.  Cardiovascular:     Rate and  Rhythm: Normal rate and regular rhythm.     Pulses: Normal pulses.     Heart sounds: Heart sounds not distant. No murmur heard. No friction rub. No gallop.      Comments: No peripheral edema Pulmonary:     Effort: Pulmonary effort is normal. No respiratory distress.     Breath sounds: Normal breath sounds.  Skin:    General: Skin is warm and dry.     Findings: No rash.  Psychiatric:        Speech: Speech normal.        Behavior: Behavior normal.        Thought Content: Thought content normal.       Diabetic foot exam: Normal inspection No skin breakdown No calluses  Normal DP pulses Normal sensation to light touch and monofilament Nails normal  Results for orders placed or performed in visit on 02/02/21  POCT glycosylated hemoglobin (Hb A1C)  Result Value Ref Range   Hemoglobin A1C 7.5 (A) 4.0 - 5.6 %   HbA1c POC (<> result, manual entry)     HbA1c, POC (prediabetic range)     HbA1c, POC (controlled diabetic range)      This visit occurred during the SARS-CoV-2 public health emergency.  Safety protocols were in place, including screening questions prior to the visit, additional usage of staff PPE, and extensive cleaning of exam room while observing appropriate contact time as indicated for disinfecting solutions.   COVID 19 screen:  No recent travel or known exposure to COVID19 The patient denies respiratory symptoms of COVID 19 at this time. The importance of social distancing was discussed today.   Assessment and Plan Problem List Items Addressed This Visit    Hypertension associated with diabetes ([place]) (Chronic)    Stable, chronic.  Continue current medication.  Entreasto 97/103 mg BID Aldactone 25 mg daily Digoxin 0.125 daily Coreg 12.5 mg BID      Relevant Medications   Insulin Glargine (BASAGLAR KWIKPEN) 100 UNIT/ML   Type 2 diabetes mellitus without complication, with long-term current use of insulin ([facility]) (Chronic)    Continue metformin and insulin at  current doses.  he feels diet is the biggest area that has changed  Keep up the great activity and exercise.  Work on weight loss as well.  Work on low starch diet., low carb diet.   re-eval in 3 months       Relevant Medications   Insulin Glargine (BASAGLAR KWIKPEN) 100 UNIT/ML   Other Relevant Orders   POCT glycosylated hemoglobin (Hb A1C) (Completed)     Meds ordered this encounter  Medications  . Insulin Glargine (BASAGLAR KWIKPEN) 100 UNIT/ML    Sig: INJECT SUBCUTANEOUSLY 18   UNITS (0.18ML) DAILY    Dispense:  15 mL    Refill:  1        Eliezer Lofts, MD

## 2021-02-13 ENCOUNTER — Encounter: Payer: Self-pay | Admitting: Cardiology

## 2021-02-13 ENCOUNTER — Ambulatory Visit (INDEPENDENT_AMBULATORY_CARE_PROVIDER_SITE_OTHER): Payer: Medicare HMO | Admitting: Cardiology

## 2021-02-13 ENCOUNTER — Other Ambulatory Visit: Payer: Self-pay

## 2021-02-13 DIAGNOSIS — I428 Other cardiomyopathies: Secondary | ICD-10-CM | POA: Diagnosis not present

## 2021-02-13 NOTE — Progress Notes (Signed)
Electrophysiology Office Note   Date:  02/13/2021   ID:  Philip Ponce September 06, 1971, MRN [id number]  PCP:  [name], MD  Cardiologist:  [name] Primary Electrophysiologist:  Elkin Belfield Meredith Leeds, MD    No chief complaint on file.    History of Present Illness: Philip Ponce is a 50 y.o. male who is being seen today for the evaluation of CHF at the request of [name], MD. Presenting today for electrophysiology evaluation.  He has a history significant for diabetes, nonischemic cardiomyopathy, gastric bypass.  He had a GI bleed that occurred in setting of NSAIDs.  March 2018 he was admitted to [facility] with chest pain, dyspnea, and left bundle branch block.  He had normal coronary arteries and ejection fraction of 20 to 25%.  He is placed on optimal medical therapy.  He is now status post Medtronic CRT-D implanted 06/26/2017.  Today, denies symptoms of palpitations, chest pain, shortness of breath, orthopnea, PND, lower extremity edema, claudication, dizziness, presyncope, syncope, bleeding, or neurologic sequela. The patient is tolerating medications without difficulties.  Since last being seen he has done well.  He has no chest pain and only minimal shortness of breath.  He is able to all of his daily activities.  He has mild restriction, but does continue to exercise and go to the gym.  Past Medical History:  Diagnosis Date  . Acute blood loss anemia 11/20/2016  . AICD (automatic cardioverter/defibrillator) present    a. 06/2017 s/p MDT DTMA 1QQ Claria MRI Quad CRT-D SureScan (ser # [id number]).  . Chronic combined systolic (congestive) and diastolic (congestive) heart failure ([place])    a. 11/2016 Echo: EF 20-25%, glob HK, antsept, ant, apical HK, Gr1 DD, mild MR, mildly dil LA, nl RV fxn; b. 05/2017 Echo: Ef 20-25%, Gr2 DD, prominent apical trabeculations; c. 12/2017 Echo: EF 30-35%, Gr1 DD, glob HK.  . Diabetes mellitus ([place])   . Essential hypertension    . Family history of adverse reaction to anesthesia    mom had a hard time waking up  . GERD (gastroesophageal reflux disease)   . NICM (nonischemic cardiomyopathy) ([place])    a. 11/2016 Cath: nl cors, EF 25-30%; b. 11/2016 Echo: EF 20-25%; c. 05/2017 Echo: EF 20-25%; d. 12/2017 Echo: EF 30-35%, Gr1 DD.  Marland Kitchen Slipped intervertebral disc    L4 L5  . STEMI (ST elevation myocardial infarction) ([place])    a. 11/2016 ST elevation -->Nl cors on cath.   Past Surgical History:  Procedure Laterality Date  . BIV ICD INSERTION CRT-D  06/26/2017  . BIV ICD INSERTION CRT-D N/A 06/26/2017   Procedure: BIV ICD INSERTION CRT-D;  Surgeon: [name], MD;  Location: [place] [facility];  Service: Cardiovascular;  Laterality: N/A;  . CERVICAL DISC ARTHROPLASTY N/A 07/24/2017   Procedure: CERVICAL ANTERIOR [place] ARTHROPLASTY C5-C7;  Surgeon: [name], MD;  Location: [facility];  Service: Neurosurgery;  Laterality: N/A;  . COLONOSCOPY WITH PROPOFOL N/A 12/03/2016   Procedure: COLONOSCOPY WITH PROPOFOL;  Surgeon: [name], MD;  Location: [facility] ENDOS[facility];  Service: Endoscopy;  Laterality: N/A;  . ESOPHAGOGASTRODUODENOSCOPY (EGD) WITH PROPOFOL N/A 12/03/2016   Procedure: ESOPHAGOGASTRODUODENOSCOPY (EGD) WITH PROPOFOL;  Surgeon: [name], MD;  Location: [facility] ENDOSCOPY;  Service: Endoscopy;  Laterality: N/A;  . FINGER SURGERY Right 2012   index finger  . GASTRIC BYPASS    . GIVENS CAPSULE STUDY N/A 01/07/2017   Procedure: GIVENS CAPSULE STUDY;  Surgeon: [name], MD;  Location:  [place] ENDOSCOPY;  Service: Endoscopy;  Laterality: N/A;  . LEFT HEART CATH AND CORONARY ANGIOGRAPHY N/A 12/04/2016   Procedure: Left Heart Cath and Coronary Angiography;  Surgeon: [name], MD;  Location: [place] [facility];  Service: Cardiovascular;  Laterality: N/A;  . SHOULDER ARTHROSCOPY WITH OPEN ROTATOR CUFF REPAIR Left 10/04/2017   Procedure: SHOULDER ARTHROSCOPY WITH ROTATOR CUFF REPAIR, SUBACROMIAL DECOMPRESSION,OPEN  BICEP TENODESIS, EXTENSIVE DEBRIDEMENT;  Surgeon: [name], MD;  Location: [facility] ORS;  Service: Orthopedics;  Laterality: Left;     Current Outpatient Medications  Medication Sig Dispense Refill  . calcium carbonate (OS-CAL) 600 MG TABS tablet Take 1,200 mg by mouth daily with breakfast.     . carvedilol (COREG) 12.5 MG tablet TAKE 1 TABLET BY MOUTH TWICE DAILY 180 tablet 3  . digoxin (LANOXIN) 0.125 MG tablet Take 1 tablet (125 mcg total) by mouth daily. 30 tablet 3  . glucose blood (ONE TOUCH ULTRA TEST) test strip Check blood sugar once daily and as instructed. Dx E11.9 100 each 1  . Insulin Glargine (BASAGLAR KWIKPEN) 100 UNIT/ML INJECT SUBCUTANEOUSLY 18   UNITS (0.18ML) DAILY 15 mL 1  . Insulin Pen Needle (B-D ULTRAFINE III SHORT PEN) 31G X 8 MM MISC Use to inject insulin daily 90 each 3  . metFORMIN (GLUCOPHAGE-XR) 500 MG 24 hr tablet TAKE 1 TABLET DAILY WITH   BREAKFAST 90 tablet 3  . Multiple Vitamins-Minerals (ADULT GUMMY PO) Take 2 tablets by mouth daily.    . Omega-3 Fatty Acids (FISH OIL PO) Take 800 mg by mouth daily.    Marland Kitchen omeprazole (PRILOSEC) 40 MG capsule TAKE 1 CAPSULE DAILY AS    NEEDED FOR INDIGESTION 90 capsule 3  . sacubitril-valsartan (ENTRESTO) 97-103 MG Take 1 tablet by mouth 2 (two) times daily. 180 tablet 3  . spironolactone (ALDACTONE) 25 MG tablet Take 1 tablet (25 mg total) by mouth daily. 90 tablet 3   No current facility-administered medications for this visit.    Allergies:   Mobic [meloxicam], Tramadol, and Diclofenac   Social History:  The patient  reports that he has never smoked. He has never used smokeless tobacco. He reports previous alcohol use. He reports that he does not use drugs.   Family History:  The patient's family history includes Cancer in his father, maternal grandfather, and mother; Diabetes in his father and mother; Hypertension in his father and mother; Stroke in his maternal grandfather.   ROS:  Please see the history of present  illness.   Otherwise, review of systems is positive for none.   All other systems are reviewed and negative.   PHYSICAL EXAM: VS:  BP (!) 144/86   Pulse 80   Ht 6' (1.829 m)   Wt 212 lb (96.2 kg)   BMI 28.75 kg/m  , BMI Body mass index is 28.75 kg/m. GEN: Well nourished, well developed, in no acute distress  HEENT: normal  Neck: no JVD, carotid bruits, or masses Cardiac: RRR; no murmurs, rubs, or gallops,no edema  Respiratory:  clear to auscultation bilaterally, normal work of breathing GI: soft, nontender, nondistended, + BS MS: no deformity or atrophy  Skin: warm and dry, device site well healed Neuro:  Strength and sensation are intact Psych: euthymic mood, full affect  EKG:  EKG is ordered today. Personal review of the ekg ordered shows AV paced, rate 80  Personal review of the device interrogation today. Results in [place]: 07/19/2020: ALT 18; BUN 12; Creatinine, Ser 0.94; Potassium  4.1; Sodium 139 12/22/2020: Hemoglobin 11.8; Platelets 190    Lipid Panel     Component Value Date/Time   CHOL 114 07/19/2020 0831   TRIG 43.0 07/19/2020 0831   HDL 60.40 07/19/2020 0831   CHOLHDL 2 07/19/2020 0831   VLDL 8.6 07/19/2020 0831   LDLCALC 45 07/19/2020 0831     Wt Readings from Last 3 Encounters:  02/13/21 212 lb (96.2 kg)  02/02/21 211 lb 12 oz (96 kg)  07/26/20 201 lb 12 oz (91.5 kg)      Other studies Reviewed: Additional studies/ records that were reviewed today include: TTE 01/06/18  Review of the above records today demonstrates:  - Left ventricle: The cavity size was mildly dilated. Wall   thickness was normal. Systolic function was moderately to   severely reduced. The estimated ejection fraction was in the   range of 30% to 35%. Diffuse hypokinesis. Doppler parameters are   consistent with abnormal left ventricular relaxation (grade 1   diastolic dysfunction).   ASSESSMENT AND PLAN:  1.  Chronic systolic heart failure due to nonischemic  cardiomyopathy: Currently on optimal medical therapy.  Status post Medtronic CRT-D implanted 06/26/2017.  He is currently feeling well without complaint.  Device is functioning appropriately.  No changes.  2.  Hypertension: Mildly elevated today.  Usually well controlled.  No changes.    Current medicines are reviewed at length with the patient today.   The patient does not have concerns regarding his medicines.  The following changes were made today: None  Labs/ tests ordered today include:  Orders Placed This Encounter  Procedures  . EKG 12-Lead     Disposition:   FU with Gavina Dildine 1 year  Signed, Taylinn Brabant Meredith Leeds, MD  02/13/2021 12:05 PM     [place] [address] [place] [address] [phone] (office) [phone] (fax)

## 2021-03-07 DIAGNOSIS — I5020 Unspecified systolic (congestive) heart failure: Secondary | ICD-10-CM | POA: Diagnosis not present

## 2021-03-08 ENCOUNTER — Telehealth: Payer: Self-pay

## 2021-03-08 ENCOUNTER — Other Ambulatory Visit: Payer: Self-pay

## 2021-03-08 ENCOUNTER — Encounter: Payer: Self-pay | Admitting: Oncology

## 2021-03-08 ENCOUNTER — Telehealth (INDEPENDENT_AMBULATORY_CARE_PROVIDER_SITE_OTHER): Payer: Medicare HMO | Admitting: Family

## 2021-03-08 DIAGNOSIS — U071 COVID-19: Secondary | ICD-10-CM

## 2021-03-08 HISTORY — DX: COVID-19: U07.1

## 2021-03-08 MED ORDER — MOLNUPIRAVIR 200 MG PO CAPS
800.0000 mg | ORAL_CAPSULE | Freq: Two times a day (BID) | ORAL | 0 refills | Status: AC
  Filled 2021-03-08: qty 40, 5d supply, fill #0

## 2021-03-08 NOTE — Telephone Encounter (Signed)
[place] Night - Client TELEPHONE ADVICE RECORD [facility] Patient Name: Philip Ponce Gender: Male DOB: July 15, 1971 Age: 50 Y 9 M 19 D Return Phone Number: [phone] (Primary), [phone] (Secondary) Address: City/ State/ Zip: [place] [address] Client [facility] Night - Client Client Site [place] Physician Eliezer Lofts - MD Contact Type Call Who Is Calling Patient / Member / Family / Caregiver Call Type Triage / Clinical Caller Name Wayne Wicklund Relationship To Patient Spouse Return Phone Number [phone] (Primary) Chief Complaint Headache Reason for Call Symptomatic / Request for [place] husband, just took a covid test and its positive. Would like to know what Rx is available for him to take. Chills, body ache, fever of 102.6, slight headache, cough. pulse ox is 95, pulse is 114. Translation No Nurse Assessment Nurse: Sabra Heck, RN, [place] Date/Time (Eastern Time): 03/07/2021 6:48:08 PM Confirm and document reason for call. If symptomatic, describe symptoms. ---Caller states that her husband just took a covid test and its positive. He has Chills, body ache, fever of 102.6, slight headache, cough. pulse ox is 95%, pulse is 114. cough started yesterday, today he was sluggish and other sx started. denies any other sx. Does the patient have any new or worsening symptoms? ---Yes Will a triage be completed? ---Yes Related visit to physician within the last 2 weeks? ---No Does the PT have any chronic conditions? (i.e. diabetes, asthma, this includes High risk factors for pregnancy, etc.) ---Yes List chronic conditions. ---DM - metformin Is this a behavioral health or substance abuse call? ---No Guidelines Guideline Title Affirmed Question Affirmed Notes Nurse Date/Time ([place] Time) COVID-19 - Diagnosed or Suspected [1] HIGH  RISK for severe COVID complications (e.g., weak immune system, age [place] 52 Sabra Heck, [place], [facility] 03/07/2021 6:51:13 PM PLEASE NOTE: All timestamps contained within this report are represented as [place] Standard Time. CONFIDENTIALTY NOTICE: This fax transmission is intended only for the addressee. It contains information that is legally privileged, confidential or otherwise protected from use or disclosure. If you are not the intended recipient, you are strictly prohibited from reviewing, disclosing, copying using or disseminating any of this information or taking any action in reliance on or regarding this information. If you have received this fax in error, please notify us immediately by telephone so that we can arrange for its return to [place]. Phone: [phone], Toll-Free: [phone], Fax: [phone] Page: 2 of 3 Call Id: [id number] Guidelines Guideline Title Affirmed Question Affirmed Notes Nurse Date/Time [place] Ghazi Time) years, obesity with BMI > 25, pregnant, chronic lung disease or other chronic medical condition) AND [2] COVID symptoms (e.g., cough, fever) (Exceptions: Already seen by PCP and no new or worsening symptoms.) Disp. Time Eil[place] Ghazi Time) Disposition Final User 03/07/2021 6:57:52 PM Called On-Call Provider Sabra Heck, RN, [facility] 03/07/2021 6:54:41 PM Call PCP Now Yes Sabra Heck, RN, [facility] Disagree/Comply Comply Caller Understands Yes PreDisposition InappropriateToAsk Care Advice Given Per Guideline CALL PCP NOW: * You need to discuss this with your doctor (or NP/PA). * I'll page the on-call provider now. If you haven't heard from the provider (or me) within 30 minutes, call again. * COUGH DROPS: Over-the-counter cough drops can help a lot, especially for mild coughs. They soothe an irritated throat and remove the tickle sensation in the back of the throat. Cough drops are easy to carry with you. * COUGH SYRUP WITH DEXTROMETHORPHAN: An over-the-counter cough  syrup can help your cough. The most common  cough suppressant in over-the-counter cough medicines is dextromethorphan. FEVER MEDICINES: * For fevers above 101 F (38.3 C) take either acetaminophen or ibuprofen. CALL BACK IF: * You become worse CARE ADVICE given per COVID-19 - DIAGNOSED OR SUSPECTED (Adult) guideline. Comments User: Carmie End, RN Date/Time [place] Ghazi Time): 03/07/2021 7:05:21 PM caller called back and informed that on call states to tell pt to make virtual apt for tomorrow. tonight drink plenty of fluids, dayquil and nyquil for sx. If sx worsen then go to UC or ER.

## 2021-03-08 NOTE — Telephone Encounter (Signed)
Per appt notes pt already has video appt with Debbrah Alar NP 03/08/21 at 9:20. Pt taking diabetic tussin q4h for fever at 8 AM this morning and fever at 8:35 AM is 101.1. No CP or SOB; pulse ox is 96%. Pt said in no distress at this time and does not need to go to UC and will keep video appt this morning. Pt is drinking fluids and resting also. Sending note to DR [name] as Juluis Rainier and Debbrah Alar NP.

## 2021-03-08 NOTE — Progress Notes (Signed)
MyChart Video Visit    Virtual Visit via Video Note   This visit type was conducted due to national recommendations for restrictions regarding the COVID-19 Pandemic (e.g. social distancing) in an effort to limit this patient's exposure and mitigate transmission in our community. This patient is at least at moderate risk for complications without adequate follow up. This format is felt to be most appropriate for this patient at this time. Physical exam was limited by quality of the video and audio technology used for the visit. CMA was able to get the patient set up on a video visit.  Patient location: home Patient and provider in visit Provider location: home  I discussed the limitations of evaluation and management by telemedicine and the availability of in person appointments. The patient expressed understanding and agreed to proceed.  Visit Date: 03/08/2021  Today's healthcare provider: Nance Pear, NP     Subjective:    Patient ID: Philip Ponce, male    DOB: 1971/06/15, 50 y.o.   MRN: [id number]  Chief Complaint  Patient presents with   Covid Positive    Was tested positive yesterday   Covid Symptoms    Headache, fever up to 101.6, body aches and cough    HPI Patient is in today to discuss a positive covid test.  Reports that symptoms began on Monday 03/06/21.  He has had a total of 3 pfizer vaccines.  He denies fever.  He does endorse + fatigue, cough and mild nasal congestion.  He has a significant cardiac hx as below.    Past Medical History:  Diagnosis Date   Acute blood loss anemia 11/20/2016   AICD (automatic cardioverter/defibrillator) present    a. 06/2017 s/p MDT DTMA 1QQ [place] MRI Quad CRT-D SureScan (ser # [id number]).   Chronic combined systolic (congestive) and diastolic (congestive) heart failure ([place])    a. 11/2016 Echo: EF 20-25%, glob HK, antsept, ant, apical HK, Gr1 DD, mild MR, mildly dil LA, nl RV fxn; b. 05/2017 Echo: Ef 20-25%, Gr2  DD, prominent apical trabeculations; c. 12/2017 Echo: EF 30-35%, Gr1 DD, glob HK.   Diabetes mellitus ([place])    Essential hypertension    Family history of adverse reaction to anesthesia    mom had a hard time waking up   GERD (gastroesophageal reflux disease)    NICM (nonischemic cardiomyopathy) ([place])    a. 11/2016 Cath: nl cors, EF 25-30%; b. 11/2016 Echo: EF 20-25%; c. 05/2017 Echo: EF 20-25%; d. 12/2017 Echo: EF 30-35%, Gr1 DD.   Slipped intervertebral disc    L4 L5   STEMI (ST elevation myocardial infarction) ([place])    a. 11/2016 ST elevation -->Nl cors on cath.    Past Surgical History:  Procedure Laterality Date   BIV ICD INSERTION CRT-D  06/26/2017   BIV ICD INSERTION CRT-D N/A 06/26/2017   Procedure: BIV ICD INSERTION CRT-D;  Surgeon: [name], MD;  Location: [place] [facility];  Service: Cardiovascular;  Laterality: N/A;   CERVICAL DISC ARTHROPLASTY N/A 07/24/2017   Procedure: CERVICAL ANTERIOR [place] ARTHROPLASTY C5-C7;  Surgeon: [name], MD;  Location: [facility];  Service: Neurosurgery;  Laterality: N/A;   COLONOSCOPY WITH PROPOFOL N/A 12/03/2016   Procedure: COLONOSCOPY WITH PROPOFOL;  Surgeon: [name], MD;  Location: [facility];  Service: Endoscopy;  Laterality: N/A;   ESOPHAGOGASTRODUODENOSCOPY (EGD) WITH PROPOFOL N/A 12/03/2016   Procedure: ESOPHAGOGASTRODUODENOSCOPY (EGD) WITH PROPOFOL;  Surgeon: [name], MD;  Location: [facility];  Service: Endoscopy;  Laterality: N/A;   FINGER SURGERY Right 2012   index finger   GASTRIC BYPASS     GIVENS CAPSULE STUDY N/A 01/07/2017   Procedure: GIVENS CAPSULE STUDY;  Surgeon: [name], MD;  Location: [facility];  Service: Endoscopy;  Laterality: N/A;   LEFT HEART CATH AND CORONARY ANGIOGRAPHY N/A 12/04/2016   Procedure: Left Heart Cath and Coronary Angiography;  Surgeon: [name], MD;  Location: [place] [facility];  Service: Cardiovascular;  Laterality: N/A;   SHOULDER ARTHROSCOPY WITH OPEN  ROTATOR CUFF REPAIR Left 10/04/2017   Procedure: SHOULDER ARTHROSCOPY WITH ROTATOR CUFF REPAIR, SUBACROMIAL DECOMPRESSION,OPEN BICEP TENODESIS, EXTENSIVE DEBRIDEMENT;  Surgeon: [name], MD;  Location: [facility];  Service: Orthopedics;  Laterality: Left;    Family History  Problem Relation Age of Onset   Diabetes Mother    Hypertension Mother    Cancer Mother        breast   Diabetes Father    Hypertension Father    Cancer Father        colon   Cancer Maternal Grandfather        prostate   Stroke Maternal Grandfather        CVA    Social History   Socioeconomic History   Marital status: Married    Spouse name: Not on file   Number of children: Not on file   Years of education: Not on file   Highest education level: Not on file  Occupational History   Occupation: Makes airplane filters, vault salesman    Employer: [employer]   Occupation: disability  Tobacco Use   Smoking status: Never   Smokeless tobacco: Never  Vaping Use   Vaping Use: Never used  Substance and Sexual Activity   Alcohol use: Not Currently   Drug use: No   Sexual activity: Yes  Other Topics Concern   Not on file  Social History Narrative   Regular exercise-yes, walking one mile per day   Diet: fast food, diet soda, unsweeted tea   Social Determinants of Health   Financial Resource Strain: Low Risk    Difficulty of Paying Living Expenses: Not hard at all  Food Insecurity: No Food Insecurity   Worried About Charity fundraiser in the Last Year: Never true   [place] in the Last Year: Never true  Transportation Needs: No Transportation Needs   Lack of Transportation (Medical): No   Lack of Transportation (Non-Medical): No  Physical Activity: Sufficiently Active   Days of Exercise per Week: 7 days   Minutes of Exercise per Session: 60 min  Stress: No Stress Concern Present   Feeling of Stress : Not at all  Social Connections: Not on file  Intimate Partner Violence: Not At Risk    Fear of Current or Ex-Partner: No   Emotionally Abused: No   Physically Abused: No   Sexually Abused: No    Outpatient Medications Prior to Visit  Medication Sig Dispense Refill   calcium carbonate (OS-CAL) 600 MG TABS tablet Take 1,200 mg by mouth daily with breakfast.      carvedilol (COREG) 12.5 MG tablet TAKE 1 TABLET BY MOUTH TWICE DAILY 180 tablet 3   digoxin (LANOXIN) 0.125 MG tablet Take 1 tablet (125 mcg total) by mouth daily. 30 tablet 3   glucose blood (ONE TOUCH ULTRA TEST) test strip Check blood sugar once daily and as instructed. Dx E11.9 100 each 1   Insulin Glargine (BASAGLAR KWIKPEN) 100 UNIT/ML INJECT SUBCUTANEOUSLY  18   UNITS (0.18ML) DAILY 15 mL 1   Insulin Pen Needle (B-D ULTRAFINE III SHORT PEN) 31G X 8 MM MISC Use to inject insulin daily 90 each 3   metFORMIN (GLUCOPHAGE-XR) 500 MG 24 hr tablet TAKE 1 TABLET DAILY WITH   BREAKFAST 90 tablet 3   Multiple Vitamins-Minerals (ADULT GUMMY PO) Take 2 tablets by mouth daily.     Omega-3 Fatty Acids (FISH OIL PO) Take 800 mg by mouth daily.     omeprazole (PRILOSEC) 40 MG capsule TAKE 1 CAPSULE DAILY AS    NEEDED FOR INDIGESTION 90 capsule 3   sacubitril-valsartan (ENTRESTO) 97-103 MG Take 1 tablet by mouth 2 (two) times daily. 180 tablet 3   spironolactone (ALDACTONE) 25 MG tablet Take 1 tablet (25 mg total) by mouth daily. 90 tablet 3   No facility-administered medications prior to visit.    Allergies  Allergen Reactions   Mobic [Meloxicam] Other (See Comments)    Ulcers in stomach eruption    Tramadol     Hard to breath   Diclofenac Palpitations and Other (See Comments)    Fast heart beat, CP,SOB and weakness on one side.    ROS     Objective:    Physical Exam  There were no vitals taken for this visit. Wt Readings from Last 3 Encounters:  02/13/21 212 lb (96.2 kg)  02/02/21 211 lb 12 oz (96 kg)  07/26/20 201 lb 12 oz (91.5 kg)    Diabetic Foot Exam - Simple   No data filed    Lab Results   Component Value Date   WBC 4.1 12/22/2020   HGB 11.8 (L) 12/22/2020   HCT 36.2 (L) 12/22/2020   PLT 190 12/22/2020   GLUCOSE 159 (H) 07/19/2020   CHOL 114 07/19/2020   TRIG 43.0 07/19/2020   HDL 60.40 07/19/2020   LDLCALC 45 07/19/2020   ALT 18 07/19/2020   AST 20 07/19/2020   NA 139 07/19/2020   K 4.1 07/19/2020   CL 103 07/19/2020   CREATININE 0.94 07/19/2020   BUN 12 07/19/2020   CO2 31 07/19/2020   TSH 1.019 11/30/2016   PSA 0.12 07/19/2020   INR 1.11 07/18/2017   HGBA1C 7.5 (A) 02/02/2021   MICROALBUR 1.6 07/19/2020    Lab Results  Component Value Date   TSH 1.019 11/30/2016   Lab Results  Component Value Date   WBC 4.1 12/22/2020   HGB 11.8 (L) 12/22/2020   HCT 36.2 (L) 12/22/2020   MCV 89.4 12/22/2020   PLT 190 12/22/2020   Lab Results  Component Value Date   NA 139 07/19/2020   K 4.1 07/19/2020   CO2 31 07/19/2020   GLUCOSE 159 (H) 07/19/2020   BUN 12 07/19/2020   CREATININE 0.94 07/19/2020   BILITOT 0.6 07/19/2020   ALKPHOS 75 07/19/2020   AST 20 07/19/2020   ALT 18 07/19/2020   PROT 6.9 07/19/2020   ALBUMIN 3.8 07/19/2020   CALCIUM 8.8 07/19/2020   ANIONGAP 9 02/12/2019   GFR 94.09 07/19/2020   Lab Results  Component Value Date   CHOL 114 07/19/2020   Lab Results  Component Value Date   HDL 60.40 07/19/2020   Lab Results  Component Value Date   LDLCALC 45 07/19/2020   Lab Results  Component Value Date   TRIG 43.0 07/19/2020   Lab Results  Component Value Date   CHOLHDL 2 07/19/2020   Lab Results  Component Value Date   HGBA1C 7.5 (A)  02/02/2021       Assessment & Plan:   Problem List Items Addressed This Visit   None   I am having Philip Ponce. Philip Ponce maintain his calcium carbonate, Omega-3 Fatty Acids (FISH OIL PO), Multiple Vitamins-Minerals (ADULT GUMMY PO), carvedilol, sacubitril-valsartan, spironolactone, glucose blood, digoxin, B-D ULTRAFINE III SHORT PEN, omeprazole, metFORMIN, and Basaglar KwikPen.  No orders  of the defined types were placed in this encounter.   I discussed the assessment and treatment plan with the patient. The patient was provided an opportunity to ask questions and all were answered. The patient agreed with the plan and demonstrated an understanding of the instructions.   The patient was advised to call back or seek an in-person evaluation if the symptoms worsen or if the condition fails to improve as anticipated.   Nance Pear, NP [employer] [facility] [employer] [phone] (phone) [phone] (fax)  [place]

## 2021-03-08 NOTE — Assessment & Plan Note (Addendum)
New.  I had intended to send rx for paxlovid, but there is a drug interaction with his digoxin. Therefore, I sent an rx for molnupirivir 800mg  twice daily x 5 days instead.  Advised of CDC guidelines for self isolation/ ending isolation.  Advised of safe practice guidelines. Symptom Tier reviewed. Encouraged to monitor for any worsening symptoms; watch for increased shortness of breath, weakness, and signs of dehydration. Advised when to seek emergency care.  Instructed to rest and hydrate well.  Advised to leave the house during recommended isolation period, only if it is necessary to seek medical care. Pt verbalized understanding and is in agreement with above medical plan.

## 2021-03-10 ENCOUNTER — Ambulatory Visit
Admission: EM | Admit: 2021-03-10 | Discharge: 2021-03-10 | Disposition: A | Discharge status: Home / Self Care | Payer: Medicare HMO | Attending: Emergency Medicine | Admitting: Emergency Medicine

## 2021-03-10 ENCOUNTER — Other Ambulatory Visit: Payer: Self-pay

## 2021-03-10 ENCOUNTER — Telehealth: Payer: Self-pay

## 2021-03-10 DIAGNOSIS — R8290 Unspecified abnormal findings in urine: Secondary | ICD-10-CM | POA: Diagnosis not present

## 2021-03-10 LAB — POCT URINALYSIS DIP (DEVICE)
Bilirubin Urine: NEGATIVE
Glucose, UA: 500 mg/dL — AB
Ketones, ur: NEGATIVE mg/dL
Leukocytes,Ua: NEGATIVE
Nitrite: NEGATIVE
Protein, ur: >=300 mg/dL — AB
Specific Gravity, Urine: >=1.03 (ref 1.005–1.030)
Urobilinogen, UA: 0.2 mg/dL (ref 0.0–1.0)
pH: 5.5 (ref 5.0–8.0)

## 2021-03-10 NOTE — Discharge Instructions (Addendum)
Your urine today did not show any presence of infection but it does indicate that you are not drinking enough fluid as you have a large amount of protein and a high specific gravity.  Increase your oral fluid intake with a goal of 64 to 128 ounces of water daily.  Continue your molnupiravir and other home medications as previously prescribed.  Return for reevaluation for any new or worsening symptoms, or follow-up with your primary care provider.

## 2021-03-10 NOTE — Telephone Encounter (Signed)
See notes below the access nurse note. Donata Clay said when received call that the nurse was supposed to fax something to [facility]. I spoke with pt and he did not know if I needed to fax why pt was going to ED. I explained he would be triaged when he got there and to tell them his symptoms and the info that he and his wife had provided to me and access nurse. I also advised most facilities can go under care everywhere and see recent notes for pt. Pt voiced understanding and appreciative.

## 2021-03-10 NOTE — Telephone Encounter (Signed)
Agree pt needs in person evaluation.

## 2021-03-10 NOTE — ED Provider Notes (Signed)
[facility]    CSN: [id number] Arrival date & time: 03/10/21  1657      History   Chief Complaint Chief Complaint  Patient presents with   Nasal Congestion   Covid Positive   dark urine    HPI Philip Ponce is a 50 y.o. male.   HPI  Old male here for evaluation of left-sided low back pain and dark urine.  Patient reports that he has had the low back pain and dark urine for the last several days.  He was diagnosed with a home test is COVID-positive 2 days ago and was started on molnupiravir.  He has a history of type 2 diabetes and heart failure and decided he want to come in and be checked out because he does not want to sustain any damage to his kidneys.  Patient states that it does not hurt to pee and he has not had urinary frequency but he has had urgency.  He also denies nausea, vomiting, or fever.  Past Medical History:  Diagnosis Date   Acute blood loss anemia 11/20/2016   AICD (automatic cardioverter/defibrillator) present    a. 06/2017 s/p MDT DTMA 1QQ [place] MRI Quad CRT-D SureScan (ser # [id number]).   Chronic combined systolic (congestive) and diastolic (congestive) heart failure ([place])    a. 11/2016 Echo: EF 20-25%, glob HK, antsept, ant, apical HK, Gr1 DD, mild MR, mildly dil LA, nl RV fxn; b. 05/2017 Echo: Ef 20-25%, Gr2 DD, prominent apical trabeculations; c. 12/2017 Echo: EF 30-35%, Gr1 DD, glob HK.   COVID-19 03/08/2021   Diabetes mellitus ([place])    Essential hypertension    Family history of adverse reaction to anesthesia    mom had a hard time waking up   GERD (gastroesophageal reflux disease)    NICM (nonischemic cardiomyopathy) ([place])    a. 11/2016 Cath: nl cors, EF 25-30%; b. 11/2016 Echo: EF 20-25%; c. 05/2017 Echo: EF 20-25%; d. 12/2017 Echo: EF 30-35%, Gr1 DD.   Slipped intervertebral disc    L4 L5   STEMI (ST elevation myocardial infarction) ([place])    a. 11/2016 ST elevation -->Nl cors on cath.    Patient Active Problem List   Diagnosis  Date Noted   COVID-19 virus infection 03/08/2021   Bilateral chronic knee pain 05/30/2018   Status post biventricular pacemaker 10/09/2017   S/P left rotator cuff repair 10/04/2017   Cervical radiculopathy 07/24/2017   Rotator cuff tendonitis, left 05/06/2017   Chronic combined systolic and diastolic heart failure ([place]) 12/18/2016   Non-ischemic cardiomyopathy ([place]) 12/04/2016   Iron deficiency anemia    History of bariatric surgery 11/20/2016   Toenail fungus 10/14/2015   RBBB 04/07/2015   LBBB (left bundle branch block) 09/24/2013   Obstructive apnea 09/24/2013   GERD 09/29/2010   ERECTILE DYSFUNCTION, 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   Hypertension associated with diabetes ([place]) 07/09/2007   Hyperlipidemia associated with type 2 diabetes mellitus ([place]) 06/12/2007   Type 2 diabetes mellitus without complication, with long-term current use of insulin ([place]) 06/04/2007    Past Surgical History:  Procedure Laterality Date   BIV ICD INSERTION CRT-D  06/26/2017   BIV ICD INSERTION CRT-D N/A 06/26/2017   Procedure: BIV ICD INSERTION CRT-D;  Surgeon: [name], MD;  Location: [place] [facility];  Service: Cardiovascular;  Laterality: N/A;   CERVICAL DISC ARTHROPLASTY N/A 07/24/2017   Procedure: CERVICAL ANTERIOR [place] ARTHROPLASTY C5-C7;  Surgeon: [name], MD;  Location: [facility] ORS;  Service: Neurosurgery;  Laterality: N/A;  COLONOSCOPY WITH PROPOFOL N/A 12/03/2016   Procedure: COLONOSCOPY WITH PROPOFOL;  Surgeon: [name], MD;  Location: [facility] ENDOSCOPY;  Service: Endoscopy;  Laterality: N/A;   ESOPHAGOGASTRODUODENOSCOPY (EGD) WITH PROPOFOL N/A 12/03/2016   Procedure: ESOPHAGOGASTRODUODENOSCOPY (EGD) WITH PROPOFOL;  Surgeon: [name], MD;  Location: [facility] ENDOSCOPY;  Service: Endoscopy;  Laterality: N/A;   FINGER SURGERY Right 2012   index finger   GASTRIC BYPASS     GIVENS CAPSULE STUDY N/A 01/07/2017   Procedure: GIVENS CAPSULE STUDY;  Surgeon: [name], MD;  Location: [facility]  ENDOSCOPY;  Service: Endoscopy;  Laterality: N/A;   LEFT HEART CATH AND CORONARY ANGIOGRAPHY N/A 12/04/2016   Procedure: Left Heart Cath and Coronary Angiography;  Surgeon: [name], MD;  Location: [place] [facility];  Service: Cardiovascular;  Laterality: N/A;   SHOULDER ARTHROSCOPY WITH OPEN ROTATOR CUFF REPAIR Left 10/04/2017   Procedure: SHOULDER ARTHROSCOPY WITH ROTATOR CUFF REPAIR, SUBACROMIAL DECOMPRESSION,OPEN BICEP TENODESIS, EXTENSIVE DEBRIDEMENT;  Surgeon: [name], MD;  Location: [facility] ORS;  Service: Orthopedics;  Laterality: Left;       Home Medications    Prior to Admission medications   Medication Sig Start Date End Date Taking? Authorizing Provider  calcium carbonate (OS-CAL) 600 MG TABS tablet Take 1,200 mg by mouth daily with breakfast.    Yes [provider]  carvedilol (COREG) 12.5 MG tablet TAKE 1 TABLET BY MOUTH TWICE DAILY 10/13/18  Yes Darylene Price A, FNP  digoxin (LANOXIN) 0.125 MG tablet Take 1 tablet (125 mcg total) by mouth daily. 09/29/19  Yes [name], MD  Insulin Glargine (BASAGLAR KWIKPEN) 100 UNIT/ML INJECT SUBCUTANEOUSLY 18   UNITS (0.18ML) DAILY 02/02/21  Yes Bedsole, Amy E, MD  metFORMIN (GLUCOPHAGE-XR) 500 MG 24 hr tablet TAKE 1 TABLET DAILY WITH   BREAKFAST 08/01/20  Yes Bedsole, Amy E, MD  Molnupiravir 200 MG CAPS Take 4 capsules (800 mg total) by mouth in the morning and at bedtime for 5 days. 03/08/21 03/13/21 Yes Debbrah Alar, NP  Multiple Vitamins-Minerals (ADULT GUMMY PO) Take 2 tablets by mouth daily.   Yes [provider]  Omega-3 Fatty Acids (FISH OIL PO) Take 800 mg by mouth daily.   Yes [provider]  omeprazole (PRILOSEC) 40 MG capsule TAKE 1 CAPSULE DAILY AS    NEEDED FOR INDIGESTION 08/01/20  Yes Bedsole, Amy E, MD  sacubitril-valsartan (ENTRESTO) 97-103 MG Take 1 tablet by mouth 2 (two) times daily. 01/05/19  Yes [name], MD  spironolactone (ALDACTONE) 25 MG tablet Take 1 tablet (25 mg  total) by mouth daily. 01/05/19  Yes [name], MD  glucose blood (ONE TOUCH ULTRA TEST) test strip Check blood sugar once daily and as instructed. Dx E11.9 04/16/19   [name], MD  Insulin Pen Needle (B-D ULTRAFINE III SHORT PEN) 31G X 8 MM MISC Use to inject insulin daily 07/26/20   [name], MD    Family History Family History  Problem Relation Age of Onset   Diabetes Mother    Hypertension Mother    Cancer Mother        breast   Diabetes Father    Hypertension Father    Cancer Father        colon   Cancer Maternal Grandfather        prostate   Stroke Maternal Grandfather        CVA    Social History Social History   Tobacco Use   Smoking status: Never  Smokeless tobacco: Never  Vaping Use   Vaping Use: Never used  Substance Use Topics   Alcohol use: Not Currently   Drug use: No     Allergies   Mobic [meloxicam], Tramadol, and Diclofenac   Review of Systems Review of Systems  Constitutional:  Negative for activity change and fever.  Gastrointestinal:  Negative for nausea and vomiting.  Genitourinary:  Positive for urgency. Negative for dysuria, frequency and hematuria.  Musculoskeletal:  Positive for back pain.  Skin:  Negative for rash.  Hematological: Negative.   Psychiatric/Behavioral: Negative.      Physical Exam Triage Vital Signs ED Triage Vitals  Enc Vitals Group     BP      Pulse      Resp      Temp      Temp src      SpO2      Weight      Height      Head Circumference      Peak Flow      Pain Score      Pain Loc      Pain Edu?      Excl. in [place]?    No data found.  Updated Vital Signs BP (!) 139/91 (BP Location: Left Arm)   Pulse 89   Temp 98 F (36.7 C) (Oral)   Resp 18   Ht 6' (1.829 m)   Wt 210 lb (95.3 kg)   SpO2 99%   BMI 28.48 kg/m   Visual Acuity Right Eye Distance:   Left Eye Distance:   Bilateral Distance:    Right Eye Near:   Left Eye Near:    Bilateral Near:     Physical Exam Vitals  and nursing note reviewed.  Constitutional:      General: He is not in acute distress.    Appearance: Normal appearance. He is not ill-appearing.  HENT:     Head: Normocephalic and atraumatic.  Cardiovascular:     Rate and Rhythm: Normal rate and regular rhythm.     Pulses: Normal pulses.     Heart sounds: Normal heart sounds. No murmur heard.   No gallop.  Pulmonary:     Effort: Pulmonary effort is normal.     Breath sounds: Normal breath sounds. No wheezing, rhonchi or rales.  Abdominal:     Tenderness: There is no right CVA tenderness or left CVA tenderness.  Skin:    General: Skin is warm and dry.     Capillary Refill: Capillary refill takes less than 2 seconds.     Findings: No erythema or rash.  Neurological:     General: No focal deficit present.     Mental Status: He is alert and oriented to person, place, and time.  Psychiatric:        Mood and Affect: Mood normal.        Behavior: Behavior normal.        Thought Content: Thought content normal.        Judgment: Judgment normal.     UC Treatments / Results  Labs (all labs ordered are listed, but only abnormal results are displayed) Labs Reviewed  POCT URINALYSIS DIP (DEVICE) - Abnormal; Notable for the following components:      Result Value   Glucose, UA 500 (*)    Hgb urine dipstick TRACE (*)    Protein, ur >=300 (*)    All other components within normal limits  URINE CULTURE  POCT URINALYSIS  DIPSTICK, ED / UC    EKG   Radiology No results found.  Procedures Procedures (including critical care time)  Medications Ordered in UC Medications - No data to display  Initial Impression / Assessment and Plan / UC Course  I have reviewed the triage vital signs and the nursing notes.  Pertinent labs & imaging results that were available during my care of the patient were reviewed by me and considered in my medical decision making (see chart for details).  Patient is a very pleasant, nontoxic-appearing  50 year old male here for evaluation of left-sided low back pain and dark urine as outlined in HPI above.  Patient physical exam reveals a benign cardiopulmonary exam.  Abdomen is soft and nontender.  No CVA tenderness on exam.  Urine collected at triage is golden yellow in color without cloudiness or overt blood.  Will check urine dipstick.  Urine dipstick shows 500 glucose, trace hemoglobin, greater than 300 protein but is leukocyte and nitrite negative.  Specific gravity >1.030  Will discharge patient home with a diagnosis of mild dehydration and will encourage patient to increase his oral fluid intake so as to dilute his urine out.  Patient to continue home medication regimen.   Final Clinical Impressions(s) / UC Diagnoses   Final diagnoses:  Abnormal urine findings     Discharge Instructions      Your urine today did not show any presence of infection but it does indicate that you are not drinking enough fluid as you have a large amount of protein and a high specific gravity.  Increase your oral fluid intake with a goal of 64 to 128 ounces of water daily.  Continue your molnupiravir and other home medications as previously prescribed.  Return for reevaluation for any new or worsening symptoms, or follow-up with your primary care provider.     ED Prescriptions   None    PDMP not reviewed this encounter.   Margarette Canada, NP 03/10/21 1756

## 2021-03-10 NOTE — ED Triage Notes (Signed)
Pt c/o headache, fever (102), cough and some body aches. Pt states symptoms started Tuesday and he took a home test Wednesday and it was positive. Pt also reports some left sided low back pain and dark urine for several days. Pt denies dysuria or other symptoms.

## 2021-03-10 NOTE — Telephone Encounter (Signed)
[facility] [place]

## 2021-03-10 NOTE — Telephone Encounter (Signed)
See note below the access nurse note please.

## 2021-03-10 NOTE — Telephone Encounter (Signed)
Philip Ponce called in and stated that he is going to [facility]

## 2021-03-10 NOTE — Telephone Encounter (Signed)
Pts wife (DPR signed) said pt had video visit & + covid on 03/08/21 pt was given molnupirivir 800 mg bid for 5 days. Pt started med on 03/08/21; pt has taken 5 doses of his molnupirivir so far.Starting today pt has very dark urine and lower rt back pain; pain is a dull nagging continuous back pain at pain level 4. No burning or pain upon urination and no frequency of urine but pt does have feeling of urgency.No abd pain. Pt has been drinking a lot of green tea and water. Pt is not having diarrhea or nausea or dizziness. Pt is unsteady on his feet when he first gets up but is not dizzy. No fever since 03/09/21. Pts wife was at work and could only hold for a short time; Dr [name] was in room seeing another pt and Butch Penny CMA said pt would need urine ck and pt can go to UC for eval. When I went back pts wife had hung up and I called pt. Pt said he had gotten off phone with another nurse and was advised needed to go to ED. Pt said his wife is on her way home to take pt to [facility] ED to get eval and urine cked. I voiced understanding and pt was appreciative of call but he is going to [facility] ED. FYI to Dr [name].

## 2021-03-10 NOTE — Telephone Encounter (Signed)
[facility] - Client TELEPHONE ADVICE RECORD AccessNurse Patient Name: Philip Ponce Gender: Male DOB: 1970-10-06 Age: 50 Y 9 M 22 D Return Phone Number: [phone] (Primary), [phone] (Secondary) Address: City/ State/ Zip: [place] [address] Client [place] [facility] - Client Client Site [facility] - Day Physician Eliezer Lofts - MD Contact Type Call Who Is Calling Patient / Member / Family / Caregiver Call Type Triage / Clinical Caller Name Philip Ponce Relationship To Patient Spouse Return Phone Number [phone] (Primary) Chief Complaint Flank Pain Reason for Call Symptomatic / Request for [place] states husband is Covid positive, is on medication but now urine is dark colored, dizziness, off balance, nasal congestion and right flank pain [place] Not Listed [facility] Translation No Nurse Assessment Nurse: Alveta Heimlich, RN, Rise Paganini Date/Time (Eastern Time): 03/10/2021 2:45:07 PM Confirm and document reason for call. If symptomatic, describe symptoms. ---Caller states he has covid and is now having dizziness, dark colored urine and flank pain. Fever has dropped from 103 to 98.7 oral. This started a couple of days ago. He was diagnosed with covid on Wednesday. He is taking covid pills. Does the patient have any new or worsening symptoms? ---Yes Will a triage be completed? ---Yes Related visit to physician within the last 2 weeks? ---No Does the PT have any chronic conditions? (i.e. diabetes, asthma, this includes High risk factors for pregnancy, etc.) ---Yes List chronic conditions. ---pacemaker, diabetes Is this a behavioral health or substance abuse call? ---No Guidelines Guideline Title Affirmed Question Affirmed Notes Nurse Date/Time (Eastern Time) Flank Pain MODERATE pain (e.g., interferes with normal activities or awakens  from sleep) Alveta Heimlich, RN, Rise Paganini 03/10/2021 2:48:17 PM PLEASE NOTE: All timestamps contained within this report are represented as [place] Standard Time. CONFIDENTIALTY NOTICE: This fax transmission is intended only for the addressee. It contains information that is legally privileged, confidential or otherwise protected from use or disclosure. If you are not the intended recipient, you are strictly prohibited from reviewing, disclosing, copying using or disseminating any of this information or taking any action in reliance on or regarding this information. If you have received this fax in error, please notify us immediately by telephone so that we can arrange for its return to [place]. Phone: [phone], Toll-Free: [phone], Fax: [phone] Page: 2 of 2 Call Id: [id number] Guidelines Guideline Title Affirmed Question Affirmed Notes Nurse Date/Time Eilene Ghazi Time) Dizziness - Lightheadedness Patient sounds very sick or weak to the triager Alveta Heimlich, [place], [facility] 03/10/2021 2:53:43 PM Disp. Time Eilene Ghazi Time) Disposition Final User 03/10/2021 2:50:54 PM See HCP within 4 Hours (or PCP triage) Alveta Heimlich, RN, Rise Paganini 03/10/2021 2:55:45 PM Go to ED Now (or PCP triage) Yes Alveta Heimlich, RN, Rise Paganini Disposition Overriden: See PCP within 24 Hours Override Reason: Patient's symptoms need a higher level of care Caller Disagree/Comply Comply Caller Understands Yes PreDisposition Call Doctor Care Advice Given Per Guideline SEE HCP (OR PCP TRIAGE) WITHIN 4 HOURS: CARE ADVICE given per Flank Pain (Adult) guideline. * You become worse CALL BACK IF: * Drink extra fluids. GO TO ED NOW (OR PCP TRIAGE): CARE ADVICE given per Dizziness (Adult) guideline. Comments User: Debby Bud, RN Date/Time Eilene Ghazi Time): 03/10/2021 2:56:56 PM No appointments at the office Referrals [place]

## 2021-03-12 LAB — URINE CULTURE
Culture: NO GROWTH
Special Requests: NORMAL

## 2021-03-31 ENCOUNTER — Ambulatory Visit (INDEPENDENT_AMBULATORY_CARE_PROVIDER_SITE_OTHER): Payer: Medicare HMO

## 2021-03-31 DIAGNOSIS — I428 Other cardiomyopathies: Secondary | ICD-10-CM

## 2021-04-02 LAB — CUP PACEART REMOTE DEVICE CHECK
Battery Remaining Longevity: 34 mo
Battery Voltage: 2.95 V
Brady Statistic AP VP Percent: 0.05 %
Brady Statistic AP VS Percent: 0 %
Brady Statistic AS VP Percent: 98.74 %
Brady Statistic AS VS Percent: 1.21 %
Brady Statistic RA Percent Paced: 0.06 %
Brady Statistic RV Percent Paced: 91.42 %
Date Time Interrogation Session: 20220708022603
HighPow Impedance: 54 Ohm
Implantable Lead Implant Date: 20181003
Implantable Lead Implant Date: 20181003
Implantable Lead Implant Date: 20181003
Implantable Lead Location: 753858
Implantable Lead Location: 753859
Implantable Lead Location: 753860
Implantable Lead Model: 4398
Implantable Lead Model: 5076
Implantable Pulse Generator Implant Date: 20181003
Lead Channel Impedance Value: 166.25 Ohm
Lead Channel Impedance Value: 166.25 Ohm
Lead Channel Impedance Value: 169.459
Lead Channel Impedance Value: 199.5 Ohm
Lead Channel Impedance Value: 204.14 Ohm
Lead Channel Impedance Value: 285 Ohm
Lead Channel Impedance Value: 285 Ohm
Lead Channel Impedance Value: 342 Ohm
Lead Channel Impedance Value: 399 Ohm
Lead Channel Impedance Value: 399 Ohm
Lead Channel Impedance Value: 399 Ohm
Lead Channel Impedance Value: 418 Ohm
Lead Channel Impedance Value: 513 Ohm
Lead Channel Impedance Value: 608 Ohm
Lead Channel Impedance Value: 608 Ohm
Lead Channel Impedance Value: 646 Ohm
Lead Channel Impedance Value: 665 Ohm
Lead Channel Impedance Value: 665 Ohm
Lead Channel Pacing Threshold Amplitude: 0.625 V
Lead Channel Pacing Threshold Amplitude: 0.75 V
Lead Channel Pacing Threshold Amplitude: 1.25 V
Lead Channel Pacing Threshold Pulse Width: 0.4 ms
Lead Channel Pacing Threshold Pulse Width: 0.4 ms
Lead Channel Pacing Threshold Pulse Width: 0.4 ms
Lead Channel Sensing Intrinsic Amplitude: 2.125 mV
Lead Channel Sensing Intrinsic Amplitude: 2.125 mV
Lead Channel Sensing Intrinsic Amplitude: 8 mV
Lead Channel Sensing Intrinsic Amplitude: 8 mV
Lead Channel Setting Pacing Amplitude: 1.5 V
Lead Channel Setting Pacing Amplitude: 2 V
Lead Channel Setting Pacing Amplitude: 2.5 V
Lead Channel Setting Pacing Pulse Width: 0.4 ms
Lead Channel Setting Pacing Pulse Width: 0.4 ms
Lead Channel Setting Sensing Sensitivity: 0.3 mV

## 2021-04-11 ENCOUNTER — Telehealth: Payer: Self-pay | Admitting: Family Medicine

## 2021-04-11 NOTE — Telephone Encounter (Signed)
Philip Ponce called in and wanted to know if Dr. [name] would write a prescription for ED due to the medication he is on it may be the cause of it And his heart doctor told him to reach his PCP due to the medication for his diabetes.   The pharmacy- [facility] [address]

## 2021-04-13 NOTE — Telephone Encounter (Signed)
Call   Heart meds may be causing ED.. has he tried any ED med in past? What is on his formulary?   DM meds not causing ED... does he have a question about DM meds?

## 2021-04-13 NOTE — Telephone Encounter (Signed)
Spoke with Mrs. Cartelli.  He has not taken any ED medications in the past.  She will check with his insurance to see which ED medication is covered.  I did advise that if his insurance does not cover ED medication then we could get a coupon off [employer] and they can pay out of pocket.  She will call back once she speaks to [facility].  There are no questions about DM medications.  FYI to Dr. [name].

## 2021-04-21 NOTE — Progress Notes (Signed)
Remote ICD transmission.   

## 2021-05-01 LAB — HM DIABETES FOOT EXAM

## 2021-05-05 ENCOUNTER — Other Ambulatory Visit: Payer: Self-pay

## 2021-05-05 ENCOUNTER — Ambulatory Visit (INDEPENDENT_AMBULATORY_CARE_PROVIDER_SITE_OTHER): Payer: Medicare HMO | Admitting: Family Medicine

## 2021-05-05 VITALS — BP 128/82 | HR 73 | Temp 97.5°F | Ht 71.5 in | Wt 208.0 lb

## 2021-05-05 DIAGNOSIS — E1159 Type 2 diabetes mellitus with other circulatory complications: Secondary | ICD-10-CM | POA: Diagnosis not present

## 2021-05-05 DIAGNOSIS — E119 Type 2 diabetes mellitus without complications: Secondary | ICD-10-CM | POA: Diagnosis not present

## 2021-05-05 DIAGNOSIS — I152 Hypertension secondary to endocrine disorders: Secondary | ICD-10-CM | POA: Diagnosis not present

## 2021-05-05 DIAGNOSIS — Z794 Long term (current) use of insulin: Secondary | ICD-10-CM | POA: Diagnosis not present

## 2021-05-05 LAB — POCT GLYCOSYLATED HEMOGLOBIN (HGB A1C): Hemoglobin A1C: 8.1 % — AB (ref 4.0–5.6)

## 2021-05-05 MED ORDER — OMEPRAZOLE 40 MG PO CPDR: DELAYED_RELEASE_CAPSULE | ORAL | 3 refills | Status: DC

## 2021-05-05 MED ORDER — BD PEN NEEDLE SHORT U/F 31G X 8 MM MISC: 3 refills | Status: DC

## 2021-05-05 MED ORDER — METFORMIN HCL ER 500 MG PO TB24: 500.0000 mg | ORAL_TABLET | Freq: Every day | ORAL | 3 refills | Status: DC

## 2021-05-05 NOTE — Assessment & Plan Note (Addendum)
Stable, chronic.  Continue current medication.   Entresto 97/103 mg daily   aldactone 25 mg daily dogoxin 0.125 mg daily  coreg 12.5 mg BID 

## 2021-05-05 NOTE — Patient Instructions (Addendum)
Check blood sugar fasting daily.Marland Kitchen goal < 120.  Keep working on low carb diet and increase activity. Call  or send message via MyChart with measurements of blood sugar  in 2 weeks and we will decide if we need to adjust the medicine. For now continue your current regimen.

## 2021-05-05 NOTE — Assessment & Plan Note (Signed)
Per pt he is surprised A1C has worsened as n last months he has been ding very well with increased activity and back on track with diet.  he will call with CBGs in 2 weeks.. if at goal will no change meds.  if above goal will increase metformin or insulin.  Scheduled for eye exam.

## 2021-05-05 NOTE — Progress Notes (Signed)
Patient ID: Philip Ponce, male    DOB: 1971/03/28, 50 y.o.   MRN: [id number]  This visit was conducted in person.  BP 128/82   Pulse 73   Temp (!) 97.5 F (36.4 C) (Temporal)   Ht 5' 11.5" (1.816 m)   Wt 208 lb (94.3 kg)   SpO2 100%   BMI 28.61 kg/m    CC:  Chief Complaint  Patient presents with   Follow-up    3 mo- DM  Eye exam scheduled for 05/08/21    Subjective:   HPI: Philip Ponce is a 50 y.o. male presenting on 05/05/2021 for Follow-up (3 mo- DM /Eye exam scheduled for 05/08/21)   Diabetes:   Inadequate control worsened from 6.4 10/21, 7.5 01/2021.  Metfomrin 500 mg daily XR  Basalgar pen 18 units daily Lab Results  Component Value Date   HGBA1C 8.1 (A) 05/05/2021  Using medications without difficulties: Hypoglycemic episodes: Hyperglycemic episodes: Feet problems: non- ulcer Blood Sugars averaging:? eye exam within last year:  no scheduled 05/08/2021 not checking blood sugars lately.  Has been eating fairly well in last month eating very well ( was binging more early in last 3 month)... 4lb loss in last  4 month.   He has increase activity lately with  helping friend.  Also going to gym daily.  Wt Readings from Last 3 Encounters:  05/05/21 208 lb (94.3 kg)  03/10/21 210 lb (95.3 kg)  02/13/21 212 lb (96.2 kg)        Blood pressure well controlled on current regimen BP Readings from Last 3 Encounters:  05/05/21 128/82  03/10/21 (!) 139/91  02/13/21 (!) 144/86    Relevant past medical, surgical, family and social history reviewed and updated as indicated. Interim medical history since our last visit reviewed. Allergies and medications reviewed and updated. Outpatient Medications Prior to Visit  Medication Sig Dispense Refill   calcium carbonate (OS-CAL) 600 MG TABS tablet Take 1,200 mg by mouth daily with breakfast.      carvedilol (COREG) 12.5 MG tablet TAKE 1 TABLET BY MOUTH TWICE DAILY 180 tablet 3   digoxin (LANOXIN) 0.125 MG tablet  Take 1 tablet (125 mcg total) by mouth daily. 30 tablet 3   glucose blood (ONE TOUCH ULTRA TEST) test strip Check blood sugar once daily and as instructed. Dx E11.9 100 each 1   Insulin Glargine (BASAGLAR KWIKPEN) 100 UNIT/ML INJECT SUBCUTANEOUSLY 18   UNITS (0.18ML) DAILY 15 mL 1   Insulin Pen Needle (B-D ULTRAFINE III SHORT PEN) 31G X 8 MM MISC Use to inject insulin daily 90 each 3   metFORMIN (GLUCOPHAGE-XR) 500 MG 24 hr tablet TAKE 1 TABLET DAILY WITH   BREAKFAST 90 tablet 3   Multiple Vitamins-Minerals (ADULT GUMMY PO) Take 2 tablets by mouth daily.     Omega-3 Fatty Acids (FISH OIL PO) Take 800 mg by mouth daily.     omeprazole (PRILOSEC) 40 MG capsule TAKE 1 CAPSULE DAILY AS    NEEDED FOR INDIGESTION 90 capsule 3   sacubitril-valsartan (ENTRESTO) 97-103 MG Take 1 tablet by mouth 2 (two) times daily. 180 tablet 3   spironolactone (ALDACTONE) 25 MG tablet Take 1 tablet (25 mg total) by mouth daily. 90 tablet 3   No facility-administered medications prior to visit.     Per HPI unless specifically indicated in ROS section below Review of Systems  Constitutional:  Negative for fatigue and fever.  HENT:  Negative for ear pain.   Eyes:  Negative for pain.  Respiratory:  Negative for cough and shortness of breath.   Cardiovascular:  Negative for chest pain, palpitations and leg swelling.  Gastrointestinal:  Negative for abdominal pain.  Genitourinary:  Negative for dysuria.  Musculoskeletal:  Negative for arthralgias.  Neurological:  Negative for syncope, light-headedness and headaches.  Psychiatric/Behavioral:  Negative for dysphoric mood.   Objective:  BP 128/82   Pulse 73   Temp (!) 97.5 F (36.4 C) (Temporal)   Ht 5' 11.5" (1.816 m)   Wt 208 lb (94.3 kg)   SpO2 100%   BMI 28.61 kg/m   Wt Readings from Last 3 Encounters:  05/05/21 208 lb (94.3 kg)  03/10/21 210 lb (95.3 kg)  02/13/21 212 lb (96.2 kg)      Physical Exam Constitutional:      Appearance: He is  well-developed.  HENT:     Head: Normocephalic.     Right Ear: Hearing normal.     Left Ear: Hearing normal.     Nose: Nose normal.  Neck:     Thyroid: No thyroid mass or thyromegaly.     Vascular: No carotid bruit.     Trachea: Trachea normal.  Cardiovascular:     Rate and Rhythm: Normal rate and regular rhythm.     Pulses: Normal pulses.     Heart sounds: Heart sounds not distant. Murmur heard.  Systolic murmur is present.    No friction rub. No gallop.     Comments: No peripheral edema Pulmonary:     Effort: Pulmonary effort is normal. No respiratory distress.     Breath sounds: Normal breath sounds.  Musculoskeletal:     Right lower leg: No edema.     Left lower leg: No edema.  Skin:    General: Skin is warm and dry.     Findings: No rash.  Psychiatric:        Speech: Speech normal.        Behavior: Behavior normal.        Thought Content: Thought content normal.   Diabetic foot exam: Normal inspection No skin breakdown No calluses  Normal DP pulses Normal sensation to light touch and monofilament Nails normal     Results for orders placed or performed in visit on 05/05/21  POCT glycosylated hemoglobin (Hb A1C)  Result Value Ref Range   Hemoglobin A1C 8.1 (A) 4.0 - 5.6 %   HbA1c POC (<> result, manual entry)     HbA1c, POC (prediabetic range)     HbA1c, POC (controlled diabetic range)      This visit occurred during the SARS-CoV-2 public health emergency.  Safety protocols were in place, including screening questions prior to the visit, additional usage of staff PPE, and extensive cleaning of exam room while observing appropriate contact time as indicated for disinfecting solutions.   COVID 19 screen:  No recent travel or known exposure to COVID19 The patient denies respiratory symptoms of COVID 19 at this time. The importance of social distancing was discussed today.   Assessment and Plan    Problem List Items Addressed This Visit     Hypertension  associated with diabetes ([place]) (Chronic)    Stable, chronic.  Continue current medication.   Entresto 97/103 mg daily   aldactone 25 mg daily dogoxin 0.125 mg daily  coreg 12.5 mg BID      Relevant Medications   metFORMIN (GLUCOPHAGE-XR) 500 MG 24 hr tablet   Type 2 diabetes mellitus without complication, with long-term current  use of insulin ([facility]) - Primary (Chronic)    Per pt he is surprised A1C has worsened as n last months he has been ding very well with increased activity and back on track with diet.  he will call with CBGs in 2 weeks.. if at goal will no change meds.  if above goal will increase metformin or insulin.  Scheduled for eye exam.      Relevant Medications   metFORMIN (GLUCOPHAGE-XR) 500 MG 24 hr tablet   Insulin Pen Needle (B-D ULTRAFINE III SHORT PEN) 31G X 8 MM MISC   Other Relevant Orders   POCT glycosylated hemoglobin (Hb A1C) (Completed)   Meds ordered this encounter  Medications   metFORMIN (GLUCOPHAGE-XR) 500 MG 24 hr tablet    Sig: Take 1 tablet (500 mg total) by mouth daily with breakfast.    Dispense:  90 tablet    Refill:  3   omeprazole (PRILOSEC) 40 MG capsule    Sig: TAKE 1 CAPSULE DAILY AS    NEEDED FOR INDIGESTION    Dispense:  90 capsule    Refill:  3   Insulin Pen Needle (B-D ULTRAFINE III SHORT PEN) 31G X 8 MM MISC    Sig: Use to inject insulin daily    Dispense:  90 each    Refill:  3     Eliezer Lofts, MD

## 2021-05-08 ENCOUNTER — Telehealth: Payer: Self-pay | Admitting: Family Medicine

## 2021-05-08 DIAGNOSIS — Z794 Long term (current) use of insulin: Secondary | ICD-10-CM

## 2021-05-08 DIAGNOSIS — E119 Type 2 diabetes mellitus without complications: Secondary | ICD-10-CM

## 2021-05-08 LAB — HM DIABETES EYE EXAM

## 2021-05-08 MED ORDER — GLUCOSE BLOOD VI STRP: ORAL_STRIP | 5 refills | Status: AC

## 2021-05-08 MED ORDER — ONETOUCH ULTRASOFT LANCETS MISC
5 refills | Status: AC
Start: 2021-05-08 — End: ?

## 2021-05-08 MED ORDER — ONETOUCH ULTRA 2 W/DEVICE KIT: PACK | 0 refills | Status: AC

## 2021-05-08 NOTE — Telephone Encounter (Signed)
Diabetic testing supplies sent to [facility] as requested.  Will forward message to Dr. [name] to address neuropathy issue.

## 2021-05-08 NOTE — Telephone Encounter (Signed)
Mrs. Rehl called in and stated that Dr. [name] wanted to him to start checking his blood sugar but he needs the supplies to check his blood sugar. And his wife stated that she thinks he has diabetic neuropathy in his left leg he is complaining about numbness in his left foot. Wanted to know what to do.   The pharmacy he uses [facility]- [address]

## 2021-05-08 NOTE — Telephone Encounter (Signed)
Call   Diabetic neuropathy  is typically Bilaterl soles of feet ( numbness, or burning pain).  If just left leg I would need to eval to make sure not coming from back or other nerve compression. He can make an appt. If other things ruled out and it is neuropathy... improving DM control is  the main treatmen, but if painful there are neuropathic pain meds. No treatment otherwise for just numbness.

## 2021-05-09 NOTE — Telephone Encounter (Signed)
Lattie Haw notified as instructed by telephone.  She states she spoke to someone this morning and they told her that Dr. [name] recommended that Johnta be evaluated.  Appointment was scheduled on 05/12/2021 at 12:00 pm.

## 2021-05-12 ENCOUNTER — Other Ambulatory Visit: Payer: Self-pay

## 2021-05-12 ENCOUNTER — Ambulatory Visit (INDEPENDENT_AMBULATORY_CARE_PROVIDER_SITE_OTHER): Payer: Medicare HMO | Admitting: Family Medicine

## 2021-05-12 VITALS — BP 124/86 | HR 87 | Temp 98.3°F | Ht 71.5 in | Wt 207.5 lb

## 2021-05-12 DIAGNOSIS — E119 Type 2 diabetes mellitus without complications: Secondary | ICD-10-CM | POA: Diagnosis not present

## 2021-05-12 DIAGNOSIS — R208 Other disturbances of skin sensation: Secondary | ICD-10-CM | POA: Diagnosis not present

## 2021-05-12 DIAGNOSIS — M79672 Pain in left foot: Secondary | ICD-10-CM | POA: Insufficient documentation

## 2021-05-12 DIAGNOSIS — R200 Anesthesia of skin: Secondary | ICD-10-CM

## 2021-05-12 DIAGNOSIS — Z794 Long term (current) use of insulin: Secondary | ICD-10-CM

## 2021-05-12 MED ORDER — GABAPENTIN 100 MG PO CAPS: 100.0000 mg | ORAL_CAPSULE | Freq: Every day | ORAL | 1 refills | Status: DC

## 2021-05-12 NOTE — Progress Notes (Signed)
Patient ID: Philip Ponce, male    DOB: 10/19/1970, 50 y.o.   MRN: [id number]  This visit was conducted in person.  BP 124/86   Pulse 87   Temp 98.3 F (36.8 C) (Temporal)   Ht 5' 11.5" (1.816 m)   Wt 207 lb 8 oz (94.1 kg)   SpO2 95%   BMI 28.54 kg/m    CC: Chief Complaint  Patient presents with   Foot Pain    Left-Top of foot    Subjective:   HPI: Philip Ponce is a 50 y.o. male presenting on 05/12/2021 for Foot Pain (Left-Top of foot)  He  has been noting tingling in left foot, achy pain. Onging x   Trouble sleeping at night, worse when lying down at night. No current low back pain, no radiation of pain  from leg.  No weakness.   Has history of sciatic nerve  pain. S/P procedure, ESI years ago, possible nerve ablation.  Has improved some with walking.  DM .Marland Kitchen poor control at last OV  Lab Results  Component Value Date   HGBA1C 8.1 (A) 05/05/2021  He has been working on lowering carbs.  FBS 79-121 on current regimen. He has stopped ETOH use now.  Per wife private conversation with CMA... she is concerned he may be drinking too much alcohol     Relevant past medical, surgical, family and social history reviewed and updated as indicated. Interim medical history since our last visit reviewed. Allergies and medications reviewed and updated. Outpatient Medications Prior to Visit  Medication Sig Dispense Refill   Blood Glucose Monitoring Suppl (ONE TOUCH ULTRA 2) w/Device KIT Use to check blood sugar up to 2 times a day 1 kit 0   calcium carbonate (OS-CAL) 600 MG TABS tablet Take 1,200 mg by mouth daily with breakfast.      carvedilol (COREG) 12.5 MG tablet TAKE 1 TABLET BY MOUTH TWICE DAILY 180 tablet 3   digoxin (LANOXIN) 0.125 MG tablet Take 1 tablet (125 mcg total) by mouth daily. 30 tablet 3   glucose blood (ONE TOUCH ULTRA TEST) test strip Check blood sugar up to 2 times a day and as instructed. Dx E11.9 100 each 5   Insulin Glargine (BASAGLAR KWIKPEN)  100 UNIT/ML INJECT SUBCUTANEOUSLY 18   UNITS (0.18ML) DAILY 15 mL 1   Insulin Pen Needle (B-D ULTRAFINE III SHORT PEN) 31G X 8 MM MISC Use to inject insulin daily 90 each 3   Lancets (ONETOUCH ULTRASOFT) lancets Use to check blood sugar up to 2 times a day. 100 each 5   metFORMIN (GLUCOPHAGE-XR) 500 MG 24 hr tablet Take 1 tablet (500 mg total) by mouth daily with breakfast. 90 tablet 3   Multiple Vitamins-Minerals (ADULT GUMMY PO) Take 2 tablets by mouth daily.     Omega-3 Fatty Acids (FISH OIL PO) Take 800 mg by mouth daily.     omeprazole (PRILOSEC) 40 MG capsule TAKE 1 CAPSULE DAILY AS    NEEDED FOR INDIGESTION 90 capsule 3   sacubitril-valsartan (ENTRESTO) 97-103 MG Take 1 tablet by mouth 2 (two) times daily. 180 tablet 3   spironolactone (ALDACTONE) 25 MG tablet Take 1 tablet (25 mg total) by mouth daily. 90 tablet 3   No facility-administered medications prior to visit.     Per HPI unless specifically indicated in ROS section below Review of Systems  Constitutional:  Negative for fatigue and fever.  HENT:  Negative for ear pain.   Eyes:  Negative for pain.  Respiratory:  Negative for cough and shortness of breath.   Cardiovascular:  Negative for chest pain, palpitations and leg swelling.  Gastrointestinal:  Negative for abdominal pain.  Genitourinary:  Negative for dysuria.  Musculoskeletal:  Negative for arthralgias.  Neurological:  Negative for syncope, light-headedness and headaches.  Psychiatric/Behavioral:  Negative for dysphoric mood.   Objective:  BP 124/86   Pulse 87   Temp 98.3 F (36.8 C) (Temporal)   Ht 5' 11.5" (1.816 m)   Wt 207 lb 8 oz (94.1 kg)   SpO2 95%   BMI 28.54 kg/m   Wt Readings from Last 3 Encounters:  05/12/21 207 lb 8 oz (94.1 kg)  05/05/21 208 lb (94.3 kg)  03/10/21 210 lb (95.3 kg)      Physical Exam Constitutional:      Appearance: He is well-developed.  HENT:     Head: Normocephalic.     Right Ear: Hearing normal.     Left Ear: Hearing  normal.     Nose: Nose normal.  Neck:     Thyroid: No thyroid mass or thyromegaly.     Vascular: No carotid bruit.     Trachea: Trachea normal.  Cardiovascular:     Rate and Rhythm: Normal rate and regular rhythm.     Pulses: Normal pulses.     Heart sounds: Heart sounds not distant. No murmur heard.   No friction rub. No gallop.     Comments: No peripheral edema Pulmonary:     Effort: Pulmonary effort is normal. No respiratory distress.     Breath sounds: Normal breath sounds.  Skin:    General: Skin is warm and dry.     Findings: No rash.  Psychiatric:        Speech: Speech normal.        Behavior: Behavior normal.        Thought Content: Thought content normal.      Results for orders placed or performed in visit on 05/05/21  POCT glycosylated hemoglobin (Hb A1C)  Result Value Ref Range   Hemoglobin A1C 8.1 (A) 4.0 - 5.6 %   HbA1c POC (<> result, manual entry)     HbA1c, POC (prediabetic range)     HbA1c, POC (controlled diabetic range)    HM DIABETES FOOT EXAM  Result Value Ref Range   HM Diabetic Foot Exam done     This visit occurred during the SARS-CoV-2 public health emergency.  Safety protocols were in place, including screening questions prior to the visit, additional usage of staff PPE, and extensive cleaning of exam room while observing appropriate contact time as indicated for disinfecting solutions.   COVID 19 screen:  No recent travel or known exposure to COVID19 The patient denies respiratory symptoms of COVID 19 at this time. The importance of social distancing was discussed today.   Assessment and Plan    Problem List Items Addressed This Visit     Type 2 diabetes mellitus without complication, with long-term current use of insulin ([place]) - Primary (Chronic)    Poor control, chornic   Get back on track with ehalthy eating, low carb diet.  Reviewed high level of carbs in alcohol as well as other issues with alcohol use.  He states he has stopped  ETOh altogether now.      Left foot pain    Acute  Start gabapentin 100 mg at bedtime for likely diabetic neuropathy vs sciatica pain.      Numbness  of left foot     Eliezer Lofts, MD

## 2021-05-12 NOTE — Patient Instructions (Addendum)
Stay away from alcohol.  Continue working on low carb diet.  Start gabapentin 100 mg at bedtime for likely diabetic neuropathy vs sciatica pain.  Can hold melatonin.  If not improving  will consider checking B12 and Thyroid.

## 2021-06-13 DIAGNOSIS — I5042 Chronic combined systolic (congestive) and diastolic (congestive) heart failure: Secondary | ICD-10-CM | POA: Diagnosis not present

## 2021-06-13 DIAGNOSIS — Z0100 Encounter for examination of eyes and vision without abnormal findings: Secondary | ICD-10-CM | POA: Diagnosis not present

## 2021-06-21 NOTE — Assessment & Plan Note (Signed)
Acute  Start gabapentin 100 mg at bedtime for likely diabetic neuropathy vs sciatica pain.

## 2021-06-21 NOTE — Assessment & Plan Note (Addendum)
Poor control, chornic   Get back on track with ehalthy eating, low carb diet.  Reviewed high level of carbs in alcohol as well as other issues with alcohol use.  He states he has stopped ETOh altogether now.

## 2021-06-30 ENCOUNTER — Ambulatory Visit (INDEPENDENT_AMBULATORY_CARE_PROVIDER_SITE_OTHER): Payer: Medicare HMO

## 2021-06-30 DIAGNOSIS — I428 Other cardiomyopathies: Secondary | ICD-10-CM | POA: Diagnosis not present

## 2021-07-02 LAB — CUP PACEART REMOTE DEVICE CHECK
Battery Remaining Longevity: 29 mo
Battery Voltage: 2.95 V
Brady Statistic AP VP Percent: 0.01 %
Brady Statistic AP VS Percent: 0 %
Brady Statistic AS VP Percent: 98.78 %
Brady Statistic AS VS Percent: 1.21 %
Brady Statistic RA Percent Paced: 0.01 %
Brady Statistic RV Percent Paced: 89.6 %
Date Time Interrogation Session: 20221007033424
HighPow Impedance: 49 Ohm
Implantable Lead Implant Date: 20181003
Implantable Lead Implant Date: 20181003
Implantable Lead Implant Date: 20181003
Implantable Lead Location: 753858
Implantable Lead Location: 753859
Implantable Lead Location: 753860
Implantable Lead Model: 4398
Implantable Lead Model: 5076
Implantable Pulse Generator Implant Date: 20181003
Lead Channel Impedance Value: 155.455
Lead Channel Impedance Value: 159.265
Lead Channel Impedance Value: 159.265
Lead Channel Impedance Value: 175.622
Lead Channel Impedance Value: 180.5 Ohm
Lead Channel Impedance Value: 247 Ohm
Lead Channel Impedance Value: 285 Ohm
Lead Channel Impedance Value: 304 Ohm
Lead Channel Impedance Value: 342 Ohm
Lead Channel Impedance Value: 361 Ohm
Lead Channel Impedance Value: 361 Ohm
Lead Channel Impedance Value: 399 Ohm
Lead Channel Impedance Value: 456 Ohm
Lead Channel Impedance Value: 532 Ohm
Lead Channel Impedance Value: 551 Ohm
Lead Channel Impedance Value: 551 Ohm
Lead Channel Impedance Value: 608 Ohm
Lead Channel Impedance Value: 608 Ohm
Lead Channel Pacing Threshold Amplitude: 0.625 V
Lead Channel Pacing Threshold Amplitude: 0.875 V
Lead Channel Pacing Threshold Amplitude: 1.25 V
Lead Channel Pacing Threshold Pulse Width: 0.4 ms
Lead Channel Pacing Threshold Pulse Width: 0.4 ms
Lead Channel Pacing Threshold Pulse Width: 0.4 ms
Lead Channel Sensing Intrinsic Amplitude: 2.125 mV
Lead Channel Sensing Intrinsic Amplitude: 2.125 mV
Lead Channel Sensing Intrinsic Amplitude: 6.875 mV
Lead Channel Sensing Intrinsic Amplitude: 6.875 mV
Lead Channel Setting Pacing Amplitude: 1.5 V
Lead Channel Setting Pacing Amplitude: 2 V
Lead Channel Setting Pacing Amplitude: 2.5 V
Lead Channel Setting Pacing Pulse Width: 0.4 ms
Lead Channel Setting Pacing Pulse Width: 0.4 ms
Lead Channel Setting Sensing Sensitivity: 0.3 mV

## 2021-07-10 NOTE — Progress Notes (Signed)
Remote ICD transmission.   

## 2021-07-19 NOTE — Progress Notes (Signed)
Subjective:   Philip Ponce is a 50 y.o. male who presents for Medicare Annual/Subsequent preventive examination.  I connected with Philip Ponce today by telephone and verified that I am speaking with the correct person using two identifiers. Location patient: home Location provider: work Persons participating in the virtual visit: patient, Marine scientist.    I discussed the limitations, risks, security and privacy concerns of performing an evaluation and management service by telephone and the availability of in person appointments. I also discussed with the patient that there may be a patient responsible charge related to this service. The patient expressed understanding and verbally consented to this telephonic visit.    Interactive audio and video telecommunications were attempted between this provider and patient, however failed, due to patient having technical difficulties OR patient did not have access to video capability.  We continued and completed visit with audio only.  Some vital signs may be absent or patient reported.   Time Spent with patient on telephone encounter: 25 minutes   Review of Systems     Cardiac Risk Factors include: advanced age (>54men, >69 women);diabetes mellitus;dyslipidemia;hypertension     Objective:    Today's Vitals   07/20/21 1201  Weight: 207 lb (93.9 kg)  Height: $Remove'5\' 11"'[id number]$  (1.803 m)   Body mass index is 28.87 kg/m.  Advanced Directives 07/20/2021 12/22/2020 07/19/2020 12/21/2019 04/21/2019 08/14/2018 06/28/2018  Does Patient Have a Medical Advance Directive? Yes Yes Yes Yes Yes No Yes  Type of Paramedic of [place];Living will [place];Living will [place];Living will Living will Living will - Living will  Does patient want to make changes to medical advance directive? Yes (MAU/Ambulatory/Procedural Areas - Information given) - - No - Patient declined - No - Patient declined No -  Patient declined  Copy of [place] in Chart? Yes - validated most recent copy scanned in chart (See row information) No - copy requested Yes - validated most recent copy scanned in chart (See row information) - - - -  Would patient like information on creating a medical advance directive? - No - Patient declined - No - Patient declined - No - Patient declined -    Current Medications (verified) Outpatient Encounter Medications as of 07/20/2021  Medication Sig   Blood Glucose Monitoring Suppl (ONE TOUCH ULTRA 2) w/Device KIT Use to check blood sugar up to 2 times a day   calcium carbonate (OS-CAL) 600 MG TABS tablet Take 1,200 mg by mouth daily with breakfast.    carvedilol (COREG) 12.5 MG tablet TAKE 1 TABLET BY MOUTH TWICE DAILY   digoxin (LANOXIN) 0.125 MG tablet Take 1 tablet (125 mcg total) by mouth daily.   gabapentin (NEURONTIN) 100 MG capsule Take 1 capsule (100 mg total) by mouth at bedtime.   glucose blood (ONE TOUCH ULTRA TEST) test strip Check blood sugar up to 2 times a day and as instructed. Dx E11.9   Insulin Glargine (BASAGLAR KWIKPEN) 100 UNIT/ML INJECT SUBCUTANEOUSLY 18   UNITS (0.18ML) DAILY   Insulin Pen Needle (B-D ULTRAFINE III SHORT PEN) 31G X 8 MM MISC Use to inject insulin daily   Lancets (ONETOUCH ULTRASOFT) lancets Use to check blood sugar up to 2 times a day.   metFORMIN (GLUCOPHAGE-XR) 500 MG 24 hr tablet Take 1 tablet (500 mg total) by mouth daily with breakfast.   Multiple Vitamins-Minerals (ADULT GUMMY PO) Take 2 tablets by mouth daily.   Omega-3 Fatty Acids (FISH OIL  PO) Take 800 mg by mouth daily.   omeprazole (PRILOSEC) 40 MG capsule TAKE 1 CAPSULE DAILY AS    NEEDED FOR INDIGESTION   sacubitril-valsartan (ENTRESTO) 97-103 MG Take 1 tablet by mouth 2 (two) times daily.   spironolactone (ALDACTONE) 25 MG tablet Take 1 tablet (25 mg total) by mouth daily.   No facility-administered encounter medications on file as of 07/20/2021.     Allergies (verified) Mobic [meloxicam], Tramadol, and Diclofenac   History: Past Medical History:  Diagnosis Date   Acute blood loss anemia 11/20/2016   AICD (automatic cardioverter/defibrillator) present    a. 06/2017 s/p MDT DTMA 1QQ [place] MRI Quad CRT-D SureScan (ser # [id number]).   Chronic combined systolic (congestive) and diastolic (congestive) heart failure ([place])    a. 11/2016 Echo: EF 20-25%, glob HK, antsept, ant, apical HK, Gr1 DD, mild MR, mildly dil LA, nl RV fxn; b. 05/2017 Echo: Ef 20-25%, Gr2 DD, prominent apical trabeculations; c. 12/2017 Echo: EF 30-35%, Gr1 DD, glob HK.   COVID-19 03/08/2021   Diabetes mellitus ([place])    Essential hypertension    Family history of adverse reaction to anesthesia    mom had a hard time waking up   GERD (gastroesophageal reflux disease)    NICM (nonischemic cardiomyopathy) ([place])    a. 11/2016 Cath: nl cors, EF 25-30%; b. 11/2016 Echo: EF 20-25%; c. 05/2017 Echo: EF 20-25%; d. 12/2017 Echo: EF 30-35%, Gr1 DD.   Slipped intervertebral disc    L4 L5   STEMI (ST elevation myocardial infarction) ([place])    a. 11/2016 ST elevation -->Nl cors on cath.   Past Surgical History:  Procedure Laterality Date   BIV ICD INSERTION CRT-D  06/26/2017   BIV ICD INSERTION CRT-D N/A 06/26/2017   Procedure: BIV ICD INSERTION CRT-D;  Surgeon: [name], MD;  Location: [facility];  Service: Cardiovascular;  Laterality: N/A;   CERVICAL DISC ARTHROPLASTY N/A 07/24/2017   Procedure: CERVICAL ANTERIOR [place] ARTHROPLASTY C5-C7;  Surgeon: [name], MD;  Location: [facility];  Service: Neurosurgery;  Laterality: N/A;   COLONOSCOPY WITH PROPOFOL N/A 12/03/2016   Procedure: COLONOSCOPY WITH PROPOFOL;  Surgeon: [name], MD;  Location: [facility];  Service: Endoscopy;  Laterality: N/A;   ESOPHAGOGASTRODUODENOSCOPY (EGD) WITH PROPOFOL N/A 12/03/2016   Procedure: ESOPHAGOGASTRODUODENOSCOPY (EGD) WITH PROPOFOL;  Surgeon: [name], MD;   Location: [facility];  Service: Endoscopy;  Laterality: N/A;   FINGER SURGERY Right 2012   index finger   GASTRIC BYPASS     GIVENS CAPSULE STUDY N/A 01/07/2017   Procedure: GIVENS CAPSULE STUDY;  Surgeon: [name], MD;  Location: [facility];  Service: Endoscopy;  Laterality: N/A;   LEFT HEART CATH AND CORONARY ANGIOGRAPHY N/A 12/04/2016   Procedure: Left Heart Cath and Coronary Angiography;  Surgeon: [name], MD;  Location: [facility];  Service: Cardiovascular;  Laterality: N/A;   SHOULDER ARTHROSCOPY WITH OPEN ROTATOR CUFF REPAIR Left 10/04/2017   Procedure: SHOULDER ARTHROSCOPY WITH ROTATOR CUFF REPAIR, SUBACROMIAL DECOMPRESSION,OPEN BICEP TENODESIS, EXTENSIVE DEBRIDEMENT;  Surgeon: [name], MD;  Location: [facility];  Service: Orthopedics;  Laterality: Left;   Family History  Problem Relation Age of Onset   Diabetes Mother    Hypertension Mother    Cancer Mother        breast   Diabetes Father    Hypertension Father    Cancer Father        colon   Cancer Maternal Grandfather  prostate   Stroke Maternal Grandfather        CVA   Social History   Socioeconomic History   Marital status: Married    Spouse name: Not on file   Number of children: Not on file   Years of education: Not on file   Highest education level: Not on file  Occupational History   Occupation: Makes airplane filters, vault salesman    Employer: [employer]   Occupation: disability  Tobacco Use   Smoking status: Never   Smokeless tobacco: Never  Vaping Use   Vaping Use: Never used  Substance and Sexual Activity   Alcohol use: Not Currently   Drug use: No   Sexual activity: Yes  Other Topics Concern   Not on file  Social History Narrative   Regular exercise-yes, walking one mile per day   Diet: fast food, diet soda, unsweeted tea   Social Determinants of Health   Financial Resource Strain: Low Risk    Difficulty of Paying Living Expenses: Not hard at all   Food Insecurity: No Food Insecurity   Worried About Charity fundraiser in the Last Year: Never true   [place] in the Last Year: Never true  Transportation Needs: No Transportation Needs   Lack of Transportation (Medical): No   Lack of Transportation (Non-Medical): No  Physical Activity: Insufficiently Active   Days of Exercise per Week: 3 days   Minutes of Exercise per Session: 30 min  Stress: No Stress Concern Present   Feeling of Stress : Not at all  Social Connections: Socially Integrated   Frequency of Communication with Friends and Family: More than three times a week   Frequency of Social Gatherings with Friends and Family: Three times a week   Attends Religious Services: More than 4 times per year   Active Member of Clubs or Organizations: Yes   Attends Archivist Meetings: Never   Marital Status: Married    Tobacco Counseling Counseling given: Not Answered   Clinical Intake:  Pre-visit preparation completed: Yes  Pain : No/denies pain     BMI - recorded: 28.87 Nutritional Status: BMI 25 -29 Overweight Nutritional Risks: None Diabetes: Yes CBG done?: No (telephone visit) Did pt. bring in CBG monitor from home?: No  How often do you need to have someone help you when you read instructions, pamphlets, or other written materials from your doctor or pharmacy?: 1 - Never  Diabetes:  Is the patient diabetic?  Yes  If diabetic, was a CBG obtained today?  No  , visit completed over the phone. Did the patient bring in their glucometer from home?  No  , visit completed over the phone. How often do you monitor your CBG's? Patient states as needed.   Financial Strains and Diabetes Management:  Are you having any financial strains with the device, your supplies or your medication? No .  Does the patient want to be seen by Chronic Care Management for management of their diabetes?  No  Would the patient like to be referred to a Nutritionist or for  Diabetic Management?  No   Diabetic Exams:  Diabetic Eye Exam: Completed 05/13/21.  Diabetic Foot Exam: Completed 05/01/21. Pt has been advised about the importance in completing this exam.  Interpreter Needed?: No  Information entered by :: Orrin Brigham LPN   Activities of Daily Living In your present state of health, do you have any difficulty performing the following activities: 07/20/2021  Hearing?  N  Vision? N  Comment wears contacts  Difficulty concentrating or making decisions? N  Walking or climbing stairs? N  Dressing or bathing? N  Doing errands, shopping? N  Preparing Food and eating ? N  Using the Toilet? N  In the past six months, have you accidently leaked urine? Y  Do you have problems with loss of bowel control? N  Managing your Medications? N  Managing your Finances? N  Housekeeping or managing your Housekeeping? N  Some recent data might be hidden    Patient Care Team: Excell Seltzer, MD as PCP - General Regan Lemming, MD as PCP - Electrophysiology (Cardiology) End, Cristal Deer, MD as PCP - Cardiology (Cardiology) Delma Freeze, FNP as Nurse Practitioner (Family Medicine)  Indicate any recent Medical Services you may have received from other than [facility] providers in the past year (date may be approximate).     Assessment:   This is a routine wellness examination for Philip Ponce.  Hearing/Vision screen Hearing Screening - Comments:: No issues Vision Screening - Comments:: Last exam 04/2021, wears contact  Dietary issues and exercise activities discussed: Current Exercise Habits: Structured exercise class, Type of exercise: strength training/weights, Time (Minutes): 30, Frequency (Times/Week): 3, Weekly Exercise (Minutes/Week): 90, Intensity: Moderate, Exercise limited by: cardiac condition(s)   Goals Addressed             This Visit's Progress    Patient Stated       Would like to maintain your health.        Depression Screen PHQ 2/9  Scores 07/20/2021 07/19/2020 02/18/2018 11/19/2017 09/10/2017 08/05/2017 05/06/2017  PHQ - 2 Score 0 0 0 0 0 0 0  PHQ- 9 Score - 0 - - - - -    Fall Risk Fall Risk  07/20/2021 07/19/2020 08/19/2018 02/18/2018 11/19/2017  Falls in the past year? 0 0 0 No No  Number falls in past yr: 0 0 0 - -  Injury with Fall? 0 0 0 - -  Risk for fall due to : No Fall Risks Medication side effect - - -  Follow up Falls prevention discussed Falls evaluation completed;Falls prevention discussed - - -    FALL RISK PREVENTION PERTAINING TO THE HOME:  Any stairs in or around the home? Yes  If so, are there any without handrails? No  Home free of loose throw rugs in walkways, pet beds, electrical cords, etc? Yes  Adequate lighting in your home to reduce risk of falls? Yes   ASSISTIVE DEVICES UTILIZED TO PREVENT FALLS:  Life alert? No  Use of a cane, walker or w/c? No  Grab bars in the bathroom? Yes  Shower chair or bench in shower? No  Elevated toilet seat or a handicapped toilet? No   TIMED UP AND GO:  Was the test performed? No . Visit completed over the phone.   Cognitive Function: Normal cognitive status assessed by this Nurse Health Advisor. No abnormalities found.   MMSE - Mini Mental State Exam 07/19/2020  Orientation to time 5  Orientation to Place 5  Registration 3  Attention/ Calculation 0  Attention/Calculation-comments can't spell well  Recall 2  Language- repeat 1        Immunizations Immunization History  Administered Date(s) Administered   Influenza, Seasonal, Injecte, Preservative Fre 10/07/2012, 09/28/2013, 10/19/2016   Influenza,inj,Quad PF,6+ Mos 07/19/2015, 06/27/2017, 07/15/2018, 07/23/2019, 07/26/2020   Influenza-Unspecified 07/25/2016   PFIZER(Purple Top)SARS-COV-2 Vaccination 12/17/2019, 01/11/2020, 06/28/2020   Pneumococcal Polysaccharide-23 10/07/2012, 12/24/2017  Tdap 02/27/2011   Zoster Recombinat (Shingrix) 09/23/2020, 03/31/2021    TDAP status: Due,  Education has been provided regarding the importance of this vaccine. Advised may receive this vaccine at local pharmacy or Health Dept. Aware to provide a copy of the vaccination record if obtained from local pharmacy or Health Dept. Verbalized acceptance and understanding.  Flu Vaccine status: Due, Education has been provided regarding the importance of this vaccine. Advised may receive this vaccine at local pharmacy or Health Dept. Aware to provide a copy of the vaccination record if obtained from local pharmacy or Health Dept. Verbalized acceptance and understanding.  Pneumococcal vaccine status: Due, Education has been provided regarding the importance of this vaccine. Advised may receive this vaccine at local pharmacy or Health Dept. Aware to provide a copy of the vaccination record if obtained from local pharmacy or Health Dept. Verbalized acceptance and understanding.  Covid-19 vaccine status: Information provided on how to obtain vaccines.   Qualifies for Shingles Vaccine? Yes   Zostavax completed No   Shingrix Completed?: Yes  Screening Tests Health Maintenance  Topic Date Due   HIV Screening  Never done   Pneumococcal Vaccine 39-32 Years old (3 - PCV) 12/25/2018   OPHTHALMOLOGY EXAM  03/06/2019   COVID-19 Vaccine (4 - Booster for Pfizer series) 08/23/2020   TETANUS/TDAP  02/26/2021   INFLUENZA VACCINE  04/24/2021   HEMOGLOBIN A1C  11/05/2021   COLONOSCOPY (Pts 45-66yrs Insurance coverage will need to be confirmed)  12/03/2021   FOOT EXAM  05/01/2022   Hepatitis C Screening  Completed   Zoster Vaccines- Shingrix  Completed   HPV VACCINES  Aged Out    Health Maintenance  Health Maintenance Due  Topic Date Due   HIV Screening  Never done   Pneumococcal Vaccine 20-35 Years old (3 - PCV) 12/25/2018   OPHTHALMOLOGY EXAM  03/06/2019   COVID-19 Vaccine (4 - Booster for Pfizer series) 08/23/2020   TETANUS/TDAP  02/26/2021   INFLUENZA VACCINE  04/24/2021    Colorectal cancer  screening: Type of screening: Colonoscopy. Completed 12/03/16. Repeat every 5 years  Lung Cancer Screening: (Low Dose CT Chest recommended if Age 65-80 years, 30 pack-year currently smoking OR have quit w/in 15years.) does not qualify.     Additional Screening:  Hepatitis C Screening: Completed 07/19/20  Vision Screening: Recommended annual ophthalmology exams for early detection of glaucoma and other disorders of the eye. Is the patient up to date with their annual eye exam?  Yes  Who is the provider or what is the name of the office in which the patient attends annual eye exams? Patient unable to provide information at this time.    Dental Screening: Recommended annual dental exams for proper oral hygiene  Community Resource Referral / Chronic Care Management: CRR required this visit?  No   CCM required this visit?  No      Plan:     I have personally reviewed and noted the following in the patient's chart:   Medical and social history Use of alcohol, tobacco or illicit drugs  Current medications and supplements including opioid prescriptions. Patient is not currently taking opioid prescriptions. Functional ability and status Nutritional status Physical activity Advanced directives List of other physicians Hospitalizations, surgeries, and ER visits in previous 12 months Vitals Screenings to include cognitive, depression, and falls Referrals and appointments  In addition, I have reviewed and discussed with patient certain preventive protocols, quality metrics, and best practice recommendations. A written personalized care plan for preventive  services as well as general preventive health recommendations were provided to patient. Due to this being a telephonic visit, the after visit summary with patients personalized plan was offered to patient via mail or my-chart. Patient would like to access on my-chart    Loma Messing, LPN   [id number]   Nurse Health  Advisor  Nurse Notes: None

## 2021-07-20 ENCOUNTER — Ambulatory Visit (INDEPENDENT_AMBULATORY_CARE_PROVIDER_SITE_OTHER): Payer: Medicare HMO

## 2021-07-20 VITALS — Ht 71.0 in | Wt 207.0 lb

## 2021-07-20 DIAGNOSIS — Z0000 Encounter for general adult medical examination without abnormal findings: Secondary | ICD-10-CM

## 2021-07-20 NOTE — Patient Instructions (Addendum)
Philip Ponce , Thank you for taking time to complete for your Medicare Wellness Visit. I appreciate your ongoing commitment to your health goals. Please review the following plan we discussed and let me know if I can assist you in the future.   Screening recommendations/referrals: Colonoscopy: Up to date, completed 12/03/16 Due 12/03/21 Recommended yearly ophthalmology/optometry visit for glaucoma screening and checkup Recommended yearly dental visit for hygiene and checkup  Vaccinations: Influenza vaccine: Due-May obtain vaccine at our office or your local pharmacy. Pneumococcal vaccine: Due-May obtain vaccine at our office or your local pharmacy. Discuss with PCP Tdap vaccine: Due-May obtain vaccine at your local pharmacy. Shingles vaccine: up to date   Covid-19: Discuss with pharmacy   Advanced directives: Copy on file   Conditions/risks identified: see problem list  Next appointment: Follow up in one year for your annual wellness visit 07/23/22 @ 12:00pm, this will be a telephone visit per your request.  Preventive Care 40-64 Years, Male Preventive care refers to lifestyle choices and visits with your health care provider that can promote health and wellness. What does preventive care include? A yearly physical exam. This is also called an annual well check. Dental exams once or twice a year. Routine eye exams. Ask your health care provider how often you should have your eyes checked. Personal lifestyle choices, including: Daily care of your teeth and gums. Regular physical activity. Eating a healthy diet. Avoiding tobacco and drug use. Limiting alcohol use. Practicing safe sex. Taking low-dose aspirin every day starting at age 42. What happens during an annual well check? The services and screenings done by your health care provider during your annual well check will depend on your age, overall health, lifestyle risk factors, and family history of disease. Counseling  Your  health care provider may ask you questions about your: Alcohol use. Tobacco use. Drug use. Emotional well-being. Home and relationship well-being. Sexual activity. Eating habits. Work and work Statistician. Screening  You may have the following tests or measurements: Height, weight, and BMI. Blood pressure. Lipid and cholesterol levels. These may be checked every 5 years, or more frequently if you are over 45 years old. Skin check. Lung cancer screening. You may have this screening every year starting at age 51 if you have a 30-pack-year history of smoking and currently smoke or have quit within the past 15 years. Fecal occult blood test (FOBT) of the stool. You may have this test every year starting at age 50. Flexible sigmoidoscopy or colonoscopy. You may have a sigmoidoscopy every 5 years or a colonoscopy every 10 years starting at age 65. Prostate cancer screening. Recommendations will vary depending on your family history and other risks. Hepatitis C blood test. Hepatitis B blood test. Sexually transmitted disease (STD) testing. Diabetes screening. This is done by checking your blood sugar (glucose) after you have not eaten for a while (fasting). You may have this done every 1-3 years. Discuss your test results, treatment options, and if necessary, the need for more tests with your health care provider. Vaccines  Your health care provider may recommend certain vaccines, such as: Influenza vaccine. This is recommended every year. Tetanus, diphtheria, and acellular pertussis (Tdap, Td) vaccine. You may need a Td booster every 10 years. Zoster vaccine. You may need this after age 43. Pneumococcal 13-valent conjugate (PCV13) vaccine. You may need this if you have certain conditions and have not been vaccinated. Pneumococcal polysaccharide (PPSV23) vaccine. You may need one or two doses if you smoke cigarettes or  if you have certain conditions. Talk to your health care provider about  which screenings and vaccines you need and how often you need them. This information is not intended to replace advice given to you by your health care provider. Make sure you discuss any questions you have with your health care provider. Document Released: 10/07/2015 Document Revised: 05/30/2016 Document Reviewed: 07/12/2015 Elsevier Interactive Patient Education  2017 [place] Prevention in the Home Falls can cause injuries. They can happen to people of all ages. There are many things you can do to make your home safe and to help prevent falls. What can I do on the outside of my home? Regularly fix the edges of walkways and driveways and fix any cracks. Remove anything that might make you trip as you walk through a door, such as a raised step or threshold. Trim any bushes or trees on the path to your home. Use bright outdoor lighting. Clear any walking paths of anything that might make someone trip, such as rocks or tools. Regularly check to see if handrails are loose or broken. Make sure that both sides of any steps have handrails. Any raised decks and porches should have guardrails on the edges. Have any leaves, snow, or ice cleared regularly. Use sand or salt on walking paths during winter. Clean up any spills in your garage right away. This includes oil or grease spills. What can I do in the bathroom? Use night lights. Install grab bars by the toilet and in the tub and shower. Do not use towel bars as grab bars. Use non-skid mats or decals in the tub or shower. If you need to sit down in the shower, use a plastic, non-slip stool. Keep the floor dry. Clean up any water that spills on the floor as soon as it happens. Remove soap buildup in the tub or shower regularly. Attach bath mats securely with double-sided non-slip rug tape. Do not have throw rugs and other things on the floor that can make you trip. What can I do in the bedroom? Use night lights. Make sure that you  have a light by your bed that is easy to reach. Do not use any sheets or blankets that are too big for your bed. They should not hang down onto the floor. Have a firm chair that has side arms. You can use this for support while you get dressed. Do not have throw rugs and other things on the floor that can make you trip. What can I do in the kitchen? Clean up any spills right away. Avoid walking on wet floors. Keep items that you use a lot in easy-to-reach places. If you need to reach something above you, use a strong step stool that has a grab bar. Keep electrical cords out of the way. Do not use floor polish or wax that makes floors slippery. If you must use wax, use non-skid floor wax. Do not have throw rugs and other things on the floor that can make you trip. What can I do with my stairs? Do not leave any items on the stairs. Make sure that there are handrails on both sides of the stairs and use them. Fix handrails that are broken or loose. Make sure that handrails are as long as the stairways. Check any carpeting to make sure that it is firmly attached to the stairs. Fix any carpet that is loose or worn. Avoid having throw rugs at the top or bottom of the stairs.  If you do have throw rugs, attach them to the floor with carpet tape. Make sure that you have a light switch at the top of the stairs and the bottom of the stairs. If you do not have them, ask someone to add them for you. What else can I do to help prevent falls? Wear shoes that: Do not have high heels. Have rubber bottoms. Are comfortable and fit you well. Are closed at the toe. Do not wear sandals. If you use a stepladder: Make sure that it is fully opened. Do not climb a closed stepladder. Make sure that both sides of the stepladder are locked into place. Ask someone to hold it for you, if possible. Clearly mark and make sure that you can see: Any grab bars or handrails. First and last steps. Where the edge of each  step is. Use tools that help you move around (mobility aids) if they are needed. These include: Canes. Walkers. Scooters. Crutches. Turn on the lights when you go into a dark area. Replace any light bulbs as soon as they burn out. Set up your furniture so you have a clear path. Avoid moving your furniture around. If any of your floors are uneven, fix them. If there are any pets around you, be aware of where they are. Review your medicines with your doctor. Some medicines can make you feel dizzy. This can increase your chance of falling. Ask your doctor what other things that you can do to help prevent falls. This information is not intended to replace advice given to you by your health care provider. Make sure you discuss any questions you have with your health care provider. Document Released: 07/07/2009 Document Revised: 02/16/2016 Document Reviewed: 10/15/2014 Elsevier Interactive Patient Education  2017 [employer].

## 2021-07-26 ENCOUNTER — Telehealth: Payer: Self-pay | Admitting: Family Medicine

## 2021-07-26 DIAGNOSIS — R200 Anesthesia of skin: Secondary | ICD-10-CM

## 2021-07-26 DIAGNOSIS — E119 Type 2 diabetes mellitus without complications: Secondary | ICD-10-CM

## 2021-07-26 NOTE — Telephone Encounter (Signed)
-----   Message from Ellamae Sia sent at 07/26/2021  8:30 AM EDT ----- Regarding: Lab orders for Thursday, 11.10.22 Lab orders for a 3 month follow up appt.

## 2021-08-04 ENCOUNTER — Other Ambulatory Visit: Payer: Medicare HMO

## 2021-08-08 ENCOUNTER — Encounter: Payer: Self-pay | Admitting: Family Medicine

## 2021-08-08 ENCOUNTER — Ambulatory Visit (INDEPENDENT_AMBULATORY_CARE_PROVIDER_SITE_OTHER): Payer: Medicare HMO | Admitting: Family Medicine

## 2021-08-08 ENCOUNTER — Other Ambulatory Visit: Payer: Self-pay

## 2021-08-08 VITALS — BP 118/82 | HR 63 | Temp 98.2°F | Ht 71.25 in | Wt 207.5 lb

## 2021-08-08 DIAGNOSIS — I152 Hypertension secondary to endocrine disorders: Secondary | ICD-10-CM

## 2021-08-08 DIAGNOSIS — Z23 Encounter for immunization: Secondary | ICD-10-CM | POA: Diagnosis not present

## 2021-08-08 DIAGNOSIS — E1159 Type 2 diabetes mellitus with other circulatory complications: Secondary | ICD-10-CM

## 2021-08-08 DIAGNOSIS — I428 Other cardiomyopathies: Secondary | ICD-10-CM | POA: Diagnosis not present

## 2021-08-08 DIAGNOSIS — Z125 Encounter for screening for malignant neoplasm of prostate: Secondary | ICD-10-CM | POA: Diagnosis not present

## 2021-08-08 DIAGNOSIS — E119 Type 2 diabetes mellitus without complications: Secondary | ICD-10-CM

## 2021-08-08 DIAGNOSIS — Z794 Long term (current) use of insulin: Secondary | ICD-10-CM | POA: Diagnosis not present

## 2021-08-08 DIAGNOSIS — R200 Anesthesia of skin: Secondary | ICD-10-CM | POA: Diagnosis not present

## 2021-08-08 DIAGNOSIS — Z3009 Encounter for other general counseling and advice on contraception: Secondary | ICD-10-CM | POA: Diagnosis not present

## 2021-08-08 DIAGNOSIS — Z0000 Encounter for general adult medical examination without abnormal findings: Secondary | ICD-10-CM

## 2021-08-08 DIAGNOSIS — I5042 Chronic combined systolic (congestive) and diastolic (congestive) heart failure: Secondary | ICD-10-CM

## 2021-08-08 NOTE — Assessment & Plan Note (Signed)
Euvolemic in office  Today.  Entresto 97/103 mg daily   aldactone 25 mg daily dogoxin 0.125 mg daily  coreg 12.5 mg BID 

## 2021-08-08 NOTE — Assessment & Plan Note (Signed)
Due for re-eval. 

## 2021-08-08 NOTE — Assessment & Plan Note (Addendum)
Stable, chronic.  Continue current medication.   Entresto 97/103 mg daily   aldactone 25 mg daily dogoxin 0.125 mg daily  coreg 12.5 mg BID 

## 2021-08-08 NOTE — Progress Notes (Signed)
Patient ID: Philip Ponce, male    DOB: 1971/09/23, 50 y.o.   MRN: [id number]  This visit was conducted in person.  BP 118/82   Pulse 63   Temp 98.2 F (36.8 C) (Temporal)   Ht 5' 11.25" (1.81 m)   Wt 207 lb 8 oz (94.1 kg)   SpO2 99%   BMI 28.74 kg/m    CC:  Chief Complaint  Patient presents with   Annual Exam    Part 2    Subjective:   HPI: Philip Ponce is a 50 y.o. male presenting on 08/08/2021 for Annual Exam (Part 2) The patient presents for annual medicare wellness, complete physical and review of chronic health problems. He/She also has the following acute concerns today: none  The patient saw a LPN or RN for medicare wellness visit.  Prevention and wellness was reviewed in detail. Note reviewed and important notes copied below.  Screening recommendations/referrals: Colonoscopy: Up to date, completed 12/03/16 Due 12/03/21 Recommended yearly ophthalmology/optometry visit for glaucoma screening and checkup Recommended yearly dental visit for hygiene and checkup   Vaccinations: Influenza vaccine: Due-May obtain vaccine at our office or your local pharmacy. Pneumococcal vaccine: Due-May obtain vaccine at our office or your local pharmacy. Discuss with PCP Tdap vaccine: Due-May obtain vaccine at your local pharmacy. Shingles vaccine: up to date   Covid-19: Discuss with pharmacy  08/08/21  Cholesterol due for  re-eval. Diabetes:  due for re-eval. On metformin, Balsalgar 18 units Using medications without difficulties: Hypoglycemic episodes:occ  at night if taking insulin late. Hyperglycemic episodes: none Feet problems:no  ulcer Blood Sugars averaging: not checking eye exam within last year: yes  Hypertension:    At goal on current regimen. BP Readings from Last 3 Encounters:  08/08/21 118/82  05/12/21 124/86  05/05/21 128/82  Using medication without problems or lightheadedness:  none Chest pain with exertion: none Edema:none Short of breath:  none Average home BPs: Other issues:  Combined systolic and diastolic heart failure: Followed by Cardiology. Entresto 97/103 mg daily   aldactone 25 mg daily dogoxin 0.125 mg daily  coreg 12.5 mg BID   Exercise: walking daily  Diet:  moderate  Wt Readings from Last 3 Encounters:  08/08/21 207 lb 8 oz (94.1 kg)  07/20/21 207 lb (93.9 kg)  05/12/21 207 lb 8 oz (94.1 kg)     Patient Care Team: [name], MD as PCP - General [name], MD as PCP - Electrophysiology (Cardiology) End, Harrell Gave, MD as PCP - Cardiology (Cardiology) Alisa Graff, FNP as Nurse Practitioner (Family Medicine)  Relevant past medical, surgical, family and social history reviewed and updated as indicated. Interim medical history since our last visit reviewed. Allergies and medications reviewed and updated. Outpatient Medications Prior to Visit  Medication Sig Dispense Refill   Blood Glucose Monitoring Suppl (ONE TOUCH ULTRA 2) w/Device KIT Use to check blood sugar up to 2 times a day 1 kit 0   calcium carbonate (OS-CAL) 600 MG TABS tablet Take 1,200 mg by mouth daily with breakfast.      carvedilol (COREG) 12.5 MG tablet TAKE 1 TABLET BY MOUTH TWICE DAILY 180 tablet 3   digoxin (LANOXIN) 0.125 MG tablet Take 1 tablet (125 mcg total) by mouth daily. 30 tablet 3   gabapentin (NEURONTIN) 100 MG capsule Take 1 capsule (100 mg total) by mouth at bedtime. 30 capsule 1   glucose blood (ONE TOUCH ULTRA TEST) test strip Check blood sugar up to  2 times a day and as instructed. Dx E11.9 100 each 5   Insulin Glargine (BASAGLAR KWIKPEN) 100 UNIT/ML INJECT SUBCUTANEOUSLY 18   UNITS (0.18ML) DAILY 15 mL 1   Insulin Pen Needle (B-D ULTRAFINE III SHORT PEN) 31G X 8 MM MISC Use to inject insulin daily 90 each 3   Lancets (ONETOUCH ULTRASOFT) lancets Use to check blood sugar up to 2 times a day. 100 each 5   metFORMIN (GLUCOPHAGE-XR) 500 MG 24 hr tablet Take 1 tablet (500 mg total) by mouth daily with  breakfast. 90 tablet 3   Multiple Vitamins-Minerals (ADULT GUMMY PO) Take 2 tablets by mouth daily.     omeprazole (PRILOSEC) 40 MG capsule TAKE 1 CAPSULE DAILY AS    NEEDED FOR INDIGESTION 90 capsule 3   sacubitril-valsartan (ENTRESTO) 97-103 MG Take 1 tablet by mouth 2 (two) times daily. 180 tablet 3   spironolactone (ALDACTONE) 25 MG tablet Take 1 tablet (25 mg total) by mouth daily. 90 tablet 3   Omega-3 Fatty Acids (FISH OIL PO) Take 800 mg by mouth daily. (Patient not taking: Reported on 08/08/2021)     No facility-administered medications prior to visit.     Per HPI unless specifically indicated in ROS section below Review of Systems  Constitutional:  Negative for fatigue and fever.  HENT:  Negative for ear pain.   Eyes:  Negative for pain.  Respiratory:  Negative for cough and shortness of breath.   Cardiovascular:  Negative for chest pain, palpitations and leg swelling.  Gastrointestinal:  Negative for abdominal pain.  Genitourinary:  Negative for dysuria.  Musculoskeletal:  Negative for arthralgias.  Neurological:  Negative for syncope, light-headedness and headaches.  Psychiatric/Behavioral:  Negative for dysphoric mood.   Objective:  BP 118/82   Pulse 63   Temp 98.2 F (36.8 C) (Temporal)   Ht 5' 11.25" (1.81 m)   Wt 207 lb 8 oz (94.1 kg)   SpO2 99%   BMI 28.74 kg/m   Wt Readings from Last 3 Encounters:  08/08/21 207 lb 8 oz (94.1 kg)  07/20/21 207 lb (93.9 kg)  05/12/21 207 lb 8 oz (94.1 kg)      Physical Exam    Results for orders placed or performed in visit on 06/30/21  CUP PACEART REMOTE DEVICE CHECK  Result Value Ref Range   Date Time Interrogation Session [id number]    Pulse Generator Manufacturer MERM    Pulse Gen Model [id number] Claria MRI Quad CRT-D    Pulse Gen Serial Number [id number] [facility]    Implantable Pulse Generator Type Cardiac Resynch Therapy Defibulator    Implantable Pulse Generator Implant Date [id number]     Implantable Lead Manufacturer MERM    Implantable Lead Model 4398 Attain Performa Straight MRI SureScan    Implantable Lead Serial Number [id number]    Implantable Lead Implant Date [id number]    Implantable Lead Location Detail 1 UNKNOWN    Implantable Lead Location [id number]    Implantable Lead Manufacturer MERM    Implantable Lead Model 5076 CapSureFix Novus MRI SureScan    Implantable Lead Serial Number [id number]    Implantable Lead Implant Date [id number]    Implantable Lead Location Detail 1 APPENDAGE    Implantable Lead Location [id number]    Implantable Lead Manufacturer [facility]    Implantable Lead Model [id number] Sprint Quattro Secure S MRI SureScan    Implantable Lead Serial Number [id number]    Implantable Lead Implant Date [id number]  Implantable Lead Location Detail 1 APEX    Implantable Lead Location [id number]    Lead Channel Setting Sensing Sensitivity 0.3 mV   Lead Channel Setting Pacing Amplitude 2 V   Lead Channel Setting Pacing Pulse Width 0.4 ms   Lead Channel Setting Pacing Amplitude 2.5 V   Lead Channel Setting Pacing Pulse Width 0.4 ms   Lead Channel Setting Pacing Amplitude 1.5 V   Lead Channel Setting Pacing Capture Mode Fixed Pacing    Lead Channel Impedance Value 399 ohm   Lead Channel Sensing Intrinsic Amplitude 2.125 mV   Lead Channel Sensing Intrinsic Amplitude 2.125 mV   Lead Channel Pacing Threshold Amplitude 0.625 V   Lead Channel Pacing Threshold Pulse Width 0.4 ms   Lead Channel Impedance Value 304 ohm   Lead Channel Impedance Value 247 ohm   Lead Channel Sensing Intrinsic Amplitude 6.875 mV   Lead Channel Sensing Intrinsic Amplitude 6.875 mV   Lead Channel Pacing Threshold Amplitude 0.875 V   Lead Channel Pacing Threshold Pulse Width 0.4 ms   HighPow Impedance 49 ohm   Lead Channel Impedance Value 608 ohm   Lead Channel Impedance Value 608 ohm   Lead Channel Impedance Value 551 ohm   Lead Channel Impedance Value 456 ohm   Lead Channel Impedance Value 551 ohm    Lead Channel Impedance Value 532 ohm   Lead Channel Impedance Value 361 ohm   Lead Channel Impedance Value 361 ohm   Lead Channel Impedance Value 342 ohm   Lead Channel Impedance Value 285 ohm   Lead Channel Impedance Value 180.5 ohm   Lead Channel Impedance Value 175.622    Lead Channel Impedance Value 159.265    Lead Channel Impedance Value 159.265    Lead Channel Impedance Value 155.455    Lead Channel Pacing Threshold Amplitude 1.25 V   Lead Channel Pacing Threshold Pulse Width 0.4 ms   Battery Status OK    Battery Remaining Longevity 29 mo   Battery Voltage 2.95 V   Brady Statistic RA Percent Paced 0.01 %   Brady Statistic RV Percent Paced 89.6 %   Brady Statistic AP VP Percent 0.01 %   Brady Statistic AS VP Percent 98.78 %   Brady Statistic AP VS Percent 0 %   Brady Statistic AS VS Percent 1.21 %    This visit occurred during the SARS-CoV-2 public health emergency.  Safety protocols were in place, including screening questions prior to the visit, additional usage of staff PPE, and extensive cleaning of exam room while observing appropriate contact time as indicated for disinfecting solutions.   COVID 19 screen:  No recent travel or known exposure to COVID19 The patient denies respiratory symptoms of COVID 19 at this time. The importance of social distancing was discussed today.   Assessment and Plan    The patient's preventative maintenance and recommended screening tests for an annual wellness exam were reviewed in full today. Brought up to date unless services declined.  Counselled on the importance of diet, exercise, and its role in overall health and mortality. The patient's FH and SH was reviewed, including their home life, tobacco status, and drug and alcohol status.   Vaccines: 3 x COVID vaccine, tdap due but ot covered, PNA uptodate, given flu Prostate Cancer Screen:  Due for re-eval. Colon Cancer Screen:  2018 due for repeat in 5 years.      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  Entresto 97/103 mg daily   aldactone 25 mg daily dogoxin 0.125 mg daily  coreg 12.5 mg BID      Hypertension associated with diabetes (HCC) (Chronic)    Stable, chronic.  Continue current medication.  Entresto 97/103 mg daily   aldactone 25 mg daily dogoxin 0.125 mg daily  coreg 12.5 mg BID         Non-ischemic cardiomyopathy (HCC) (Chronic)    Followed by cardiology.      Type 2 diabetes mellitus without complication, with long-term current use of insulin (HCC) (Chronic)    Due for re-eval.      Numbness of left foot   Other Visit Diagnoses     Routine general medical examination at a health care facility    -  Primary   Need for influenza vaccination       Relevant Orders   Flu Vaccine QUAD 6+ mos PF IM (Fluarix Quad PF) (Completed)   Prostate cancer screening       Relevant Orders   PSA, Medicare   Vasectomy evaluation       Relevant Orders   Ambulatory referral to Urology       Orders Placed This Encounter  Procedures   Flu Vaccine QUAD 6+ mos PF IM (Fluarix Quad PF)   PSA, Medicare   Ambulatory referral to Urology    Referral Priority:   Routine    Referral Type:   Consultation    Referral Reason:   Specialty Services Required    Requested Specialty:   Urology    Number of Visits Requested:   1       Eliezer Lofts, MD

## 2021-08-08 NOTE — Patient Instructions (Addendum)
Please stop at the lab to have labs drawn.  Referral to urology was placed for vasectomy consideration.  Keep working on healthy  low carb  diet and regular exercise!

## 2021-08-08 NOTE — Assessment & Plan Note (Signed)
Followed by cardiology 

## 2021-08-09 LAB — COMPREHENSIVE METABOLIC PANEL
ALT: 13 U/L (ref 0–53)
AST: 17 U/L (ref 0–37)
Albumin: 4 g/dL (ref 3.5–5.2)
Alkaline Phosphatase: 59 U/L (ref 39–117)
BUN: 16 mg/dL (ref 6–23)
CO2: 30 mEq/L (ref 19–32)
Calcium: 9 mg/dL (ref 8.4–10.5)
Chloride: 102 mEq/L (ref 96–112)
Creatinine, Ser: 1.03 mg/dL (ref 0.40–1.50)
GFR: 83.69 mL/min (ref >60.00)
Glucose, Bld: 191 mg/dL — ABNORMAL HIGH (ref 70–99)
Potassium: 4.6 mEq/L (ref 3.5–5.1)
Sodium: 138 mEq/L (ref 135–145)
Total Bilirubin: 0.4 mg/dL (ref 0.2–1.2)
Total Protein: 7.5 g/dL (ref 6.0–8.3)

## 2021-08-09 LAB — TSH: TSH: 0.61 u[IU]/mL (ref 0.35–5.50)

## 2021-08-09 LAB — PSA, MEDICARE: PSA: 0.16 ng/ml (ref 0.10–4.00)

## 2021-08-09 LAB — LIPID PANEL
Cholesterol: 125 mg/dL (ref 0–200)
HDL: 75.5 mg/dL (ref >39.00)
LDL Cholesterol: 38 mg/dL (ref 0–99)
NonHDL: 49.58
Total CHOL/HDL Ratio: 2
Triglycerides: 58 mg/dL (ref 0.0–149.0)
VLDL: 11.6 mg/dL (ref 0.0–40.0)

## 2021-08-09 LAB — VITAMIN B12: Vitamin B-12: 307 pg/mL (ref 211–911)

## 2021-08-09 LAB — HEMOGLOBIN A1C: Hgb A1c MFr Bld: 5.8 % (ref 4.6–6.5)

## 2021-08-10 ENCOUNTER — Telehealth: Payer: Self-pay | Admitting: Family Medicine

## 2021-08-10 NOTE — Chronic Care Management (AMB) (Signed)
  Chronic Care Management   Note  08/10/2021 Name: Philip Ponce MRN: [id number] DOB: 11-Aug-1971  Philip Ponce is a 50 y.o. year old male who is a primary care patient of Bedsole, Amy E, MD. I reached out to [name] by phone today in response to a referral sent by [name]'s PCP, [name], MD.   Philip Ponce was given information about Chronic Care Management services today including:  CCM service includes personalized support from designated clinical staff supervised by his physician, including individualized plan of care and coordination with other care providers 24/7 contact phone numbers for assistance for urgent and routine care needs. Service will only be billed when office clinical staff spend 20 minutes or more in a month to coordinate care. Only one practitioner may furnish and bill the service in a calendar month. The patient may stop CCM services at any time (effective at the end of the month) by phone call to the office staff.   Patient agreed to services and verbal consent obtained.   Follow up plan:   Tatjana Secretary/administrator

## 2021-09-08 ENCOUNTER — Telehealth: Payer: Self-pay

## 2021-09-08 NOTE — Chronic Care Management (AMB) (Signed)
Chronic Care Management Pharmacy Assistant   Name: Philip Ponce  MRN: [id number] DOB: Feb 25, 1971  Philip Ponce is an 50 y.o. year old male who presents for his initial CCM visit with the clinical pharmacist.  Reason for Encounter: Initial Questions   Conditions to be addressed/monitored: HTN and DMII   Recent office visits:  08/08/21-PCP-Amy Bedsole,MD-Patient presented for AWV. Labs ordered( diabetes at goal, normal range labs) screenings discussed, vaccinations discussed,flu shot give, referral for Urology-work on diet and exercise,no medication changes 05/12/21-PCP-Amy Bedsole,MD-Patient presented for left foot pain.Start gabapentin 100 mg at bedtime for likely diabetic neuropathy vs sciatica pain Hold Melatonin,consider checking B12 and Thyroid  05/05/21-PCP-Amy Bedsole,MD-Patient presented for follow up diabetes-A1C worse, track BG's and follow up 2 weeks, may have to change medications.  Recent consult visits:  06/30/21- Cardiology-Will [name]- no data found 06/13/21-Optometry-Robert Dickey-no data found 03/31/21-Cardiology- no data found   Hospital visits:  6/17/22[facility] ED- Patient presented for lumbar back pain and dark urine concerns. He is positive covid-19.Patient left AMA. 03/10/21-[facility]-Patient presented for nasal congestion,positive covid-19, and dark urine.UA (abnormal)-increase  oral fluid intake with goal of 64-128 ounces of water daily .  Medications: Outpatient Encounter Medications as of 09/08/2021  Medication Sig   Blood Glucose Monitoring Suppl (ONE TOUCH ULTRA 2) w/Device KIT Use to check blood sugar up to 2 times a day   calcium carbonate (OS-CAL) 600 MG TABS tablet Take 1,200 mg by mouth daily with breakfast.    carvedilol (COREG) 12.5 MG tablet TAKE 1 TABLET BY MOUTH TWICE DAILY   digoxin (LANOXIN) 0.125 MG tablet Take 1 tablet (125 mcg total) by mouth daily.   gabapentin (NEURONTIN) 100 MG capsule Take 1 capsule (100 mg  total) by mouth at bedtime.   glucose blood (ONE TOUCH ULTRA TEST) test strip Check blood sugar up to 2 times a day and as instructed. Dx E11.9   Insulin Glargine (BASAGLAR KWIKPEN) 100 UNIT/ML INJECT SUBCUTANEOUSLY 18   UNITS (0.18ML) DAILY   Insulin Pen Needle (B-D ULTRAFINE III SHORT PEN) 31G X 8 MM MISC Use to inject insulin daily   Lancets (ONETOUCH ULTRASOFT) lancets Use to check blood sugar up to 2 times a day.   metFORMIN (GLUCOPHAGE-XR) 500 MG 24 hr tablet Take 1 tablet (500 mg total) by mouth daily with breakfast.   Multiple Vitamins-Minerals (ADULT GUMMY PO) Take 2 tablets by mouth daily.   Omega-3 Fatty Acids (FISH OIL PO) Take 800 mg by mouth daily. (Patient not taking: Reported on 08/08/2021)   omeprazole (PRILOSEC) 40 MG capsule TAKE 1 CAPSULE DAILY AS    NEEDED FOR INDIGESTION   sacubitril-valsartan (ENTRESTO) 97-103 MG Take 1 tablet by mouth 2 (two) times daily.   spironolactone (ALDACTONE) 25 MG tablet Take 1 tablet (25 mg total) by mouth daily.   No facility-administered encounter medications on file as of 09/08/2021.     Lab Results  Component Value Date/Time   HGBA1C 5.8 08/08/2021 03:05 PM   HGBA1C 8.1 (A) 05/05/2021 09:41 AM   HGBA1C 7.5 (A) 02/02/2021 11:08 AM   HGBA1C 6.4 07/19/2020 08:31 AM   HGBA1C 8.4 (H) 08/04/2012 07:54 AM   MICROALBUR 1.6 07/19/2020 08:31 AM   MICROALBUR <0.7 07/23/2019 11:36 AM     BP Readings from Last 3 Encounters:  08/08/21 118/82  05/12/21 124/86  05/05/21 128/82    Patient contacted to review initial questions prior to visit with Al Corpus.  Have you seen any other providers since your last  visit with PCP? No  Any changes in your medications or health? No  Any side effects from any medications? No  Do you have an symptoms or problems not managed by your medications? No  Any concerns about your health right now?  The patient reports he is trying to focus on keeping healthy.  Has your provider asked that you  check blood pressure, blood sugar, or follow special diet at home? Yes The patient reports he can check BG's, unsure of accuracy with his unit   Do you get any type of exercise on a regular basis? Yes The patient tries to walk regularly for exercise and he goes to the gym   Can you think of a goal you would like to reach for your health? No  Do you have any problems getting your medications? No  The patient uses  [facility] mail order for his prescriptions   Is there anything that you would like to discuss during the appointment? No   Spoke with patient and reminded them to have all medications, supplements and any blood glucose and blood pressure readings available for review with pharmacist, at their telephone visit on 09/14/21 at [facility]:  Medication:  Last Fill: Day Supply Basaglar  06/22/21 84 Metformin XR $RemoveBefo'500mg'dRbZwOujFnj$  07/02/21 90 Entresto 97-$RemoveBefore'103mg'[id number]$  07/20/21 90  Care Gaps: Annual wellness visit in last year? Yes Most Recent BP reading: 118/82  63-P  08/08/21  If Diabetic: Most recent A1C reading: 5.8  08/08/21 Last eye exam / retinopathy screening:2019 Last diabetic foot exam:05/01/21  Marjo Bicker CPP notified  Avel Sensor, [place] [phone]  Total time spent for month CPA: 40 min.

## 2021-09-14 ENCOUNTER — Other Ambulatory Visit: Payer: Self-pay

## 2021-09-14 ENCOUNTER — Ambulatory Visit (INDEPENDENT_AMBULATORY_CARE_PROVIDER_SITE_OTHER): Payer: Medicare HMO | Admitting: Pharmacist

## 2021-09-14 DIAGNOSIS — E1159 Type 2 diabetes mellitus with other circulatory complications: Secondary | ICD-10-CM

## 2021-09-14 DIAGNOSIS — E119 Type 2 diabetes mellitus without complications: Secondary | ICD-10-CM

## 2021-09-14 DIAGNOSIS — I5042 Chronic combined systolic (congestive) and diastolic (congestive) heart failure: Secondary | ICD-10-CM

## 2021-09-14 DIAGNOSIS — E1169 Type 2 diabetes mellitus with other specified complication: Secondary | ICD-10-CM

## 2021-09-14 NOTE — Patient Instructions (Signed)
Visit Information  Phone number for Pharmacist: [phone]  Thank you for meeting with me to discuss your medications! I look forward to working with you to achieve your health care goals. Below is a summary of what we talked about during the visit:   Goals Addressed             This Visit's Progress    Monitor and Manage My Blood Sugar-Diabetes Type 2       Timeframe:  Long-Range Goal Priority:  High Start Date:     09/14/21                        Expected End Date:    09/14/22                   Follow Up Date June 2023   - check blood sugar at prescribed times - check blood sugar if I feel it is too high or too low - take the blood sugar meter to all doctor visits    Why is this important?   Checking your blood sugar at home helps to keep it from getting very high or very low.  Writing the results in a diary or log helps the doctor know how to care for you.  Your blood sugar log should have the time, date and the results.  Also, write down the amount of insulin or other medicine that you take.  Other information, like what you ate, exercise done and how you were feeling, will also be helpful.     Notes:         Care Plan : [place]  Updates made by Charlton Haws, RPH since 09/14/2021 12:00 AM     Problem: Hypertension, Hyperlipidemia, Diabetes, Heart Failure, and GERD   Priority: High     Long-Range Goal: Disease mgmt   Start Date: 09/14/2021  Expected End Date: 09/14/2022  This Visit's Progress: On track  Priority: High  Note:   Current Barriers:  Unable to independently monitor therapeutic efficacy  Pharmacist Clinical Goal(s):  Patient will achieve adherence to monitoring guidelines and medication adherence to achieve therapeutic efficacy through collaboration with PharmD and provider.   Interventions: 1:1 collaboration with [name], MD regarding development and update of comprehensive plan of care as evidenced by provider  attestation and co-signature Inter-disciplinary care team collaboration (see longitudinal plan of care) Comprehensive medication review performed; medication list updated in electronic medical record  Hypertension / Heart Failure (BP goal <130/80) -Controlled -Last ejection fraction: 40-50% (Date: 03/07/21) (improved from 30-35%) -HF type: Combined Systolic and Diastolic; NYHA Class: II (slight limitation of activity) -s/p BIV ICD -Current home BP readings: not checking; Denies hypotensive/hypertensive symptoms including dizziness -Current treatment: Carvedilol 12.5 mg BID Digoxin 0.125 mg daily Entresto 97-103 mg BID Spironolactone 25 mg daily -Medications previously tried: furosemide, isosorbide  -Educated on BP goals and benefits of medications for prevention of heart attack, stroke and kidney damage;Exercise goal of 150 minutes per week; -Educated on Benefits of medications for managing symptoms and prolonging life -Counseled to monitor BP at home periodically -Recommended to continue current medication  Hyperlipidemia: (LDL goal < 70) -Controlled - LDL is 38 without medication; pt has indication for statin (diabetes) but defers initiation -cath 11/2016 - no CAD. Hx statin intolerance? -Current treatment: OTC fish oil 800 mg - not taking -Educated on Cholesterol goals;  -Counseled on diet and exercise extensively  Diabetes (A1c goal <7%) -  Controlled - A1c is in pre-diabetic range; pt endorses compliance with current regimen and denies frequent hypoglycemia - pt has hypoglycemic sx when BG is in 70s which happens less than once a month -h/o bariatric surgery 10/2016 -Current home glucose readings fasting glucose: 140 (has a cold today); usually ~120 -Current medications: Basaglar 18 units daily PM (6pm) Metformin ER 500 mg daily Testing supplies -Current meal patterns: no sweets, breads -Current exercise: gym 3x a week -Educated on A1c and blood sugar goals; Prevention and  management of hypoglycemic episodes; Benefits of routine self-monitoring of blood sugar; Sick day management: maintain hydration with high fluid intake and call MD if glucose above 400 -Consider necessity of insulin - pt may be able to control DM with other medications (SGLT2 or GLP-1) that have additional benefits and do not promote hypoglycemia; his insurance plan covers brand-name drugs at low copay (<$10) so cost would not be a barrier - pt deferred changes today but may consider at follow up -Recommended to continue current medication for now  GERD  (Goal: manage symptoms) -Controlled - pt reports he has symptoms without PPI but does not need additional OTC remedies  -Current treatment  Omeprazole 40 mg daily -Recommended to continue current medication  Pain (Goal: manage symptoms) -Controlled - pt reports as long as he is active he does not have pain; he does not use gabapentin often -Current treatment  Gabapentin 100 mg HS - infrequent use -Recommended to continue current medication  Health Maintenance -Vaccine gaps: Prevnar, covid booster, TDAP -Current therapy:  Calcium carbonate 600 mg - 2 daily Multivitamin Seamoss -Patient is satisfied with current therapy and denies issues -Recommended to continue current medication  Patient Goals/Self-Care Activities Patient will:  - take medications as prescribed as evidenced by patient report and record review focus on medication adherence by pill box check glucose daily, document, and provide at future appointments check blood pressure periodically, document, and provide at future appointments      Mr. Breck was given information about Chronic Care Management services today including:  CCM service includes personalized support from designated clinical staff supervised by his physician, including individualized plan of care and coordination with other care providers 24/7 contact phone numbers for assistance for urgent and  routine care needs. Standard insurance, coinsurance, copays and deductibles apply for chronic care management only during months in which we provide at least 20 minutes of these services. Most insurances cover these services at 100%, however patients may be responsible for any copay, coinsurance and/or deductible if applicable. This service may help you avoid the need for more expensive face-to-face services. Only one practitioner may furnish and bill the service in a calendar month. The patient may stop CCM services at any time (effective at the end of the month) by phone call to the office staff.  Patient agreed to services and verbal consent obtained.   Patient verbalizes understanding of instructions provided today and agrees to view in [place].  Telephone follow up appointment with pharmacy team member scheduled for: 6 months  [name], PharmD, [facility] Clinical Pharmacist [facility] [phone]

## 2021-09-14 NOTE — Progress Notes (Signed)
Chronic Care Management Pharmacy Note  09/14/2021 Name:  Philip Ponce MRN:  [id number] DOB:  08/07/1971  Summary: -Pt reports fasting BG typically ~120; he reports infrequent hypoglycemia (less than once a month) -Pt is not checking BP at home, he endorses infrequent dizziness upon standing; he denies swelling in extremities  -Pt has indication for a statin (diabetes) but LDL is 38 without treatment and he declines additional medication  Recommendations/Changes made from today's visit: -None -in future can consider coming off insulin in favor of SGLT-2 or GLP-1 with CV benefit; pt declined med changes today  Plan: -CCM Health Concierge will call patient 3 months for BG check -Pharmacist follow up televisit scheduled for 6 months   Subjective: Philip Ponce is an 50 y.o. year old male who is a primary patient of Bedsole, Amy E, MD.  The CCM team was consulted for assistance with disease management and care coordination needs.    Engaged with patient by telephone for initial visit in response to provider referral for pharmacy case management and/or care coordination services.   Consent to Services:  The patient was given the following information about Chronic Care Management services today, agreed to services, and gave verbal consent: 1. CCM service includes personalized support from designated clinical staff supervised by the primary care provider, including individualized plan of care and coordination with other care providers 2. 24/7 contact phone numbers for assistance for urgent and routine care needs. 3. Service will only be billed when office clinical staff spend 20 minutes or more in a month to coordinate care. 4. Only one practitioner may furnish and bill the service in a calendar month. 5.The patient may stop CCM services at any time (effective at the end of the month) by phone call to the office staff. 6. The patient will be responsible for cost sharing (co-pay) of up  to 20% of the service fee (after annual deductible is met). Patient agreed to services and consent obtained.  Patient Care Team: Excell Seltzer, MD as PCP - General Regan Lemming, MD as PCP - Electrophysiology (Cardiology) End, Cristal Deer, MD as PCP - Cardiology (Cardiology) Delma Freeze, FNP as Nurse Practitioner (Family Medicine) Kathyrn Sheriff, [facility] as Pharmacist (Pharmacist)  Patient lives at home with wife and teenage daughter. He is on disability for cardiomyopathy.  Recent office visits: 08/08/21-PCP-Amy Bedsole,MD-Patient presented for AWV. Labs ordered( diabetes at goal, normal range labs) screenings discussed, vaccinations discussed,flu shot given, referral for Urology (vasctomy evaluation); no med changes.  05/12/21-PCP-Amy Bedsole,MD-Patient presented for left foot pain.Start gabapentin 100 mg at bedtime for likely diabetic neuropathy vs sciatica pain Hold Melatonin,consider checking B12 and Thyroid   05/05/21-PCP-Amy Bedsole,MD-Patient presented for follow up diabetes-A1C worse, track BG's and follow up 2 weeks, may have to change medications.  Recent consult visits: 06/13/21-Optometry-Robert Dickey-eye exam normal. 06/13/21 NP Delicia White (Cardiology VV): f/u recent ECHO. EF 40-50%, continue current mgmt.  03/31/21-Cardiology- no data found   Hospital visits: 6/17/22[facility] ED- Patient presented for lumbar back pain and dark urine concerns. He is positive covid-19.Patient left [facility]  03/10/21-[facility]-Patient presented for nasal congestion,positive covid-19, and dark urine.UA (abnormal)-increase  oral fluid intake with goal of 64-128 ounces of water daily   Objective:  Lab Results  Component Value Date   CREATININE 1.03 08/08/2021   BUN 16 08/08/2021   GFR 83.69 08/08/2021   GFRNONAA >60 02/12/2019   GFRAA >60 02/12/2019   NA 138 08/08/2021   K 4.6 08/08/2021  CALCIUM 9.0 08/08/2021   CO2 30 08/08/2021   GLUCOSE 191 (H)  08/08/2021    Lab Results  Component Value Date/Time   HGBA1C 5.8 08/08/2021 03:05 PM   HGBA1C 8.1 (A) 05/05/2021 09:41 AM   HGBA1C 7.5 (A) 02/02/2021 11:08 AM   HGBA1C 6.4 07/19/2020 08:31 AM   HGBA1C 8.4 (H) 08/04/2012 07:54 AM   GFR 83.69 08/08/2021 03:05 PM   GFR 94.09 07/19/2020 08:31 AM   MICROALBUR 1.6 07/19/2020 08:31 AM   MICROALBUR <0.7 07/23/2019 11:36 AM    Last diabetic Eye exam:  Lab Results  Component Value Date/Time   HMDIABEYEEXA No Retinopathy 03/05/2018 12:00 AM    Last diabetic Foot exam:  Lab Results  Component Value Date/Time   HMDIABFOOTEX done 05/01/2021 12:00 AM     Lab Results  Component Value Date   CHOL 125 08/08/2021   HDL 75.50 08/08/2021   LDLCALC 38 08/08/2021   TRIG 58.0 08/08/2021   CHOLHDL 2 08/08/2021    Hepatic Function Latest Ref Rng & Units 08/08/2021 07/19/2020 07/10/2019  Total Protein 6.0 - 8.3 g/dL 7.5 6.9 7.2  Albumin 3.5 - 5.2 g/dL 4.0 3.8 4.0  AST 0 - 37 U/L $Remo'17 20 15  'fOWbE$ ALT 0 - 53 U/L $Remo'13 18 14  'rdath$ Alk Phosphatase 39 - 117 U/L 59 75 65  Total Bilirubin 0.2 - 1.2 mg/dL 0.4 0.6 0.6  Bilirubin, Direct 0.0 - 0.3 mg/dL - - -    Lab Results  Component Value Date/Time   TSH 0.61 08/08/2021 03:05 PM   TSH 1.019 11/30/2016 08:32 AM   TSH 0.549 08/04/2012 07:54 AM    CBC Latest Ref Rng & Units 12/22/2020 06/22/2020 12/18/2019  WBC 4.0 - 10.5 K/uL 4.1 5.2 4.3  Hemoglobin 13.0 - 17.0 g/dL 11.8(L) 11.4(L) 12.4(L)  Hematocrit 39.0 - 52.0 % 36.2(L) 34.3(L) 38.8(L)  Platelets 150 - 400 K/uL 190 257 217    No results found for: VD25OH  Clinical ASCVD: Yes  The ASCVD Risk score (Arnett DK, et al., 2019) failed to calculate for the following reasons:   The patient has a prior MI or stroke diagnosis    Depression screen [facility] 2/9 07/20/2021 07/19/2020 02/18/2018  Decreased Interest 0 0 0  Down, Depressed, Hopeless 0 0 0  PHQ - 2 Score 0 0 0  Altered sleeping - 0 -  Tired, decreased energy - 0 -  Change in appetite - 0 -  Feeling  bad or failure about yourself  - 0 -  Trouble concentrating - 0 -  Moving slowly or fidgety/restless - 0 -  Suicidal thoughts - 0 -  PHQ-9 Score - 0 -  Difficult doing work/chores - Not difficult at all -  Some recent data might be hidden     Social History   Tobacco Use  Smoking Status Never  Smokeless Tobacco Never   BP Readings from Last 3 Encounters:  08/08/21 118/82  05/12/21 124/86  05/05/21 128/82   Pulse Readings from Last 3 Encounters:  08/08/21 63  05/12/21 87  05/05/21 73   Wt Readings from Last 3 Encounters:  08/08/21 207 lb 8 oz (94.1 kg)  07/20/21 207 lb (93.9 kg)  05/12/21 207 lb 8 oz (94.1 kg)   BMI Readings from Last 3 Encounters:  08/08/21 28.74 kg/m  07/20/21 28.87 kg/m  05/12/21 28.54 kg/m    Assessment/Interventions: Review of patient past medical history, allergies, medications, health status, including review of consultants reports, laboratory and other test data, was performed  as part of comprehensive evaluation and provision of chronic care management services.   SDOH:  (Social Determinants of Health) assessments and interventions performed: Yes  SDOH Screenings   Alcohol Screen: Low Risk    Last Alcohol Screening Score (AUDIT): 0  Depression (PHQ2-9): Low Risk    PHQ-2 Score: 0  Financial Resource Strain: Low Risk    Difficulty of Paying Living Expenses: Not hard at all  Food Insecurity: No Food Insecurity   Worried About Charity fundraiser in the Last Year: Never true   Ran Out of Food in the Last Year: Never true  Housing: Low Risk    Last Housing Risk Score: 0  Physical Activity: Insufficiently Active   Days of Exercise per Week: 3 days   Minutes of Exercise per Session: 30 min  Social Connections: Socially Integrated   Frequency of Communication with Friends and Family: More than three times a week   Frequency of Social Gatherings with Friends and Family: Three times a week   Attends Religious Services: More than 4 times per  year   Active Member of Clubs or Organizations: Yes   Attends Archivist Meetings: Never   Marital Status: Married  Stress: No Stress Concern Present   Feeling of Stress : Not at all  Tobacco Use: Low Risk    Smoking Tobacco Use: Never   Smokeless Tobacco Use: Never   Passive Exposure: Not on file  Transportation Needs: No Transportation Needs   Lack of Transportation (Medical): No   Lack of Transportation (Non-Medical): No    CCM Care Plan  Allergies  Allergen Reactions   Mobic [Meloxicam] Other (See Comments)    Ulcers in stomach eruption    Tramadol     Hard to breath   Diclofenac Palpitations and Other (See Comments)    Fast heart beat, CP,SOB and weakness on one side.    Medications Reviewed Today     Reviewed by Charlton Haws, [facility] (Pharmacist) on 09/14/21 at [phone]  Med List Status: <None>   Medication Order Taking? Sig Documenting Provider Last Dose Status Informant  Blood Glucose Monitoring Suppl (ONE TOUCH ULTRA 2) w/Device KIT [id number] Yes Use to check blood sugar up to 2 times a day Bedsole, Amy E, MD Taking Active   calcium carbonate (OS-CAL) 600 MG TABS tablet [id number] Yes Take 1,200 mg by mouth daily with breakfast.  [provider] Taking Active Self  carvedilol (COREG) 12.5 MG tablet [id number] Yes TAKE 1 TABLET BY MOUTH TWICE DAILY Alisa Graff, FNP Taking Active   digoxin (LANOXIN) 0.125 MG tablet [id number] Yes Take 1 tablet (125 mcg total) by mouth daily. [name], MD Taking Active   gabapentin (NEURONTIN) 100 MG capsule [id number] Yes Take 1 capsule (100 mg total) by mouth at bedtime. Bedsole, Amy E, MD Taking Active   glucose blood (ONE TOUCH ULTRA TEST) test strip [id number] Yes Check blood sugar up to 2 times a day and as instructed. Dx E11.9 [name], MD Taking Active   Insulin Glargine (BASAGLAR KWIKPEN) 100 UNIT/ML [id number] Yes INJECT SUBCUTANEOUSLY 18   UNITS (0.18ML) DAILY Bedsole, Amy E, MD Taking Active    Insulin Pen Needle (B-D ULTRAFINE III SHORT PEN) 31G X 8 MM MISC [id number] Yes Use to inject insulin daily [name], Amy E, MD Taking Active   Lancets [facility] ULTRASOFT) lancets [id number] Yes Use to check blood sugar up to 2 times a day. [name], MD Taking Active   metFORMIN (  GLUCOPHAGE-XR) 500 MG 24 hr tablet [id number] Yes Take 1 tablet (500 mg total) by mouth daily with breakfast. [name], Amy E, MD Taking Active   Multiple Vitamins-Minerals (ADULT GUMMY PO) [id number] Yes Take 2 tablets by mouth daily. [provider] Taking Active Self  omeprazole (PRILOSEC) 40 MG capsule [id number] Yes TAKE 1 CAPSULE DAILY AS    NEEDED FOR Dia Sitter, MD Taking Active   OVER THE COUNTER MEDICATION [id number] Yes SEA MOSS SUPPLEMENT [provider] Taking Active   sacubitril-valsartan (ENTRESTO) 97-103 MG [id number] Yes Take 1 tablet by mouth 2 (two) times daily. [name], MD Taking Active   spironolactone (ALDACTONE) 25 MG tablet [id number] Yes Take 1 tablet (25 mg total) by mouth daily. [name], MD Taking Active             Patient Active Problem List   Diagnosis Date Noted   Left foot pain 05/12/2021   Numbness of left foot 05/12/2021   COVID-19 virus infection 03/08/2021   Bilateral chronic knee pain 05/30/2018   Status post biventricular pacemaker 10/09/2017   S/P left rotator cuff repair 10/04/2017   Cervical radiculopathy 07/24/2017   Rotator cuff tendonitis, left 05/06/2017   Chronic combined systolic and diastolic heart failure ([place]) 12/18/2016   Non-ischemic cardiomyopathy ([place]) 12/04/2016   Iron deficiency anemia    History of bariatric surgery 11/20/2016   Toenail fungus 10/14/2015   RBBB 04/07/2015   LBBB (left bundle branch block) 09/24/2013   Obstructive apnea 09/24/2013   GERD 09/29/2010   ERECTILE DYSFUNCTION, 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   Hypertension associated with diabetes ([place]) 07/09/2007   Hyperlipidemia associated with type  2 diabetes mellitus ([place]) 06/12/2007   Type 2 diabetes mellitus without complication, with long-term current use of insulin ([place]) 06/04/2007    Immunization History  Administered Date(s) Administered   Influenza, Seasonal, Injecte, Preservative Fre 10/07/2012, 09/28/2013, 10/19/2016   Influenza,inj,Quad PF,6+ Mos 07/19/2015, 06/27/2017, 07/15/2018, 07/23/2019, 07/26/2020, 08/08/2021   Influenza-Unspecified 07/25/2016   PFIZER(Purple Top)SARS-COV-2 Vaccination 12/17/2019, 01/11/2020, 06/28/2020   Pneumococcal Polysaccharide-23 10/07/2012, 12/24/2017   Tdap 02/27/2011   Zoster Recombinat (Shingrix) 09/23/2020, 03/31/2021    Conditions to be addressed/monitored:  Hypertension, Hyperlipidemia, Diabetes, Heart Failure, and GERD  Care Plan : [place]  Updates made by Charlton Haws, [place] since 09/14/2021 12:00 AM     Problem: Hypertension, Hyperlipidemia, Diabetes, Heart Failure, and GERD   Priority: High     Long-Range Goal: Disease mgmt   Start Date: 09/14/2021  Expected End Date: 09/14/2022  This Visit's Progress: On track  Priority: High  Note:   Current Barriers:  Unable to independently monitor therapeutic efficacy  Pharmacist Clinical Goal(s):  Patient will achieve adherence to monitoring guidelines and medication adherence to achieve therapeutic efficacy through collaboration with PharmD and provider.   Interventions: 1:1 collaboration with [name], MD regarding development and update of comprehensive plan of care as evidenced by provider attestation and co-signature Inter-disciplinary care team collaboration (see longitudinal plan of care) Comprehensive medication review performed; medication list updated in electronic medical record  Hypertension / Heart Failure (BP goal <130/80) -Controlled -Last ejection fraction: 40-50% (Date: 03/07/21) (improved from 30-35%) -HF type: Combined Systolic and Diastolic; NYHA Class: II (slight limitation of  activity) -s/p BIV ICD -Current home BP readings: not checking; Denies hypotensive/hypertensive symptoms including dizziness -Current treatment: Carvedilol 12.5 mg BID Digoxin 0.125 mg daily Entresto 97-103 mg BID Spironolactone 25 mg daily -Medications previously tried: furosemide, isosorbide  -Educated on BP  goals and benefits of medications for prevention of heart attack, stroke and kidney damage;Exercise goal of 150 minutes per week; -Educated on Benefits of medications for managing symptoms and prolonging life -Counseled to monitor BP at home periodically -Recommended to continue current medication  Hyperlipidemia: (LDL goal < 70) -Controlled - LDL is 38 without medication; pt has indication for statin (diabetes) but defers initiation -cath 11/2016 - no CAD. Hx statin intolerance? -Current treatment: OTC fish oil 800 mg - not taking -Educated on Cholesterol goals;  -Counseled on diet and exercise extensively  Diabetes (A1c goal <7%) -Controlled - A1c is in pre-diabetic range; pt endorses compliance with current regimen and denies frequent hypoglycemia - pt has hypoglycemic sx when BG is in 70s which happens less than once a month -h/o bariatric surgery 10/2016 -Current home glucose readings fasting glucose: 140 (has a cold today); usually ~120 -Current medications: Basaglar 18 units daily PM (6pm) Metformin ER 500 mg daily Testing supplies -Current meal patterns: no sweets, breads -Current exercise: gym 3x a week -Educated on A1c and blood sugar goals; Prevention and management of hypoglycemic episodes; Benefits of routine self-monitoring of blood sugar; Sick day management: maintain hydration with high fluid intake and call MD if glucose above 400 -Consider necessity of insulin - pt may be able to control DM with other medications (SGLT2 or GLP-1) that have additional benefits and do not promote hypoglycemia; his insurance plan covers brand-name drugs at low copay (<$10) so cost  would not be a barrier - pt deferred changes today but may consider at follow up -Recommended to continue current medication for now  GERD  (Goal: manage symptoms) -Controlled - pt reports he has symptoms without PPI but does not need additional OTC remedies  -Current treatment  Omeprazole 40 mg daily -Recommended to continue current medication  Pain (Goal: manage symptoms) -Controlled - pt reports as long as he is active he does not have pain; he does not use gabapentin often -Current treatment  Gabapentin 100 mg HS - infrequent use -Recommended to continue current medication  Health Maintenance -Vaccine gaps: Prevnar, covid booster, TDAP -Current therapy:  Calcium carbonate 600 mg - 2 daily Multivitamin [place] -Patient is satisfied with current therapy and denies issues -Recommended to continue current medication  Patient Goals/Self-Care Activities Patient will:  - take medications as prescribed as evidenced by patient report and record review focus on medication adherence by pill box check glucose daily, document, and provide at future appointments check blood pressure periodically, document, and provide at future appointments      Medication Assistance: None required.  Patient affirms current coverage meets needs. - pt has Medicare/Medicaid.   Compliance/Adherence/Medication fill history: Care Gaps: Eye exam (due 03/06/19)  Star-Rating Drugs: Metformin - LF 07/02/21 x 90 ds; [place] 100%  Patient's preferred pharmacy is:  [facility] [place], [place] [facility] [address] [address] Phone: [phone] Fax: [phone]  [address] [place] [address] Phone: [phone] Fax: [phone]  Uses pill box? Yes Pt endorses 100% compliance  We discussed: Current pharmacy is preferred with insurance plan and patient is  satisfied with pharmacy services Patient decided to: Continue current medication management strategy  Care Plan and Follow Up Patient Decision:  Patient agrees to Care Plan and Follow-up.  Plan: Telephone follow up appointment with care management team member scheduled for:  6 months  [name], PharmD, [facility]  Clinical Pharmacist [facility] [phone]

## 2021-09-23 DIAGNOSIS — E1159 Type 2 diabetes mellitus with other circulatory complications: Secondary | ICD-10-CM

## 2021-09-23 DIAGNOSIS — I5042 Chronic combined systolic (congestive) and diastolic (congestive) heart failure: Secondary | ICD-10-CM

## 2021-09-23 DIAGNOSIS — Z7984 Long term (current) use of oral hypoglycemic drugs: Secondary | ICD-10-CM

## 2021-09-23 DIAGNOSIS — I110 Hypertensive heart disease with heart failure: Secondary | ICD-10-CM | POA: Diagnosis not present

## 2021-09-23 DIAGNOSIS — Z794 Long term (current) use of insulin: Secondary | ICD-10-CM | POA: Diagnosis not present

## 2021-09-23 DIAGNOSIS — E785 Hyperlipidemia, unspecified: Secondary | ICD-10-CM | POA: Diagnosis not present

## 2021-09-29 ENCOUNTER — Telehealth: Payer: Self-pay

## 2021-09-29 ENCOUNTER — Ambulatory Visit (INDEPENDENT_AMBULATORY_CARE_PROVIDER_SITE_OTHER): Payer: Medicare HMO

## 2021-09-29 DIAGNOSIS — Z794 Long term (current) use of insulin: Secondary | ICD-10-CM

## 2021-09-29 DIAGNOSIS — I428 Other cardiomyopathies: Secondary | ICD-10-CM | POA: Diagnosis not present

## 2021-09-29 DIAGNOSIS — E119 Type 2 diabetes mellitus without complications: Secondary | ICD-10-CM

## 2021-09-29 LAB — CUP PACEART REMOTE DEVICE CHECK
Battery Remaining Longevity: 27 mo
Battery Voltage: 2.94 V
Brady Statistic AP VP Percent: 0 %
Brady Statistic AP VS Percent: 0 %
Brady Statistic AS VP Percent: 98.79 %
Brady Statistic AS VS Percent: 1.21 %
Brady Statistic RA Percent Paced: 0 %
Brady Statistic RV Percent Paced: 83.58 %
Date Time Interrogation Session: 20230106033522
HighPow Impedance: 48 Ohm
Implantable Lead Implant Date: 20181003
Implantable Lead Implant Date: 20181003
Implantable Lead Implant Date: 20181003
Implantable Lead Location: 753858
Implantable Lead Location: 753859
Implantable Lead Location: 753860
Implantable Lead Model: 4398
Implantable Lead Model: 5076
Implantable Pulse Generator Implant Date: 20181003
Lead Channel Impedance Value: 166.25 Ohm
Lead Channel Impedance Value: 166.25 Ohm
Lead Channel Impedance Value: 166.25 Ohm
Lead Channel Impedance Value: 199.5 Ohm
Lead Channel Impedance Value: 199.5 Ohm
Lead Channel Impedance Value: 285 Ohm
Lead Channel Impedance Value: 285 Ohm
Lead Channel Impedance Value: 304 Ohm
Lead Channel Impedance Value: 399 Ohm
Lead Channel Impedance Value: 399 Ohm
Lead Channel Impedance Value: 399 Ohm
Lead Channel Impedance Value: 399 Ohm
Lead Channel Impedance Value: 475 Ohm
Lead Channel Impedance Value: 589 Ohm
Lead Channel Impedance Value: 589 Ohm
Lead Channel Impedance Value: 589 Ohm
Lead Channel Impedance Value: 646 Ohm
Lead Channel Impedance Value: 646 Ohm
Lead Channel Pacing Threshold Amplitude: 0.625 V
Lead Channel Pacing Threshold Amplitude: 0.75 V
Lead Channel Pacing Threshold Amplitude: 1.25 V
Lead Channel Pacing Threshold Pulse Width: 0.4 ms
Lead Channel Pacing Threshold Pulse Width: 0.4 ms
Lead Channel Pacing Threshold Pulse Width: 0.4 ms
Lead Channel Sensing Intrinsic Amplitude: 2 mV
Lead Channel Sensing Intrinsic Amplitude: 2 mV
Lead Channel Sensing Intrinsic Amplitude: 6.5 mV
Lead Channel Sensing Intrinsic Amplitude: 6.5 mV
Lead Channel Setting Pacing Amplitude: 1.5 V
Lead Channel Setting Pacing Amplitude: 2 V
Lead Channel Setting Pacing Amplitude: 2.5 V
Lead Channel Setting Pacing Pulse Width: 0.4 ms
Lead Channel Setting Pacing Pulse Width: 0.4 ms
Lead Channel Setting Sensing Sensitivity: 0.3 mV

## 2021-09-29 MED ORDER — BD PEN NEEDLE SHORT U/F 31G X 8 MM MISC: 3 refills | Status: DC

## 2021-09-29 MED ORDER — BASAGLAR KWIKPEN 100 UNIT/ML ~~LOC~~ SOPN: PEN_INJECTOR | SUBCUTANEOUS | 0 refills | Status: DC

## 2021-09-29 NOTE — Addendum Note (Signed)
Addended by: Carter Kitten on: 09/29/2021 12:31 PM   Modules accepted: Orders

## 2021-09-29 NOTE — Telephone Encounter (Signed)
[place] Night - Client Nonclinical Telephone Record  [facility] Client [facility] Night - Client Client Site [place] - Night Provider Eliezer Lofts - MD Contact Type Call Who Is Calling Patient / Member / Family / Caregiver Caller Name Abdulrahman Bracey Caller Phone Number [phone] Patient Name Philip Ponce Patient DOB 11/07/1970 Call Type Message Only Information Provided Reason for Call Request for General Office Information Initial Comment Caller states he needs a refill for his Insulin pen. He needs it for tomorrow. His shipment has been delayed. He needs 1 pen sent to a local pharmacy. Pharmacy is [facility] in [place] on [address]. Additional Comment Provided office hours. Disp. Time Disposition Final User 09/28/2021 7:45:38 PM General Information Provided Yes Rica Mote Call Closed By: Rica Mote Transaction Date/Time: 09/28/2021 7:42:07 PM (ET

## 2021-09-29 NOTE — Telephone Encounter (Signed)
Refill sent as requested. 

## 2021-10-03 DIAGNOSIS — K219 Gastro-esophageal reflux disease without esophagitis: Secondary | ICD-10-CM | POA: Diagnosis not present

## 2021-10-03 DIAGNOSIS — I429 Cardiomyopathy, unspecified: Secondary | ICD-10-CM | POA: Diagnosis not present

## 2021-10-03 DIAGNOSIS — I509 Heart failure, unspecified: Secondary | ICD-10-CM | POA: Diagnosis not present

## 2021-10-03 DIAGNOSIS — Z803 Family history of malignant neoplasm of breast: Secondary | ICD-10-CM | POA: Diagnosis not present

## 2021-10-03 DIAGNOSIS — E1151 Type 2 diabetes mellitus with diabetic peripheral angiopathy without gangrene: Secondary | ICD-10-CM | POA: Diagnosis not present

## 2021-10-03 DIAGNOSIS — Z7984 Long term (current) use of oral hypoglycemic drugs: Secondary | ICD-10-CM | POA: Diagnosis not present

## 2021-10-03 DIAGNOSIS — I252 Old myocardial infarction: Secondary | ICD-10-CM | POA: Diagnosis not present

## 2021-10-03 DIAGNOSIS — E261 Secondary hyperaldosteronism: Secondary | ICD-10-CM | POA: Diagnosis not present

## 2021-10-03 DIAGNOSIS — I110 Hypertensive heart disease with heart failure: Secondary | ICD-10-CM | POA: Diagnosis not present

## 2021-10-03 DIAGNOSIS — R32 Unspecified urinary incontinence: Secondary | ICD-10-CM | POA: Diagnosis not present

## 2021-10-03 DIAGNOSIS — M1990 Unspecified osteoarthritis, unspecified site: Secondary | ICD-10-CM | POA: Diagnosis not present

## 2021-10-03 DIAGNOSIS — I2510 Atherosclerotic heart disease of native coronary artery without angina pectoris: Secondary | ICD-10-CM | POA: Diagnosis not present

## 2021-10-08 ENCOUNTER — Other Ambulatory Visit: Payer: Self-pay | Admitting: Family Medicine

## 2021-10-08 DIAGNOSIS — E119 Type 2 diabetes mellitus without complications: Secondary | ICD-10-CM

## 2021-10-08 DIAGNOSIS — Z794 Long term (current) use of insulin: Secondary | ICD-10-CM

## 2021-10-10 NOTE — Progress Notes (Signed)
Remote ICD transmission.   

## 2021-10-11 ENCOUNTER — Telehealth: Payer: Self-pay | Admitting: Internal Medicine

## 2021-10-11 NOTE — Telephone Encounter (Signed)
° °  Pre-operative Risk Assessment    Patient Name: Philip Ponce  DOB: [birth year] MRN: [id number]      Request for Surgical Clearance    Procedure:   Vasectomy   Date of Surgery:  Clearance TBD                                 Surgeon:  Dr. Harold Barban  Surgeon's Group or Practice Name:  [facility]  Phone number:  [phone] Fax number:  [phone]   Type of Clearance Requested:   - Medical    Type of Anesthesia:  General    Additional requests/questions:   No  Signed, Trilby Drummer   10/11/2021, 3:22 PM

## 2021-10-12 NOTE — Telephone Encounter (Signed)
Will fax these notes to requesting office to please see notes from Almyra Deforest, [facility] pre op provider today.

## 2021-10-12 NOTE — Telephone Encounter (Signed)
Although the patient's electrophysiologist is Dr. Curt Bears, his general cardiologist appears to be Dr. Clayborn Bigness of [facility] in [place]. Will defer cardiac clearance to general cardiologist.

## 2021-10-13 ENCOUNTER — Other Ambulatory Visit
Admission: RE | Admit: 2021-10-13 | Discharge: 2021-10-13 | Disposition: A | Discharge status: Home / Self Care | Payer: Medicare HMO | Source: Ambulatory Visit | Attending: Student | Admitting: Student

## 2021-10-13 DIAGNOSIS — Z95810 Presence of automatic (implantable) cardiac defibrillator: Secondary | ICD-10-CM | POA: Diagnosis not present

## 2021-10-13 DIAGNOSIS — E119 Type 2 diabetes mellitus without complications: Secondary | ICD-10-CM | POA: Diagnosis not present

## 2021-10-13 DIAGNOSIS — I5020 Unspecified systolic (congestive) heart failure: Secondary | ICD-10-CM | POA: Insufficient documentation

## 2021-10-13 DIAGNOSIS — E7800 Pure hypercholesterolemia, unspecified: Secondary | ICD-10-CM | POA: Diagnosis not present

## 2021-10-13 DIAGNOSIS — I428 Other cardiomyopathies: Secondary | ICD-10-CM | POA: Diagnosis not present

## 2021-10-13 DIAGNOSIS — Z794 Long term (current) use of insulin: Secondary | ICD-10-CM | POA: Diagnosis not present

## 2021-10-13 DIAGNOSIS — I10 Essential (primary) hypertension: Secondary | ICD-10-CM | POA: Diagnosis not present

## 2021-10-13 DIAGNOSIS — G4733 Obstructive sleep apnea (adult) (pediatric): Secondary | ICD-10-CM | POA: Diagnosis not present

## 2021-10-13 LAB — DIGOXIN LEVEL: Digoxin Level: 0.5 ng/mL — ABNORMAL LOW (ref 0.8–2.0)

## 2021-10-18 ENCOUNTER — Other Ambulatory Visit: Payer: Self-pay | Admitting: Urology

## 2021-10-24 ENCOUNTER — Ambulatory Visit: Admit: 2021-10-24 | Payer: Medicare HMO | Admitting: Urology

## 2021-10-24 SURGERY — VASECTOMY
Anesthesia: General

## 2021-12-29 ENCOUNTER — Ambulatory Visit (INDEPENDENT_AMBULATORY_CARE_PROVIDER_SITE_OTHER): Payer: Medicare HMO

## 2021-12-29 DIAGNOSIS — I428 Other cardiomyopathies: Secondary | ICD-10-CM

## 2021-12-30 LAB — CUP PACEART REMOTE DEVICE CHECK
Battery Remaining Longevity: 25 mo
Battery Voltage: 2.94 V
Brady Statistic AP VP Percent: 0 %
Brady Statistic AP VS Percent: 0 %
Brady Statistic AS VP Percent: 98.79 %
Brady Statistic AS VS Percent: 1.2 %
Brady Statistic RA Percent Paced: 0.01 %
Brady Statistic RV Percent Paced: 82.16 %
Date Time Interrogation Session: 20230407033424
HighPow Impedance: 56 Ohm
Implantable Lead Implant Date: 20181003
Implantable Lead Implant Date: 20181003
Implantable Lead Implant Date: 20181003
Implantable Lead Location: 753858
Implantable Lead Location: 753859
Implantable Lead Location: 753860
Implantable Lead Model: 4398
Implantable Lead Model: 5076
Implantable Pulse Generator Implant Date: 20181003
Lead Channel Impedance Value: 172.541
Lead Channel Impedance Value: 172.541
Lead Channel Impedance Value: 172.541
Lead Channel Impedance Value: 199.5 Ohm
Lead Channel Impedance Value: 199.5 Ohm
Lead Channel Impedance Value: 285 Ohm
Lead Channel Impedance Value: 304 Ohm
Lead Channel Impedance Value: 342 Ohm
Lead Channel Impedance Value: 399 Ohm
Lead Channel Impedance Value: 399 Ohm
Lead Channel Impedance Value: 399 Ohm
Lead Channel Impedance Value: 418 Ohm
Lead Channel Impedance Value: 532 Ohm
Lead Channel Impedance Value: 608 Ohm
Lead Channel Impedance Value: 608 Ohm
Lead Channel Impedance Value: 608 Ohm
Lead Channel Impedance Value: 665 Ohm
Lead Channel Impedance Value: 703 Ohm
Lead Channel Pacing Threshold Amplitude: 0.625 V
Lead Channel Pacing Threshold Amplitude: 0.75 V
Lead Channel Pacing Threshold Amplitude: 1.25 V
Lead Channel Pacing Threshold Pulse Width: 0.4 ms
Lead Channel Pacing Threshold Pulse Width: 0.4 ms
Lead Channel Pacing Threshold Pulse Width: 0.4 ms
Lead Channel Sensing Intrinsic Amplitude: 2.25 mV
Lead Channel Sensing Intrinsic Amplitude: 2.25 mV
Lead Channel Sensing Intrinsic Amplitude: 7.75 mV
Lead Channel Sensing Intrinsic Amplitude: 7.75 mV
Lead Channel Setting Pacing Amplitude: 1.5 V
Lead Channel Setting Pacing Amplitude: 2 V
Lead Channel Setting Pacing Amplitude: 2.5 V
Lead Channel Setting Pacing Pulse Width: 0.4 ms
Lead Channel Setting Pacing Pulse Width: 0.4 ms
Lead Channel Setting Sensing Sensitivity: 0.3 mV

## 2022-01-16 NOTE — Progress Notes (Signed)
Remote ICD transmission.   

## 2022-02-06 ENCOUNTER — Ambulatory Visit (INDEPENDENT_AMBULATORY_CARE_PROVIDER_SITE_OTHER): Payer: Medicare HMO | Admitting: Family Medicine

## 2022-02-06 ENCOUNTER — Encounter: Payer: Self-pay | Admitting: Family Medicine

## 2022-02-06 VITALS — BP 110/70 | HR 67 | Temp 98.3°F | Ht 71.25 in | Wt 213.2 lb

## 2022-02-06 DIAGNOSIS — Z794 Long term (current) use of insulin: Secondary | ICD-10-CM

## 2022-02-06 DIAGNOSIS — E119 Type 2 diabetes mellitus without complications: Secondary | ICD-10-CM | POA: Diagnosis not present

## 2022-02-06 DIAGNOSIS — S63601A Unspecified sprain of right thumb, initial encounter: Secondary | ICD-10-CM

## 2022-02-06 LAB — POCT GLYCOSYLATED HEMOGLOBIN (HGB A1C): Hemoglobin A1C: 8.3 % — AB (ref 4.0–5.6)

## 2022-02-06 NOTE — Progress Notes (Signed)
Patient ID: Philip Ponce, male    DOB: 08-Jun-1971, 51 y.o.   MRN: [id number]  This visit was conducted in person.  BP 110/70   Pulse 67   Temp 98.3 F (36.8 C) (Oral)   Ht 5' 11.25" (1.81 m)   Wt 213 lb 4 oz (96.7 kg)   SpO2 97%   BMI 29.53 kg/m    CC:  Chief Complaint  Patient presents with   Diabetes   Hand Pain    Right Thumb     Subjective:   HPI: Philip Ponce is a 51 y.o. male presenting on 02/06/2022 for Diabetes and Hand Pain (Right Thumb/)  Diabetes:   poor control worsened on same regimen as 6 months ago when A1c was 5.8. On Basaglar 18 units daily and metformin XR 500 mg p.o. daily  Lab Results  Component Value Date   HGBA1C 8.3 (A) 02/06/2022  Using medications without difficulties: none He states he has been under more stress, eating more carbohydrates Hx of bariatric surgery. Hypoglycemic episodes:? Hyperglycemic episodes:? Feet problems: no ulcers Blood Sugars averaging: not checking... forgets, would do better with continuous monitor. eye exam within last year: due  He has also noticed new onset right thumb pain in the last  weekend He DJs.. following  moving equipment started pain in  thumb, no redness, just soreness.  No swelling.  Pain with abduction, and palpation.     Wt Readings from Last 3 Encounters:  02/06/22 213 lb 4 oz (96.7 kg)  08/08/21 207 lb 8 oz (94.1 kg)  07/20/21 207 lb (93.9 kg)  Body mass index is 29.53 kg/m.   Relevant past medical, surgical, family and social history reviewed and updated as indicated. Interim medical history since our last visit reviewed. Allergies and medications reviewed and updated. Outpatient Medications Prior to Visit  Medication Sig Dispense Refill   Blood Glucose Monitoring Suppl (ONE TOUCH ULTRA 2) w/Device KIT Use to check blood sugar up to 2 times a day 1 kit 0   calcium carbonate (OS-CAL) 600 MG TABS tablet Take 1,200 mg by mouth daily with breakfast.      carvedilol (COREG)  12.5 MG tablet TAKE 1 TABLET BY MOUTH TWICE DAILY 180 tablet 3   digoxin (LANOXIN) 0.125 MG tablet Take 1 tablet (125 mcg total) by mouth daily. 30 tablet 3   gabapentin (NEURONTIN) 100 MG capsule Take 1 capsule (100 mg total) by mouth at bedtime. 30 capsule 1   glucose blood (ONE TOUCH ULTRA TEST) test strip Check blood sugar up to 2 times a day and as instructed. Dx E11.9 100 each 5   Insulin Glargine (BASAGLAR KWIKPEN) 100 UNIT/ML INJECT 18 UNITS            SUBCUTANEOUSLY DAILY 15 mL 1   Insulin Pen Needle (B-D ULTRAFINE III SHORT PEN) 31G X 8 MM MISC Use to inject insulin daily 90 each 3   Lancets (ONETOUCH ULTRASOFT) lancets Use to check blood sugar up to 2 times a day. 100 each 5   metFORMIN (GLUCOPHAGE-XR) 500 MG 24 hr tablet Take 1 tablet (500 mg total) by mouth daily with breakfast. 90 tablet 3   Multiple Vitamins-Minerals (ADULT GUMMY PO) Take 2 tablets by mouth daily.     omeprazole (PRILOSEC) 40 MG capsule Take 40 mg by mouth daily.     OVER THE COUNTER MEDICATION SEA MOSS SUPPLEMENT     sacubitril-valsartan (ENTRESTO) 97-103 MG Take 1 tablet by mouth 2 (two) times  daily. 180 tablet 3   spironolactone (ALDACTONE) 25 MG tablet Take 1 tablet (25 mg total) by mouth daily. 90 tablet 3   omeprazole (PRILOSEC) 40 MG capsule TAKE 1 CAPSULE DAILY AS    NEEDED FOR INDIGESTION (Patient taking differently: Take 40 mg by mouth daily.) 90 capsule 3   No facility-administered medications prior to visit.     Per HPI unless specifically indicated in ROS section below Review of Systems  Constitutional:  Negative for fatigue and fever.  HENT:  Negative for ear pain.   Eyes:  Negative for pain.  Respiratory:  Negative for cough and shortness of breath.   Cardiovascular:  Negative for chest pain, palpitations and leg swelling.  Gastrointestinal:  Negative for abdominal pain.  Genitourinary:  Negative for dysuria.  Musculoskeletal:  Negative for arthralgias.  Neurological:  Negative for syncope,  light-headedness and headaches.  Psychiatric/Behavioral:  Negative for dysphoric mood.    Objective:  BP 110/70   Pulse 67   Temp 98.3 F (36.8 C) (Oral)   Ht 5' 11.25" (1.81 m)   Wt 213 lb 4 oz (96.7 kg)   SpO2 97%   BMI 29.53 kg/m   Wt Readings from Last 3 Encounters:  02/06/22 213 lb 4 oz (96.7 kg)  08/08/21 207 lb 8 oz (94.1 kg)  07/20/21 207 lb (93.9 kg)      Physical Exam Constitutional:      Appearance: He is well-developed.  HENT:     Head: Normocephalic.     Right Ear: Hearing normal.     Left Ear: Hearing normal.     Nose: Nose normal.  Neck:     Thyroid: No thyroid mass or thyromegaly.     Vascular: No carotid bruit.     Trachea: Trachea normal.  Cardiovascular:     Rate and Rhythm: Normal rate and regular rhythm.     Pulses: Normal pulses.     Heart sounds: Heart sounds not distant. No murmur heard.    No friction rub. No gallop.     Comments: No peripheral edema Pulmonary:     Effort: Pulmonary effort is normal. No respiratory distress.     Breath sounds: Normal breath sounds.  Skin:    General: Skin is warm and dry.     Findings: No rash.  Psychiatric:        Speech: Speech normal.        Behavior: Behavior normal.        Thought Content: Thought content normal.       Results for orders placed or performed in visit on 02/06/22  POCT glycosylated hemoglobin (Hb A1C)  Result Value Ref Range   Hemoglobin A1C 8.3 (A) 4.0 - 5.6 %   HbA1c POC (<> result, manual entry)     HbA1c, POC (prediabetic range)     HbA1c, POC (controlled diabetic range)       COVID 19 screen:  No recent travel or known exposure to COVID19 The patient denies respiratory symptoms of COVID 19 at this time. The importance of social distancing was discussed today.   Assessment and Plan    Problem List Items Addressed This Visit     Type 2 diabetes mellitus without complication, with long-term current use of insulin ([place]) - Primary (Chronic)    Work on low carb diet,  continue exercise.  Work on weight loss.  We will set up referral with pharmacist to see if we can get the  continuous glucose monitor set up.  Continue metformin and Balsalglar for now.  If blood sugar is not improving we can consider Jardiance addition  You can discuss addition of Jardiance with cardiologist . Set up yearly eye exam for diabetes and have the opthalmologist send [place] a copy of the evaluation for the chart.      Relevant Orders   POCT glycosylated hemoglobin (Hb A1C) (Completed)   AMB Referral to [facility] Coordinaton   Sprain of right thumb    Acute, Ice and elevate left hand for thumb sprain. You can use OTC voltaren gel/cream 4 times daily for pain and inflammation.       Orders Placed This Encounter  Procedures   AMB Referral to [facility] Coordin[facility]    Referral Priority:   Routine    Referral Type:   Consultation    Referral Reason:   Care Coordination    Number of Visits Requested:   1   POCT glycosylated hemoglobin (Hb A1C)   [facility] DIABETES EYE EXAM    This external order was created through the Results Console.     Eliezer Lofts, MD

## 2022-02-06 NOTE — Patient Instructions (Addendum)
Work on low [employer], continue exercise. ? Work on weight loss. ? We will set up referral with pharmacist to see if we can get the  continuous glucose monitor set up. ? Continue metformin and Balsalglar for now. ? If blood sugar is not improving we can consider Jardiance addition ? You can discuss addition of Jardiance with cardiologist . ?Set up yearly eye exam for diabetes and have the opthalmologist send [place] a copy of the evaluation for the chart. ? Ice and elevate left hand for thumb sprain. You can use OTC voltaren gel/cream 4 times daily for pain and inflammation. ?

## 2022-02-06 NOTE — Progress Notes (Signed)
poc

## 2022-02-07 ENCOUNTER — Telehealth: Payer: Self-pay

## 2022-02-07 NOTE — Progress Notes (Signed)
    Chronic Care Management Pharmacy Assistant   Name: Philip Ponce  MRN: [id number] DOB: 09/01/1971  Reason for Encounter: CCM (Reschedule appointment)   Called patient to reschedule his 03/15/22 telephone appointment with [name]. Per Mendel Ryder she would like to see patient sooner than the original date. No answer; left message.  [name], CPP notified  Marijean Niemann, [place] Clinical Pharmacy Assistant [phone]

## 2022-02-08 NOTE — Progress Notes (Signed)
Reason for Encounter: CCM (Reschedule appointment)   Called patient to reschedule his 03/15/22 telephone appointment with [name]. Per Mendel Ryder she would like to see patient sooner than the original date. No answer; left message.   [name], CPP notified   Marijean Niemann, [place] Clinical Pharmacy Assistant [phone]

## 2022-02-09 ENCOUNTER — Telehealth: Payer: Self-pay | Admitting: Family Medicine

## 2022-02-09 DIAGNOSIS — Z95810 Presence of automatic (implantable) cardiac defibrillator: Secondary | ICD-10-CM | POA: Diagnosis not present

## 2022-02-09 DIAGNOSIS — I428 Other cardiomyopathies: Secondary | ICD-10-CM | POA: Diagnosis not present

## 2022-02-09 DIAGNOSIS — E7800 Pure hypercholesterolemia, unspecified: Secondary | ICD-10-CM | POA: Diagnosis not present

## 2022-02-09 DIAGNOSIS — E119 Type 2 diabetes mellitus without complications: Secondary | ICD-10-CM | POA: Diagnosis not present

## 2022-02-09 DIAGNOSIS — G4733 Obstructive sleep apnea (adult) (pediatric): Secondary | ICD-10-CM | POA: Diagnosis not present

## 2022-02-09 DIAGNOSIS — I10 Essential (primary) hypertension: Secondary | ICD-10-CM | POA: Diagnosis not present

## 2022-02-09 DIAGNOSIS — I5020 Unspecified systolic (congestive) heart failure: Secondary | ICD-10-CM | POA: Diagnosis not present

## 2022-02-09 DIAGNOSIS — Z794 Long term (current) use of insulin: Secondary | ICD-10-CM | POA: Diagnosis not present

## 2022-02-09 NOTE — Telephone Encounter (Signed)
Reviewed copy of what Philip Ponce dropped off from [employer].  He actually dropped off his prescriptions for his glasses and contact lens.  Patient notified of this via telephone.  He states we can just shred because he has the originals.

## 2022-02-09 NOTE — Telephone Encounter (Signed)
Pt came in to drop off a copy of his report for  [facility],. For PCP it was place in folder

## 2022-02-13 ENCOUNTER — Telehealth: Payer: Self-pay | Admitting: Pharmacist

## 2022-02-13 NOTE — Telephone Encounter (Signed)
Multiple attempts to reach patient to discuss CGM (pt asked PCP about starting CGM). Unable to reach patient. He has scheduled f/u with pharmacist 03/15/22, can discuss then.

## 2022-02-15 NOTE — Progress Notes (Signed)
Tried calling patient again to reschedule his 03/15/22 telephone appointment with [name].  No answer; left message.  [name], CPP notified  Marijean Niemann, [place] Clinical Pharmacy Assistant [phone]

## 2022-03-12 ENCOUNTER — Telehealth: Payer: Self-pay

## 2022-03-12 NOTE — Chronic Care Management (AMB) (Signed)
    Chronic Care Management Pharmacy Assistant   Name: Philip Ponce  MRN: [id number] DOB: 30-Sep-1970  Reason for Encounter: Reminder Call   Conditions to be addressed/monitored: HTN, HLD, and DMII   Medications: Outpatient Encounter Medications as of 03/12/2022  Medication Sig   Blood Glucose Monitoring Suppl (ONE TOUCH ULTRA 2) w/Device KIT Use to check blood sugar up to 2 times a day   calcium carbonate (OS-CAL) 600 MG TABS tablet Take 1,200 mg by mouth daily with breakfast.    carvedilol (COREG) 12.5 MG tablet TAKE 1 TABLET BY MOUTH TWICE DAILY   digoxin (LANOXIN) 0.125 MG tablet Take 1 tablet (125 mcg total) by mouth daily.   gabapentin (NEURONTIN) 100 MG capsule Take 1 capsule (100 mg total) by mouth at bedtime.   glucose blood (ONE TOUCH ULTRA TEST) test strip Check blood sugar up to 2 times a day and as instructed. Dx E11.9   Insulin Glargine (BASAGLAR KWIKPEN) 100 UNIT/ML INJECT 18 UNITS            SUBCUTANEOUSLY DAILY   Insulin Pen Needle (B-D ULTRAFINE III SHORT PEN) 31G X 8 MM MISC Use to inject insulin daily   Lancets (ONETOUCH ULTRASOFT) lancets Use to check blood sugar up to 2 times a day.   metFORMIN (GLUCOPHAGE-XR) 500 MG 24 hr tablet Take 1 tablet (500 mg total) by mouth daily with breakfast.   Multiple Vitamins-Minerals (ADULT GUMMY PO) Take 2 tablets by mouth daily.   omeprazole (PRILOSEC) 40 MG capsule Take 40 mg by mouth daily.   OVER THE COUNTER MEDICATION SEA MOSS SUPPLEMENT   sacubitril-valsartan (ENTRESTO) 97-103 MG Take 1 tablet by mouth 2 (two) times daily.   spironolactone (ALDACTONE) 25 MG tablet Take 1 tablet (25 mg total) by mouth daily.   No facility-administered encounter medications on file as of 03/12/2022.   [name] was contacted to remind of upcoming telephone visit with [name]  on 03/15/22 at 3:00pm. Patient was reminded to have any blood glucose and blood pressure readings available for review at appointment.   Message  was left reminding patient of appointment.- unsuccessful outreach, no VM available    CCM referral has been placed prior to visit?  Yes    Star Rating Drugs: Medication:  Last Fill: [facility] [employer] [facility]  02/08/22 84 Metformin xr $RemoveBefo'500mg'MCbrwxYeAHl$   01/22/22  90 Entresto 97-$RemoveBefore'103mg'[id number]$  02/08/22 [place], CPP notified  Avel Sensor, [place]  [phone]

## 2022-03-15 ENCOUNTER — Ambulatory Visit (INDEPENDENT_AMBULATORY_CARE_PROVIDER_SITE_OTHER): Payer: Medicare HMO | Admitting: Pharmacist

## 2022-03-15 DIAGNOSIS — I5042 Chronic combined systolic (congestive) and diastolic (congestive) heart failure: Secondary | ICD-10-CM

## 2022-03-15 DIAGNOSIS — E1159 Type 2 diabetes mellitus with other circulatory complications: Secondary | ICD-10-CM

## 2022-03-15 DIAGNOSIS — E1169 Type 2 diabetes mellitus with other specified complication: Secondary | ICD-10-CM

## 2022-03-15 DIAGNOSIS — E119 Type 2 diabetes mellitus without complications: Secondary | ICD-10-CM

## 2022-03-15 NOTE — Progress Notes (Unsigned)
Chronic Care Management Pharmacy Note  03/15/2022 Name:  Philip Ponce MRN:  [id number] DOB:  1971-05-06  Summary: CCM F/U visit -Pt requests CGM; he will qualify for Dexcom -Pt has indication for a statin (diabetes) but LDL is 38 without treatment and he declines additional medication  Recommendations/Changes made from today's visit: -Ordered Dexcom G7 to [employer] [facility] (via [employer])  Plan: -[place] will call patient 3 months for BG check -Pharmacist follow up televisit scheduled for 6 months   Subjective: Philip Ponce is an 51 y.o. year old male who is a primary patient of Bedsole, Amy E, MD.  The CCM team was consulted for assistance with disease management and care coordination needs.    Engaged with patient by telephone for follow up visit in response to provider referral for pharmacy case management and/or care coordination services.   Consent to Services:  The patient was given information about Chronic Care Management services, agreed to services, and gave verbal consent prior to initiation of services.  Please see initial visit note for detailed documentation.   Patient Care Team: [name], MD as PCP - General [name], MD as PCP - Electrophysiology (Cardiology) End, Harrell Gave, MD as PCP - Cardiology (Cardiology) Alisa Graff, FNP as Nurse Practitioner (Family Medicine) Charlton Haws, [facility] as Pharmacist (Pharmacist)  Patient lives at home with wife and teenage daughter. He is on disability for cardiomyopathy.  Recent office visits: 02/06/22 Dr [name] OV: f/u DM. A1c 8.3%; referred for CGM. Discuss Jardiance with cardiology.  08/08/21-PCP-Amy Bedsole,MD-Patient presented for AWV. Labs ordered( diabetes at goal, normal range labs) screenings discussed, vaccinations discussed,flu shot given, referral for Urology (vasctomy evaluation); no med changes.  05/12/21-PCP-Amy Bedsole,MD-Patient presented for left foot pain.Start  gabapentin 100 mg at bedtime for likely diabetic neuropathy vs sciatica pain Hold Melatonin,consider checking B12 and Thyroid   05/05/21-PCP-Amy Bedsole,MD-Patient presented for follow up diabetes-A1C worse, track BG's and follow up 2 weeks, may have to change medications.  Recent consult visits: 3/84/53 NP Delicia White (Cardiology): f/u HF. Consider SGLT2i at next visit 6/46/80 NP Delicia White (Cardiology): f/u HF. Digoxin level today.  06/13/21-Optometry-Robert Dickey-eye exam normal. 12/12/20 NP Delicia White (Cardiology VV): f/u recent ECHO. EF 40-50%, continue current mgmt.  03/31/21-Cardiology- no data found   Hospital visits: [facility]- Patient presented for lumbar back pain and dark urine concerns. He is positive covid-19.[facility]  03/10/21-[facility]-Patient presented for nasal congestion,positive covid-19, and dark urine.UA (abnormal)-increase  oral fluid intake with goal of 64-128 ounces of water daily   Objective:  Lab Results  Component Value Date   CREATININE 1.03 08/08/2021   BUN 16 08/08/2021   GFR 83.69 08/08/2021   GFRNONAA >60 02/12/2019   GFRAA >60 02/12/2019   NA 138 08/08/2021   K 4.6 08/08/2021   CALCIUM 9.0 08/08/2021   CO2 30 08/08/2021   GLUCOSE 191 (H) 08/08/2021    Lab Results  Component Value Date/Time   HGBA1C 8.3 (A) 02/06/2022 11:18 AM   HGBA1C 5.8 08/08/2021 03:05 PM   HGBA1C 8.1 (A) 05/05/2021 09:41 AM   HGBA1C 6.4 07/19/2020 08:31 AM   HGBA1C 8.4 (H) 08/04/2012 07:54 AM   GFR 83.69 08/08/2021 03:05 PM   GFR 94.09 07/19/2020 08:31 AM   MICROALBUR 1.6 07/19/2020 08:31 AM   MICROALBUR <0.7 07/23/2019 11:36 AM    Last diabetic Eye exam:  Lab Results  Component Value Date/Time   HMDIABEYEEXA No Retinopathy 05/08/2021 12:00 AM  Last diabetic Foot exam:  Lab Results  Component Value Date/Time   HMDIABFOOTEX done 05/01/2021 12:00 AM     Lab Results  Component Value Date   CHOL 125 08/08/2021   HDL  75.50 08/08/2021   LDLCALC 38 08/08/2021   TRIG 58.0 08/08/2021   CHOLHDL 2 08/08/2021       Latest Ref Rng & Units 08/08/2021    3:05 PM 07/19/2020    8:31 AM 07/10/2019    1:50 PM  Hepatic Function  Total Protein 6.0 - 8.3 g/dL 7.5  6.9  7.2   Albumin 3.5 - 5.2 g/dL 4.0  3.8  4.0   AST 0 - 37 U/L $Philip Ponce  20  15   'Philip Ponce$ ALT 0 - 53 U/L $Remo'13  18  14   'DlePv$ Alk Phosphatase 39 - 117 U/L 59  75  65   Total Bilirubin 0.2 - 1.2 mg/dL 0.4  0.6  0.6     Lab Results  Component Value Date/Time   TSH 0.61 08/08/2021 03:05 PM   TSH 1.019 11/30/2016 08:32 AM   TSH 0.549 08/04/2012 07:54 AM       Latest Ref Rng & Units 12/22/2020   11:09 AM 06/22/2020   10:45 AM 12/18/2019   11:44 AM  CBC  WBC 4.0 - 10.5 K/uL 4.1  5.2  4.3   Hemoglobin 13.0 - 17.0 g/dL 11.8  11.4  12.4   Hematocrit 39.0 - 52.0 % 36.2  34.3  38.8   Platelets 150 - 400 K/uL 190  257  217     No results found for: "VD25OH"  Clinical ASCVD: Yes  The ASCVD Risk score (Arnett DK, et al., 2019) failed to calculate for the following reasons:   The patient has a prior MI or stroke diagnosis       07/20/2021   12:15 PM 07/19/2020   12:07 PM 02/18/2018    9:09 AM  Depression screen PHQ 2/9  Decreased Interest 0 0 0  Down, Depressed, Hopeless 0 0 0  PHQ - 2 Score 0 0 0  Altered sleeping  0   Tired, decreased energy  0   Change in appetite  0   Feeling bad or failure about yourself   0   Trouble concentrating  0   Moving slowly or fidgety/restless  0   Suicidal thoughts  0   PHQ-9 Score  0   Difficult doing work/chores  Not difficult at all      Social History   Tobacco Use  Smoking Status Never  Smokeless Tobacco Never   BP Readings from Last 3 Encounters:  02/06/22 110/70  08/08/21 118/82  05/12/21 124/86   Pulse Readings from Last 3 Encounters:  02/06/22 67  08/08/21 63  05/12/21 87   Wt Readings from Last 3 Encounters:  02/06/22 213 lb 4 oz (96.7 kg)  08/08/21 207 lb 8 oz (94.1 kg)  07/20/21 207 lb (93.9 kg)    BMI Readings from Last 3 Encounters:  02/06/22 29.53 kg/m  08/08/21 28.74 kg/m  07/20/21 28.87 kg/m    Assessment/Interventions: Review of patient past medical history, allergies, medications, health status, including review of consultants reports, laboratory and other test data, was performed as part of comprehensive evaluation and provision of chronic care management services.   SDOH:  (Social Determinants of Health) assessments and interventions performed: No - done Oct 2022  SDOH Screenings   Alcohol Screen: Low Risk  (07/20/2021)   Alcohol Screen    Last Alcohol  Screening Score (AUDIT): 0  Depression (PHQ2-9): Low Risk  (07/20/2021)   Depression (PHQ2-9)    PHQ-2 Score: 0  Financial Resource Strain: Low Risk  (07/20/2021)   Overall Financial Resource Strain (CARDIA)    Difficulty of Paying Living Expenses: Not hard at all  Food Insecurity: No Food Insecurity (07/20/2021)   Hunger Vital Sign    Worried About Running Out of Food in the Last Year: Never true    Ran Out of Food in the Last Year: Never true  Housing: Low Risk  (07/20/2021)   Housing    Last Housing Risk Score: 0  Physical Activity: Insufficiently Active (07/20/2021)   Exercise Vital Sign    Days of Exercise per Week: 3 days    Minutes of Exercise per Session: 30 min  Social Connections: Socially Integrated (07/20/2021)   Social Connection and Isolation Panel [NHANES]    Frequency of Communication with Friends and Family: More than three times a week    Frequency of Social Gatherings with Friends and Family: Three times a week    Attends Religious Services: More than 4 times per year    Active Member of Clubs or Organizations: Yes    Attends Archivist Meetings: Never    Marital Status: Married  Stress: No Stress Concern Present (07/20/2021)   [place]    Feeling of Stress : Not at all  Tobacco Use: Low Risk  (02/06/2022)    Patient History    Smoking Tobacco Use: Never    Smokeless Tobacco Use: Never    Passive Exposure: Not on file  Transportation Needs: No Transportation Needs (07/20/2021)   PRAPARE - Transportation    Lack of Transportation (Medical): No    Lack of Transportation (Non-Medical): No    CCM Care Plan  Allergies  Allergen Reactions   Mobic [Meloxicam] Other (See Comments)    Ulcers in stomach eruption    Tramadol     Hard to breath   Diclofenac Palpitations and Other (See Comments)    Fast heart beat, CP,SOB and weakness on one side.    Medications Reviewed Today     Reviewed by Carter Kitten, CMA (Certified Medical Assistant) on 02/06/22 at 33  Med List Status: <None>   Medication Order Taking? Sig Documenting Provider Last Dose Status Informant  Blood Glucose Monitoring Suppl (ONE TOUCH ULTRA 2) w/Device KIT [id number]  Use to check blood sugar up to 2 times a day Bedsole, Amy E, MD  Active   calcium carbonate (OS-CAL) 600 MG TABS tablet [id number]  Take 1,200 mg by mouth daily with breakfast.  [provider]  Active Self  carvedilol (COREG) 12.5 MG tablet [id number]  TAKE 1 TABLET BY MOUTH TWICE DAILY Darylene Price A, FNP  Active   digoxin (LANOXIN) 0.125 MG tablet [id number]  Take 1 tablet (125 mcg total) by mouth daily. [name], MD  Active   gabapentin (NEURONTIN) 100 MG capsule [id number]  Take 1 capsule (100 mg total) by mouth at bedtime. Bedsole, Amy E, MD  Active   glucose blood (ONE TOUCH ULTRA TEST) test strip [id number]  Check blood sugar up to 2 times a day and as instructed. Dx E11.9 [name], MD  Active   Insulin Glargine (BASAGLAR KWIKPEN) 100 UNIT/ML [id number]  INJECT 18 UNITS            SUBCUTANEOUSLY DAILY Bedsole, Amy E, MD  Active   Insulin Pen Needle (B-D  ULTRAFINE III SHORT PEN) 31G X 8 MM MISC [id number]  Use to inject insulin daily Bedsole, Amy E, MD  Active   Lancets [facility] ULTRASOFT) lancets [id number]  Use to check blood sugar up to 2  times a day. Bedsole, Amy E, MD  Active   metFORMIN (GLUCOPHAGE-XR) 500 MG 24 hr tablet [id number]  Take 1 tablet (500 mg total) by mouth daily with breakfast. [name], Amy E, MD  Active   Multiple Vitamins-Minerals (ADULT GUMMY PO) [id number]  Take 2 tablets by mouth daily. [provider]  Active Self  omeprazole (PRILOSEC) 40 MG capsule [id number]  TAKE 1 CAPSULE DAILY AS    NEEDED FOR INDIGESTION  Patient taking differently: Take 40 mg by mouth daily.   [name], MD  Active   OVER THE COUNTER MEDICATION [id number]  [place] SUPPLEMENT [provider]  Active   sacubitril-valsartan (ENTRESTO) 97-103 MG [id number]  Take 1 tablet by mouth 2 (two) times daily. [name], MD  Active   spironolactone (ALDACTONE) 25 MG tablet [id number]  Take 1 tablet (25 mg total) by mouth daily. [name], MD  Active             Patient Active Problem List   Diagnosis Date Noted   Sprain of right thumb 02/06/2022   Left foot pain 05/12/2021   Numbness of left foot 05/12/2021   COVID-19 virus infection 03/08/2021   Bilateral chronic knee pain 05/30/2018   Status post biventricular pacemaker 10/09/2017   S/P left rotator cuff repair 10/04/2017   Cervical radiculopathy 07/24/2017   Rotator cuff tendonitis, left 05/06/2017   Chronic combined systolic and diastolic heart failure ([place]) 12/18/2016   Non-ischemic cardiomyopathy ([place]) 12/04/2016   Iron deficiency anemia    History of bariatric surgery 11/20/2016   Toenail fungus 10/14/2015   RBBB 04/07/2015   LBBB (left bundle branch block) 09/24/2013   Obstructive apnea 09/24/2013   GERD 09/29/2010   ERECTILE DYSFUNCTION, 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   Hypertension associated with diabetes ([place]) 07/09/2007   Hyperlipidemia associated with type 2 diabetes mellitus ([place]) 06/12/2007   Type 2 diabetes mellitus without complication, with long-term current use of insulin ([place]) 06/04/2007    Immunization History  Administered Date(s)  Administered   Influenza, Seasonal, Injecte, Preservative Fre 10/07/2012, 09/28/2013, 10/19/2016   Influenza,inj,Quad PF,6+ Mos 07/19/2015, 06/27/2017, 07/15/2018, 07/23/2019, 07/26/2020, 08/08/2021   Influenza-Unspecified 07/25/2016   PFIZER(Purple Top)SARS-COV-2 Vaccination 12/17/2019, 01/11/2020, 06/28/2020   PNEUMOCOCCAL CONJUGATE-20 12/13/2021   Pfizer Covid-19 Vaccine Bivalent Booster 16yrs & up 12/13/2021   Pneumococcal Polysaccharide-23 10/07/2012, 12/24/2017   Tdap 02/27/2011   Zoster Recombinat (Shingrix) 09/23/2020, 03/31/2021    Conditions to be addressed/monitored:  Hypertension, Hyperlipidemia, Diabetes, Heart Failure, and GERD  There are no care plans that you recently modified to display for this patient.     Medication Assistance: None required.  Patient affirms current coverage meets needs. - pt has Medicare/Medicaid.   Compliance/Adherence/Medication fill history: Care Gaps: Colonoscopy (due 12/03/21)  Star-Rating Drugs: Metformin - PDC 100%  Medication Access: Within the past 30 days, how often has patient missed a dose of medication? *** Is a pillbox or other method used to improve adherence? {YES/NO:21197} Factors that may affect medication adherence? {CHL DESC; BARRIERS:21522} Are meds synced by current pharmacy? {YES/NO:21197} Are meds delivered by current pharmacy? {YES/NO:21197} Does patient experience delays in picking up medications due to transportation concerns? {YES/NO:21197}  [facility] Services Reviewed: Is patient disadvantaged to use [facility]?: {YES/NO:21197} Current Rx insurance plan: *** Name  and location of Current pharmacy:  [facility], [place] [facility] [address] [place] [address] Phone: [phone] Fax: [phone]  [facility] services reviewed with patient today?: {YES/NO:21197} Patient requests to transfer care to  [facility]?: {YES/NO:21197} Reason patient declined to change pharmacies: {US patient preference:27474}   Care Plan and Follow Up Patient Decision:  Patient agrees to Care Plan and Follow-up.  Plan: Telephone follow up appointment with care management team member scheduled for:  6 months  [name], PharmD, BCACP Clinical Pharmacist [facility] [phone]   Current Barriers:  {pharmacybarriers:24917}  Pharmacist Clinical Goal(s):  Patient will {PHARMACYGOALCHOICES:24921} through collaboration with PharmD and provider.   Interventions: 1:1 collaboration with [name], MD regarding development and update of comprehensive plan of care as evidenced by provider attestation and co-signature Inter-disciplinary care team collaboration (see longitudinal plan of care) Comprehensive medication review performed; medication list updated in electronic medical record  Hypertension / Heart Failure (BP goal <130/80) -Controlled -Last ejection fraction: 40-50% (Date: 03/07/21) (improved from 30-35%) -HF type: Combined Systolic and Diastolic; NYHA Class: II (slight limitation of activity) -s/p BIV ICD -Current home BP readings: not checking; Denies hypotensive/hypertensive symptoms including dizziness -Daily weight: 213 -Current treatment: Carvedilol 12.5 mg BID - Appropriate, Effective, Safe, Accessible Digoxin 0.125 mg daily -Appropriate, Effective, Safe, Accessible Entresto 97-103 mg BID -Appropriate, Effective, Safe, Accessible Spironolactone 25 mg daily -Appropriate, Effective, Safe, Accessible -Medications previously tried: furosemide, isosorbide  -Educated on BP goals and benefits of medications for prevention of heart attack, stroke and kidney damage;Exercise goal of 150 minutes per week; -Educated on Benefits of medications for managing symptoms and prolonging life -Counseled to monitor BP at home periodically -Recommended to continue  current medication  Hyperlipidemia: (LDL goal < 100) -Controlled - LDL is 38 without medication; pt has indication for statin (diabetes) but defers initiation -cath 11/2016 - no CAD. Hx statin intolerance? -Current treatment: None -Educated on Cholesterol goals;  -Counseled on diet and exercise extensively  Diabetes (A1c goal <7%) -Uncontrolled - A1c 8.3% (01/2022); pt endorses compliance with current regimen and denies frequent hypoglycemia - pt has hypoglycemic sx when BG is in 70s which happens less than once a month -h/o bariatric surgery 10/2016 -Current home glucose readings fasting glucose: *** -Current medications: Basaglar 18 units daily PM (6pm) Metformin ER 500 mg daily Testing supplies -Medications previously tried/failed: glipizide, Jardiance (12/2018-02/2019), NPH 70/30 -Current meal patterns: no sweets, breads -Current exercise: gym 3x a week -Educated on A1c and blood sugar goals; Prevention and management of hypoglycemic episodes; Benefits of routine self-monitoring of blood sugar; Sick day management: maintain hydration with high fluid intake and call MD if glucose above 400 -Consider necessity of insulin - pt may be able to control DM with other medications (SGLT2 or GLP-1) that have additional benefits and do not promote hypoglycemia; his insurance plan covers brand-name drugs at low copay (<$10) so cost would not be a barrier - pt deferred changes today but may consider at follow up -Recommended to continue current medication for now  Health Maintenance -Vaccine gaps: Prevnar, covid booster, TDAP  Patient Goals/Self-Care Activities Patient will:  - take medications as prescribed as evidenced by patient report and record review focus on medication adherence by pill box check glucose daily, document, and provide at future appointments check blood pressure periodically, document, and provide at future appointments

## 2022-03-22 ENCOUNTER — Telehealth: Payer: Self-pay

## 2022-03-22 DIAGNOSIS — E1165 Type 2 diabetes mellitus with hyperglycemia: Secondary | ICD-10-CM | POA: Diagnosis not present

## 2022-03-22 DIAGNOSIS — Z794 Long term (current) use of insulin: Secondary | ICD-10-CM | POA: Diagnosis not present

## 2022-03-22 NOTE — Assessment & Plan Note (Signed)
Acute, Ice and elevate left hand for thumb sprain. You can use OTC voltaren gel/cream 4 times daily for pain and inflammation.

## 2022-03-22 NOTE — Chronic Care Management (AMB) (Signed)
Several attempts to reach the patient. The patient has robo call blocks on his phone and we never made contact. Reached out to the wife Lattie Haw on her phone. She took the information from the manufacturer and will have the patient return the call to [facility] today .  [name], CPP notified  Avel Sensor, [place]  [phone]

## 2022-03-22 NOTE — Assessment & Plan Note (Signed)
Work on low [employer], continue exercise.  Work on weight loss.  We will set up referral with pharmacist to see if we can get the  continuous glucose monitor set up.  Continue metformin and Balsalglar for now.  If blood sugar is not improving we can consider Jardiance addition  You can discuss addition of Jardiance with cardiologist . Set up yearly eye exam for diabetes and have the opthalmologist send [place] a copy of the evaluation for the chart.

## 2022-03-22 NOTE — Telephone Encounter (Signed)
-----   Message from Charlton Haws, [facility] sent at 03/21/2022  9:28 AM EDT ----- Regarding: call patient Philip Ponce has been trying to reach patient to coordinate Dexcom delivery - pt has not answered or returned called. Please ask patient to call [place] directly: [phone]

## 2022-03-23 DIAGNOSIS — I5040 Unspecified combined systolic (congestive) and diastolic (congestive) heart failure: Secondary | ICD-10-CM | POA: Diagnosis not present

## 2022-03-23 DIAGNOSIS — E1159 Type 2 diabetes mellitus with other circulatory complications: Secondary | ICD-10-CM | POA: Diagnosis not present

## 2022-03-23 DIAGNOSIS — E785 Hyperlipidemia, unspecified: Secondary | ICD-10-CM | POA: Diagnosis not present

## 2022-03-23 DIAGNOSIS — Z794 Long term (current) use of insulin: Secondary | ICD-10-CM | POA: Diagnosis not present

## 2022-03-23 DIAGNOSIS — Z7984 Long term (current) use of oral hypoglycemic drugs: Secondary | ICD-10-CM

## 2022-03-23 DIAGNOSIS — I110 Hypertensive heart disease with heart failure: Secondary | ICD-10-CM | POA: Diagnosis not present

## 2022-03-30 ENCOUNTER — Ambulatory Visit (INDEPENDENT_AMBULATORY_CARE_PROVIDER_SITE_OTHER): Payer: Medicare HMO

## 2022-03-30 DIAGNOSIS — I428 Other cardiomyopathies: Secondary | ICD-10-CM | POA: Diagnosis not present

## 2022-03-31 LAB — CUP PACEART REMOTE DEVICE CHECK
Battery Remaining Longevity: 22 mo
Battery Voltage: 2.93 V
Brady Statistic AP VP Percent: 0.01 %
Brady Statistic AP VS Percent: 0 %
Brady Statistic AS VP Percent: 98.79 %
Brady Statistic AS VS Percent: 1.21 %
Brady Statistic RA Percent Paced: 0.01 %
Brady Statistic RV Percent Paced: 85.26 %
Date Time Interrogation Session: 20230707001604
HighPow Impedance: 57 Ohm
Implantable Lead Implant Date: 20181003
Implantable Lead Implant Date: 20181003
Implantable Lead Implant Date: 20181003
Implantable Lead Location: 753858
Implantable Lead Location: 753859
Implantable Lead Location: 753860
Implantable Lead Model: 4398
Implantable Lead Model: 5076
Implantable Pulse Generator Implant Date: 20181003
Lead Channel Impedance Value: 188.1 Ohm
Lead Channel Impedance Value: 208.174
Lead Channel Impedance Value: 216.367
Lead Channel Impedance Value: 234.08 Ohm
Lead Channel Impedance Value: 244.491
Lead Channel Impedance Value: 247 Ohm
Lead Channel Impedance Value: 342 Ohm
Lead Channel Impedance Value: 342 Ohm
Lead Channel Impedance Value: 418 Ohm
Lead Channel Impedance Value: 418 Ohm
Lead Channel Impedance Value: 532 Ohm
Lead Channel Impedance Value: 589 Ohm
Lead Channel Impedance Value: 703 Ohm
Lead Channel Impedance Value: 722 Ohm
Lead Channel Impedance Value: 779 Ohm
Lead Channel Impedance Value: 817 Ohm
Lead Channel Impedance Value: 836 Ohm
Lead Channel Impedance Value: 836 Ohm
Lead Channel Pacing Threshold Amplitude: 0.625 V
Lead Channel Pacing Threshold Amplitude: 0.75 V
Lead Channel Pacing Threshold Amplitude: 1.25 V
Lead Channel Pacing Threshold Pulse Width: 0.4 ms
Lead Channel Pacing Threshold Pulse Width: 0.4 ms
Lead Channel Pacing Threshold Pulse Width: 0.4 ms
Lead Channel Sensing Intrinsic Amplitude: 2.125 mV
Lead Channel Sensing Intrinsic Amplitude: 2.125 mV
Lead Channel Sensing Intrinsic Amplitude: 7.875 mV
Lead Channel Sensing Intrinsic Amplitude: 7.875 mV
Lead Channel Setting Pacing Amplitude: 1.5 V
Lead Channel Setting Pacing Amplitude: 2 V
Lead Channel Setting Pacing Amplitude: 2.5 V
Lead Channel Setting Pacing Pulse Width: 0.4 ms
Lead Channel Setting Pacing Pulse Width: 0.4 ms
Lead Channel Setting Sensing Sensitivity: 0.3 mV

## 2022-04-10 ENCOUNTER — Other Ambulatory Visit: Payer: Self-pay | Admitting: Family Medicine

## 2022-04-10 ENCOUNTER — Telehealth: Payer: Self-pay | Admitting: Family Medicine

## 2022-04-10 DIAGNOSIS — E119 Type 2 diabetes mellitus without complications: Secondary | ICD-10-CM

## 2022-04-10 NOTE — Telephone Encounter (Signed)
Patient wife Philip Ponce called in stating Philip Ponce was informed to give [place] a call when he received his Glucose monitor. She stated that an appointment was needing to be made for him to come in, get it put on, and to learn how to use it. Please advise. Thank you!

## 2022-04-11 ENCOUNTER — Telehealth: Payer: Self-pay

## 2022-04-11 NOTE — Telephone Encounter (Signed)
Pt called to confirmed his appointment on 04/17/22

## 2022-04-11 NOTE — Progress Notes (Signed)
    Chronic Care Management Pharmacy Assistant   Name: Philip Ponce  MRN: [id number] DOB: 1970-09-25  Reason for Encounter: CCM (Schedule CGM Appointment)   Called patient to schedule in-office CGM training appointment with [name]. Tried calling patient's home number; no answer; no voicemail. Called wife's cell number; no answer; left voicemail. I have scheduled patient an in office appointment with Mendel Ryder for CGM training on 04/17/2022 at 2:15.   [name], CPP notified  Marijean Niemann, [place] Clinical Pharmacy Assistant [phone]

## 2022-04-16 NOTE — Progress Notes (Signed)
Remote ICD transmission.   

## 2022-04-17 ENCOUNTER — Ambulatory Visit: Payer: Medicare HMO

## 2022-04-21 DIAGNOSIS — E1165 Type 2 diabetes mellitus with hyperglycemia: Secondary | ICD-10-CM | POA: Diagnosis not present

## 2022-04-21 DIAGNOSIS — Z794 Long term (current) use of insulin: Secondary | ICD-10-CM | POA: Diagnosis not present

## 2022-05-03 DIAGNOSIS — Z794 Long term (current) use of insulin: Secondary | ICD-10-CM | POA: Diagnosis not present

## 2022-05-03 DIAGNOSIS — E119 Type 2 diabetes mellitus without complications: Secondary | ICD-10-CM | POA: Diagnosis not present

## 2022-05-03 DIAGNOSIS — I428 Other cardiomyopathies: Secondary | ICD-10-CM | POA: Diagnosis not present

## 2022-05-03 DIAGNOSIS — G4733 Obstructive sleep apnea (adult) (pediatric): Secondary | ICD-10-CM | POA: Diagnosis not present

## 2022-05-03 DIAGNOSIS — Z79899 Other long term (current) drug therapy: Secondary | ICD-10-CM | POA: Diagnosis not present

## 2022-05-03 DIAGNOSIS — Z95810 Presence of automatic (implantable) cardiac defibrillator: Secondary | ICD-10-CM | POA: Diagnosis not present

## 2022-05-03 DIAGNOSIS — I5020 Unspecified systolic (congestive) heart failure: Secondary | ICD-10-CM | POA: Diagnosis not present

## 2022-05-03 DIAGNOSIS — I10 Essential (primary) hypertension: Secondary | ICD-10-CM | POA: Diagnosis not present

## 2022-05-03 DIAGNOSIS — E7800 Pure hypercholesterolemia, unspecified: Secondary | ICD-10-CM | POA: Diagnosis not present

## 2022-05-21 DIAGNOSIS — E1165 Type 2 diabetes mellitus with hyperglycemia: Secondary | ICD-10-CM | POA: Diagnosis not present

## 2022-05-21 DIAGNOSIS — Z794 Long term (current) use of insulin: Secondary | ICD-10-CM | POA: Diagnosis not present

## 2022-06-11 ENCOUNTER — Ambulatory Visit: Payer: Medicare HMO | Attending: Cardiology | Admitting: Cardiology

## 2022-06-11 ENCOUNTER — Encounter: Payer: Self-pay | Admitting: Cardiology

## 2022-06-11 VITALS — BP 100/64 | HR 59 | Ht 71.0 in | Wt 215.0 lb

## 2022-06-11 DIAGNOSIS — I5022 Chronic systolic (congestive) heart failure: Secondary | ICD-10-CM

## 2022-06-11 LAB — CUP PACEART INCLINIC DEVICE CHECK
Battery Remaining Longevity: 21 mo
Battery Voltage: 2.92 V
Brady Statistic AP VP Percent: 0.01 %
Brady Statistic AP VS Percent: 0 %
Brady Statistic AS VP Percent: 98.77 %
Brady Statistic AS VS Percent: 1.21 %
Brady Statistic RA Percent Paced: 0.01 %
Brady Statistic RV Percent Paced: 86.85 %
Date Time Interrogation Session: 20230918210001
HighPow Impedance: 58 Ohm
Implantable Lead Implant Date: 20181003
Implantable Lead Implant Date: 20181003
Implantable Lead Implant Date: 20181003
Implantable Lead Location: 753858
Implantable Lead Location: 753859
Implantable Lead Location: 753860
Implantable Lead Model: 4398
Implantable Lead Model: 5076
Implantable Pulse Generator Implant Date: 20181003
Lead Channel Impedance Value: 184.154
Lead Channel Impedance Value: 198.837
Lead Channel Impedance Value: 208.174
Lead Channel Impedance Value: 216.848
Lead Channel Impedance Value: 228 Ohm
Lead Channel Impedance Value: 285 Ohm
Lead Channel Impedance Value: 342 Ohm
Lead Channel Impedance Value: 342 Ohm
Lead Channel Impedance Value: 399 Ohm
Lead Channel Impedance Value: 418 Ohm
Lead Channel Impedance Value: 475 Ohm
Lead Channel Impedance Value: 532 Ohm
Lead Channel Impedance Value: 646 Ohm
Lead Channel Impedance Value: 722 Ohm
Lead Channel Impedance Value: 722 Ohm
Lead Channel Impedance Value: 779 Ohm
Lead Channel Impedance Value: 779 Ohm
Lead Channel Impedance Value: 817 Ohm
Lead Channel Pacing Threshold Amplitude: 0.625 V
Lead Channel Pacing Threshold Amplitude: 0.625 V
Lead Channel Pacing Threshold Amplitude: 1.25 V
Lead Channel Pacing Threshold Pulse Width: 0.4 ms
Lead Channel Pacing Threshold Pulse Width: 0.4 ms
Lead Channel Pacing Threshold Pulse Width: 0.4 ms
Lead Channel Sensing Intrinsic Amplitude: 1.875 mV
Lead Channel Sensing Intrinsic Amplitude: 1.875 mV
Lead Channel Sensing Intrinsic Amplitude: 7.5 mV
Lead Channel Sensing Intrinsic Amplitude: 7.625 mV
Lead Channel Setting Pacing Amplitude: 1.5 V
Lead Channel Setting Pacing Amplitude: 1.5 V
Lead Channel Setting Pacing Amplitude: 2 V
Lead Channel Setting Pacing Pulse Width: 0.4 ms
Lead Channel Setting Pacing Pulse Width: 0.4 ms
Lead Channel Setting Sensing Sensitivity: 0.3 mV

## 2022-06-11 MED ORDER — METOPROLOL SUCCINATE ER 100 MG PO TB24
100.0000 mg | ORAL_TABLET | Freq: Every day | ORAL | 6 refills | Status: DC
Start: 2022-06-11 — End: 2022-08-07

## 2022-06-11 NOTE — Progress Notes (Signed)
Electrophysiology Office Note   Date:  06/11/2022   ID:  Philip Ponce, DOB 12-12-1970, MRN [id number]  PCP:  [name], MD  Cardiologist:  [name] Primary Electrophysiologist:  Torre Schaumburg Meredith Leeds, MD    No chief complaint on file.    History of Present Illness: Philip Ponce is a 51 y.o. male who is being seen today for the evaluation of CHF at the request of [name], MD. Presenting today for electrophysiology evaluation.  He has a history seen for diabetes, nonischemic cardiomyopathy, gastric bypass.  He had a GI bleed that occurred in the setting of NSAIDs.  March 2018 he was admitted to [facility] with chest pain, dyspnea, left bundle branch block.  He does not have an ejection fraction of 20 to 25%.  He is now status post Medtronic CRT-D implanted 06/26/2017.  Today, denies symptoms of palpitations, chest pain, shortness of breath, orthopnea, PND, lower extremity edema, claudication, dizziness, presyncope, syncope, bleeding, or neurologic sequela. The patient is tolerating medications without difficulties.  Since being seen he has done well.  He has had no chest pain or shortness of breath.  He is able do all of his daily activities without restriction.     Past Medical History:  Diagnosis Date   Acute blood loss anemia 11/20/2016   AICD (automatic cardioverter/defibrillator) present    a. 06/2017 s/p MDT DTMA 1QQ [place] MRI Quad CRT-D SureScan (ser # [id number]).   Chronic combined systolic (congestive) and diastolic (congestive) heart failure ([place])    a. 11/2016 Echo: EF 20-25%, glob HK, antsept, ant, apical HK, Gr1 DD, mild MR, mildly dil LA, nl RV fxn; b. 05/2017 Echo: Ef 20-25%, Gr2 DD, prominent apical trabeculations; c. 12/2017 Echo: EF 30-35%, Gr1 DD, glob HK.   COVID-19 03/08/2021   Diabetes mellitus ([place])    Essential hypertension    Family history of adverse reaction to anesthesia    mom had a hard time waking up   GERD (gastroesophageal  reflux disease)    NICM (nonischemic cardiomyopathy) ([place])    a. 11/2016 Cath: nl cors, EF 25-30%; b. 11/2016 Echo: EF 20-25%; c. 05/2017 Echo: EF 20-25%; d. 12/2017 Echo: EF 30-35%, Gr1 DD.   Slipped intervertebral disc    L4 L5   STEMI (ST elevation myocardial infarction) ([place])    a. 11/2016 ST elevation -->Nl cors on cath.   Past Surgical History:  Procedure Laterality Date   BIV ICD INSERTION CRT-D  06/26/2017   BIV ICD INSERTION CRT-D N/A 06/26/2017   Procedure: BIV ICD INSERTION CRT-D;  Surgeon: [name], MD;  Location: [facility] [facility];  Service: Cardiovascular;  Laterality: N/A;   CERVICAL DISC ARTHROPLASTY N/A 07/24/2017   Procedure: CERVICAL ANTERIOR [place] ARTHROPLASTY C5-C7;  Surgeon: [name], MD;  Location: [facility];  Service: Neurosurgery;  Laterality: N/A;   COLONOSCOPY WITH PROPOFOL N/A 12/03/2016   Procedure: COLONOSCOPY WITH PROPOFOL;  Surgeon: [name], MD;  Location: [facility];  Service: Endoscopy;  Laterality: N/A;   ESOPHAGOGASTRODUODENOSCOPY (EGD) WITH PROPOFOL N/A 12/03/2016   Procedure: ESOPHAGOGASTRODUODENOSCOPY (EGD) WITH PROPOFOL;  Surgeon: [name], MD;  Location: [facility];  Service: Endoscopy;  Laterality: N/A;   FINGER SURGERY Right 2012   index finger   GASTRIC BYPASS     GIVENS CAPSULE STUDY N/A 01/07/2017   Procedure: GIVENS CAPSULE STUDY;  Surgeon: [name], MD;  Location: [facility];  Service: Endoscopy;  Laterality: N/A;   LEFT HEART CATH AND CORONARY ANGIOGRAPHY N/A  12/04/2016   Procedure: Left Heart Cath and Coronary Angiography;  Surgeon: [name], MD;  Location: [place] [facility];  Service: Cardiovascular;  Laterality: N/A;   SHOULDER ARTHROSCOPY WITH OPEN ROTATOR CUFF REPAIR Left 10/04/2017   Procedure: SHOULDER ARTHROSCOPY WITH ROTATOR CUFF REPAIR, SUBACROMIAL DECOMPRESSION,OPEN BICEP TENODESIS, EXTENSIVE DEBRIDEMENT;  Surgeon: [name], MD;  Location: [facility];  Service: Orthopedics;  Laterality:  Left;     Current Outpatient Medications  Medication Sig Dispense Refill   Blood Glucose Monitoring Suppl (ONE TOUCH ULTRA 2) w/Device KIT Use to check blood sugar up to 2 times a day 1 kit 0   calcium carbonate (OS-CAL) 600 MG TABS tablet Take 1,200 mg by mouth daily with breakfast.      Continuous Blood Gluc Sensor (DEXCOM G7 SENSOR) MISC by Does not apply route.     digoxin (LANOXIN) 0.125 MG tablet Take 1 tablet (125 mcg total) by mouth daily. 30 tablet 3   glucose blood (ONE TOUCH ULTRA TEST) test strip Check blood sugar up to 2 times a day and as instructed. Dx E11.9 100 each 5   Insulin Glargine (BASAGLAR KWIKPEN) 100 UNIT/ML INJECT 18 UNITS            SUBCUTANEOUSLY DAILY 15 mL 1   Insulin Pen Needle (B-D ULTRAFINE III SHORT PEN) 31G X 8 MM MISC Use to inject insulin daily 90 each 3   Lancets (ONETOUCH ULTRASOFT) lancets Use to check blood sugar up to 2 times a day. 100 each 5   metFORMIN (GLUCOPHAGE-XR) 500 MG 24 hr tablet Take 1 tablet (500 mg total) by mouth daily with breakfast. 90 tablet 3   metoprolol succinate (TOPROL-XL) 100 MG 24 hr tablet Take 1 tablet (100 mg total) by mouth daily. Take with or immediately following a meal. 30 tablet 6   Multiple Vitamins-Minerals (ADULT GUMMY PO) Take 2 tablets by mouth daily.     omeprazole (PRILOSEC) 40 MG capsule Take 40 mg by mouth daily.     OVER THE COUNTER MEDICATION SEA MOSS SUPPLEMENT     sacubitril-valsartan (ENTRESTO) 97-103 MG Take 1 tablet by mouth 2 (two) times daily. 180 tablet 3   spironolactone (ALDACTONE) 25 MG tablet Take 1 tablet (25 mg total) by mouth daily. 90 tablet 3   No current facility-administered medications for this visit.    Allergies:   Mobic [meloxicam], Tramadol, and Diclofenac   Social History:  The patient  reports that he has never smoked. He has never used smokeless tobacco. He reports that he does not currently use alcohol. He reports that he does not use drugs.   Family History:  The patient's  family history includes Cancer in his father, maternal grandfather, and mother; Diabetes in his father and mother; Hypertension in his father and mother; Stroke in his maternal grandfather.   ROS:  Please see the history of present illness.   Otherwise, review of systems is positive for none.   All other systems are reviewed and negative.   PHYSICAL EXAM: VS:  BP 100/64   Pulse (!) 59   Ht $R'5\' 11"'GG$  (1.803 m)   Wt 215 lb (97.5 kg)   SpO2 99%   BMI 29.99 kg/m  , BMI Body mass index is 29.99 kg/m. GEN: Well nourished, well developed, in no acute distress  HEENT: normal  Neck: no JVD, carotid bruits, or masses Cardiac: RRR; no murmurs, rubs, or gallops,no edema  Respiratory:  clear to auscultation bilaterally, normal work of breathing GI: soft, nontender,  nondistended, + BS MS: no deformity or atrophy  Skin: warm and dry, device site well healed Neuro:  Strength and sensation are intact Psych: euthymic mood, full affect  EKG:  EKG is ordered today. Personal review of the ekg ordered shows sinus rhythm, ventricular paced, rate 59  Personal review of the device interrogation today. Results in [place]: 08/08/2021: ALT 13; BUN 16; Creatinine, Ser 1.03; Potassium 4.6; Sodium 138; TSH 0.61    Lipid Panel     Component Value Date/Time   CHOL 125 08/08/2021 1505   TRIG 58.0 08/08/2021 1505   HDL 75.50 08/08/2021 1505   CHOLHDL 2 08/08/2021 1505   VLDL 11.6 08/08/2021 1505   LDLCALC 38 08/08/2021 1505     Wt Readings from Last 3 Encounters:  06/11/22 215 lb (97.5 kg)  02/06/22 213 lb 4 oz (96.7 kg)  08/08/21 207 lb 8 oz (94.1 kg)      Other studies Reviewed: Additional studies/ records that were reviewed today include: TTE 01/06/18  Review of the above records today demonstrates:  - Left ventricle: The cavity size was mildly dilated. Wall   thickness was normal. Systolic function was moderately to   severely reduced. The estimated ejection fraction was in the    range of 30% to 35%. Diffuse hypokinesis. Doppler parameters are   consistent with abnormal left ventricular relaxation (grade 1   diastolic dysfunction).   ASSESSMENT AND PLAN:  1.  Chronic systolic heart failure: Due to nonischemic cardiomyopathy.  Currently on optimal medical therapy.  Status post Medtronic CRT-D implanted 06/26/2017.  Device functioning appropriately.  He feels fatigued.  We Shanedra Lave stop his carvedilol and start him on Toprol-XL 100 mg daily.  2.  Hypertension: Currently well controlled   Current medicines are reviewed at length with the patient today.   The patient does not have concerns regarding his medicines.  The following changes were made today: Stop carvedilol, start Toprol-XL  Labs/ tests ordered today include:  Orders Placed This Encounter  Procedures   EKG 12-Lead     Disposition:   FU with Malya Cirillo 1 year  Signed, Davida Falconi Meredith Leeds, MD  06/11/2022 5:13 PM     [address] [place] [address] [phone] (office) [phone] (fax)

## 2022-06-11 NOTE — Patient Instructions (Addendum)
Medication Instructions:  Your physician has recommended you make the following change in your medication:  STOP Carvedilol START Metoprolol Succinate (Toprol) 100 mg once daily at bedtime  *If you need a refill on your cardiac medications before your next appointment, please call your pharmacy*   Lab Work: None ordered   Testing/Procedures: None ordered   Follow-Up: At [facility], you and your health needs are our priority.  As part of our continuing mission to provide you with exceptional heart care, we have created designated Provider Care Teams.  These Care Teams include your primary Cardiologist (physician) and Advanced Practice Providers (APPs -  Physician Assistants and Nurse Practitioners) who all work together to provide you with the care you need, when you need it.  Remote monitoring is used to monitor your Pacemaker or ICD from home. This monitoring reduces the number of office visits required to check your device to one time per year. It allows [place] to keep an eye on the functioning of your device to ensure it is working properly. You are scheduled for a device check from home on 06/29/22. You may send your transmission at any time that day. If you have a wireless device, the transmission will be sent automatically. After your physician reviews your transmission, you will receive a postcard with your next transmission date.  Your next appointment:   1 year(s)  The format for your next appointment:   In Person  Provider:   Allegra Lai, MD{   Thank you for choosing [facility]!!   Trinidad Curet, RN [phone]    Other Instructions  Important Information About Sugar        Metoprolol Extended-Release Tablets What is this medication? METOPROLOL (me TOE proe lole) treats high blood pressure and heart failure. It may also be used to prevent chest pain (angina). It works by lowering your blood pressure and heart rate, making it easier for your heart to  pump blood to the rest of your body. It belongs to a group of medications called beta blockers. This medicine may be used for other purposes; ask your health care provider or pharmacist if you have questions. COMMON BRAND NAME(S): toprol, Toprol XL What should I tell my care team before I take this medication? They need to know if you have any of these conditions: Diabetes Heart or vessel disease like slow heart rate, worsening heart failure, heart block, sick sinus syndrome, or Raynaud's disease Kidney disease Liver disease Lung or breathing disease, like asthma or emphysema Pheochromocytoma Thyroid disease An unusual or allergic reaction to metoprolol, other beta blockers, medications, foods, dyes, or preservatives Pregnant or trying to get pregnant Breast-feeding How should I use this medication? Take this medication by mouth. Take it as directed on the prescription label at the same time every day. Take it with food. You may cut the tablet in half if it is scored (has a line in the middle of it). This may help you swallow the tablet if the whole tablet is too big. Be sure to take both halves. Do not take just one-half of the tablet. Keep taking it unless your care team tells you to stop. Talk to your care team about the use of this medication in children. While it may be prescribed for children as young as 6 years for selected conditions, precautions do apply. Overdosage: If you think you have taken too much of this medicine contact a poison control center or emergency room at once. NOTE: This medicine is  only for you. Do not share this medicine with others. What if I miss a dose? If you miss a dose, take it as soon as you can. If it is almost time for your next dose, take only that dose. Do not take double or extra doses. What may interact with this medication? This medication may interact with the following: Certain medications for blood pressure, heart disease, irregular  heartbeat Certain medications for depression, like monoamine oxidase (MAO) inhibitors, fluoxetine, or paroxetine Clonidine Dobutamine Epinephrine Isoproterenol Reserpine This list may not describe all possible interactions. Give your health care provider a list of all the medicines, herbs, non-prescription drugs, or dietary supplements you use. Also tell them if you smoke, drink alcohol, or use illegal drugs. Some items may interact with your medicine. What should I watch for while using this medication? Visit your care team for regular checks on your progress. Check your blood pressure as directed. Ask your care team what your blood pressure should be. Also, find out when you should contact them. Do not treat yourself for coughs, colds, or pain while you are using this medication without asking your care team for advice. Some medications may increase your blood pressure. You may get drowsy or dizzy. Do not drive, use machinery, or do anything that needs mental alertness until you know how this medication affects you. Do not stand up or sit up quickly, especially if you are an older patient. This reduces the risk of dizzy or fainting spells. Alcohol may interfere with the effect of this medication. Avoid alcoholic drinks. This medication may increase blood sugar. Ask your care team if changes in diet or medications are needed if you have diabetes. What side effects may I notice from receiving this medication? Side effects that you should report to your care team as soon as possible: Allergic reactions--skin rash, itching, hives, swelling of the face, lips, tongue, or throat Heart failure--shortness of breath, swelling of the ankles, feet, or hands, sudden weight gain, unusual weakness or fatigue Low blood pressure--dizziness, feeling faint or lightheaded, blurry vision Raynaud's--cool, numb, or painful fingers or toes that may change color from pale, to blue, to red Slow heartbeat--dizziness,  feeling faint or lightheaded, confusion, trouble breathing, unusual weakness or fatigue Worsening mood, feelings of depression Side effects that usually do not require medical attention (report to your care team if they continue or are bothersome): Change in sex drive or performance Diarrhea Dizziness Fatigue Headache This list may not describe all possible side effects. Call your doctor for medical advice about side effects. You may report side effects to FDA at 1-800-FDA-1088. Where should I keep my medication? Keep out of the reach of children and pets. Store at room temperature between 20 and 25 degrees C (68 and 77 degrees F). Throw away any unused medication after the expiration date. NOTE: This sheet is a summary. It may not cover all possible information. If you have questions about this medicine, talk to your doctor, pharmacist, or health care provider.  2023 Elsevier/Gold Standard (2020-11-04 00:00:00)

## 2022-06-13 DIAGNOSIS — M25511 Pain in right shoulder: Secondary | ICD-10-CM | POA: Diagnosis not present

## 2022-06-29 ENCOUNTER — Ambulatory Visit (INDEPENDENT_AMBULATORY_CARE_PROVIDER_SITE_OTHER): Payer: Medicare HMO

## 2022-06-29 DIAGNOSIS — I428 Other cardiomyopathies: Secondary | ICD-10-CM | POA: Diagnosis not present

## 2022-07-01 LAB — CUP PACEART REMOTE DEVICE CHECK
Battery Remaining Longevity: 19 mo
Battery Voltage: 2.93 V
Brady Statistic AP VP Percent: 0.03 %
Brady Statistic AP VS Percent: 0 %
Brady Statistic AS VP Percent: 98.71 %
Brady Statistic AS VS Percent: 1.27 %
Brady Statistic RA Percent Paced: 0.03 %
Brady Statistic RV Percent Paced: 75.84 %
Date Time Interrogation Session: 20231006043623
HighPow Impedance: 59 Ohm
Implantable Lead Implant Date: 20181003
Implantable Lead Implant Date: 20181003
Implantable Lead Implant Date: 20181003
Implantable Lead Location: 753858
Implantable Lead Location: 753859
Implantable Lead Location: 753860
Implantable Lead Model: 4398
Implantable Lead Model: 5076
Implantable Pulse Generator Implant Date: 20181003
Lead Channel Impedance Value: 188.1 Ohm
Lead Channel Impedance Value: 188.1 Ohm
Lead Channel Impedance Value: 195.429
Lead Channel Impedance Value: 209 Ohm
Lead Channel Impedance Value: 218.087
Lead Channel Impedance Value: 304 Ohm
Lead Channel Impedance Value: 342 Ohm
Lead Channel Impedance Value: 361 Ohm
Lead Channel Impedance Value: 418 Ohm
Lead Channel Impedance Value: 418 Ohm
Lead Channel Impedance Value: 456 Ohm
Lead Channel Impedance Value: 456 Ohm
Lead Channel Impedance Value: 646 Ohm
Lead Channel Impedance Value: 665 Ohm
Lead Channel Impedance Value: 665 Ohm
Lead Channel Impedance Value: 722 Ohm
Lead Channel Impedance Value: 760 Ohm
Lead Channel Impedance Value: 817 Ohm
Lead Channel Pacing Threshold Amplitude: 0.625 V
Lead Channel Pacing Threshold Amplitude: 0.625 V
Lead Channel Pacing Threshold Amplitude: 1.25 V
Lead Channel Pacing Threshold Pulse Width: 0.4 ms
Lead Channel Pacing Threshold Pulse Width: 0.4 ms
Lead Channel Pacing Threshold Pulse Width: 0.4 ms
Lead Channel Sensing Intrinsic Amplitude: 1.875 mV
Lead Channel Sensing Intrinsic Amplitude: 1.875 mV
Lead Channel Sensing Intrinsic Amplitude: 11.875 mV
Lead Channel Sensing Intrinsic Amplitude: 11.875 mV
Lead Channel Setting Pacing Amplitude: 1.5 V
Lead Channel Setting Pacing Amplitude: 1.5 V
Lead Channel Setting Pacing Amplitude: 2 V
Lead Channel Setting Pacing Pulse Width: 0.4 ms
Lead Channel Setting Pacing Pulse Width: 0.4 ms
Lead Channel Setting Sensing Sensitivity: 0.3 mV

## 2022-07-03 NOTE — Progress Notes (Signed)
Remote ICD transmission.   

## 2022-07-04 ENCOUNTER — Other Ambulatory Visit: Payer: Medicare HMO

## 2022-07-15 ENCOUNTER — Other Ambulatory Visit: Payer: Self-pay | Admitting: Family Medicine

## 2022-07-19 DIAGNOSIS — Z0100 Encounter for examination of eyes and vision without abnormal findings: Secondary | ICD-10-CM | POA: Diagnosis not present

## 2022-07-19 DIAGNOSIS — H524 Presbyopia: Secondary | ICD-10-CM | POA: Diagnosis not present

## 2022-07-19 LAB — HM DIABETES EYE EXAM

## 2022-07-23 ENCOUNTER — Ambulatory Visit (INDEPENDENT_AMBULATORY_CARE_PROVIDER_SITE_OTHER): Payer: Medicare HMO

## 2022-07-23 VITALS — Wt 215.0 lb

## 2022-07-23 DIAGNOSIS — Z0000 Encounter for general adult medical examination without abnormal findings: Secondary | ICD-10-CM

## 2022-07-23 NOTE — Patient Instructions (Signed)
Philip Ponce , Thank you for taking time to come for your Medicare Wellness Visit. I appreciate your ongoing commitment to your health goals. Please review the following plan we discussed and let me know if I can assist you in the future.   Screening recommendations/referrals: Colonoscopy: 12/03/16 Recommended yearly ophthalmology/optometry visit for glaucoma screening and checkup Recommended yearly dental visit for hygiene and checkup  Vaccinations: Influenza vaccine: 08/08/21 Pneumococcal vaccine: 12/13/21 Tdap vaccine: 02/27/11, due if have injury Shingles vaccine: Shingrix 09/23/20, 03/31/21   Covid-19: 12/17/19, 01/11/20, 06/28/20, 12/13/21  Advanced directives: yes  Conditions/risks identified: no  Next appointment: Follow up in one year for your annual wellness visit 07/25/23 @ 8:15 am by phone  Preventive Care 40-64 Years, Male Preventive care refers to lifestyle choices and visits with your health care provider that can promote health and wellness. What does preventive care include? A yearly physical exam. This is also called an annual well check. Dental exams once or twice a year. Routine eye exams. Ask your health care provider how often you should have your eyes checked. Personal lifestyle choices, including: Daily care of your teeth and gums. Regular physical activity. Eating a healthy diet. Avoiding tobacco and drug use. Limiting alcohol use. Practicing safe sex. Taking low-dose aspirin every day starting at age 55. What happens during an annual well check? The services and screenings done by your health care provider during your annual well check will depend on your age, overall health, lifestyle risk factors, and family history of disease. Counseling  Your health care provider may ask you questions about your: Alcohol use. Tobacco use. Drug use. Emotional well-being. Home and relationship well-being. Sexual activity. Eating habits. Work and work  Statistician. Screening  You may have the following tests or measurements: Height, weight, and BMI. Blood pressure. Lipid and cholesterol levels. These may be checked every 5 years, or more frequently if you are over 41 years old. Skin check. Lung cancer screening. You may have this screening every year starting at age 39 if you have a 30-pack-year history of smoking and currently smoke or have quit within the past 15 years. Fecal occult blood test (FOBT) of the stool. You may have this test every year starting at age 73. Flexible sigmoidoscopy or colonoscopy. You may have a sigmoidoscopy every 5 years or a colonoscopy every 10 years starting at age 58. Prostate cancer screening. Recommendations will vary depending on your family history and other risks. Hepatitis C blood test. Hepatitis B blood test. Sexually transmitted disease (STD) testing. Diabetes screening. This is done by checking your blood sugar (glucose) after you have not eaten for a while (fasting). You may have this done every 1-3 years. Discuss your test results, treatment options, and if necessary, the need for more tests with your health care provider. Vaccines  Your health care provider may recommend certain vaccines, such as: Influenza vaccine. This is recommended every year. Tetanus, diphtheria, and acellular pertussis (Tdap, Td) vaccine. You may need a Td booster every 10 years. Zoster vaccine. You may need this after age 3. Pneumococcal 13-valent conjugate (PCV13) vaccine. You may need this if you have certain conditions and have not been vaccinated. Pneumococcal polysaccharide (PPSV23) vaccine. You may need one or two doses if you smoke cigarettes or if you have certain conditions. Talk to your health care provider about which screenings and vaccines you need and how often you need them. This information is not intended to replace advice given to you by your health care provider.  Make sure you discuss any questions you  have with your health care provider. Document Released: 10/07/2015 Document Revised: 05/30/2016 Document Reviewed: 07/12/2015 Elsevier Interactive Patient Education  2017 [place] Prevention in the Home Falls can cause injuries. They can happen to people of all ages. There are many things you can do to make your home safe and to help prevent falls. What can I do on the outside of my home? Regularly fix the edges of walkways and driveways and fix any cracks. Remove anything that might make you trip as you walk through a door, such as a raised step or threshold. Trim any bushes or trees on the path to your home. Use bright outdoor lighting. Clear any walking paths of anything that might make someone trip, such as rocks or tools. Regularly check to see if handrails are loose or broken. Make sure that both sides of any steps have handrails. Any raised decks and porches should have guardrails on the edges. Have any leaves, snow, or ice cleared regularly. Use sand or salt on walking paths during winter. Clean up any spills in your garage right away. This includes oil or grease spills. What can I do in the bathroom? Use night lights. Install grab bars by the toilet and in the tub and shower. Do not use towel bars as grab bars. Use non-skid mats or decals in the tub or shower. If you need to sit down in the shower, use a plastic, non-slip stool. Keep the floor dry. Clean up any water that spills on the floor as soon as it happens. Remove soap buildup in the tub or shower regularly. Attach bath mats securely with double-sided non-slip rug tape. Do not have throw rugs and other things on the floor that can make you trip. What can I do in the bedroom? Use night lights. Make sure that you have a light by your bed that is easy to reach. Do not use any sheets or blankets that are too big for your bed. They should not hang down onto the floor. Have a firm chair that has side arms. You can  use this for support while you get dressed. Do not have throw rugs and other things on the floor that can make you trip. What can I do in the kitchen? Clean up any spills right away. Avoid walking on wet floors. Keep items that you use a lot in easy-to-reach places. If you need to reach something above you, use a strong step stool that has a grab bar. Keep electrical cords out of the way. Do not use floor polish or wax that makes floors slippery. If you must use wax, use non-skid floor wax. Do not have throw rugs and other things on the floor that can make you trip. What can I do with my stairs? Do not leave any items on the stairs. Make sure that there are handrails on both sides of the stairs and use them. Fix handrails that are broken or loose. Make sure that handrails are as long as the stairways. Check any carpeting to make sure that it is firmly attached to the stairs. Fix any carpet that is loose or worn. Avoid having throw rugs at the top or bottom of the stairs. If you do have throw rugs, attach them to the floor with carpet tape. Make sure that you have a light switch at the top of the stairs and the bottom of the stairs. If you do not have them,  ask someone to add them for you. What else can I do to help prevent falls? Wear shoes that: Do not have high heels. Have rubber bottoms. Are comfortable and fit you well. Are closed at the toe. Do not wear sandals. If you use a stepladder: Make sure that it is fully opened. Do not climb a closed stepladder. Make sure that both sides of the stepladder are locked into place. Ask someone to hold it for you, if possible. Clearly mark and make sure that you can see: Any grab bars or handrails. First and last steps. Where the edge of each step is. Use tools that help you move around (mobility aids) if they are needed. These include: Canes. Walkers. Scooters. Crutches. Turn on the lights when you go into a dark area. Replace any light  bulbs as soon as they burn out. Set up your furniture so you have a clear path. Avoid moving your furniture around. If any of your floors are uneven, fix them. If there are any pets around you, be aware of where they are. Review your medicines with your doctor. Some medicines can make you feel dizzy. This can increase your chance of falling. Ask your doctor what other things that you can do to help prevent falls. This information is not intended to replace advice given to you by your health care provider. Make sure you discuss any questions you have with your health care provider. Document Released: 07/07/2009 Document Revised: 02/16/2016 Document Reviewed: 10/15/2014 Elsevier Interactive Patient Education  2017 [employer].

## 2022-07-23 NOTE — Progress Notes (Signed)
Virtual Visit via Telephone Note  I connected with  Philip Ponce on 07/23/22 at 11:30 AM EDT by telephone and verified that I am speaking with the correct person using two identifiers.  Location: Patient: home Provider: [place] Persons participating in the virtual visit: [place]   I discussed the limitations, risks, security and privacy concerns of performing an evaluation and management service by telephone and the availability of in person appointments. The patient expressed understanding and agreed to proceed.  Interactive audio and video telecommunications were attempted between this nurse and patient, however failed, due to patient having technical difficulties OR patient did not have access to video capability.  We continued and completed visit with audio only.  Some vital signs may be absent or patient reported.   Dionisio David, LPN  Subjective:   Philip Ponce is a 51 y.o. male who presents for Medicare Annual/Subsequent preventive examination.  Review of Systems     Cardiac Risk Factors include: advanced age (>4mn, >>27women);diabetes mellitus;dyslipidemia;hypertension;male gender     Objective:    There were no vitals filed for this visit. There is no height or weight on file to calculate BMI.     07/23/2022   11:36 AM 07/20/2021   12:11 PM 12/22/2020   11:24 AM 07/19/2020   12:05 PM 12/21/2019    9:50 AM 04/21/2019    2:13 PM 08/14/2018    9:45 AM  Advanced Directives  Does Patient Have a Medical Advance Directive? _0  Yes No  Type of AParamed[place]of [place]Living will [place] Park[place] will [place]Living will HH[place]Living will Living will Living will   Does patient want to make changes to medical advance directive? No - Patient declined Yes (MAU/Ambulatory/Procedural Areas - Information given)   No - Patient declined   No - Patient declined  Copy of [place]in Chart? Yes - validated most recent copy scanned in chart (See row information) Yes - validated most recent copy scanned in chart (See row information) No - copy requested Yes - validated most recent copy scanned in chart (See row information)     Would patient like information on creating a medical advance directive? No - Patient declined  No - Patient declined  No - Patient declined  No - Patient declined    Current Medications (verified) Outpatient Encounter Medications as of 07/23/2022  Medication Sig   Blood Glucose Monitoring Suppl (ONE TOUCH ULTRA 2) w/Device KIT Use to check blood sugar up to 2 times a day   calcium carbonate (OS-CAL) 600 MG TABS tablet Take 1,200 mg by mouth daily with breakfast.    Continuous Blood Gluc Sensor (DEXCOM G7 SENSOR) MISC by Does not apply route.   digoxin (LANOXIN) 0.125 MG tablet Take 1 tablet (125 mcg total) by mouth daily.   glucose blood (ONE TOUCH ULTRA TEST) test strip Check blood sugar up to 2 times a day and as instructed. Dx E11.9   Insulin Glargine (BASAGLAR KWIKPEN) 100 UNIT/ML INJECT 18 UNITS            SUBCUTANEOUSLY DAILY   Insulin Pen Needle (B-D ULTRAFINE III SHORT PEN) 31G X 8 MM MISC Use to inject insulin daily   Lancets (ONETOUCH ULTRASOFT) lancets Use to check blood sugar up to 2 times a day.   metFORMIN (GLUCOPHAGE-XR) 500 MG 24 hr tablet TAKE 1 TABLET DAILY WITH   BREAKFAST  metoprolol succinate (TOPROL-XL) 100 MG 24 hr tablet Take 1 tablet (100 mg total) by mouth daily. Take with or immediately following a meal.   Multiple Vitamins-Minerals (ADULT GUMMY PO) Take 2 tablets by mouth daily.   omeprazole (PRILOSEC) 40 MG capsule TAKE 1 CAPSULE DAILY AS    NEEDED FOR INDIGESTION   OVER THE COUNTER MEDICATION SEA MOSS SUPPLEMENT   sacubitril-valsartan (ENTRESTO) 97-103 MG Take 1 tablet by mouth 2 (two) times daily.   spironolactone (ALDACTONE) 25 MG tablet Take 1 tablet (25 mg  total) by mouth daily.   No facility-administered encounter medications on file as of 07/23/2022.    Allergies (verified) Mobic [meloxicam], Tramadol, and Diclofenac   History: Past Medical History:  Diagnosis Date   Acute blood loss anemia 11/20/2016   AICD (automatic cardioverter/defibrillator) present    a. 06/2017 s/p MDT DTMA 1QQ [place] MRI Quad CRT-D SureScan (ser # [id number]).   Chronic combined systolic (congestive) and diastolic (congestive) heart failure ([place])    a. 11/2016 Echo: EF 20-25%, glob HK, antsept, ant, apical HK, Gr1 DD, mild MR, mildly dil LA, nl RV fxn; b. 05/2017 Echo: Ef 20-25%, Gr2 DD, prominent apical trabeculations; c. 12/2017 Echo: EF 30-35%, Gr1 DD, glob HK.   COVID-19 03/08/2021   Diabetes mellitus ([place])    Essential hypertension    Family history of adverse reaction to anesthesia    mom had a hard time waking up   GERD (gastroesophageal reflux disease)    NICM (nonischemic cardiomyopathy) ([place])    a. 11/2016 Cath: nl cors, EF 25-30%; b. 11/2016 Echo: EF 20-25%; c. 05/2017 Echo: EF 20-25%; d. 12/2017 Echo: EF 30-35%, Gr1 DD.   Slipped intervertebral disc    L4 L5   STEMI (ST elevation myocardial infarction) ([place])    a. 11/2016 ST elevation -->Nl cors on cath.   Past Surgical History:  Procedure Laterality Date   BIV ICD INSERTION CRT-D  06/26/2017   BIV ICD INSERTION CRT-D N/A 06/26/2017   Procedure: BIV ICD INSERTION CRT-D;  Surgeon: [name], MD;  Location: [facility];  Service: Cardiovascular;  Laterality: N/A;   CERVICAL DISC ARTHROPLASTY N/A 07/24/2017   Procedure: CERVICAL ANTERIOR [place] ARTHROPLASTY C5-C7;  Surgeon: [name], MD;  Location: [facility];  Service: Neurosurgery;  Laterality: N/A;   COLONOSCOPY WITH PROPOFOL N/A 12/03/2016   Procedure: COLONOSCOPY WITH PROPOFOL;  Surgeon: [name], MD;  Location: [facility];  Service: Endoscopy;  Laterality: N/A;   ESOPHAGOGASTRODUODENOSCOPY (EGD) WITH PROPOFOL N/A  12/03/2016   Procedure: ESOPHAGOGASTRODUODENOSCOPY (EGD) WITH PROPOFOL;  Surgeon: [name], MD;  Location: [facility];  Service: Endoscopy;  Laterality: N/A;   FINGER SURGERY Right 2012   index finger   GASTRIC BYPASS     GIVENS CAPSULE STUDY N/A 01/07/2017   Procedure: GIVENS CAPSULE STUDY;  Surgeon: [name], MD;  Location: [facility];  Service: Endoscopy;  Laterality: N/A;   LEFT HEART CATH AND CORONARY ANGIOGRAPHY N/A 12/04/2016   Procedure: Left Heart Cath and Coronary Angiography;  Surgeon: [name], MD;  Location: [facility];  Service: Cardiovascular;  Laterality: N/A;   SHOULDER ARTHROSCOPY WITH OPEN ROTATOR CUFF REPAIR Left 10/04/2017   Procedure: SHOULDER ARTHROSCOPY WITH ROTATOR CUFF REPAIR, SUBACROMIAL DECOMPRESSION,OPEN BICEP TENODESIS, EXTENSIVE DEBRIDEMENT;  Surgeon: [name], MD;  Location: [facility];  Service: Orthopedics;  Laterality: Left;   Family History  Problem Relation Age of Onset   Diabetes Mother    Hypertension Mother    Cancer Mother  breast   Diabetes Father    Hypertension Father    Cancer Father        colon   Cancer Maternal Grandfather        prostate   Stroke Maternal Grandfather        CVA   Social History   Socioeconomic History   Marital status: Married    Spouse name: Not on file   Number of children: Not on file   Years of education: Not on file   Highest education level: Not on file  Occupational History   Occupation: Makes airplane filters, vault salesman    Employer: [employer]   Occupation: disability  Tobacco Use   Smoking status: Never   Smokeless tobacco: Never  Vaping Use   Vaping Use: Never used  Substance and Sexual Activity   Alcohol use: Not Currently   Drug use: No   Sexual activity: Yes  Other Topics Concern   Not on file  Social History Narrative   Regular exercise-yes, walking one mile per day   Diet: fast food, diet soda, unsweeted tea   Social Determinants of Health    Financial Resource Strain: Low Risk  (07/23/2022)   Overall Financial Resource Strain (CARDIA)    Difficulty of Paying Living Expenses: Not hard at all  Food Insecurity: No Food Insecurity (07/23/2022)   Hunger Vital Sign    Worried About Running Out of Food in the Last Year: Never true    [place] in the Last Year: Never true  Transportation Needs: No Transportation Needs (07/23/2022)   PRAPARE - Hydrologist (Medical): No    Lack of Transportation (Non-Medical): No  Physical Activity: Insufficiently Active (07/23/2022)   Exercise Vital Sign    Days of Exercise per Week: 3 days    Minutes of Exercise per Session: 30 min  Stress: No Stress Concern Present (07/23/2022)   [place]    Feeling of Stress : Only a little  Social Connections: Moderately Integrated (07/23/2022)   Social Connection and Isolation Panel [NHANES]    Frequency of Communication with Friends and Family: More than three times a week    Frequency of Social Gatherings with Friends and Family: Twice a week    Attends Religious Services: More than 4 times per year    Active Member of [employer] or Organizations: No    Attends Music therapist: Never    Marital Status: Married    Tobacco Counseling Counseling given: Not Answered   Clinical Intake:  Pre-visit preparation completed: Yes  Pain : No/denies pain     Nutritional Risks: None Diabetes: Yes CBG done?: No Did pt. bring in CBG monitor from home?: No  How often do you need to have someone help you when you read instructions, pamphlets, or other written materials from your doctor or pharmacy?: 1 - Never  Diabetic?yes Nutrition Risk Assessment:  Has the patient had any N/V/D within the last 2 months?  No  Does the patient have any non-healing wounds?  No  Has the patient had any unintentional weight loss or weight gain?  No    Diabetes:  Is the patient diabetic?  Yes  If diabetic, was a CBG obtained today?  No  Did the patient bring in their glucometer from home?  No  How often do you monitor your CBG's? occasionally   Financial Strains and Diabetes Management:  Are you having  any financial strains with the device, your supplies or your medication? No .  Does the patient want to be seen by Chronic Care Management for management of their diabetes?  No  Would the patient like to be referred to a Nutritionist or for Diabetic Management?  No   Diabetic Exams:  Diabetic Eye Exam: Completed 05/08/21. Overdue for diabetic eye exam. Pt has been advised about the importance in completing this exam.  Diabetic Foot Exam: Completed 05/01/21. Pt has been advised about the importance in completing this exam.   Interpreter Needed?: No  Information entered by :: Kirke Shaggy, LPN   Activities of Daily Living    07/23/2022   11:37 AM  In your present state of health, do you have any difficulty performing the following activities:  Hearing? 0  Vision? 0  Difficulty concentrating or making decisions? 0  Walking or climbing stairs? 0  Dressing or bathing? 0  Doing errands, shopping? 0  Preparing Food and eating ? N  Using the Toilet? N  In the past six months, have you accidently leaked urine? N  Do you have problems with loss of bowel control? N  Managing your Medications? N  Managing your Finances? N  Housekeeping or managing your Housekeeping? N    Patient Care Team: [name], MD as PCP - General [name], MD as PCP - Electrophysiology (Cardiology) End, Harrell Gave, MD as PCP - Cardiology (Cardiology) Alisa Graff, FNP as Nurse Practitioner (Family Medicine) Charlton Haws, [facility] as Pharmacist (Pharmacist)  Indicate any recent Medical Services you may have received from other than [facility] providers in the past year (date may be approximate).     Assessment:   This is a routine  wellness examination for [place].  Hearing/Vision screen Hearing Screening - Comments:: No aids Vision Screening - Comments:: Wears glasses- [place]  Dietary issues and exercise activities discussed: Current Exercise Habits: Home exercise routine, Type of exercise: walking, Time (Minutes): 40, Frequency (Times/Week): 4, Weekly Exercise (Minutes/Week): 160, Intensity: Mild   Goals Addressed             This Visit's Progress    DIET - EAT MORE FRUITS AND VEGETABLES        Depression Screen    07/23/2022   11:34 AM 07/20/2021   12:15 PM 07/19/2020   12:07 PM 02/18/2018    9:09 AM 11/19/2017    9:05 AM 09/10/2017    8:54 AM 08/05/2017    8:28 AM  PHQ 2/9 Scores  PHQ - 2 Score 0 0 0 0 0 0 0  PHQ- 9 Score 0  0        Fall Risk    07/23/2022   11:37 AM 07/20/2021   12:14 PM 07/19/2020   12:05 PM 08/19/2018    9:10 AM 02/18/2018    9:09 AM  Fall Risk   Falls in the past year? 0 0 0 0 No  Number falls in past yr: 0 0 0 0   Injury with Fall? 0 0 0 0   Risk for fall due to : No Fall Risks No Fall Risks Medication side effect    Follow up Falls prevention discussed;Falls evaluation completed Falls prevention discussed Falls evaluation completed;Falls prevention discussed      FALL RISK PREVENTION PERTAINING TO THE HOME:  Any stairs in or around the home? No  If so, are there any without handrails? No  Home free of loose throw rugs in walkways, pet  beds, electrical cords, etc? Yes  Adequate lighting in your home to reduce risk of falls? Yes   ASSISTIVE DEVICES UTILIZED TO PREVENT FALLS:  Life alert? No  Use of a cane, walker or w/c? No  Grab bars in the bathroom? Yes  Shower chair or bench in shower? No  Elevated toilet seat or a handicapped toilet? No    Cognitive Function:    07/19/2020   12:10 PM  MMSE - Mini Mental State Exam  Orientation to time 5  Orientation to Place 5  Registration 3  Attention/ Calculation 0   Attention/Calculation-comments can't spell well  Recall 2  Language- repeat 1        07/23/2022   11:39 AM  6CIT Screen  What Year? 0 points  What month? 0 points  What time? 0 points  Count back from 20 0 points  Months in reverse 0 points  Repeat phrase 0 points  Total Score 0 points    Immunizations Immunization History  Administered Date(s) Administered   Influenza, Seasonal, Injecte, Preservative Fre 10/07/2012, 09/28/2013, 10/19/2016   Influenza,inj,Quad PF,6+ Mos 07/19/2015, 06/27/2017, 07/15/2018, 07/23/2019, 07/26/2020, 08/08/2021   Influenza-Unspecified 07/25/2016   PFIZER(Purple Top)SARS-COV-2 Vaccination 12/17/2019, 01/11/2020, 06/28/2020   PNEUMOCOCCAL CONJUGATE-20 12/13/2021   Pfizer Covid-19 Vaccine Bivalent Booster 64yr & up 12/13/2021   Pneumococcal Polysaccharide-23 10/07/2012, 12/24/2017   Tdap 02/27/2011   Zoster Recombinat (Shingrix) 09/23/2020, 03/31/2021    TDAP status: Due, Education has been provided regarding the importance of this vaccine. Advised may receive this vaccine at local pharmacy or Health Dept. Aware to provide a copy of the vaccination record if obtained from local pharmacy or Health Dept. Verbalized acceptance and understanding.  Flu Vaccine status: Up to date  Pneumococcal vaccine status: Up to date  Covid-19 vaccine status: Completed vaccines  Qualifies for Shingles Vaccine? Yes   Zostavax completed No   Shingrix Completed?: Yes  Screening Tests Health Maintenance  Topic Date Due   TETANUS/TDAP  02/26/2021   Diabetic kidney evaluation - Urine ACR  07/19/2021   COLONOSCOPY (Pts 45-412yrInsurance coverage will need to be confirmed)  12/03/2021   COVID-19 Vaccine (5 - Pfizer risk series) 02/07/2022   INFLUENZA VACCINE  04/24/2022   FOOT EXAM  05/01/2022   OPHTHALMOLOGY EXAM  05/08/2022   Diabetic kidney evaluation - GFR measurement  08/08/2022   HIV Screening  08/08/2022 (Originally 05/19/1986)   HEMOGLOBIN A1C   08/09/2022   Medicare Annual Wellness (AWV)  07/24/2023   Hepatitis C Screening  Completed   Zoster Vaccines- Shingrix  Completed   HPV VACCINES  Aged Out    Health Maintenance  Health Maintenance Due  Topic Date Due   TETANUS/TDAP  02/26/2021   Diabetic kidney evaluation - Urine ACR  07/19/2021   COLONOSCOPY (Pts [id number] coverage will need to be confirmed)  12/03/2021   COVID-19 Vaccine (5 - Pfizer risk series) 02/07/2022   INFLUENZA VACCINE  04/24/2022   FOOT EXAM  05/01/2022   OPHTHALMOLOGY EXAM  05/08/2022   Diabetic kidney evaluation - GFR measurement  08/08/2022    Colorectal cancer screening: Type of screening: Colonoscopy. Completed 12/03/16. Repeat every 10 years  Lung Cancer Screening: (Low Dose CT Chest recommended if Age 60-41-80ars, 30 pack-year currently smoking OR have quit w/in 15years.) does not qualify.   Additional Screening:  Hepatitis C Screening: does qualify; Completed 07/19/20  Vision Screening: Recommended annual ophthalmology exams for early detection of glaucoma and other disorders of the eye. Is the patient up  to date with their annual eye exam?  Yes  Who is the provider or what is the name of the office in which the patient attends annual eye exams? Dr.Groat If pt is not established with a provider, would they like to be referred to a provider to establish care? No .   Dental Screening: Recommended annual dental exams for proper oral hygiene  Community Resource Referral / Chronic Care Management: CRR required this visit?  No   CCM required this visit?  No      Plan:     I have personally reviewed and noted the following in the patient's chart:   Medical and social history Use of alcohol, tobacco or illicit drugs  Current medications and supplements including opioid prescriptions. Patient is not currently taking opioid prescriptions. Functional ability and status Nutritional status Physical activity Advanced directives List  of other physicians Hospitalizations, surgeries, and ER visits in previous 12 months Vitals Screenings to include cognitive, depression, and falls Referrals and appointments  In addition, I have reviewed and discussed with patient certain preventive protocols, quality metrics, and best practice recommendations. A written personalized care plan for preventive services as well as general preventive health recommendations were provided to patient.     Dionisio David, LPN   28/20/8138   Nurse Notes: none

## 2022-08-02 ENCOUNTER — Telehealth: Payer: Self-pay | Admitting: Family Medicine

## 2022-08-02 DIAGNOSIS — E1169 Type 2 diabetes mellitus with other specified complication: Secondary | ICD-10-CM

## 2022-08-02 DIAGNOSIS — E119 Type 2 diabetes mellitus without complications: Secondary | ICD-10-CM

## 2022-08-02 NOTE — Telephone Encounter (Signed)
-----   Message from Ellamae Sia sent at 07/23/2022 12:01 PM EDT ----- Regarding: Lab orders for Friday, 11.10.23 Patient is scheduled for CPX labs, please order future labs, Thanks , Karna Christmas

## 2022-08-03 ENCOUNTER — Other Ambulatory Visit (INDEPENDENT_AMBULATORY_CARE_PROVIDER_SITE_OTHER): Payer: Medicare HMO

## 2022-08-03 DIAGNOSIS — Z794 Long term (current) use of insulin: Secondary | ICD-10-CM | POA: Diagnosis not present

## 2022-08-03 DIAGNOSIS — E119 Type 2 diabetes mellitus without complications: Secondary | ICD-10-CM | POA: Diagnosis not present

## 2022-08-03 LAB — COMPREHENSIVE METABOLIC PANEL
ALT: 12 U/L (ref 0–53)
AST: 13 U/L (ref 0–37)
Albumin: 3.9 g/dL (ref 3.5–5.2)
Alkaline Phosphatase: 55 U/L (ref 39–117)
BUN: 13 mg/dL (ref 6–23)
CO2: 33 mEq/L — ABNORMAL HIGH (ref 19–32)
Calcium: 8.7 mg/dL (ref 8.4–10.5)
Chloride: 105 mEq/L (ref 96–112)
Creatinine, Ser: 0.98 mg/dL (ref 0.40–1.50)
GFR: 88.22 mL/min (ref >60.00)
Glucose, Bld: 127 mg/dL — ABNORMAL HIGH (ref 70–99)
Potassium: 4.5 mEq/L (ref 3.5–5.1)
Sodium: 140 mEq/L (ref 135–145)
Total Bilirubin: 0.7 mg/dL (ref 0.2–1.2)
Total Protein: 7.1 g/dL (ref 6.0–8.3)

## 2022-08-03 LAB — LIPID PANEL
Cholesterol: 127 mg/dL (ref 0–200)
HDL: 65.7 mg/dL (ref >39.00)
LDL Cholesterol: 47 mg/dL (ref 0–99)
NonHDL: 60.86
Total CHOL/HDL Ratio: 2
Triglycerides: 71 mg/dL (ref 0.0–149.0)
VLDL: 14.2 mg/dL (ref 0.0–40.0)

## 2022-08-03 LAB — MICROALBUMIN / CREATININE URINE RATIO
Creatinine,U: 156.3 mg/dL
Microalb Creat Ratio: 1.1 mg/g (ref 0.0–30.0)
Microalb, Ur: 1.7 mg/dL (ref 0.0–1.9)

## 2022-08-03 LAB — HEMOGLOBIN A1C: Hgb A1c MFr Bld: 8.5 % — ABNORMAL HIGH (ref 4.6–6.5)

## 2022-08-03 NOTE — Progress Notes (Signed)
No critical labs need to be addressed urgently. We will discuss labs in detail at upcoming office visit.   

## 2022-08-07 ENCOUNTER — Other Ambulatory Visit: Payer: Self-pay

## 2022-08-07 MED ORDER — METOPROLOL SUCCINATE ER 100 MG PO TB24: 100.0000 mg | ORAL_TABLET | Freq: Every day | ORAL | 1 refills | Status: DC

## 2022-08-14 ENCOUNTER — Encounter: Payer: Medicare HMO | Admitting: Family Medicine

## 2022-08-22 ENCOUNTER — Encounter: Payer: Medicare HMO | Admitting: Family Medicine

## 2022-08-31 ENCOUNTER — Ambulatory Visit (INDEPENDENT_AMBULATORY_CARE_PROVIDER_SITE_OTHER): Payer: Medicare HMO | Admitting: Family Medicine

## 2022-08-31 ENCOUNTER — Encounter: Payer: Self-pay | Admitting: Family Medicine

## 2022-08-31 VITALS — BP 120/70 | HR 93 | Temp 99.7°F | Ht 71.25 in | Wt 214.2 lb

## 2022-08-31 DIAGNOSIS — M791 Myalgia, unspecified site: Secondary | ICD-10-CM | POA: Insufficient documentation

## 2022-08-31 DIAGNOSIS — R69 Illness, unspecified: Secondary | ICD-10-CM | POA: Diagnosis not present

## 2022-08-31 DIAGNOSIS — G720 Drug-induced myopathy: Secondary | ICD-10-CM

## 2022-08-31 DIAGNOSIS — T466X5A Adverse effect of antihyperlipidemic and antiarteriosclerotic drugs, initial encounter: Secondary | ICD-10-CM

## 2022-08-31 DIAGNOSIS — Z0000 Encounter for general adult medical examination without abnormal findings: Secondary | ICD-10-CM | POA: Diagnosis not present

## 2022-08-31 DIAGNOSIS — E1169 Type 2 diabetes mellitus with other specified complication: Secondary | ICD-10-CM | POA: Diagnosis not present

## 2022-08-31 DIAGNOSIS — Z125 Encounter for screening for malignant neoplasm of prostate: Secondary | ICD-10-CM

## 2022-08-31 DIAGNOSIS — I5042 Chronic combined systolic (congestive) and diastolic (congestive) heart failure: Secondary | ICD-10-CM

## 2022-08-31 DIAGNOSIS — Z794 Long term (current) use of insulin: Secondary | ICD-10-CM | POA: Diagnosis not present

## 2022-08-31 DIAGNOSIS — I152 Hypertension secondary to endocrine disorders: Secondary | ICD-10-CM

## 2022-08-31 DIAGNOSIS — E119 Type 2 diabetes mellitus without complications: Secondary | ICD-10-CM

## 2022-08-31 DIAGNOSIS — E785 Hyperlipidemia, unspecified: Secondary | ICD-10-CM | POA: Diagnosis not present

## 2022-08-31 DIAGNOSIS — F1010 Alcohol abuse, uncomplicated: Secondary | ICD-10-CM

## 2022-08-31 DIAGNOSIS — E1159 Type 2 diabetes mellitus with other circulatory complications: Secondary | ICD-10-CM

## 2022-08-31 DIAGNOSIS — I428 Other cardiomyopathies: Secondary | ICD-10-CM | POA: Diagnosis not present

## 2022-08-31 NOTE — Progress Notes (Signed)
Patient ID: Philip Ponce, male    DOB: [birth year], 51 y.o.   MRN: [id number]  This visit was conducted in person.  BP 120/70   Pulse 93   Temp 99.7 F (37.6 C) (Oral)   Ht 5' 11.25" (1.81 m)   Wt 214 lb 4 oz (97.2 kg)   SpO2 97%   BMI 29.67 kg/m    CC:  Chief Complaint  Patient presents with   Annual Exam    Part 2    Subjective:   HPI: Philip Ponce is a 51 y.o. male presenting on 08/31/2022 for Annual Exam (Part 2)  The patient presents for  complete physical and review of chronic health problems. He/She also has the following acute concerns today:none  The patient saw a LPN or RN for medicare wellness visit.  Prevention and wellness was reviewed in detail. Note reviewed and important notes copied below.   Chronic combined systolic and diastolic heart failure: Euvolemic in office today  Hypertension:  Well-controlled on Toprol-XL 100 mg p.o. daily, Entresto 97/103 mg daily and spironolactone 25 mg daily  BP Readings from Last 3 Encounters:  08/31/22 120/70  06/11/22 100/64  02/06/22 110/70  Using medication without problems or lightheadedness:  none Chest pain with exertion: Edema: none Short of breath: stable Average home BPs: Other issues:  Elevated Cholesterol: LDL at goal on no medication.. myalgia associated with statin Lab Results  Component Value Date   CHOL 127 08/03/2022   HDL 65.70 08/03/2022   LDLCALC 47 08/03/2022   TRIG 71.0 08/03/2022   CHOLHDL 2 08/03/2022  Using medications without problems: Muscle aches:  Diet compliance: Exercise: Other complaints: normal microalbiumin  Diabetes: Inadequate control despite metformin XR 500 mg daily and Basaglar  20 units daily   He has been having some depression lately.. had been drinking more alcohol  12 beers per day.. last time was 3 weeks ago.  He feel mood is better now.. not interested Lab Results  Component Value Date   HGBA1C 8.5 (H) 08/03/2022  Using medications without  difficulties: Hypoglycemic episodes: Hyperglycemic episodes: Feet problems: Blood Sugars averaging: Has CGM but not sure how to use it... has not been checking lately. eye exam within last year: yes  Wt Readings from Last 3 Encounters:  08/31/22 214 lb 4 oz (97.2 kg)  07/23/22 215 lb (97.5 kg)  06/11/22 215 lb (97.5 kg)     Patient Care Team: [name], MD as PCP - General [name], Ocie Doyne, MD as PCP - Electrophysiology (Cardiology) End, Harrell Gave, MD as PCP - Cardiology (Cardiology) Alisa Graff, FNP as Nurse Practitioner (Family Medicine) Charlton Haws, [facility] as Pharmacist (Pharmacist)    Relevant past medical, surgical, family and social history reviewed and updated as indicated. Interim medical history since our last visit reviewed. Allergies and medications reviewed and updated. Outpatient Medications Prior to Visit  Medication Sig Dispense Refill   Blood Glucose Monitoring Suppl (ONE TOUCH ULTRA 2) w/Device KIT Use to check blood sugar up to 2 times a day 1 kit 0   calcium carbonate (OS-CAL) 600 MG TABS tablet Take 1,200 mg by mouth daily with breakfast.      Continuous Blood Gluc Sensor (DEXCOM G7 SENSOR) MISC by Does not apply route.     digoxin (LANOXIN) 0.125 MG tablet Take 1 tablet (125 mcg total) by mouth daily. 30 tablet 3   glucose blood (ONE TOUCH ULTRA TEST) test strip Check blood sugar up to 2 times  a day and as instructed. Dx E11.9 100 each 5   Insulin Glargine (BASAGLAR KWIKPEN) 100 UNIT/ML INJECT 18 UNITS            SUBCUTANEOUSLY DAILY 15 mL 1   Insulin Pen Needle (B-D ULTRAFINE III SHORT PEN) 31G X 8 MM MISC Use to inject insulin daily 90 each 3   Lancets (ONETOUCH ULTRASOFT) lancets Use to check blood sugar up to 2 times a day. 100 each 5   metFORMIN (GLUCOPHAGE-XR) 500 MG 24 hr tablet TAKE 1 TABLET DAILY WITH   BREAKFAST 90 tablet 0   metoprolol succinate (TOPROL-XL) 100 MG 24 hr tablet Take 1 tablet (100 mg total) by mouth daily. Take  with or immediately following a meal. 90 tablet 1   Multiple Vitamins-Minerals (ADULT GUMMY PO) Take 2 tablets by mouth daily.     omeprazole (PRILOSEC) 40 MG capsule TAKE 1 CAPSULE DAILY AS    NEEDED FOR INDIGESTION 90 capsule 0   OVER THE COUNTER MEDICATION SEA MOSS SUPPLEMENT     sacubitril-valsartan (ENTRESTO) 97-103 MG Take 1 tablet by mouth 2 (two) times daily. 180 tablet 3   spironolactone (ALDACTONE) 25 MG tablet Take 1 tablet (25 mg total) by mouth daily. 90 tablet 3   No facility-administered medications prior to visit.     Per HPI unless specifically indicated in ROS section below Review of Systems  Constitutional:  Negative for fatigue and fever.  HENT:  Negative for ear pain.   Eyes:  Negative for pain.  Respiratory:  Negative for cough and shortness of breath.   Cardiovascular:  Negative for chest pain, palpitations and leg swelling.  Gastrointestinal:  Negative for abdominal pain.  Genitourinary:  Negative for dysuria.  Musculoskeletal:  Negative for arthralgias.  Neurological:  Negative for syncope, light-headedness and headaches.  Psychiatric/Behavioral:  Negative for dysphoric mood.    Objective:  BP 120/70   Pulse 93   Temp 99.7 F (37.6 C) (Oral)   Ht 5' 11.25" (1.81 m)   Wt 214 lb 4 oz (97.2 kg)   SpO2 97%   BMI 29.67 kg/m   Wt Readings from Last 3 Encounters:  08/31/22 214 lb 4 oz (97.2 kg)  07/23/22 215 lb (97.5 kg)  06/11/22 215 lb (97.5 kg)      Physical Exam Constitutional:      Appearance: He is well-developed.  HENT:     Head: Normocephalic.     Right Ear: Hearing normal.     Left Ear: Hearing normal.     Nose: Nose normal.  Neck:     Thyroid: No thyroid mass or thyromegaly.     Vascular: No carotid bruit.     Trachea: Trachea normal.  Cardiovascular:     Rate and Rhythm: Normal rate and regular rhythm.     Pulses: Normal pulses.     Heart sounds: Heart sounds not distant. No murmur heard.    No friction rub. No gallop.     Comments:  No peripheral edema Pulmonary:     Effort: Pulmonary effort is normal. No respiratory distress.     Breath sounds: Normal breath sounds.  Skin:    General: Skin is warm and dry.     Findings: No rash.  Psychiatric:        Speech: Speech normal.        Behavior: Behavior normal.        Thought Content: Thought content normal.       Results for orders placed or  performed in visit on 08/03/22  Microalbumin / creatinine urine ratio  Result Value Ref Range   Microalb, Ur 1.7 0.0 - 1.9 mg/dL   Creatinine,U 156.3 mg/dL   Microalb Creat Ratio 1.1 0.0 - 30.0 mg/g  Comprehensive metabolic panel  Result Value Ref Range   Sodium 140 135 - 145 mEq/L   Potassium 4.5 3.5 - 5.1 mEq/L   Chloride 105 96 - 112 mEq/L   CO2 33 (H) 19 - 32 mEq/L   Glucose, Bld 127 (H) 70 - 99 mg/dL   BUN 13 6 - 23 mg/dL   Creatinine, Ser 0.98 0.40 - 1.50 mg/dL   Total Bilirubin 0.7 0.2 - 1.2 mg/dL   Alkaline Phosphatase 55 39 - 117 U/L   AST 13 0 - 37 U/L   ALT 12 0 - 53 U/L   Total Protein 7.1 6.0 - 8.3 g/dL   Albumin 3.9 3.5 - 5.2 g/dL   GFR 88.22 >60.00 mL/min   Calcium 8.7 8.4 - 10.5 mg/dL  Lipid panel  Result Value Ref Range   Cholesterol 127 0 - 200 mg/dL   Triglycerides 71.0 0.0 - 149.0 mg/dL   HDL 65.70 >39.00 mg/dL   VLDL 14.2 0.0 - 40.0 mg/dL   LDL Cholesterol 47 0 - 99 mg/dL   Total CHOL/HDL Ratio 2    NonHDL 60.86   Hemoglobin A1c  Result Value Ref Range   Hgb A1c MFr Bld 8.5 (H) 4.6 - 6.5 %     COVID 19 screen:  No recent travel or known exposure to COVID19 The patient denies respiratory symptoms of COVID 19 at this time. The importance of social distancing was discussed today.   Assessment and Plan   The patient's preventative maintenance and recommended screening tests for an annual wellness exam were reviewed in full today. Brought up to date unless services declined.  Counselled on the importance of diet, exercise, and its role in overall health and mortality. The patient's FH  and SH was reviewed, including their home life, tobacco status, and drug and alcohol status.   Vaccines: 4 x COVID vaccine, tdap due but ot covered, PNA uptodate, given flu Prostate Cancer Screen: Due for re-eval. Colon Cancer Screen:  2018 due for repeat in 5 years. DUE      Smoking Status:nonsmoker ETOH/ drug use: none/none  Hep C:  done  HIV screen:   refused   Routine general medical examination at a health care facility  Chronic combined systolic and diastolic heart failure ([place]) Assessment & Plan:  Euvolemic in office  Today.  Entresto 97/103 mg daily   aldactone 25 mg daily dogoxin 0.125 mg daily  coreg 12.5 mg BID   Hypertension associated with diabetes ([place]) Assessment & Plan: Stable, chronic.  Continue current medication.   Entresto 97/103 mg daily   aldactone 25 mg daily dogoxin 0.125 mg daily  coreg 12.5 mg BID   Non-ischemic cardiomyopathy (HCC)  Hyperlipidemia associated with type 2 diabetes mellitus ([place]) Assessment & Plan: LDL at goal on no medication.. myalgia associated with statin   Type 2 diabetes mellitus without complication, with long-term current use of insulin (HCC) Assessment & Plan: Chronic, Recent inadequate control with poorly controlled A1c likely due to increased alcohol use.  He has stopped this now and is back on track with lifestyle changes. Continue metformin and Balsalglar for now.  If blood sugar is not improving we can consider Jardiance addition  You can discuss addition of Jardiance with cardiologist . Set up  yearly eye exam for diabetes and have the opthalmologist send [place] a copy of the evaluation for the chart.\ Given information on how to contact pharmacist to learn how to use continuous glucose monitor.  Orders: -     Comprehensive metabolic panel; Future -     Hemoglobin A1c; Future  Statin myopathy  Prostate cancer screening -     PSA, Medicare; Future  Alcohol abuse Assessment & Plan: Discussed complications  alcohol abuse can cause with his current health issues including worsening cardiomyopathy and diabetes.  He reports he has not had alcohol in the last 3 weeks.  He feels his mood is doing much better now     Eliezer Lofts, MD

## 2022-08-31 NOTE — Patient Instructions (Addendum)
Set up appt with Mendel Ryder  to learn how to set up continuous glucose monitor.  Get flu  and COVID shot when current cold is over. Call to set up repeat colonoscopy: [facility]  [phone]  Work on stress reduction and relaxation techniques, avoid alcohol and carbohydrates.  Call if mood not continuing to improve.

## 2022-09-03 ENCOUNTER — Telehealth: Payer: Self-pay

## 2022-09-03 NOTE — Progress Notes (Signed)
Message left for the patient to return my call for scheduling of CGM training with Mendel Ryder. Patient returned my call and scheduled appointment for CGM training.  [name], CPP notified  Avel Sensor, [place]  [phone]

## 2022-09-04 ENCOUNTER — Telehealth: Payer: Self-pay | Admitting: Family Medicine

## 2022-09-04 DIAGNOSIS — Z1211 Encounter for screening for malignant neoplasm of colon: Secondary | ICD-10-CM

## 2022-09-04 DIAGNOSIS — E7800 Pure hypercholesterolemia, unspecified: Secondary | ICD-10-CM | POA: Diagnosis not present

## 2022-09-04 DIAGNOSIS — E119 Type 2 diabetes mellitus without complications: Secondary | ICD-10-CM | POA: Diagnosis not present

## 2022-09-04 DIAGNOSIS — Z794 Long term (current) use of insulin: Secondary | ICD-10-CM | POA: Diagnosis not present

## 2022-09-04 DIAGNOSIS — I428 Other cardiomyopathies: Secondary | ICD-10-CM | POA: Diagnosis not present

## 2022-09-04 DIAGNOSIS — I5020 Unspecified systolic (congestive) heart failure: Secondary | ICD-10-CM | POA: Diagnosis not present

## 2022-09-04 DIAGNOSIS — G4733 Obstructive sleep apnea (adult) (pediatric): Secondary | ICD-10-CM | POA: Diagnosis not present

## 2022-09-04 DIAGNOSIS — I10 Essential (primary) hypertension: Secondary | ICD-10-CM | POA: Diagnosis not present

## 2022-09-04 DIAGNOSIS — Z95810 Presence of automatic (implantable) cardiac defibrillator: Secondary | ICD-10-CM | POA: Diagnosis not present

## 2022-09-04 NOTE — Telephone Encounter (Signed)
Lisa notified by telephone that GI referral has been ordered by Dr. [name] as requested.

## 2022-09-04 NOTE — Telephone Encounter (Signed)
Patient wife Lattie Haw called in and stated that patient will have to go back to [facility] for repeat colonoscopy because he went there in 2018. She stated that they need a referral before they are able to schedule him. The referral can be sent over to them by fax at [phone]. Please advise. Thank you!

## 2022-09-04 NOTE — Addendum Note (Signed)
Addended byEliezer Lofts E on: 09/04/2022 01:08 PM   Modules accepted: Orders

## 2022-09-04 NOTE — Telephone Encounter (Signed)
Call Let patient know that referral has been sent.

## 2022-09-06 ENCOUNTER — Other Ambulatory Visit: Payer: Self-pay | Admitting: *Deleted

## 2022-09-06 ENCOUNTER — Encounter: Payer: Self-pay | Admitting: Oncology

## 2022-09-06 ENCOUNTER — Encounter: Payer: Self-pay | Admitting: Family Medicine

## 2022-09-06 ENCOUNTER — Telehealth: Payer: Self-pay | Admitting: *Deleted

## 2022-09-06 DIAGNOSIS — Z1211 Encounter for screening for malignant neoplasm of colon: Secondary | ICD-10-CM

## 2022-09-06 MED ORDER — NA SULFATE-K SULFATE-MG SULF 17.5-3.13-1.6 GM/177ML PO SOLN: 1.0000 | Freq: Once | ORAL | 0 refills | Status: AC

## 2022-09-06 NOTE — Telephone Encounter (Signed)
Gastroenterology Pre-Procedure Review  Request Date: 09/25/2022 Requesting Physician: [name]  PATIENT REVIEW QUESTIONS: The patient responded to the following health history questions as indicated:    1. Are you having any GI issues? no 2. Do you have a personal history of Polyps? no 3. Do you have a family history of Colon Cancer or Polyps? yes (father had colon cancer) 4. Diabetes Mellitus? yes (type 2, taking ) 5. Joint replacements in the past 12 months?no 6. Major health problems in the past 3 months?no 7. Any artificial heart valves, MVP, or defibrillator?yes (defibrillator)    MEDICATIONS & ALLERGIES:    Patient reports the following regarding taking any anticoagulation/antiplatelet therapy:   Plavix, Coumadin, Eliquis, Xarelto, Lovenox, Pradaxa, Brilinta, or Effient? no Aspirin? no  Patient confirms/reports the following medications:  Current Outpatient Medications  Medication Sig Dispense Refill   Na Sulfate-K Sulfate-Mg Sulf 17.5-3.13-1.6 GM/177ML SOLN Take 1 kit by mouth once for 1 dose. 354 mL 0   Blood Glucose Monitoring Suppl (ONE TOUCH ULTRA 2) w/Device KIT Use to check blood sugar up to 2 times a day 1 kit 0   calcium carbonate (OS-CAL) 600 MG TABS tablet Take 1,200 mg by mouth daily with breakfast.      Continuous Blood Gluc Sensor (DEXCOM G7 SENSOR) MISC by Does not apply route.     digoxin (LANOXIN) 0.125 MG tablet Take 1 tablet (125 mcg total) by mouth daily. 30 tablet 3   glucose blood (ONE TOUCH ULTRA TEST) test strip Check blood sugar up to 2 times a day and as instructed. Dx E11.9 100 each 5   Insulin Glargine (BASAGLAR KWIKPEN) 100 UNIT/ML INJECT 18 UNITS            SUBCUTANEOUSLY DAILY 15 mL 1   Insulin Pen Needle (B-D ULTRAFINE III SHORT PEN) 31G X 8 MM MISC Use to inject insulin daily 90 each 3   Lancets (ONETOUCH ULTRASOFT) lancets Use to check blood sugar up to 2 times a day. 100 each 5   metFORMIN (GLUCOPHAGE-XR) 500 MG 24 hr tablet TAKE 1 TABLET DAILY  WITH   BREAKFAST 90 tablet 0   metoprolol succinate (TOPROL-XL) 100 MG 24 hr tablet Take 1 tablet (100 mg total) by mouth daily. Take with or immediately following a meal. 90 tablet 1   Multiple Vitamins-Minerals (ADULT GUMMY PO) Take 2 tablets by mouth daily.     omeprazole (PRILOSEC) 40 MG capsule TAKE 1 CAPSULE DAILY AS    NEEDED FOR INDIGESTION 90 capsule 0   OVER THE COUNTER MEDICATION SEA MOSS SUPPLEMENT     sacubitril-valsartan (ENTRESTO) 97-103 MG Take 1 tablet by mouth 2 (two) times daily. 180 tablet 3   spironolactone (ALDACTONE) 25 MG tablet Take 1 tablet (25 mg total) by mouth daily. 90 tablet 3   No current facility-administered medications for this visit.    Patient confirms/reports the following allergies:  Allergies  Allergen Reactions   Mobic [Meloxicam] Other (See Comments)    Ulcers in stomach eruption    Tramadol     Hard to breath   Diclofenac Palpitations and Other (See Comments)    Fast heart beat, CP,SOB and weakness on one side.    No orders of the defined types were placed in this encounter.   AUTHORIZATION INFORMATION Primary Insurance: 1D#: Group #:  Secondary Insurance: 1D#: Group #:  SCHEDULE INFORMATION: Date: 09/25/2022 Time: Location: [facility]

## 2022-09-07 ENCOUNTER — Ambulatory Visit: Payer: Medicare HMO | Admitting: Pharmacist

## 2022-09-07 DIAGNOSIS — E1169 Type 2 diabetes mellitus with other specified complication: Secondary | ICD-10-CM

## 2022-09-07 DIAGNOSIS — E119 Type 2 diabetes mellitus without complications: Secondary | ICD-10-CM

## 2022-09-07 NOTE — Progress Notes (Unsigned)
Chronic Care Management Pharmacy Note  09/13/2022 Name:  Philip Ponce MRN:  [id number] DOB:  1971-06-11  Summary: CCM F/U visit Patient presented for [place] training.  Demonstrated with teach-back: -components of sensor and how to prepare sensor for application -how to apply sensor to arm. Patient successfully applied sensor to R arm today -how to navigate Dexcom app in order to pair sensor; sensor was successfully paired today -how to adjust high/low sugar alarms. Low alarm set to 70 and high alarm set to 300 today. -App users: demonstrated navigating various components including settings, logbook, and daily patterns  Educated patient on: -viewing reader at any time to view real-time glucose levels (new reading every 5 minutes) -changing sensor every 10 days or as initiated by app/reader -removing sensor prior to MRI, CT scans -difference between blood glucose and "sensor" (interstitial fluid) glucose -if glucose readings ever seem wrong/questionable, check sugar with a finger stick -Dexcom Clarity cloud monitoring: Patient did enroll in Clarity  Provided customer service line for Dexcom in case of sensor breakdowns: [phone]   Pt voiced understanding of above and denies further questions.  Recommendations/Changes made from today's visit: -No med changes; f/u 1 month  Plan: -Pharmacist follow up televisit scheduled for 1 month -PCP appt 11/30/22; colonoscopy 09/25/22    Subjective: Philip Ponce is an 51 y.o. year old male who is a primary patient of Bedsole, Amy E, MD.  The CCM team was consulted for assistance with disease management and care coordination needs.    Engaged with patient by telephone for follow up visit in response to provider referral for pharmacy case management and/or care coordination services.   Consent to Services:  The patient was given information about Chronic Care Management services, agreed to services, and gave verbal consent  prior to initiation of services.  Please see initial visit note for detailed documentation.   Patient Care Team: [name], MD as PCP - General [name], MD as PCP - Electrophysiology (Cardiology) End, Harrell Gave, MD as PCP - Cardiology (Cardiology) Alisa Graff, FNP as Nurse Practitioner (Family Medicine) Charlton Haws, [facility] as Pharmacist (Pharmacist)  Patient lives at home with wife and teenage daughter. He is on disability for cardiomyopathy.  Recent office visits: 08/31/22 Dr [name] OV: annual - set up appt for [facility] training. Call for colonoscopy.   02/06/22 Dr [name] OV: f/u DM. A1c 8.3%; referred for CGM. Discuss Jardiance with cardiology.  08/08/21-PCP-Amy Bedsole,MD-Patient presented for AWV. Labs ordered( diabetes at goal, normal range labs) screenings discussed, vaccinations discussed,flu shot given, referral for Urology (vasctomy evaluation); no med changes.  05/12/21-PCP-Amy Bedsole,MD-Patient presented for left foot pain.Start gabapentin 100 mg at bedtime for likely diabetic neuropathy vs sciatica pain Hold Melatonin,consider checking B12 and Thyroid   05/05/21-PCP-Amy Bedsole,MD-Patient presented for follow up diabetes-A1C worse, track BG's and follow up 2 weeks, may have to change medications.  Recent consult visits: 09/04/22 Dr Dema Severin (Cardiology): HF - scheduled repeat ECHO. 06/11/22 Dr Curt Bears (Cardiology EP): HF; stop carvedilol, start Metoprolol succinate 100 mg 05/03/22 Dr Dema Severin (Cardiology): f/u HF, consider SGLT2 at next visit. 8/93/81 NP Delicia White (Cardiology): f/u HF. Consider SGLT2i at next visit 0/17/51 NP Delicia White (Cardiology): f/u HF. Digoxin level today.  06/13/21-Optometry-Robert Dickey-eye exam normal. 0/25/85 NP Delicia White (Cardiology VV): f/u recent ECHO. EF 40-50%, continue current mgmt.  03/31/21-Cardiology- no data found   Hospital visits: [facility]/17/[facility] ED- Patient presented for lumbar back pain and dark  urine concerns. He is positive  covid-19.Patient left [facility]  03/10/21-[facility]-Patient presented for nasal congestion,positive covid-19, and dark urine.UA (abnormal)-increase  oral fluid intake with goal of 64-128 ounces of water daily   Objective:  Lab Results  Component Value Date   CREATININE 0.98 08/03/2022   BUN 13 08/03/2022   GFR 88.22 08/03/2022   GFRNONAA >60 02/12/2019   GFRAA >60 02/12/2019   NA 140 08/03/2022   K 4.5 08/03/2022   CALCIUM 8.7 08/03/2022   CO2 33 (H) 08/03/2022   GLUCOSE 127 (H) 08/03/2022    Lab Results  Component Value Date/Time   HGBA1C 8.5 (H) 08/03/2022 07:52 AM   HGBA1C 8.3 (A) 02/06/2022 11:18 AM   HGBA1C 5.8 08/08/2021 03:05 PM   HGBA1C 8.4 (H) 08/04/2012 07:54 AM   GFR 88.22 08/03/2022 07:52 AM   GFR 83.69 08/08/2021 03:05 PM   MICROALBUR 1.7 08/03/2022 07:52 AM   MICROALBUR 1.6 07/19/2020 08:31 AM    Last diabetic Eye exam:  Lab Results  Component Value Date/Time   HMDIABEYEEXA No Retinopathy 07/19/2022 12:00 AM    Last diabetic Foot exam:  Lab Results  Component Value Date/Time   HMDIABFOOTEX done 05/01/2021 12:00 AM     Lab Results  Component Value Date   CHOL 127 08/03/2022   HDL 65.70 08/03/2022   LDLCALC 47 08/03/2022   TRIG 71.0 08/03/2022   CHOLHDL 2 08/03/2022       Latest Ref Rng & Units 08/03/2022    7:52 AM 08/08/2021    3:05 PM 07/19/2020    8:31 AM  Hepatic Function  Total Protein 6.0 - 8.3 g/dL 7.1  7.5  6.9   Albumin 3.5 - 5.2 g/dL 3.9  4.0  3.8   AST 0 - 37 U/L _0 ALT 0 - 53 U/L _1 Alk Phosphatase 39 - 117 U/L 55  59  75   Total Bilirubin 0.2 - 1.2 mg/dL 0.7  0.4  0.6     Lab Results  Component Value Date/Time   TSH 0.61 08/08/2021 03:05 PM   TSH 1.019 11/30/2016 08:32 AM   TSH 0.549 08/04/2012 07:54 AM       Latest Ref Rng & Units 12/22/2020   11:09 AM 06/22/2020   10:45 AM 12/18/2019   11:44 AM  CBC  WBC 4.0 - 10.5 K/uL 4.1  5.2  4.3   Hemoglobin 13.0 -  17.0 g/dL 11.8  11.4  12.4   Hematocrit 39.0 - 52.0 % 36.2  34.3  38.8   Platelets 150 - 400 K/uL 190  257  217     No results found for: "VD25OH"  Clinical ASCVD: Yes  The ASCVD Risk score (Arnett DK, et al., 2019) failed to calculate for the following reasons:   The patient has a prior MI or stroke diagnosis       07/23/2022   11:34 AM 07/20/2021   12:15 PM 07/19/2020   12:07 PM  Depression screen PHQ 2/9  Decreased Interest 0 0 0  Down, Depressed, Hopeless 0 0 0  PHQ - 2 Score 0 0 0  Altered sleeping 0  0  Tired, decreased energy 0  0  Change in appetite 0  0  Feeling bad or failure about yourself  0  0  Trouble concentrating 0  0  Moving slowly or fidgety/restless 0  0  Suicidal thoughts 0  0  PHQ-9 Score 0  0  Difficult doing work/chores Not  difficult at all  Not difficult at all     Social History   Tobacco Use  Smoking Status Never  Smokeless Tobacco Never   BP Readings from Last 3 Encounters:  08/31/22 120/70  06/11/22 100/64  02/06/22 110/70   Pulse Readings from Last 3 Encounters:  08/31/22 93  06/11/22 (!) 59  02/06/22 67   Wt Readings from Last 3 Encounters:  08/31/22 214 lb 4 oz (97.2 kg)  07/23/22 215 lb (97.5 kg)  06/11/22 215 lb (97.5 kg)   BMI Readings from Last 3 Encounters:  08/31/22 29.67 kg/m  07/23/22 29.99 kg/m  06/11/22 29.99 kg/m    Assessment/Interventions: Review of patient past medical history, allergies, medications, health status, including review of consultants reports, laboratory and other test data, was performed as part of comprehensive evaluation and provision of chronic care management services.   SDOH:  (Social Determinants of Health) assessments and interventions performed: No SDOH Interventions    Flowsheet Row Clinical Support from 07/23/2022 in [place] at [place] from 07/19/2020 in [place] at [place] Interventions  Intervention Not Indicated --  Housing Interventions Intervention Not Indicated --  Transportation Interventions Intervention Not Indicated --  Utilities Interventions Intervention Not Indicated --  Alcohol Usage Interventions Intervention Not Indicated (Score <7) --  Depression Interventions/Treatment  -- JJH4-1 Score <4 Follow-up Not Indicated  Financial Strain Interventions Intervention Not Indicated --  Physical Activity Interventions Intervention Not Indicated --  Stress Interventions Intervention Not Indicated --  Social Connections Interventions Intervention Not Indicated --      SDOH Screenings   Food Insecurity: No Food Insecurity (07/23/2022)  Housing: Low Risk  (07/23/2022)  Transportation Needs: No Transportation Needs (07/23/2022)  Utilities: Not At Risk (07/23/2022)  Alcohol Screen: Low Risk  (07/23/2022)  Depression (PHQ2-9): Low Risk  (07/23/2022)  Financial Resource Strain: Low Risk  (07/23/2022)  Physical Activity: Insufficiently Active (07/23/2022)  Social Connections: Moderately Integrated (07/23/2022)  Stress: No Stress Concern Present (07/23/2022)  Tobacco Use: Low Risk  (08/31/2022)    CCM Care Plan  Allergies  Allergen Reactions   Mobic [Meloxicam] Other (See Comments)    Ulcers in stomach eruption    Tramadol     Hard to breath   Diclofenac Palpitations and Other (See Comments)    Fast heart beat, CP,SOB and weakness on one side.    Medications Reviewed Today     Reviewed by Carter Kitten, CMA (Certified Medical Assistant) on 08/31/22 at [phone]  Med List Status: <None>   Medication Order Taking? Sig Documenting Provider Last Dose Status Informant  Blood Glucose Monitoring Suppl (ONE TOUCH ULTRA 2) w/Device KIT [id number]  Use to check blood sugar up to 2 times a day Bedsole, Amy E, MD  Active   calcium carbonate (OS-CAL) 600 MG TABS tablet [id number]  Take 1,200 mg by mouth daily with breakfast.  [provider]  Active Self  Continuous  Blood Gluc Sensor ([place]) [place] [id number]  by Does not apply route. [provider]  Active   digoxin (LANOXIN) 0.125 MG tablet [id number]  Take 1 tablet (125 mcg total) by mouth daily. [name], MD  Active   glucose blood (ONE TOUCH ULTRA TEST) test strip [id number]  Check blood sugar up to 2 times a day and as instructed. Dx E11.9 [name], MD  Active   Insulin Glargine [facility] KWIKPEN) 100 UNIT/ML [id number]  INJECT 18 UNITS  SUBCUTANEOUSLY DAILY Bedsole, Amy E, MD  Active   Insulin Pen Needle (B-D ULTRAFINE III SHORT PEN) 31G X 8 MM MISC [id number]  Use to inject insulin daily Bedsole, Amy E, MD  Active   Lancets [facility] ULTRASOFT) lancets [id number]  Use to check blood sugar up to 2 times a day. Bedsole, Amy E, MD  Active   metFORMIN (GLUCOPHAGE-XR) 500 MG 24 hr tablet [id number]  TAKE 1 TABLET DAILY WITH   BREAKFAST Bedsole, Amy E, MD  Active   metoprolol succinate (TOPROL-XL) 100 MG 24 hr tablet [id number]  Take 1 tablet (100 mg total) by mouth daily. Take with or immediately following a meal. [name], [name], MD  Active   Multiple Vitamins-Minerals (ADULT GUMMY PO) [id number]  Take 2 tablets by mouth daily. [provider]  Active Self  omeprazole (PRILOSEC) 40 MG capsule [id number]  TAKE 1 CAPSULE DAILY AS    NEEDED FOR Dia Sitter, MD  Active   OVER THE COUNTER MEDICATION [id number]  [place] SUPPLEMENT [provider]  Active   sacubitril-valsartan (ENTRESTO) 97-103 MG [id number]  Take 1 tablet by mouth 2 (two) times daily. [name], MD  Active   spironolactone (ALDACTONE) 25 MG tablet [id number]  Take 1 tablet (25 mg total) by mouth daily. [name], MD  Active             Patient Active Problem List   Diagnosis Date Noted   Myalgia due to statin 08/31/2022   Sprain of right thumb 02/06/2022   Left foot pain 05/12/2021   Numbness of left foot 05/12/2021   COVID-19 virus infection 03/08/2021    Bilateral chronic knee pain 05/30/2018   Status post biventricular pacemaker 10/09/2017   S/P left rotator cuff repair 10/04/2017   Cervical radiculopathy 07/24/2017   Rotator cuff tendonitis, left 05/06/2017   Chronic combined systolic and diastolic heart failure ([place]) 12/18/2016   Non-ischemic cardiomyopathy ([place]) 12/04/2016   Iron deficiency anemia    History of bariatric surgery 11/20/2016   Toenail fungus 10/14/2015   RBBB 04/07/2015   LBBB (left bundle branch block) 09/24/2013   Obstructive apnea 09/24/2013   GERD 09/29/2010   ERECTILE DYSFUNCTION, 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   Hypertension associated with diabetes ([place]) 07/09/2007   Hyperlipidemia associated with type 2 diabetes mellitus ([place]) 06/12/2007   Type 2 diabetes mellitus without complication, with long-term current use of insulin ([place]) 06/04/2007    Immunization History  Administered Date(s) Administered   Influenza, Seasonal, Injecte, Preservative Fre 10/07/2012, 09/28/2013, 10/19/2016   Influenza,inj,Quad PF,6+ Mos 07/19/2015, 06/27/2017, 07/15/2018, 07/23/2019, 07/26/2020, 08/08/2021   Influenza-Unspecified 07/25/2016   PFIZER(Purple Top)SARS-COV-2 Vaccination 12/17/2019, 01/11/2020, 06/28/2020   PNEUMOCOCCAL CONJUGATE-20 12/13/2021   Pfizer Covid-19 Vaccine Bivalent Booster 82yr & up 12/13/2021   Pneumococcal Polysaccharide-23 10/07/2012, 12/24/2017   Tdap 02/27/2011   Zoster Recombinat (Shingrix) 09/23/2020, 03/31/2021    Conditions to be addressed/monitored:  Hypertension, Hyperlipidemia, Diabetes, Heart Failure, and GERD  Care [place] : [place] Updates made by FCharlton Haws [place] 09/13/2022 12:00 AM     Problem: Hypertension, Hyperlipidemia, Diabetes, Heart Failure, and GERD   Priority: High     Long-Range Goal: Disease mgmt   Start Date: 09/14/2021  Expected End Date: 09/14/2022  This Visit's Progress: On track  Recent Progress: On track  Priority: High  Note:   Current  Barriers:  Unable to independently monitor therapeutic efficacy  Pharmacist Clinical Goal(s):  Patient will achieve adherence to monitoring guidelines and  medication adherence to achieve therapeutic efficacy through collaboration with PharmD and provider.   Interventions: 1:1 collaboration with [name], MD regarding development and update of comprehensive plan of care as evidenced by provider attestation and co-signature Inter-disciplinary care team collaboration (see longitudinal plan of care) Comprehensive medication review performed; medication list updated in electronic medical record  Hypertension / Heart Failure (BP goal <130/80) -Controlled -Last ejection fraction: 40-50% (Date: 03/07/21) (improved from 30-35%) -HF type: HFmrEF (mildly reduced EF 41-49%) NYHA Class: II (slight limitation of activity) -s/p BIV ICD -Current home BP readings: not checking; Denies hypotensive/hypertensive symptoms including dizziness -Daily weight: 213 -Current treatment: Carvedilol 12.5 mg BID - Appropriate, Effective, Safe, Accessible Digoxin 0.125 mg daily -Appropriate, Effective, Safe, Accessible Entresto 97-103 mg BID -Appropriate, Effective, Safe, Accessible Spironolactone 25 mg daily -Appropriate, Effective, Safe, Accessible -Medications previously tried: furosemide, isosorbide  -Educated on BP goals and benefits of medications for prevention of heart attack, stroke and kidney damage;Exercise goal of 150 minutes per week; -Educated on Benefits of medications for managing symptoms and prolonging life -Counseled to monitor BP at home periodically -Recommended to continue current medication  Hyperlipidemia: (LDL goal < 100) -Controlled - LDL is 38 without medication; pt has indication for statin (diabetes) but defers initiation -cath 11/2016 - no CAD. Hx statin intolerance -Current treatment: None -Educated on Cholesterol goals;  -Counseled on diet and exercise extensively  Diabetes  (A1c goal <7%) -Uncontrolled - A1c 8.5% (07/2022); pt endorses compliance with current regimen and denies frequent hypoglycemia - pt has hypoglycemic sx when BG is in 70s which happens less than once a month -h/o bariatric surgery 10/2016 -Current home glucose readings fasting glucose: n/a -Current medications: Basaglar 18 units daily PM (6pm) - Appropriate, Query Effective Metformin ER 500 mg daily -Appropriate, Query Effective Testing supplies -Medications previously tried/failed: glipizide, Jardiance (12/2018-02/2019), NPH 70/30 -Current meal patterns: no sweets, breads -Current exercise: gym 3x a week -Educated on A1c and blood sugar goals; Prevention and management of hypoglycemic episodes; Benefits of routine self-monitoring of blood sugar; Sick day management: maintain hydration with high fluid intake and call MD if glucose above 400 -Consider necessity of insulin - pt may be able to control DM with other medications (SGLT2 or GLP-1) that have additional benefits and do not promote hypoglycemia; his insurance plan covers brand-name drugs at low copay (<$10) so cost would not be a barrier - pt deferred changes today but may consider at follow up -Completed CGM training 09/07/22 -Recommended to continue current medication   Health Maintenance -Vaccine gaps: Prevnar, covid booster, TDAP  Patient Goals/Self-Care Activities Patient will:  - take medications as prescribed as evidenced by patient report and record review focus on medication adherence by pill box check glucose daily, document, and provide at future appointments check blood pressure periodically, document, and provide at future appointments     Medication Assistance: None required.  Patient affirms current coverage meets needs. - pt has Medicare/Medicaid.   Compliance/Adherence/Medication fill history: Care Gaps: Colonoscopy (due 12/03/21) - scheduled 09/25/22  Star-Rating Drugs: Metformin - PDC 100%  Medication  Access: Within the past 30 days, how often has patient missed a dose of medication? 0 Is a pillbox or other method used to improve adherence? Yes  Factors that may affect medication adherence? no barriers identified Are meds synced by current pharmacy? No  Are meds delivered by current pharmacy? Yes  Does patient experience delays in picking up medications due to transportation concerns? No   Upstream Services Reviewed: Is patient  disadvantaged to use [facility] Pharm[facility]?: Yes  Current Rx insurance plan: [employer] [place] Name and location of Current pharmacy:  [facility], [address] [place] [address] Phone: [phone] Fax: [phone]  [facility] Pharm[facility] services reviewed with patient today?: No  Patient requests to transfer care to Up[facility] Pharmacy?: No  Reason patient declined to change pharmacies: Disadvantaged due to insurance/mail order   Care Plan and Follow Up Patient Decision:  Patient agrees to Care Plan and Follow-up.  Plan: Telephone follow up appointment with care management team member scheduled for:  1 month  [name], PharmD, [facility] Clinical Pharmacist [facility] at [facility] [phone]

## 2022-09-07 NOTE — Patient Instructions (Signed)
Today we placed the Dexcom continuous glucose monitor (CGM) on your arm. It will track your sugar for you with new readings every 1-5 minutes.  Important points to remember about your CGM: -Any time the sugar reading does not reflect how you feel (particularly if it is telling you a very low reading), double check with a finger stick -The sensor is measuring the sugar in your skin, and a finger stick measures the sugar in your blood, and these numbers will probably never be exactly the same. This is normal. -The system will prompt you to change the sensor after approximately 10 days. -The sensor has to be removed prior to CT scans or MRIs. Try not to apply a new sensor if you know you have a scan coming up. -If the sensor falls off early, it cannot be re-applied. A new sensor should be applied.  With any sensor malfunctions, call Dexcom: [phone] For sensor refills, contact [place]: [phone]  Visit Information  Phone number for Pharmacist: [phone]   Goals Addressed   None     Care Plan : [place]  Updates made by Charlton Haws, RPH since 09/13/2022 12:00 AM     Problem: Hypertension, Hyperlipidemia, Diabetes, Heart Failure, and GERD   Priority: High     Long-Range Goal: Disease mgmt   Start Date: 09/14/2021  Expected End Date: 09/14/2022  This Visit's Progress: On track  Recent Progress: On track  Priority: High  Note:   Current Barriers:  Unable to independently monitor therapeutic efficacy  Pharmacist Clinical Goal(s):  Patient will achieve adherence to monitoring guidelines and medication adherence to achieve therapeutic efficacy through collaboration with PharmD and provider.   Interventions: 1:1 collaboration with [name], MD regarding development and update of comprehensive plan of care as evidenced by provider attestation and co-signature Inter-disciplinary care team collaboration (see longitudinal plan of  care) Comprehensive medication review performed; medication list updated in electronic medical record  Hypertension / Heart Failure (BP goal <130/80) -Controlled -Last ejection fraction: 40-50% (Date: 03/07/21) (improved from 30-35%) -HF type: HFmrEF (mildly reduced EF 41-49%) NYHA Class: II (slight limitation of activity) -s/p BIV ICD -Current home BP readings: not checking; Denies hypotensive/hypertensive symptoms including dizziness -Daily weight: 213 -Current treatment: Carvedilol 12.5 mg BID - Appropriate, Effective, Safe, Accessible Digoxin 0.125 mg daily -Appropriate, Effective, Safe, Accessible Entresto 97-103 mg BID -Appropriate, Effective, Safe, Accessible Spironolactone 25 mg daily -Appropriate, Effective, Safe, Accessible -Medications previously tried: furosemide, isosorbide  -Educated on BP goals and benefits of medications for prevention of heart attack, stroke and kidney damage;Exercise goal of 150 minutes per week; -Educated on Benefits of medications for managing symptoms and prolonging life -Counseled to monitor BP at home periodically -Recommended to continue current medication  Hyperlipidemia: (LDL goal < 100) -Controlled - LDL is 38 without medication; pt has indication for statin (diabetes) but defers initiation -cath 11/2016 - no CAD. Hx statin intolerance -Current treatment: None -Educated on Cholesterol goals;  -Counseled on diet and exercise extensively  Diabetes (A1c goal <7%) -Uncontrolled - A1c 8.5% (07/2022); pt endorses compliance with current regimen and denies frequent hypoglycemia - pt has hypoglycemic sx when BG is in 70s which happens less than once a month -h/o bariatric surgery 10/2016 -Current home glucose readings fasting glucose: n/a -Current medications: Basaglar 18 units daily PM (6pm) - Appropriate, Query Effective Metformin ER 500 mg daily -Appropriate, Query Effective Testing supplies -Medications previously tried/failed: glipizide,  Jardiance (12/2018-02/2019), NPH 70/30 -Current meal patterns: no sweets, breads -  Current exercise: gym 3x a week -Educated on A1c and blood sugar goals; Prevention and management of hypoglycemic episodes; Benefits of routine self-monitoring of blood sugar; Sick day management: maintain hydration with high fluid intake and call MD if glucose above 400 -Consider necessity of insulin - pt may be able to control DM with other medications (SGLT2 or GLP-1) that have additional benefits and do not promote hypoglycemia; his insurance plan covers brand-name drugs at low copay (<$10) so cost would not be a barrier - pt deferred changes today but may consider at follow up -Completed CGM training 09/07/22 -Recommended to continue current medication   Health Maintenance -Vaccine gaps: Prevnar, covid booster, TDAP  Patient Goals/Self-Care Activities Patient will:  - take medications as prescribed as evidenced by patient report and record review focus on medication adherence by pill box check glucose daily, document, and provide at future appointments check blood pressure periodically, document, and provide at future appointments      Patient verbalizes understanding of instructions and care plan provided today and agrees to view in [place]. Active MyChart status and patient understanding of how to access instructions and care plan via MyChart confirmed with patient.    Telephone follow up appointment with pharmacy team member scheduled for: 1 month  [name], PharmD, [place] Clinical Pharmacist [facility] [place]   [name], PharmD, BCACP Clinical Pharmacist [place] at [facility] [phone]

## 2022-09-14 DIAGNOSIS — I5020 Unspecified systolic (congestive) heart failure: Secondary | ICD-10-CM | POA: Diagnosis not present

## 2022-09-16 DIAGNOSIS — H1031 Unspecified acute conjunctivitis, right eye: Secondary | ICD-10-CM | POA: Diagnosis not present

## 2022-09-16 DIAGNOSIS — R059 Cough, unspecified: Secondary | ICD-10-CM | POA: Diagnosis not present

## 2022-09-16 DIAGNOSIS — R070 Pain in throat: Secondary | ICD-10-CM | POA: Diagnosis not present

## 2022-09-16 DIAGNOSIS — J101 Influenza due to other identified influenza virus with other respiratory manifestations: Secondary | ICD-10-CM | POA: Diagnosis not present

## 2022-09-25 ENCOUNTER — Encounter: Payer: Self-pay | Admitting: Gastroenterology

## 2022-09-25 ENCOUNTER — Ambulatory Visit
Admission: RE | Admit: 2022-09-25 | Discharge: 2022-09-25 | Disposition: A | Discharge status: Home / Self Care | Payer: Medicare HMO | Attending: Gastroenterology | Admitting: Gastroenterology

## 2022-09-25 ENCOUNTER — Ambulatory Visit: Payer: Medicare HMO | Admitting: Certified Registered Nurse Anesthetist

## 2022-09-25 ENCOUNTER — Encounter
Admission: RE | Disposition: A | Discharge status: Home / Self Care | Payer: Self-pay | Source: Home / Self Care | Attending: Gastroenterology

## 2022-09-25 DIAGNOSIS — Z95810 Presence of automatic (implantable) cardiac defibrillator: Secondary | ICD-10-CM | POA: Diagnosis not present

## 2022-09-25 DIAGNOSIS — I110 Hypertensive heart disease with heart failure: Secondary | ICD-10-CM | POA: Insufficient documentation

## 2022-09-25 DIAGNOSIS — I428 Other cardiomyopathies: Secondary | ICD-10-CM | POA: Diagnosis not present

## 2022-09-25 DIAGNOSIS — Z794 Long term (current) use of insulin: Secondary | ICD-10-CM | POA: Insufficient documentation

## 2022-09-25 DIAGNOSIS — I252 Old myocardial infarction: Secondary | ICD-10-CM | POA: Diagnosis not present

## 2022-09-25 DIAGNOSIS — K641 Second degree hemorrhoids: Secondary | ICD-10-CM | POA: Diagnosis not present

## 2022-09-25 DIAGNOSIS — Z9884 Bariatric surgery status: Secondary | ICD-10-CM | POA: Insufficient documentation

## 2022-09-25 DIAGNOSIS — M1990 Unspecified osteoarthritis, unspecified site: Secondary | ICD-10-CM | POA: Diagnosis not present

## 2022-09-25 DIAGNOSIS — Z1211 Encounter for screening for malignant neoplasm of colon: Secondary | ICD-10-CM

## 2022-09-25 DIAGNOSIS — Z7984 Long term (current) use of oral hypoglycemic drugs: Secondary | ICD-10-CM | POA: Diagnosis not present

## 2022-09-25 DIAGNOSIS — E109 Type 1 diabetes mellitus without complications: Secondary | ICD-10-CM | POA: Diagnosis not present

## 2022-09-25 DIAGNOSIS — K649 Unspecified hemorrhoids: Secondary | ICD-10-CM | POA: Diagnosis not present

## 2022-09-25 DIAGNOSIS — I5042 Chronic combined systolic (congestive) and diastolic (congestive) heart failure: Secondary | ICD-10-CM | POA: Insufficient documentation

## 2022-09-25 DIAGNOSIS — Z79899 Other long term (current) drug therapy: Secondary | ICD-10-CM | POA: Insufficient documentation

## 2022-09-25 DIAGNOSIS — E108 Type 1 diabetes mellitus with unspecified complications: Secondary | ICD-10-CM | POA: Insufficient documentation

## 2022-09-25 DIAGNOSIS — K219 Gastro-esophageal reflux disease without esophagitis: Secondary | ICD-10-CM | POA: Insufficient documentation

## 2022-09-25 HISTORY — PX: COLONOSCOPY WITH PROPOFOL: SHX5780

## 2022-09-25 LAB — GLUCOSE, CAPILLARY: Glucose-Capillary: 144 mg/dL — ABNORMAL HIGH (ref 70–99)

## 2022-09-25 SURGERY — COLONOSCOPY WITH PROPOFOL
Anesthesia: General

## 2022-09-25 MED ORDER — PHENYLEPHRINE HCL (PRESSORS) 10 MG/ML IV SOLN
INTRAVENOUS | Status: DC | PRN
  Administered 2022-09-25 (×3): 80 ug via INTRAVENOUS

## 2022-09-25 MED ORDER — SODIUM CHLORIDE 0.9 % IV SOLN: INTRAVENOUS | Status: DC

## 2022-09-25 MED ORDER — PHENYLEPHRINE 80 MCG/ML (10ML) SYRINGE FOR IV PUSH (FOR BLOOD PRESSURE SUPPORT)
PREFILLED_SYRINGE | INTRAVENOUS | Status: AC
  Filled 2022-09-25: qty 10

## 2022-09-25 MED ORDER — PROPOFOL 10 MG/ML IV BOLUS
INTRAVENOUS | Status: DC | PRN
  Administered 2022-09-25: 10 mg via INTRAVENOUS
  Administered 2022-09-25: 50 mg via INTRAVENOUS

## 2022-09-25 MED ORDER — PROPOFOL 500 MG/50ML IV EMUL
INTRAVENOUS | Status: DC | PRN
  Administered 2022-09-25: 150 ug/kg/min via INTRAVENOUS

## 2022-09-25 NOTE — Anesthesia Procedure Notes (Signed)
Date/Time: 09/25/2022 9:35 AM  Performed by: Demetrius Charity, CRNAPre-anesthesia Checklist: Patient identified, Emergency Drugs available, Suction available, Patient being monitored and Timeout performed Patient Re-evaluated:Patient Re-evaluated prior to induction Oxygen Delivery Method: Nasal cannula Induction Type: IV induction Placement Confirmation: CO2 detector and positive ETCO2

## 2022-09-25 NOTE — Op Note (Signed)
[facility] Patient Name: Philip Ponce Procedure Date: 09/25/2022 9:21 AM MRN: [id number] Account #: [id number] Date of Birth: 10-01-1970 Admit Type: Outpatient Age: 52 Room: [facility] ENDO ROOM 4 Gender: Male Note Status: Finalized Instrument Name: Park [id number] [id number] Procedure:             Colonoscopy Indications:           Screening for colorectal malignant neoplasm Providers:             [name] MD, MD Referring MD:          [name] MD, MD (Referring MD) Medicines:             Propofol per Anesthesia Complications:         No immediate complications. Procedure:             Pre-Anesthesia Assessment:                        - Prior to the procedure, a History and Physical was                         performed, and patient medications and allergies were                         reviewed. The patient's tolerance of previous                         anesthesia was also reviewed. The risks and benefits                         of the procedure and the sedation options and risks                         were discussed with the patient. All questions were                         answered, and informed consent was obtained. Prior                         Anticoagulants: The patient has taken no anticoagulant                         or antiplatelet agents. ASA Grade Assessment: II - A                         patient with mild systemic disease. After reviewing                         the risks and benefits, the patient was deemed in                         satisfactory condition to undergo the procedure.                        After obtaining informed consent, the colonoscope was                         passed under direct vision. Throughout the procedure,  the patient's blood pressure, pulse, and oxygen                         saturations were monitored continuously. The                         Colonoscope was introduced  through the anus and                         advanced to the the cecum, identified by appendiceal                         orifice and ileocecal valve. The colonoscopy was                         performed without difficulty. The patient tolerated                         the procedure well. The quality of the bowel                         preparation was adequate. Findings:      The perianal and digital rectal examinations were normal.      Non-bleeding internal hemorrhoids were found during retroflexion. The       hemorrhoids were Grade II (internal hemorrhoids that prolapse but reduce       spontaneously).      The exam was otherwise without abnormality. Impression:            - Non-bleeding internal hemorrhoids.                        - The examination was otherwise normal.                        - No specimens collected. Recommendation:        - Discharge patient to home.                        - Resume previous diet.                        - Continue present medications.                        - Repeat colonoscopy in 10 years for screening                         purposes. Procedure Code(s):     --- Professional ---                        [phone], Colonoscopy, flexible; diagnostic, including                         collection of specimen(s) by brushing or washing, when                         performed (separate procedure) Diagnosis Code(s):     --- Professional ---  Z12.11, Encounter for screening for malignant neoplasm                         of colon CPT copyright 2022 American Medical Association. All rights reserved. The codes documented in this report are preliminary and upon coder review may  be revised to meet current compliance requirements. [name] MD, MD 09/25/2022 9:46:10 AM This report has been signed electronically. Number of Addenda: 0 Note Initiated On: 09/25/2022 9:21 AM Scope Withdrawal Time: 0 hours 8 minutes 17 seconds  Total Procedure  Duration: 0 hours 11 minutes 44 seconds  Estimated Blood Loss:  Estimated blood loss: none.      [facility]

## 2022-09-25 NOTE — Transfer of Care (Signed)
Immediate Anesthesia Transfer of Care Note  Patient: Philip Ponce  Procedure(s) Performed: COLONOSCOPY WITH PROPOFOL  Patient Location: PACU  Anesthesia Type:General  Level of Consciousness: awake, alert , and oriented  Airway & Oxygen Therapy: Patient Spontanous Breathing  Post-op Assessment: Report given to RN and Post -op Vital signs reviewed and stable  Post vital signs: Reviewed and stable  Last Vitals:  Vitals Value Taken Time  BP 106/67 09/25/22 0947  Temp 36.3 C 09/25/22 0947  Pulse 89 09/25/22 0947  Resp 15 09/25/22 0948  SpO2 99 % 09/25/22 0947  Vitals shown include unvalidated device data.  Last Pain:  Vitals:   09/25/22 0947  TempSrc: Temporal  PainSc: 0-No pain         Complications: No notable events documented.

## 2022-09-25 NOTE — Anesthesia Preprocedure Evaluation (Addendum)
Anesthesia Evaluation  Patient identified by MRN, date of birth, ID band Patient awake    Reviewed: Allergy & Precautions, H&P , NPO status , Patient's Chart, lab work & pertinent test results, reviewed documented beta blocker date and time   History of Anesthesia Complications (+) Family history of anesthesia reaction  Airway Mallampati: II  TM Distance: <3 FB Neck ROM: full    Dental  (+) Teeth Intact, Chipped   Pulmonary neg pulmonary ROS, sleep apnea , neg COPD   Pulmonary exam normal        Cardiovascular Exercise Tolerance: Poor hypertension, (-) angina + Past MI, +CHF and + DOE  (-) Orthopnea and (-) PND Normal cardiovascular exam+ dysrhythmias + pacemaker + Cardiac Defibrillator  Rhythm:regular Rate:Normal     Neuro/Psych  Neuromuscular disease negative neurological ROS  negative psych ROS   GI/Hepatic negative GI ROS, Neg liver ROS,GERD  Medicated,,  Endo/Other  diabetes, Type 1, Insulin Dependent    Renal/GU negative Renal ROS  negative genitourinary   Musculoskeletal  (+) Arthritis ,    Abdominal   Peds  Hematology negative hematology ROS (+) Blood dyscrasia, anemia   Anesthesia Other Findings Past Medical History: 11/20/2016: Acute blood loss anemia 06/26/2017: AICD (automatic cardioverter/defibrillator) present     Comment:  BIV No date: Chronic combined systolic (congestive) and diastolic  (congestive) heart failure (HCC)     Comment:  a. 11/2016 Ehco: EF 20-25%, glob HK, antsept, ant, apical              HK, Gr1 DD, mild MR, mildly dil LA, nl RV fxn; b. 05/2017               Echo: Ef 20-25%, Gr2 DD, prominent apical trabeculations. No date: Diabetes mellitus ([place]) No date: Family history of adverse reaction to anesthesia     Comment:  mom had a hard time waking up No date: GERD (gastroesophageal reflux disease) No date: NICM (nonischemic cardiomyopathy) ([place])     Comment:  a. 11/2016 Cath: nl  cors, EF 25-30%; b. 11/2016 Echo: EF               20-25%; c. 05/2017 Echo: EF 20-25%. No date: Slipped intervertebral disc     Comment:  L4 L5 No date: STEMI (ST elevation myocardial infarction) ([place])     Comment:  a. 11/2016 ST elevation -->Nl cors on cath. Past Surgical History: 06/26/2017: BIV ICD INSERTION CRT-D 06/26/2017: BIV ICD INSERTION CRT-D; N/A     Comment:  Procedure: BIV ICD INSERTION CRT-D;  Surgeon: [name], MD;  Location: [facility];  Service:              Cardiovascular;  Laterality: N/A; 12/03/2016: COLONOSCOPY WITH PROPOFOL; N/A     Comment:  Procedure: COLONOSCOPY WITH PROPOFOL;  Surgeon: [name], MD;  Location: [facility] ENDOSCOPY;  Service: Endoscopy;              Laterality: N/A; 12/03/2016: ESOPHAGOGASTRODUODENOSCOPY (EGD) WITH PROPOFOL; N/A     Comment:  Procedure: ESOPHAGOGASTRODUODENOSCOPY (EGD) WITH               PROPOFOL;  Surgeon: [name], MD;  Location: [facility]               ENDOSCOPY;  Service: Endoscopy;  Laterality: N/A; 2012: FINGER SURGERY; Right  Comment:  index finger No date: GASTRIC BYPASS 01/07/2017: GIVENS CAPSULE STUDY; N/A     Comment:  Procedure: GIVENS CAPSULE STUDY;  Surgeon: [name],               MD;  Location: [facility] ENDOSCOPY;  Service: Endoscopy;                Laterality: N/A; 12/04/2016: LEFT HEART CATH AND CORONARY ANGIOGRAPHY; N/A     Comment:  Procedure: Left Heart Cath and Coronary Angiography;                Surgeon: [name], MD;  Location: [facility]              [facility];  Service: Cardiovascular;  Laterality: N/A; BMI    Body Mass Index:  24.28 kg/m     Reproductive/Obstetrics negative OB ROS                             Anesthesia Physical Anesthesia Plan  ASA: 3  Anesthesia Plan: General   Post-op Pain Management: Minimal or no pain anticipated   Induction: Intravenous  PONV Risk Score and Plan: 3 and Propofol infusion, TIVA and  Ondansetron  Airway Management Planned: Nasal Cannula  Additional Equipment: None  Intra-op Plan:   Post-operative Plan: Extubation in OR  Informed Consent: I have reviewed the patients History and Physical, chart, labs and discussed the procedure including the risks, benefits and alternatives for the proposed anesthesia with the patient or authorized representative who has indicated his/her understanding and acceptance.     Dental advisory given  Plan Discussed with: CRNA and Surgeon  Anesthesia Plan Comments: (Discussed risks of anesthesia with patient, including possibility of difficulty with spontaneous ventilation under anesthesia necessitating airway intervention, PONV, and rare risks such as cardiac or respiratory or neurological events, and allergic reactions. Discussed the role of CRNA in patient's perioperative care. Patient understands.)        Anesthesia Quick Evaluation

## 2022-09-25 NOTE — Anesthesia Postprocedure Evaluation (Signed)
Anesthesia Post Note  Patient: Philip Ponce  Procedure(s) Performed: COLONOSCOPY WITH PROPOFOL  Patient location during evaluation: Endoscopy Anesthesia Type: General Level of consciousness: awake and alert Pain management: pain level controlled Vital Signs Assessment: post-procedure vital signs reviewed and stable Respiratory status: spontaneous breathing, nonlabored ventilation, respiratory function stable and patient connected to nasal cannula oxygen Cardiovascular status: blood pressure returned to baseline and stable Postop Assessment: no apparent nausea or vomiting Anesthetic complications: no  No notable events documented.   Last Vitals:  Vitals:   09/25/22 0957 09/25/22 1007  BP: (!) 125/92 (!) 133/101  Pulse:    Resp:    Temp:    SpO2:      Last Pain:  Vitals:   09/25/22 1007  TempSrc:   PainSc: 0-No pain                 Dimas Millin

## 2022-09-25 NOTE — H&P (Signed)
Philip Lame, MD [address] Philip Ponce, [name] [address] Phone: [phone] Fax : [phone]  Primary Care Physician:  [name], MD Primary Gastroenterologist:  Dr. [name]  Pre-Procedure History & Physical: HPI:  Philip Ponce is a 52 y.o. male is here for a screening colonoscopy.   Past Medical History:  Diagnosis Date   Acute blood loss anemia 11/20/2016   AICD (automatic cardioverter/defibrillator) present    a. 06/2017 s/p MDT DTMA 1QQ [place] MRI Quad CRT-D SureScan (ser # [id number]).   Chronic combined systolic (congestive) and diastolic (congestive) heart failure ([place])    a. 11/2016 Echo: EF 20-25%, glob HK, antsept, ant, apical HK, Gr1 DD, mild MR, mildly dil LA, nl RV fxn; b. 05/2017 Echo: Ef 20-25%, Gr2 DD, prominent apical trabeculations; c. 12/2017 Echo: EF 30-35%, Gr1 DD, glob HK.   COVID-19 03/08/2021   Diabetes mellitus ([place])    Essential hypertension    Family history of adverse reaction to anesthesia    mom had a hard time waking up   GERD (gastroesophageal reflux disease)    NICM (nonischemic cardiomyopathy) ([place])    a. 11/2016 Cath: nl cors, EF 25-30%; b. 11/2016 Echo: EF 20-25%; c. 05/2017 Echo: EF 20-25%; d. 12/2017 Echo: EF 30-35%, Gr1 DD.   Slipped intervertebral disc    L4 L5   STEMI (ST elevation myocardial infarction) ([place])    a. 11/2016 ST elevation -->Nl cors on cath.    Past Surgical History:  Procedure Laterality Date   BIV ICD INSERTION CRT-D  06/26/2017   BIV ICD INSERTION CRT-D N/A 06/26/2017   Procedure: BIV ICD INSERTION CRT-D;  Surgeon: [name], MD;  Location: [facility];  Service: Cardiovascular;  Laterality: N/A;   CERVICAL DISC ARTHROPLASTY N/A 07/24/2017   Procedure: CERVICAL ANTERIOR [name] ARTHROPLASTY C5-C7;  Surgeon: [name], MD;  Location: [facility] ORS;  Service: Neurosurgery;  Laterality: N/A;   COLONOSCOPY WITH PROPOFOL N/A 12/03/2016   Procedure: COLONOSCOPY WITH PROPOFOL;  Surgeon:  [name], MD;  Location: [facility] ENDOSCOPY;  Service: Endoscopy;  Laterality: N/A;   ESOPHAGOGASTRODUODENOSCOPY (EGD) WITH PROPOFOL N/A 12/03/2016   Procedure: ESOPHAGOGASTRODUODENOSCOPY (EGD) WITH PROPOFOL;  Surgeon: [name], MD;  Location: [facility] ENDOS[facility];  Service: Endoscopy;  Laterality: N/A;   FINGER SURGERY Right 2012   index finger   GASTRIC BYPASS     GIVENS CAPSULE STUDY N/A 01/07/2017   Procedure: GIVENS CAPSULE STUDY;  Surgeon: [name], MD;  Location: [facility] END[facility];  Service: Endoscopy;  Laterality: N/A;   LEFT HEART CATH AND CORONARY ANGIOGRAPHY N/A 12/04/2016   Procedure: Left Heart Cath and Coronary Angiography;  Surgeon: [name], MD;  Location: [facility];  Service: Cardiovascular;  Laterality: N/A;   SHOULDER ARTHROSCOPY WITH OPEN ROTATOR CUFF REPAIR Left 10/04/2017   Procedure: SHOULDER ARTHROSCOPY WITH ROTATOR CUFF REPAIR, SUBACROMIAL DECOMPRESSION,OPEN BICEP TENODESIS, EXTENSIVE DEBRIDEMENT;  Surgeon: [name], MD;  Location: [facility] ORS;  Service: Orthopedics;  Laterality: Left;    Prior to Admission medications   Medication Sig Start Date End Date Taking? Authorizing Provider  digoxin (LANOXIN) 0.125 MG tablet Take 1 tablet (125 mcg total) by mouth daily. 09/29/19  Yes [name], MD  Insulin Glargine [facility]) 100 UNIT/ML INJECT 18 UNITS            SUBCUTANEOUSLY DAILY 04/10/22  Yes Bedsole, Amy E, MD  metFORMIN (GLUCOPHAGE-XR) 500 MG 24 hr tablet TAKE 1 TABLET DAILY WITH   BREAKFAST 07/15/22  Yes Bedsole,  Amy E, MD  metoprolol succinate (TOPROL-XL) 100 MG 24 hr tablet Take 1 tablet (100 mg total) by mouth daily. Take with or immediately following a meal. 08/07/22  Yes [name], [name], MD  sacubitril-valsartan (ENTRESTO) 97-103 MG Take 1 tablet by mouth 2 (two) times daily. 01/05/19  Yes [name], MD  spironolactone (ALDACTONE) 25 MG tablet Take 1 tablet (25 mg total) by mouth daily. 01/05/19  Yes [name], MD  Blood  Glucose Monitoring Suppl (ONE TOUCH ULTRA 2) w/Device KIT Use to check blood sugar up to 2 times a day 05/08/21   [name], Amy E, MD  calcium carbonate (OS-CAL) 600 MG TABS tablet Take 1,200 mg by mouth daily with breakfast.     [provider]  Continuous Blood Gluc Sensor ([place]) MISC by Does not apply route.    [provider]  glucose blood (ONE TOUCH ULTRA TEST) test strip Check blood sugar up to 2 times a day and as instructed. Dx E11.9 05/08/21   [name], MD  Insulin Pen Needle (B-D ULTRAFINE III SHORT PEN) 31G X 8 MM MISC Use to inject insulin daily 09/29/21   Bedsole, Amy E, MD  Lancets (ONETOUCH ULTRASOFT) lancets Use to check blood sugar up to 2 times a day. 05/08/21   Bedsole, Amy E, MD  Multiple Vitamins-Minerals (ADULT GUMMY PO) Take 2 tablets by mouth daily.    [provider]  omeprazole (PRILOSEC) 40 MG capsule TAKE 1 CAPSULE DAILY AS    NEEDED FOR INDIGESTION 07/15/22   [name], MD  OVER THE COUNTER MEDICATION SEA MOSS SUPPLEMENT    [provider]    Allergies as of 09/06/2022 - Review Complete 08/31/2022  Allergen Reaction Noted   Mobic [meloxicam] Other (See Comments) 11/23/2016   Tramadol  10/09/2017   Diclofenac Palpitations and Other (See Comments) 11/20/2016    Family History  Problem Relation Age of Onset   Diabetes Mother    Hypertension Mother    Cancer Mother        breast   Diabetes Father    Hypertension Father    Cancer Father        colon   Cancer Maternal Grandfather        prostate   Stroke Maternal Grandfather        CVA    Social History   Socioeconomic History   Marital status: Married    Spouse name: Not on file   Number of children: Not on file   Years of education: Not on file   Highest education level: Not on file  Occupational History   Occupation: Radiographer, therapeutic filters, vault salesman    Employer: [employer]   Occupation: disability  Tobacco Use   Smoking status:  Never   Smokeless tobacco: Never  Vaping Use   Vaping Use: Never used  Substance and Sexual Activity   Alcohol use: Not Currently   Drug use: No   Sexual activity: Yes  Other Topics Concern   Not on file  Social History Narrative   Regular exercise-yes, walking one mile per day   Diet: fast food, diet soda, unsweeted tea   Social Determinants of Health   Financial Resource Strain: Low Risk  (07/23/2022)   Overall Financial Resource Strain (CARDIA)    Difficulty of Paying Living Expenses: Not hard at all  Food Insecurity: No Food Insecurity (07/23/2022)   Hunger Vital Sign    Worried About Running Out of  Food in the Last Year: Never true    [place] in the Last Year: Never true  Transportation Needs: No Transportation Needs (07/23/2022)   PRAPARE - Hydrologist (Medical): No    Lack of Transportation (Non-Medical): No  Physical Activity: Insufficiently Active (07/23/2022)   Exercise Vital Sign    Days of Exercise per Week: 3 days    Minutes of Exercise per Session: 30 min  Stress: No Stress Concern Present (07/23/2022)   [place]    Feeling of Stress : Only a little  Social Connections: Moderately Integrated (07/23/2022)   Social Connection and Isolation Panel [NHANES]    Frequency of Communication with Friends and Family: More than three times a week    Frequency of Social Gatherings with Friends and Family: Twice a week    Attends Religious Services: More than 4 times per year    Active Member of [employer] or Organizations: No    Attends Archivist Meetings: Never    Marital Status: Married  Human resources officer Violence: Not At Risk (07/23/2022)   Humiliation, Afraid, Rape, and Kick questionnaire    Fear of Current or Ex-Partner: No    Emotionally Abused: No    Physically Abused: No    Sexually Abused: No    Review of Systems: See HPI, otherwise negative  ROS  Physical Exam: There were no vitals taken for this visit. General:   Alert,  pleasant and cooperative in NAD Head:  Normocephalic and atraumatic. Neck:  Supple; no masses or thyromegaly. Lungs:  Clear throughout to auscultation.    Heart:  Regular rate and rhythm. Abdomen:  Soft, nontender and nondistended. Normal bowel sounds, without guarding, and without rebound.   Neurologic:  Alert and  oriented x4;  grossly normal neurologically.  Impression/Plan: Philip Ponce is now here to undergo a screening colonoscopy.  Risks, benefits, and alternatives regarding colonoscopy have been reviewed with the patient.  Questions have been answered.  All parties agreeable.

## 2022-09-26 ENCOUNTER — Encounter: Payer: Self-pay | Admitting: Gastroenterology

## 2022-09-26 ENCOUNTER — Ambulatory Visit (INDEPENDENT_AMBULATORY_CARE_PROVIDER_SITE_OTHER): Payer: Medicare HMO | Admitting: Family Medicine

## 2022-09-26 VITALS — BP 124/82 | HR 88 | Temp 97.9°F | Ht 71.25 in | Wt 214.0 lb

## 2022-09-26 DIAGNOSIS — R059 Cough, unspecified: Secondary | ICD-10-CM | POA: Diagnosis not present

## 2022-09-26 MED ORDER — BENZONATATE 100 MG PO CAPS: 100.0000 mg | ORAL_CAPSULE | Freq: Three times a day (TID) | ORAL | 0 refills | Status: DC | PRN

## 2022-09-26 NOTE — Progress Notes (Signed)
Patient ID: Philip Ponce, male    DOB: [birth year], 52 y.o.   MRN: [id number]  This visit was conducted in person.  BP 124/82   Pulse 88   Temp 97.9 F (36.6 C)   Ht 5' 11.25" (1.81 m)   Wt 214 lb (97.1 kg)   SpO2 97%   BMI 29.64 kg/m    CC: cough Subjective:   HPI: Philip Ponce is a 52 y.o. male presenting on 09/26/2022 for Cough (Dry cough during day, productive at night; denies fever/shob/wheezing), Sore Throat (From coughing), and Hyperglycemia   Several weeks of ST with PNDrainage causing productive cough. Cough persisting, worse at night. No coughing fits.  He's been treating with ricola cough drops and chloraseptic throat spray and OTC tussin cough syrup.  No sick contacts at home.  No h/o asthma Non smoker.  No fevers/chills, ear or tooth pain, wheezing, shortness of breath, chest pain. No new or worsening leg swelling.   Did have influenza 09/16/2022 diagnosed by [facility] - treated with tamiflu.  Also developed R conjunctivitis treated with polytrim antibiotic eye drops.  Just had colonoscopy yesterday [name]).   DM - cbg's fluctuating, saw pharmacist for CGM teaching (Dexcom G7).  Lab Results  Component Value Date   HGBA1C 8.5 (H) 08/03/2022    Known CHF as well s/p pacemaker/defibrillator     Relevant past medical, surgical, family and social history reviewed and updated as indicated. Interim medical history since our last visit reviewed. Allergies and medications reviewed and updated. Outpatient Medications Prior to Visit  Medication Sig Dispense Refill   Blood Glucose Monitoring Suppl (ONE TOUCH ULTRA 2) w/Device KIT Use to check blood sugar up to 2 times a day 1 kit 0   calcium carbonate (OS-CAL) 600 MG TABS tablet Take 1,200 mg by mouth daily with breakfast.      Continuous Blood Gluc Sensor (DEXCOM G7 SENSOR) MISC by Does not apply route.     digoxin (LANOXIN) 0.125 MG tablet Take 1 tablet (125 mcg total) by mouth daily. 30 tablet 3   glucose  blood (ONE TOUCH ULTRA TEST) test strip Check blood sugar up to 2 times a day and as instructed. Dx E11.9 100 each 5   Insulin Glargine (BASAGLAR KWIKPEN) 100 UNIT/ML INJECT 18 UNITS            SUBCUTANEOUSLY DAILY 15 mL 1   Insulin Pen Needle (B-D ULTRAFINE III SHORT PEN) 31G X 8 MM MISC Use to inject insulin daily 90 each 3   Lancets (ONETOUCH ULTRASOFT) lancets Use to check blood sugar up to 2 times a day. 100 each 5   metFORMIN (GLUCOPHAGE-XR) 500 MG 24 hr tablet TAKE 1 TABLET DAILY WITH   BREAKFAST 90 tablet 0   metoprolol succinate (TOPROL-XL) 100 MG 24 hr tablet Take 1 tablet (100 mg total) by mouth daily. Take with or immediately following a meal. 90 tablet 1   Multiple Vitamins-Minerals (ADULT GUMMY PO) Take 2 tablets by mouth daily.     omeprazole (PRILOSEC) 40 MG capsule TAKE 1 CAPSULE DAILY AS    NEEDED FOR INDIGESTION 90 capsule 0   OVER THE COUNTER MEDICATION SEA MOSS SUPPLEMENT     sacubitril-valsartan (ENTRESTO) 97-103 MG Take 1 tablet by mouth 2 (two) times daily. 180 tablet 3   spironolactone (ALDACTONE) 25 MG tablet Take 1 tablet (25 mg total) by mouth daily. 90 tablet 3   trimethoprim-polymyxin b (POLYTRIM) ophthalmic solution Place 2 drops into the right eye  every 6 (six) hours.     No facility-administered medications prior to visit.     Per HPI unless specifically indicated in ROS section below Review of Systems  Objective:  BP 124/82   Pulse 88   Temp 97.9 F (36.6 C)   Ht 5' 11.25" (1.81 m)   Wt 214 lb (97.1 kg)   SpO2 97%   BMI 29.64 kg/m   Wt Readings from Last 3 Encounters:  09/26/22 214 lb (97.1 kg)  09/25/22 208 lb 12.8 oz (94.7 kg)  08/31/22 214 lb 4 oz (97.2 kg)      Physical Exam Vitals and nursing note reviewed.  Constitutional:      Appearance: Normal appearance. He is not ill-appearing.  HENT:     Head: Normocephalic and atraumatic.     Right Ear: Hearing, tympanic membrane, ear canal and external ear normal. There is no impacted cerumen.      Left Ear: Hearing, tympanic membrane, ear canal and external ear normal. There is no impacted cerumen.     Mouth/Throat:     Mouth: Mucous membranes are moist.     Pharynx: Oropharynx is clear. No oropharyngeal exudate or posterior oropharyngeal erythema.  Eyes:     Extraocular Movements: Extraocular movements intact.     Conjunctiva/sclera: Conjunctivae normal.     Pupils: Pupils are equal, round, and reactive to light.  Cardiovascular:     Rate and Rhythm: Normal rate and regular rhythm.     Pulses: Normal pulses.     Heart sounds: Normal heart sounds. No murmur heard. Pulmonary:     Effort: Pulmonary effort is normal. No respiratory distress.     Breath sounds: Normal breath sounds. No wheezing, rhonchi or rales.     Comments: Lungs clear Musculoskeletal:     Cervical back: Normal range of motion and neck supple. No rigidity.     Right lower leg: No edema.     Left lower leg: No edema.  Lymphadenopathy:     Cervical: No cervical adenopathy.  Skin:    General: Skin is warm and dry.     Findings: No rash.  Neurological:     Mental Status: He is alert.  Psychiatric:        Mood and Affect: Mood normal.        Behavior: Behavior normal.       Results for orders placed or performed during the hospital encounter of 09/25/22  Glucose, capillary  Result Value Ref Range   Glucose-Capillary 144 (H) 70 - 99 mg/dL    Assessment & Plan:   Problem List Items Addressed This Visit     Cough - Primary    Anticipate post infectious cough after influenza.  Reassuring exam.  Discussed cheratussin, however pt hesitant due to tramadol allergy  Rx tessalon perls.  Supportive measures reviewed.  Red flags to seek further care reviewed.         Meds ordered this encounter  Medications   benzonatate (TESSALON) 100 MG capsule    Sig: Take 1 capsule (100 mg total) by mouth 3 (three) times daily as needed for cough.    Dispense:  30 capsule    Refill:  0   No orders of the  defined types were placed in this encounter.    Patient Instructions  I think you have post-infectious cough - this should continue to improve over time but can take several weeks. Push fluids and rest. May try tessalon perls as needed for cough. Swallow,  don't chew Let us know if fever >101, worsening productive cough, or symptoms not improving as expected.   Follow up plan: Return if symptoms worsen or fail to improve.  Ria Bush, MD

## 2022-09-26 NOTE — Patient Instructions (Signed)
I think you have post-infectious cough - this should continue to improve over time but can take several weeks. Push fluids and rest. May try tessalon perls as needed for cough. Swallow, don't chew Let us know if fever >101, worsening productive cough, or symptoms not improving as expected.

## 2022-09-26 NOTE — Assessment & Plan Note (Addendum)
Anticipate post infectious cough after influenza.  Reassuring exam.  Discussed cheratussin, however pt hesitant due to tramadol allergy  Rx tessalon perls.  Supportive measures reviewed.  Red flags to seek further care reviewed.

## 2022-09-27 ENCOUNTER — Telehealth: Payer: Self-pay

## 2022-09-27 NOTE — Progress Notes (Addendum)
Care Management & Coordination Services Pharmacy Team  Reason for Encounter: Appointment Reminder  Contacted patient on 09/27/2022    Medications: Outpatient Encounter Medications as of 09/27/2022  Medication Sig   benzonatate (TESSALON) 100 MG capsule Take 1 capsule (100 mg total) by mouth 3 (three) times daily as needed for cough.   Blood Glucose Monitoring Suppl (ONE TOUCH ULTRA 2) w/Device KIT Use to check blood sugar up to 2 times a day   calcium carbonate (OS-CAL) 600 MG TABS tablet Take 1,200 mg by mouth daily with breakfast.    Continuous Blood Gluc Sensor (DEXCOM G7 SENSOR) MISC by Does not apply route.   digoxin (LANOXIN) 0.125 MG tablet Take 1 tablet (125 mcg total) by mouth daily.   glucose blood (ONE TOUCH ULTRA TEST) test strip Check blood sugar up to 2 times a day and as instructed. Dx E11.9   Insulin Glargine (BASAGLAR KWIKPEN) 100 UNIT/ML INJECT 18 UNITS            SUBCUTANEOUSLY DAILY   Insulin Pen Needle (B-D ULTRAFINE III SHORT PEN) 31G X 8 MM MISC Use to inject insulin daily   Lancets (ONETOUCH ULTRASOFT) lancets Use to check blood sugar up to 2 times a day.   metFORMIN (GLUCOPHAGE-XR) 500 MG 24 hr tablet TAKE 1 TABLET DAILY WITH   BREAKFAST   metoprolol succinate (TOPROL-XL) 100 MG 24 hr tablet Take 1 tablet (100 mg total) by mouth daily. Take with or immediately following a meal.   Multiple Vitamins-Minerals (ADULT GUMMY PO) Take 2 tablets by mouth daily.   omeprazole (PRILOSEC) 40 MG capsule TAKE 1 CAPSULE DAILY AS    NEEDED FOR INDIGESTION   OVER THE COUNTER MEDICATION SEA MOSS SUPPLEMENT   sacubitril-valsartan (ENTRESTO) 97-103 MG Take 1 tablet by mouth 2 (two) times daily.   spironolactone (ALDACTONE) 25 MG tablet Take 1 tablet (25 mg total) by mouth daily.   trimethoprim-polymyxin b (POLYTRIM) ophthalmic solution Place 2 drops into the right eye every 6 (six) hours.   No facility-administered encounter medications on file as of 09/27/2022.   Lab Results   Component Value Date/Time   HGBA1C 8.5 (H) 08/03/2022 07:52 AM   HGBA1C 8.3 (A) 02/06/2022 11:18 AM   HGBA1C 5.8 08/08/2021 03:05 PM   HGBA1C 8.4 (H) 08/04/2012 07:54 AM   MICROALBUR 1.7 08/03/2022 07:52 AM   MICROALBUR 1.6 07/19/2020 08:31 AM    BP Readings from Last 3 Encounters:  09/26/22 124/82  09/25/22 (!) 133/101  08/31/22 120/70    Unsuccessful attempt to reach patient. Left patient message reminding patient of appointment.  Patient contacted to confirm telephone appointment with [name],   PharmD, on 10/02/2022 at 3:45.   Star Rating Drugs:  Medication:  Last Fill: [employer] Supply Basaglar kwikpen 07/02/22 84 Metformin 553m 07/21/22 90 Entresto 97-106m11/13/23 90   Care Gaps: Annual wellness visit in last year? Yes  If Diabetic: Last eye exam / retinopathy screening:UTD Last diabetic foot exam:2022   LiCharlene BrookeCPP notified  VeAvel [id number]

## 2022-09-28 ENCOUNTER — Ambulatory Visit (INDEPENDENT_AMBULATORY_CARE_PROVIDER_SITE_OTHER): Payer: Medicare HMO

## 2022-09-28 DIAGNOSIS — I428 Other cardiomyopathies: Secondary | ICD-10-CM

## 2022-09-28 LAB — CUP PACEART REMOTE DEVICE CHECK
Battery Remaining Longevity: 22 mo
Battery Voltage: 2.92 V
Brady Statistic AP VP Percent: 0.01 %
Brady Statistic AP VS Percent: 0 %
Brady Statistic AS VP Percent: 98.72 %
Brady Statistic AS VS Percent: 1.27 %
Brady Statistic RA Percent Paced: 0.01 %
Brady Statistic RV Percent Paced: 84.55 %
Date Time Interrogation Session: 20240105033323
HighPow Impedance: 55 Ohm
Implantable Lead Connection Status: 753985
Implantable Lead Connection Status: 753985
Implantable Lead Connection Status: 753985
Implantable Lead Implant Date: 20181003
Implantable Lead Implant Date: 20181003
Implantable Lead Implant Date: 20181003
Implantable Lead Location: 753858
Implantable Lead Location: 753859
Implantable Lead Location: 753860
Implantable Lead Model: 4398
Implantable Lead Model: 5076
Implantable Pulse Generator Implant Date: 20181003
Lead Channel Impedance Value: 184.154
Lead Channel Impedance Value: 188.1 Ohm
Lead Channel Impedance Value: 204.14 Ohm
Lead Channel Impedance Value: 205.2 Ohm
Lead Channel Impedance Value: 224.438
Lead Channel Impedance Value: 285 Ohm
Lead Channel Impedance Value: 342 Ohm
Lead Channel Impedance Value: 342 Ohm
Lead Channel Impedance Value: 399 Ohm
Lead Channel Impedance Value: 418 Ohm
Lead Channel Impedance Value: 418 Ohm
Lead Channel Impedance Value: 513 Ohm
Lead Channel Impedance Value: 608 Ohm
Lead Channel Impedance Value: 646 Ohm
Lead Channel Impedance Value: 665 Ohm
Lead Channel Impedance Value: 703 Ohm
Lead Channel Impedance Value: 722 Ohm
Lead Channel Impedance Value: 760 Ohm
Lead Channel Pacing Threshold Amplitude: 0.625 V
Lead Channel Pacing Threshold Amplitude: 0.625 V
Lead Channel Pacing Threshold Amplitude: 1.25 V
Lead Channel Pacing Threshold Pulse Width: 0.4 ms
Lead Channel Pacing Threshold Pulse Width: 0.4 ms
Lead Channel Pacing Threshold Pulse Width: 0.4 ms
Lead Channel Sensing Intrinsic Amplitude: 2 mV
Lead Channel Sensing Intrinsic Amplitude: 2 mV
Lead Channel Sensing Intrinsic Amplitude: 8.25 mV
Lead Channel Sensing Intrinsic Amplitude: 8.25 mV
Lead Channel Setting Pacing Amplitude: 1.5 V
Lead Channel Setting Pacing Amplitude: 1.5 V
Lead Channel Setting Pacing Amplitude: 2 V
Lead Channel Setting Pacing Pulse Width: 0.4 ms
Lead Channel Setting Pacing Pulse Width: 0.4 ms
Lead Channel Setting Sensing Sensitivity: 0.3 mV
Zone Setting Status: 755011
Zone Setting Status: 755011

## 2022-10-02 ENCOUNTER — Telehealth: Payer: Self-pay | Admitting: Pharmacist

## 2022-10-02 ENCOUNTER — Encounter: Payer: Medicare HMO | Admitting: Pharmacist

## 2022-10-02 NOTE — Progress Notes (Unsigned)
Care Management & Coordination Services Pharmacy Note  10/02/2022 Name:  Philip Ponce MRN:  [id number] DOB:  January 20, 1971  Summary: F/U visit -DM: A1c 8.5% (07/2022)  Recommendations/Changes made from today's visit: ***  Follow up plan: -Health Concierge will call patient *** -Pharmacist follow up televisit scheduled for *** -PCP appt 11/30/22    Subjective: Philip Ponce is an 52 y.o. year old male who is a primary patient of Bedsole, Amy E, MD.  The care coordination team was consulted for assistance with disease management and care coordination needs.    Engaged with patient by telephone for follow up visit.  Patient Care Team: [name], MD as PCP - General [name], MD as PCP - Electrophysiology (Cardiology) End, Harrell Gave, MD as PCP - Cardiology (Cardiology) Alisa Graff, FNP as Nurse Practitioner (Family Medicine) Charlton Haws, [facility] as Pharmacist (Pharmacist)  Recent office visits: 09/26/22 Dr Danise Mina OV: cough - post-infectious after flu.  rx benzonatate  08/31/22 Dr [name] OV: annual - set up appt for [facility] training. Call for colonoscopy. G72.0 code.   02/06/22 Dr [name] OV: f/u DM. A1c 8.3%; referred for CGM. Discuss Jardiance with cardiology.  Recent consult visits: 09/04/22 Dr Dema Severin (Cardiology): HF - scheduled repeat ECHO. 06/11/22 Dr Curt Bears (Cardiology EP): HF; stop carvedilol, start Metoprolol succinate 100 mg 05/03/22 Dr Dema Severin (Cardiology): f/u HF, consider SGLT2 at next visit. 12/17/69 NP Delicia White (Cardiology): f/u HF. Consider SGLT2i at next visit 2/45/80 NP Delicia White (Cardiology): f/u HF. Digoxin level today.  Hospital visits: None in previous 6 months   Objective:  Lab Results  Component Value Date   CREATININE 0.98 08/03/2022   BUN 13 08/03/2022   GFR 88.22 08/03/2022   GFRNONAA >60 02/12/2019   GFRAA >60 02/12/2019   NA 140 08/03/2022   K 4.5 08/03/2022   CALCIUM 8.7 08/03/2022   CO2 33 (H)  08/03/2022   GLUCOSE 127 (H) 08/03/2022    Lab Results  Component Value Date/Time   HGBA1C 8.5 (H) 08/03/2022 07:52 AM   HGBA1C 8.3 (A) 02/06/2022 11:18 AM   HGBA1C 5.8 08/08/2021 03:05 PM   HGBA1C 8.4 (H) 08/04/2012 07:54 AM   GFR 88.22 08/03/2022 07:52 AM   GFR 83.69 08/08/2021 03:05 PM   MICROALBUR 1.7 08/03/2022 07:52 AM   MICROALBUR 1.6 07/19/2020 08:31 AM    Last diabetic Eye exam:  Lab Results  Component Value Date/Time   HMDIABEYEEXA No Retinopathy 07/19/2022 12:00 AM    Last diabetic Foot exam:  Lab Results  Component Value Date/Time   HMDIABFOOTEX done 05/01/2021 12:00 AM     Lab Results  Component Value Date   CHOL 127 08/03/2022   HDL 65.70 08/03/2022   LDLCALC 47 08/03/2022   TRIG 71.0 08/03/2022   CHOLHDL 2 08/03/2022       Latest Ref Rng & Units 08/03/2022    7:52 AM 08/08/2021    3:05 PM 07/19/2020    8:31 AM  Hepatic Function  Total Protein 6.0 - 8.3 g/dL 7.1  7.5  6.9   Albumin 3.5 - 5.2 g/dL 3.9  4.0  3.8   AST 0 - 37 U/L '13  17  20   '$ ALT 0 - 53 U/L '12  13  18   '$ Alk Phosphatase 39 - 117 U/L 55  59  75   Total Bilirubin 0.2 - 1.2 mg/dL 0.7  0.4  0.6     Lab Results  Component Value Date/Time   TSH 0.61  08/08/2021 03:05 PM   TSH 1.019 11/30/2016 08:32 AM   TSH 0.549 08/04/2012 07:54 AM       Latest Ref Rng & Units 12/22/2020   11:09 AM 06/22/2020   10:45 AM 12/18/2019   11:44 AM  CBC  WBC 4.0 - 10.5 K/uL 4.1  5.2  4.3   Hemoglobin 13.0 - 17.0 g/dL 11.8  11.4  12.4   Hematocrit 39.0 - 52.0 % 36.2  34.3  38.8   Platelets 150 - 400 K/uL 190  257  217     Lab Results  Component Value Date/Time   VITAMINB12 307 08/08/2021 03:05 PM   CZYSAYTK16 010 06/22/2020 10:45 AM    Clinical ASCVD: {YES/NO:21197} The ASCVD Risk score (Arnett DK, et al., 2019) failed to calculate for the following reasons:   The patient has a prior MI or stroke diagnosis       07/23/2022   11:34 AM 07/20/2021   12:15 PM 07/19/2020   12:07 PM  Depression  screen PHQ 2/9  Decreased Interest 0 0 0  Down, Depressed, Hopeless 0 0 0  PHQ - 2 Score 0 0 0  Altered sleeping 0  0  Tired, decreased energy 0  0  Change in appetite 0  0  Feeling bad or failure about yourself  0  0  Trouble concentrating 0  0  Moving slowly or fidgety/restless 0  0  Suicidal thoughts 0  0  PHQ-9 Score 0  0  Difficult doing work/chores Not difficult at all  Not difficult at all     ***Other: (CHADS2VASc if Afib, MMRC or CAT for COPD, ACT, DEXA)  Social History   Tobacco Use  Smoking Status Never  Smokeless Tobacco Never   BP Readings from Last 3 Encounters:  09/26/22 124/82  09/25/22 (!) 133/101  08/31/22 120/70   Pulse Readings from Last 3 Encounters:  09/26/22 88  09/25/22 89  08/31/22 93   Wt Readings from Last 3 Encounters:  09/26/22 214 lb (97.1 kg)  09/25/22 208 lb 12.8 oz (94.7 kg)  08/31/22 214 lb 4 oz (97.2 kg)   BMI Readings from Last 3 Encounters:  09/26/22 29.64 kg/m  09/25/22 28.92 kg/m  08/31/22 29.67 kg/m    Allergies  Allergen Reactions   Mobic [Meloxicam] Other (See Comments)    Ulcers in stomach eruption    Tramadol     Hard to breath   Diclofenac Palpitations and Other (See Comments)    Fast heart beat, CP,SOB and weakness on one side.    Medications Reviewed Today     Reviewed by Ria Bush, MD (Physician) on 09/26/22 at 1406  Med List Status: <None>   Medication Order Taking? Sig Documenting Provider Last Dose Status Informant  Blood Glucose Monitoring Suppl (ONE TOUCH ULTRA 2) w/Device KIT [id number] Yes Use to check blood sugar up to 2 times a day Bedsole, Amy E, MD Taking Active   calcium carbonate (OS-CAL) 600 MG TABS tablet [id number] Yes Take 1,200 mg by mouth daily with breakfast.  [provider] Taking Active Self  Continuous Blood Gluc Sensor (Colt) [place] [id number] Yes by Does not apply route. [provider] Taking Active   digoxin (LANOXIN) 0.125 MG tablet [id number]  Yes Take 1 tablet (125 mcg total) by mouth daily. [name], MD Taking Active   glucose blood (ONE TOUCH ULTRA TEST) test strip [id number] Yes Check blood sugar up to 2 times a day and as instructed. Dx E11.9 [name],  Amy E, MD Taking Active   Insulin Glargine (BASAGLAR KWIKPEN) 100 UNIT/ML [id number] Yes INJECT 18 UNITS            SUBCUTANEOUSLY DAILY Bedsole, Amy E, MD Taking Active   Insulin Pen Needle (B-D ULTRAFINE III SHORT PEN) 31G X 8 MM MISC [id number] Yes Use to inject insulin daily [name], Amy E, MD Taking Active   Lancets [facility] ULTRASOFT) lancets [id number] Yes Use to check blood sugar up to 2 times a day. [name], MD Taking Active   metFORMIN (GLUCOPHAGE-XR) 500 MG 24 hr tablet [id number] Yes TAKE 1 TABLET DAILY WITH   BREAKFAST Bedsole, Amy E, MD Taking Active   metoprolol succinate (TOPROL-XL) 100 MG 24 hr tablet [id number] Yes Take 1 tablet (100 mg total) by mouth daily. Take with or immediately following a meal. [name], [name], MD Taking Active   Multiple Vitamins-Minerals (ADULT GUMMY PO) [id number] Yes Take 2 tablets by mouth daily. [provider] Taking Active Self  omeprazole (PRILOSEC) 40 MG capsule [id number] Yes TAKE 1 CAPSULE DAILY AS    NEEDED FOR Dia Sitter, MD Taking Active   OVER THE COUNTER MEDICATION [id number] Yes SEA MOSS SUPPLEMENT [provider] Taking Active   sacubitril-valsartan (ENTRESTO) 97-103 MG [id number] Yes Take 1 tablet by mouth 2 (two) times daily. [name], MD Taking Active   spironolactone (ALDACTONE) 25 MG tablet [id number] Yes Take 1 tablet (25 mg total) by mouth daily. [name], MD Taking Active   trimethoprim-polymyxin b [facility]) ophthalmic solution [id number] Yes Place 2 drops into the right eye every 6 (six) hours. [provider] Taking Active             Patient Active Problem List   Diagnosis Date Noted   Cough 09/26/2022   Screening for colon cancer 09/25/2022    Myalgia due to statin 08/31/2022   Sprain of right thumb 02/06/2022   Left foot pain 05/12/2021   Numbness of left foot 05/12/2021   COVID-19 virus infection 03/08/2021   Bilateral chronic knee pain 05/30/2018   Status post biventricular pacemaker 10/09/2017   S/P left rotator cuff repair 10/04/2017   Cervical radiculopathy 07/24/2017   Rotator cuff tendonitis, left 05/06/2017   Chronic combined systolic and diastolic heart failure ([place]) 12/18/2016   Non-ischemic cardiomyopathy ([place]) 12/04/2016   Iron deficiency anemia    History of bariatric surgery 11/20/2016   Toenail fungus 10/14/2015   RBBB 04/07/2015   LBBB (left bundle branch block) 09/24/2013   Obstructive apnea 09/24/2013   GERD 09/29/2010   ERECTILE DYSFUNCTION, 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   Hypertension associated with diabetes ([place]) 07/09/2007   Hyperlipidemia associated with type 2 diabetes mellitus ([place]) 06/12/2007   Type 2 diabetes mellitus without complication, with long-term current use of insulin ([place]) 06/04/2007    Immunization History  Administered Date(s) Administered   Influenza, Seasonal, Injecte, Preservative Fre 10/07/2012, 09/28/2013, 10/19/2016   Influenza,inj,Quad PF,6+ Mos 07/19/2015, 06/27/2017, 07/15/2018, 07/23/2019, 07/26/2020, 08/08/2021   Influenza-Unspecified 07/25/2016   PFIZER(Purple Top)SARS-COV-2 Vaccination 12/17/2019, 01/11/2020, 06/28/2020   PNEUMOCOCCAL CONJUGATE-20 12/13/2021   Pfizer Covid-19 Vaccine Bivalent Booster 59yr & up 12/13/2021   Pneumococcal Polysaccharide-23 10/07/2012, 12/24/2017   Tdap 02/27/2011   Zoster Recombinat (Shingrix) 09/23/2020, 03/31/2021     Compliance/Adherence/Medication fill history: Care Gaps: Foot exam (due 05/01/22)  Star-Rating Drugs: Metformin - PDC 100%  SDOH:  (Social Determinants of Health) assessments and interventions performed: {yes/no:20286} SDOH Interventions    Flowsheet Row Clinical Support [facility]20[facility] from 07/19/2020 in [facility] [facility] [place] Interventions Intervention Not Indicated --  Housing Interventions Intervention Not Indicated --  Transportation Interventions Intervention Not Indicated --  Utilities Interventions Intervention Not Indicated --  Alcohol Usage Interventions Intervention Not Indicated (Score <7) --  Depression Interventions/Treatment  -- PHQ2-9 Score <4 Follow-up Not Indicated  Financial Strain Interventions Intervention Not Indicated --  Physical Activity Interventions Intervention Not Indicated --  Stress Interventions Intervention Not Indicated --  Social Connections Interventions Intervention Not Indicated --      SDOH Screenings   Food Insecurity: No Food Insecurity (07/23/2022)  Housing: Low Risk  (07/23/2022)  Transportation Needs: No Transportation Needs (07/23/2022)  Utilities: Not At Risk (07/23/2022)  Alcohol Screen: Low Risk  (07/23/2022)  Depression (PHQ2-9): Low Risk  (07/23/2022)  Financial Resource Strain: Low Risk  (07/23/2022)  Physical Activity: Insufficiently Active (07/23/2022)  Social Connections: Moderately Integrated (07/23/2022)  Stress: No Stress Concern Present (07/23/2022)  Tobacco Use: Low Risk  (09/26/2022)    Medication Assistance: {MEDASSISTANCEINFO:25044}  Medication Access: Within the past 30 days, how often has patient missed a dose of medication? *** Is a pillbox or other method used to improve adherence? {YES/NO:21197} Factors that may affect medication adherence? {CHL DESC; BARRIERS:21522} Are meds synced by current pharmacy? {YES/NO:21197} Are meds delivered by current pharmacy? {YES/NO:21197} Does patient experience delays in picking up medications due to transportation concerns? {YES/NO:21197}  [facility] Services Reviewed: Is patient disadvantaged to use [facility]?: {YES/NO:21197} Current Rx insurance plan: *** Name and  location of Current pharmacy:  [place], [place] to [facility] [address] [place] [address] Phone: [phone] Fax: [phone]  [facility] services reviewed with patient today?: {YES/NO:21197} Patient requests to transfer care to [facility]?: {YES/NO:21197} Reason patient declined to change pharmacies: {US patient preference:27474}   Assessment/Plan  Hypertension / Heart Failure (BP goal <130/80) -Controlled - per clinic BP radings -Last ejection fraction: 40-50% (Date: 03/07/21) (improved from 30-35%) -HF type: HFmrEF (mildly reduced EF 41-49%)  -NYHA Class: II (slight limitation of activity) -s/p BIV ICD -Current home BP readings: not checking; Denies hypotensive/hypertensive symptoms including dizziness -Daily weight: 213 -Current treatment: Metoprolol succinate 100 mg daily - Appropriate, Effective, Safe, Accessible Digoxin 0.125 mg daily -Appropriate, Effective, Safe, Accessible Entresto 97-103 mg BID -Appropriate, Effective, Safe, Accessible Spironolactone 25 mg daily -Appropriate, Effective, Safe, Accessible -Medications previously tried: furosemide, isosorbide  -Educated on BP goals and benefits of medications for prevention of heart attack, stroke and kidney damage;Exercise goal of 150 minutes per week; -Educated on Benefits of medications for managing symptoms and prolonging life -Counseled to monitor BP at home periodically -Recommended to continue current medication  Hyperlipidemia: (LDL goal < 100) -Controlled - LDL is 38 without medication; pt has indication for statin (diabetes) but defers initiation -cath 11/2016 - no CAD. Hx statin intolerance -Current treatment: None -Educated on Cholesterol goals;  -Counseled on diet and exercise extensively  Diabetes (A1c goal <7%) -Uncontrolled - A1c 8.5% (07/2022); pt endorses compliance with current regimen  and denies frequent hypoglycemia - pt has hypoglycemic sx when BG is in 70s which happens less than once a month -h/o bariatric surgery 10/2016 -Current home glucose readings: Dexcom G7 (CGM training 09/07/22) Reviewed AGP report: 09/19/22 to 10/02/22. Sensor active: 88.5%  Time in range (70-180): 24% (goal > 70%)  High (180-250): 41%  Very  high (>250): 35%  Low (< 70): 0% (goal < 4%)  GMI: 8.7%; Average glucose: 227  -Current medications: Basaglar 18 units daily PM (6pm) - Appropriate, Query Effective Metformin ER 500 mg daily -Appropriate, Query Effective Testing supplies -Medications previously tried/failed: glipizide, Jardiance (12/2018-02/2019), NPH 70/30 -Current meal patterns: no sweets, breads -Current exercise: gym 3x a week -Educated on A1c and blood sugar goals; Prevention and management of hypoglycemic episodes; Benefits of routine self-monitoring of blood sugar; Sick day management: maintain hydration with high fluid intake and call MD if glucose above 400 -Consider necessity of insulin - pt may be able to control DM with other medications (SGLT2 or GLP-1) that have additional benefits and do not promote hypoglycemia; his insurance plan covers brand-name drugs at low copay (<$10) so cost would not be a barrier - pt deferred changes today but may consider at follow up -Recommended to continue current medication   Health Maintenance -Vaccine gaps: Prevnar, covid booster, TDAP   [name], PharmD, BCACP Clinical Pharmacist [facility] at [facility] [phone]

## 2022-10-02 NOTE — Telephone Encounter (Signed)
Care Management & Coordination Services Outreach Note  10/02/2022 Name: Philip Ponce MRN: [id number] DOB: 07/13/1971  Referred by: [name], MD  Patient had a phone appointment scheduled with clinical pharmacist today.  An unsuccessful telephone outreach was attempted today. The patient was referred to the pharmacist for assistance with medications, care management and care coordination.   Patient will NOT be penalized in any way for missing a Care Management & Coordination Services appointment. The no-show fee does not apply.  If possible, a message was left to return call to: [phone] or to [facility].  [name], PharmD, BCACP Clinical Pharmacist [facility] at [facility] [phone]

## 2022-10-05 DIAGNOSIS — I2510 Atherosclerotic heart disease of native coronary artery without angina pectoris: Secondary | ICD-10-CM | POA: Diagnosis not present

## 2022-10-05 DIAGNOSIS — F1010 Alcohol abuse, uncomplicated: Secondary | ICD-10-CM | POA: Insufficient documentation

## 2022-10-05 DIAGNOSIS — K219 Gastro-esophageal reflux disease without esophagitis: Secondary | ICD-10-CM | POA: Diagnosis not present

## 2022-10-05 DIAGNOSIS — I509 Heart failure, unspecified: Secondary | ICD-10-CM | POA: Diagnosis not present

## 2022-10-05 DIAGNOSIS — M1990 Unspecified osteoarthritis, unspecified site: Secondary | ICD-10-CM | POA: Diagnosis not present

## 2022-10-05 DIAGNOSIS — N529 Male erectile dysfunction, unspecified: Secondary | ICD-10-CM | POA: Diagnosis not present

## 2022-10-05 DIAGNOSIS — I429 Cardiomyopathy, unspecified: Secondary | ICD-10-CM | POA: Diagnosis not present

## 2022-10-05 DIAGNOSIS — E1151 Type 2 diabetes mellitus with diabetic peripheral angiopathy without gangrene: Secondary | ICD-10-CM | POA: Diagnosis not present

## 2022-10-05 DIAGNOSIS — I110 Hypertensive heart disease with heart failure: Secondary | ICD-10-CM | POA: Diagnosis not present

## 2022-10-05 DIAGNOSIS — Z794 Long term (current) use of insulin: Secondary | ICD-10-CM | POA: Diagnosis not present

## 2022-10-05 DIAGNOSIS — K5900 Constipation, unspecified: Secondary | ICD-10-CM | POA: Diagnosis not present

## 2022-10-05 DIAGNOSIS — Z8249 Family history of ischemic heart disease and other diseases of the circulatory system: Secondary | ICD-10-CM | POA: Diagnosis not present

## 2022-10-05 DIAGNOSIS — R32 Unspecified urinary incontinence: Secondary | ICD-10-CM | POA: Diagnosis not present

## 2022-10-05 DIAGNOSIS — Z008 Encounter for other general examination: Secondary | ICD-10-CM | POA: Diagnosis not present

## 2022-10-05 NOTE — Assessment & Plan Note (Signed)
Chronic, Recent inadequate control with poorly controlled A1c likely due to increased alcohol use.  He has stopped this now and is back on track with lifestyle changes. Continue metformin and Balsalglar for now.  If blood sugar is not improving we can consider Jardiance addition  You can discuss addition of Jardiance with cardiologist . Set up yearly eye exam for diabetes and have the opthalmologist send [place] a copy of the evaluation for the chart.\ Given information on how to contact pharmacist to learn how to use continuous glucose monitor.

## 2022-10-05 NOTE — Assessment & Plan Note (Signed)
Euvolemic in office  Today.  Entresto 97/103 mg daily   aldactone 25 mg daily dogoxin 0.125 mg daily  coreg 12.5 mg BID

## 2022-10-05 NOTE — Assessment & Plan Note (Signed)
Discussed complications alcohol abuse can cause with his current health issues including worsening cardiomyopathy and diabetes.  He reports he has not had alcohol in the last 3 weeks.  He feels his mood is doing much better now

## 2022-10-05 NOTE — Assessment & Plan Note (Signed)
LDL at goal on no medication.. myalgia associated with statin

## 2022-10-05 NOTE — Assessment & Plan Note (Signed)
Stable, chronic.  Continue current medication.   Entresto 97/103 mg daily   aldactone 25 mg daily dogoxin 0.125 mg daily  coreg 12.5 mg BID

## 2022-10-06 ENCOUNTER — Other Ambulatory Visit: Payer: Self-pay | Admitting: Family Medicine

## 2022-10-06 DIAGNOSIS — E119 Type 2 diabetes mellitus without complications: Secondary | ICD-10-CM

## 2022-10-12 ENCOUNTER — Other Ambulatory Visit: Payer: Self-pay | Admitting: Family Medicine

## 2022-10-12 DIAGNOSIS — E119 Type 2 diabetes mellitus without complications: Secondary | ICD-10-CM

## 2022-10-15 ENCOUNTER — Telehealth: Payer: Self-pay | Admitting: Family Medicine

## 2022-10-15 DIAGNOSIS — F321 Major depressive disorder, single episode, moderate: Secondary | ICD-10-CM

## 2022-10-15 DIAGNOSIS — F1010 Alcohol abuse, uncomplicated: Secondary | ICD-10-CM

## 2022-10-15 NOTE — Telephone Encounter (Signed)
Patient called and stated and asked can he get family therapist referral and that the therapist can be covered under him and his wife insurance. Call back number [phone].

## 2022-10-16 ENCOUNTER — Encounter: Payer: Self-pay | Admitting: Family Medicine

## 2022-10-16 ENCOUNTER — Telehealth: Payer: Self-pay | Admitting: Family Medicine

## 2022-10-16 NOTE — Telephone Encounter (Signed)
See Separate telephone encounter from today 10/16/22 in regards to same matter.

## 2022-10-16 NOTE — Telephone Encounter (Signed)
I will go ahead and place this referral but I do think he needs to come in to talk about medication treatment of likely depression.  Please give him the phone number for [facility] ( on pamphlet in office) he can call to make appt  there as well now referral has been placed.

## 2022-10-16 NOTE — Telephone Encounter (Signed)
I spoke with pts wife (DPR signed) and she said that pt did call office on 10/15/22 and ask for referral for family counseling in [place] area. Pts wife said that she is not fearful of harm to herself or teenage child but pts wife has noticed recently that pt is more short tempered, more defensive and easy and quick to anger.pts wife said she is not aware of a trigger or reason why pts mood has worsened. Pts wife said she works during the day and pt is at home. Pts wife said that pt is drinking about the same as when last saw Dr [name] in December 2023. She said pt is drinking beer but does not know how much pt is drinking daily. Pt also missed FU call for [facility] pharmacist and pts wife will give # to pt to return call and reschedule that phone appt at [phone]. Pts wife request cb after note reviewed by Dr [name] to see if could be interaction between some of pts med or what Dr [name] would suggest. I gave precaution to pt if pt condition changed or worsened to take pt to ED for eval and if anyone in pts family including pt felt threatened or in fear of their safety should leave situation and call appropriate authorities and also to contact our office. Pts wife said no SI/HI at this time. Sending note to Dr Bascom Levels pool and will teams Butch Penny who sent me the note and Claiborne Billings CMA who is working with Dr [name]

## 2022-10-16 NOTE — Telephone Encounter (Signed)
Patient wife Lattie Haw called in and was wanting to know if Dr. [name] could double check his medication list and make sure none of his meds are interacting with each other. She stated that she noticed some changes in his mood and was was concerned. She can be reached at [phone]. Thank you!

## 2022-10-16 NOTE — Progress Notes (Signed)
Remote ICD transmission.   

## 2022-10-16 NOTE — Telephone Encounter (Signed)
Spoke with Philip Ponce.  He states this is not related to what him and Dr. [name] discussed at his office visit.  They are looking more for a family counselor.  He states this relates to some things his daughter is doing and it's causing conflicts between him and his wife.  Would [place] still be appropriate for his or do we need to go in a different direction? Please advise.

## 2022-10-16 NOTE — Telephone Encounter (Signed)
I sent him a MyChart message to try to clarify what the issue was given I got information about him having an issue from his wife as well as his request for family counseling to include his daughter.  I am not sure if she is a minor.  If he does not respond please try to call and figure out what we need to do, thanks!

## 2022-10-16 NOTE — Telephone Encounter (Signed)
Call patient or patient's wife. I see no interaction between his medication.  I discussed with him at his last visit that depression and alcohol abuse had been going on but he had stated things have been improving.  Given it sounds as though his mood has declined again as well as his alcohol abuse.  Please have him make a appointment this week and we can discuss possibly starting a medication to improve his mood versus referral to counseling.  It would be ideal if the wife could come to the office visit with him.

## 2022-10-16 NOTE — Telephone Encounter (Signed)
I believe they do family counseling but his daughter's doctor may need to do the referral if she is a minor. Have him or his wife call to inquire. Please see the additional phone call from his wife.

## 2022-10-17 NOTE — Telephone Encounter (Signed)
Last read by [name] at 10:10 PM on 10/16/2022.

## 2022-10-17 NOTE — Telephone Encounter (Signed)
Spoke with Philip Ponce.  I scheduled an office visit for this Friday 10/19/22 at 9:00 am to discuss blood sugar concerns and counseling needs.  He states his wife is off on Friday so he will get her to come to the appointment so everyone can be on the same page and we can figure out what they need.

## 2022-10-19 ENCOUNTER — Ambulatory Visit (INDEPENDENT_AMBULATORY_CARE_PROVIDER_SITE_OTHER): Payer: Medicare HMO | Admitting: Family Medicine

## 2022-10-19 ENCOUNTER — Encounter: Payer: Self-pay | Admitting: Family Medicine

## 2022-10-19 VITALS — BP 100/64 | HR 78 | Temp 98.1°F | Ht 71.25 in | Wt 214.4 lb

## 2022-10-19 DIAGNOSIS — Z794 Long term (current) use of insulin: Secondary | ICD-10-CM | POA: Diagnosis not present

## 2022-10-19 DIAGNOSIS — F1010 Alcohol abuse, uncomplicated: Secondary | ICD-10-CM | POA: Diagnosis not present

## 2022-10-19 DIAGNOSIS — E119 Type 2 diabetes mellitus without complications: Secondary | ICD-10-CM

## 2022-10-19 DIAGNOSIS — Z638 Other specified problems related to primary support group: Secondary | ICD-10-CM

## 2022-10-19 DIAGNOSIS — R69 Illness, unspecified: Secondary | ICD-10-CM | POA: Diagnosis not present

## 2022-10-19 DIAGNOSIS — Z23 Encounter for immunization: Secondary | ICD-10-CM

## 2022-10-19 NOTE — Assessment & Plan Note (Signed)
Now in early remission.  He seems to be doing well reducing this to have encouraged him to quit alcohol altogether.  His wife is present today.

## 2022-10-19 NOTE — Patient Instructions (Addendum)
Get back to regular exercise, work on low carb diet.  Increase metformin XR  2 in AM... send message with blood sugars fastin, 2 hour after meal, average... in 2 weeks.  Continue Balsalglar 20 Units daily.

## 2022-10-19 NOTE — Assessment & Plan Note (Signed)
Acute, we discussed continuing to move forward with a referral for family counseling.  This seems to be the major issue at this point adding to  stress and likely causing an additional increase in cortisol resulting in worsening diabetes control.  The family cannot see his daughters counselor as his insurance is not excepted.  I will again contact our referral coordinator or the behavioral health team to see what options he has.  He does state he is willing to wait several months if needed but would ideally like a sooner appointment.  We did discuss possible counseling from their church pastor.

## 2022-10-19 NOTE — Progress Notes (Signed)
Patient ID: Philip Ponce, male    DOB: 1971-05-12, 52 y.o.   MRN: [id number]  This visit was conducted in person.  BP 100/64   Pulse 78   Temp 98.1 F (36.7 C) (Oral)   Ht 5' 11.25" (1.81 m)   Wt 214 lb 6 oz (97.2 kg)   SpO2 98%   BMI 29.69 kg/m    CC:  Chief Complaint  Patient presents with   Diabetes    Wants to discuss elevated blood sugars   Discuss Counseling Needs    Subjective:   HPI: Philip Ponce is a 52 y.o. male presenting on 10/19/2022 for Diabetes (Wants to discuss elevated blood sugars) and Discuss Counseling Needs  Diabetes: metformin XR 500 mg daily and Basaglar  20 units daily   Given cardiomyopathy and CHF.Marland Kitchen will consider jardiance addition for improved control. Lab Results  Component Value Date   HGBA1C 8.5 (H) 08/03/2022  Using medications without difficulties: Hypoglycemic episodes: rare Hyperglycemic episodes: yes  Blood Sugars averaging: Has set up CGM.Marland Kitchen only 10% in range... 62 % very high. Lows uncommon.  Average 242 in last 30 days.  Wt Readings from Last 3 Encounters:  10/19/22 214 lb 6 oz (97.2 kg)  09/26/22 214 lb (97.1 kg)  09/25/22 208 lb 12.8 oz (94.7 kg)     Today he comes to appt with his wife.  He has been more stressed lately. He has a daughter who is 70, they recently have been butting heads.  He has trouble with her not respecting his authority and he does not feel listened to.  Her mother often tries to be a mediator but is having difficulty.  He does have a history of alcohol abuse especially when he was feeling more depressed after his mother passed away.  He states his depression has improved significantly.  He does still drink alcohol about 24 oz 1-2 beer a week.        10/19/2022    9:08 AM 07/23/2022   11:34 AM 07/20/2021   12:15 PM  PHQ9 SCORE ONLY  PHQ-9 Total Score 4 0 0      Relevant past medical, surgical, family and social history reviewed and updated as indicated. Interim medical history  since our last visit reviewed. Allergies and medications reviewed and updated. Outpatient Medications Prior to Visit  Medication Sig Dispense Refill   B-D ULTRAFINE III SHORT PEN 31G X 8 MM MISC USE TO INJECT INSULIN DAILY 90 each 3   benzonatate (TESSALON) 100 MG capsule Take 1 capsule (100 mg total) by mouth 3 (three) times daily as needed for cough. 30 capsule 0   Blood Glucose Monitoring Suppl (ONE TOUCH ULTRA 2) w/Device KIT Use to check blood sugar up to 2 times a day 1 kit 0   calcium carbonate (OS-CAL) 600 MG TABS tablet Take 1,200 mg by mouth daily with breakfast.      Continuous Blood Gluc Sensor (DEXCOM G7 SENSOR) MISC by Does not apply route.     digoxin (LANOXIN) 0.125 MG tablet Take 1 tablet (125 mcg total) by mouth daily. 30 tablet 3   glucose blood (ONE TOUCH ULTRA TEST) test strip Check blood sugar up to 2 times a day and as instructed. Dx E11.9 100 each 5   Insulin Glargine (BASAGLAR KWIKPEN) 100 UNIT/ML INJECT 18 UNITS            SUBCUTANEOUSLY DAILY 15 mL 3   Lancets (ONETOUCH ULTRASOFT) lancets Use to check blood  sugar up to 2 times a day. 100 each 5   metFORMIN (GLUCOPHAGE-XR) 500 MG 24 hr tablet TAKE 1 TABLET DAILY WITH   BREAKFAST 90 tablet 3   metoprolol succinate (TOPROL-XL) 100 MG 24 hr tablet Take 1 tablet (100 mg total) by mouth daily. Take with or immediately following a meal. 90 tablet 1   Multiple Vitamins-Minerals (ADULT GUMMY PO) Take 2 tablets by mouth daily.     omeprazole (PRILOSEC) 40 MG capsule TAKE 1 CAPSULE DAILY AS    NEEDED FOR INDIGESTION 90 capsule 3   OVER THE COUNTER MEDICATION SEA MOSS SUPPLEMENT     sacubitril-valsartan (ENTRESTO) 97-103 MG Take 1 tablet by mouth 2 (two) times daily. 180 tablet 3   spironolactone (ALDACTONE) 25 MG tablet Take 1 tablet (25 mg total) by mouth daily. 90 tablet 3   trimethoprim-polymyxin b (POLYTRIM) ophthalmic solution Place 2 drops into the right eye every 6 (six) hours.     No facility-administered medications  prior to visit.     Per HPI unless specifically indicated in ROS section below Review of Systems  Constitutional:  Negative for fatigue and fever.  HENT:  Negative for ear pain.   Eyes:  Negative for pain.  Respiratory:  Negative for cough and shortness of breath.   Cardiovascular:  Negative for chest pain, palpitations and leg swelling.  Gastrointestinal:  Negative for abdominal pain.  Genitourinary:  Negative for dysuria.  Musculoskeletal:  Negative for arthralgias.  Neurological:  Negative for syncope, light-headedness and headaches.  Psychiatric/Behavioral:  Negative for dysphoric mood.    Objective:  BP 100/64   Pulse 78   Temp 98.1 F (36.7 C) (Oral)   Ht 5' 11.25" (1.81 m)   Wt 214 lb 6 oz (97.2 kg)   SpO2 98%   BMI 29.69 kg/m   Wt Readings from Last 3 Encounters:  10/19/22 214 lb 6 oz (97.2 kg)  09/26/22 214 lb (97.1 kg)  09/25/22 208 lb 12.8 oz (94.7 kg)      Physical Exam Constitutional:      Appearance: He is well-developed.  HENT:     Head: Normocephalic.     Right Ear: Hearing normal.     Left Ear: Hearing normal.     Nose: Nose normal.  Neck:     Thyroid: No thyroid mass or thyromegaly.     Vascular: No carotid bruit.     Trachea: Trachea normal.  Cardiovascular:     Rate and Rhythm: Normal rate and regular rhythm.     Pulses: Normal pulses.     Heart sounds: Heart sounds not distant. No murmur heard.    No friction rub. No gallop.     Comments: No peripheral edema Pulmonary:     Effort: Pulmonary effort is normal. No respiratory distress.     Breath sounds: Normal breath sounds.  Skin:    General: Skin is warm and dry.     Findings: No rash.  Psychiatric:        Speech: Speech normal.        Behavior: Behavior normal.        Thought Content: Thought content normal.       Results for orders placed or performed in visit on 09/28/22  CUP PACEART REMOTE DEVICE CHECK  Result Value Ref Range   Date Time Interrogation Session [id number]     Pulse Generator Manufacturer MERM    Pulse Gen Model [place] MRI Quad CRT-D    Pulse Gen Serial Number  [id number] [facility]    Implantable Pulse Generator Type Cardiac Resynch Therapy Defibulator    Implantable Pulse Generator Implant Date 80034917    Implantable Lead Manufacturer MERM    Implantable Lead Model 4398 Attain Performa Straight MRI SureScan    Implantable Lead Serial Number [id number]    Implantable Lead Implant Date 91505697    Implantable Lead Location Detail 1 UNKNOWN    Implantable Lead Location [id number]    Implantable Lead Connection Status [id number]    Implantable Lead Manufacturer MERM    Implantable Lead Model 5076 CapSureFix Novus MRI SureScan    Implantable Lead Serial Number [id number]    Implantable Lead Implant Date 48270786    Implantable Lead Location Detail 1 APPENDAGE    Implantable Lead Location [id number]    Implantable Lead Connection Status [id number]    Implantable Lead Manufacturer [facility]    Implantable Lead Model [id number] Sprint Quattro Secure S MRI SureScan    Implantable Lead Serial Number [id number]    Implantable Lead Implant Date 20100712    Implantable Lead Location Detail 1 APEX    Implantable Lead Location [id number]    Implantable Lead Connection Status [id number]    Lead Channel Setting Sensing Sensitivity 0.3 mV   Lead Channel Setting Pacing Amplitude 2 V   Lead Channel Setting Pacing Pulse Width 0.4 ms   Lead Channel Setting Pacing Amplitude 1.5 V   Lead Channel Setting Pacing Pulse Width 0.4 ms   Lead Channel Setting Pacing Amplitude 1.5 V   Lead Channel Setting Pacing Capture Mode Fixed Pacing    Zone Setting Status Active    Zone Setting Status Inactive    Zone Setting Status Inactive    Zone Setting Status 755011    Zone Setting Status [id number]    Lead Channel Impedance Value 418 ohm   Lead Channel Sensing Intrinsic Amplitude 2 mV   Lead Channel Sensing Intrinsic Amplitude 2 mV   Lead Channel Pacing Threshold Amplitude  0.625 V   Lead Channel Pacing Threshold Pulse Width 0.4 ms   Lead Channel Impedance Value 342 ohm   Lead Channel Impedance Value 285 ohm   Lead Channel Sensing Intrinsic Amplitude 8.25 mV   Lead Channel Sensing Intrinsic Amplitude 8.25 mV   Lead Channel Pacing Threshold Amplitude 0.625 V   Lead Channel Pacing Threshold Pulse Width 0.4 ms   HighPow Impedance 55 ohm   Lead Channel Impedance Value 760 ohm   Lead Channel Impedance Value 703 ohm   Lead Channel Impedance Value 608 ohm   Lead Channel Impedance Value 665 ohm   Lead Channel Impedance Value 722 ohm   Lead Channel Impedance Value 646 ohm   Lead Channel Impedance Value 399 ohm   Lead Channel Impedance Value 513 ohm   Lead Channel Impedance Value 418 ohm   Lead Channel Impedance Value 342 ohm   Lead Channel Impedance Value 224.438    Lead Channel Impedance Value 204.14 ohm   Lead Channel Impedance Value 184.154    Lead Channel Impedance Value 205.2 ohm   Lead Channel Impedance Value 188.1 ohm   Lead Channel Pacing Threshold Amplitude 1.25 V   Lead Channel Pacing Threshold Pulse Width 0.4 ms   Battery Status OK    Battery Remaining Longevity 22 mo   Battery Voltage 2.92 V   Brady Statistic RA Percent Paced 0.01 %   Brady Statistic RV Percent Paced 84.55 %   Brady Statistic AP VP Percent 0.01 %  Brady Statistic AS VP Percent 98.72 %   Brady Statistic AP VS Percent 0 %   Brady Statistic AS VS Percent 1.27 %    Assessment and Plan  Type 2 diabetes mellitus without complication, with long-term current use of insulin (HCC) Assessment & Plan: Chronic, poor control, We discussed diet changes eliminating sugar and carbohydrates in diet.  Discussed healthy breakfast options (he had a biscuit before coming to the office today and noted his blood sugar above 260) as well as ways to decrease sugar in his drinks.  I counseled against alcohol use but encouraged him to continue to do at least reduce.  Jardiance would be a good option  in him but given he has not maxed out his metformin we will start by increasing metformin 500 XR to 2 capsules daily in addition to having him continue his insulin.  He will call with blood sugar levels and follow-up in approximately 2 weeks.  At that time we may potentially increase the metformin further.   Need for Td vaccine -     Td vaccine greater than or equal to 7yo preservative free IM  Alcohol abuse Assessment & Plan: Now in early remission.  He seems to be doing well reducing this to have encouraged him to quit alcohol altogether.  His wife is present today.   Stress due to family tension Assessment & Plan: Acute, we discussed continuing to move forward with a referral for family counseling.  This seems to be the major issue at this point adding to  stress and likely causing an additional increase in cortisol resulting in worsening diabetes control.  The family cannot see his daughters counselor as his insurance is not excepted.  I will again contact our referral coordinator or the behavioral health team to see what options he has.  He does state he is willing to wait several months if needed but would ideally like a sooner appointment.  We did discuss possible counseling from their church pastor.     No follow-ups on file.   Eliezer Lofts, MD

## 2022-10-19 NOTE — Assessment & Plan Note (Addendum)
Chronic, poor control, We discussed diet changes eliminating sugar and carbohydrates in diet.  Discussed healthy breakfast options (he had a biscuit before coming to the office today and noted his blood sugar above 260) as well as ways to decrease sugar in his drinks.  I counseled against alcohol use but encouraged him to continue to do at least reduce.  Jardiance would be a good option in him but given he has not maxed out his metformin we will start by increasing metformin 500 XR to 2 capsules daily in addition to having him continue his insulin.  He will call with blood sugar levels and follow-up in approximately 2 weeks.  At that time we may potentially increase the metformin further.

## 2022-10-22 DIAGNOSIS — S39011A Strain of muscle, fascia and tendon of abdomen, initial encounter: Secondary | ICD-10-CM | POA: Diagnosis not present

## 2022-10-22 DIAGNOSIS — R109 Unspecified abdominal pain: Secondary | ICD-10-CM | POA: Diagnosis not present

## 2022-11-01 ENCOUNTER — Telehealth: Payer: Self-pay | Admitting: Pharmacist

## 2022-11-01 NOTE — Telephone Encounter (Signed)
Noted  

## 2022-11-01 NOTE — Telephone Encounter (Signed)
Patient has requested refills for Dexcom G7 sensors. He gets these through [facility] [phone]). Will call [facility] to request refill.

## 2022-11-01 NOTE — Telephone Encounter (Cosign Needed)
Contacted [facility], the company took a verbal refill order. [facility] to fax request for last office visit for the patient today. [facility] to fax the main [facility] #. Order to be sent out after they receive the note per DME department.   Avel Sensor, [place] Assistant [phone]

## 2022-11-05 DIAGNOSIS — Z794 Long term (current) use of insulin: Secondary | ICD-10-CM | POA: Diagnosis not present

## 2022-11-05 DIAGNOSIS — E1165 Type 2 diabetes mellitus with hyperglycemia: Secondary | ICD-10-CM | POA: Diagnosis not present

## 2022-11-30 ENCOUNTER — Encounter: Payer: Self-pay | Admitting: Family Medicine

## 2022-11-30 ENCOUNTER — Ambulatory Visit (INDEPENDENT_AMBULATORY_CARE_PROVIDER_SITE_OTHER): Payer: Medicare HMO | Admitting: Family Medicine

## 2022-11-30 VITALS — BP 110/80 | HR 78 | Temp 97.9°F | Ht 71.25 in | Wt 211.4 lb

## 2022-11-30 DIAGNOSIS — E119 Type 2 diabetes mellitus without complications: Secondary | ICD-10-CM | POA: Diagnosis not present

## 2022-11-30 DIAGNOSIS — I152 Hypertension secondary to endocrine disorders: Secondary | ICD-10-CM | POA: Diagnosis not present

## 2022-11-30 DIAGNOSIS — Z794 Long term (current) use of insulin: Secondary | ICD-10-CM

## 2022-11-30 DIAGNOSIS — E1159 Type 2 diabetes mellitus with other circulatory complications: Secondary | ICD-10-CM | POA: Diagnosis not present

## 2022-11-30 DIAGNOSIS — I5042 Chronic combined systolic (congestive) and diastolic (congestive) heart failure: Secondary | ICD-10-CM

## 2022-11-30 LAB — POCT GLYCOSYLATED HEMOGLOBIN (HGB A1C): Hemoglobin A1C: 7.6 % — AB (ref 4.0–5.6)

## 2022-11-30 LAB — HM DIABETES FOOT EXAM

## 2022-11-30 MED ORDER — METFORMIN HCL ER 500 MG PO TB24: 500.0000 mg | ORAL_TABLET | Freq: Two times a day (BID) | ORAL | 3 refills | Status: DC

## 2022-11-30 MED ORDER — EMPAGLIFLOZIN 10 MG PO TABS: 10.0000 mg | ORAL_TABLET | Freq: Every day | ORAL | 11 refills | Status: DC

## 2022-11-30 NOTE — Progress Notes (Signed)
Patient ID: Philip Ponce, male    DOB: 05-Sep-1971, 52 y.o.   MRN: [id number]  This visit was conducted in person.  BP 110/80   Pulse 78   Temp 97.9 F (36.6 C) (Temporal)   Ht 5' 11.25" (1.81 m)   Wt 211 lb 6 oz (95.9 kg)   SpO2 99%   BMI 29.27 kg/m    CC:  Chief Complaint  Patient presents with   Diabetes    Subjective:   HPI: Philip Ponce is a 52 y.o. male presenting on 11/30/2022 for Diabetes  Diabetes: Improving control on metformin XR 1000 mg ( increased at last OV) daily and Basaglar  20 units daily   Given cardiomyopathy and CHF.Marland Kitchen will consider jardiance addition for improved control. Lab Results  Component Value Date   HGBA1C 7.6 (A) 11/30/2022  Using medications without difficulties:   Some increase in frequent stool and rushing to bathroom with higher dose of metformin. Hypoglycemic episodes:  none Hyperglycemic episodes: yes  Exercise: gym 3-4 times week  Blood Sugars averaging: Has set up CGM but has had issues with it lately.Marland Kitchen only 42% in range... 20 % very high. ( This has improved some from last office visit where 62% was very high only 10% in range) Lows uncommon.   Wt Readings from Last 3 Encounters:  11/30/22 211 lb 6 oz (95.9 kg)  10/19/22 214 lb 6 oz (97.2 kg)  09/26/22 214 lb (97.1 kg)      Alcohol abuse:  now in remission per pt.  Home life stress/ family issues:  Has not been able to set up cpounseling.        10/19/2022    9:08 AM 07/23/2022   11:34 AM 07/20/2021   12:15 PM  PHQ9 SCORE ONLY  PHQ-9 Total Score 4 0 0      Relevant past medical, surgical, family and social history reviewed and updated as indicated. Interim medical history since our last visit reviewed. Allergies and medications reviewed and updated. Outpatient Medications Prior to Visit  Medication Sig Dispense Refill   B-D ULTRAFINE III SHORT PEN 31G X 8 MM MISC USE TO INJECT INSULIN DAILY 90 each 3   Blood Glucose Monitoring Suppl (ONE TOUCH ULTRA 2)  w/Device KIT Use to check blood sugar up to 2 times a day 1 kit 0   calcium carbonate (OS-CAL) 600 MG TABS tablet Take 1,200 mg by mouth daily with breakfast.      Continuous Blood Gluc Sensor (DEXCOM G7 SENSOR) MISC by Does not apply route.     digoxin (LANOXIN) 0.125 MG tablet Take 1 tablet (125 mcg total) by mouth daily. 30 tablet 3   glucose blood (ONE TOUCH ULTRA TEST) test strip Check blood sugar up to 2 times a day and as instructed. Dx E11.9 100 each 5   Insulin Glargine (BASAGLAR KWIKPEN) 100 UNIT/ML Inject 20 Units into the skin daily.     Lancets (ONETOUCH ULTRASOFT) lancets Use to check blood sugar up to 2 times a day. 100 each 5   metoprolol succinate (TOPROL-XL) 100 MG 24 hr tablet Take 1 tablet (100 mg total) by mouth daily. Take with or immediately following a meal. 90 tablet 1   Multiple Vitamins-Minerals (ADULT GUMMY PO) Take 2 tablets by mouth daily.     omeprazole (PRILOSEC) 40 MG capsule TAKE 1 CAPSULE DAILY AS    NEEDED FOR INDIGESTION 90 capsule 3   OVER THE COUNTER MEDICATION SEA MOSS SUPPLEMENT  sacubitril-valsartan (ENTRESTO) 97-103 MG Take 1 tablet by mouth 2 (two) times daily. 180 tablet 3   spironolactone (ALDACTONE) 25 MG tablet Take 1 tablet (25 mg total) by mouth daily. 90 tablet 3   metFORMIN (GLUCOPHAGE-XR) 500 MG 24 hr tablet TAKE 1 TABLET DAILY WITH   BREAKFAST 90 tablet 3   benzonatate (TESSALON) 100 MG capsule Take 1 capsule (100 mg total) by mouth 3 (three) times daily as needed for cough. 30 capsule 0   Insulin Glargine (BASAGLAR KWIKPEN) 100 UNIT/ML INJECT 18 UNITS            SUBCUTANEOUSLY DAILY (Patient taking differently: INJECT 20 UNITS SUBCUTANEOUSLY DAILY) 15 mL 3   trimethoprim-polymyxin b (POLYTRIM) ophthalmic solution Place 2 drops into the right eye every 6 (six) hours.     No facility-administered medications prior to visit.     Per HPI unless specifically indicated in ROS section below Review of Systems  Constitutional:  Negative for  fatigue and fever.  HENT:  Negative for ear pain.   Eyes:  Negative for pain.  Respiratory:  Negative for cough and shortness of breath.   Cardiovascular:  Negative for chest pain, palpitations and leg swelling.  Gastrointestinal:  Negative for abdominal pain.  Genitourinary:  Negative for dysuria.  Musculoskeletal:  Negative for arthralgias.  Neurological:  Negative for syncope, light-headedness and headaches.  Psychiatric/Behavioral:  Negative for dysphoric mood.    Objective:  BP 110/80   Pulse 78   Temp 97.9 F (36.6 C) (Temporal)   Ht 5' 11.25" (1.81 m)   Wt 211 lb 6 oz (95.9 kg)   SpO2 99%   BMI 29.27 kg/m   Wt Readings from Last 3 Encounters:  11/30/22 211 lb 6 oz (95.9 kg)  10/19/22 214 lb 6 oz (97.2 kg)  09/26/22 214 lb (97.1 kg)      Physical Exam Constitutional:      Appearance: He is well-developed.  HENT:     Head: Normocephalic.     Right Ear: Hearing normal.     Left Ear: Hearing normal.     Nose: Nose normal.  Neck:     Thyroid: No thyroid mass or thyromegaly.     Vascular: No carotid bruit.     Trachea: Trachea normal.  Cardiovascular:     Rate and Rhythm: Normal rate and regular rhythm.     Pulses: Normal pulses.     Heart sounds: Heart sounds not distant. No murmur heard.    No friction rub. No gallop.     Comments: No peripheral edema Pulmonary:     Effort: Pulmonary effort is normal. No respiratory distress.     Breath sounds: Normal breath sounds.  Skin:    General: Skin is warm and dry.     Findings: No rash.  Psychiatric:        Speech: Speech normal.        Behavior: Behavior normal.        Thought Content: Thought content normal.   Diabetic foot exam: Normal inspection No skin breakdown No calluses  Normal DP pulses Normal sensation to light touch and monofilament Nails normal     Results for orders placed or performed in visit on 11/30/22  POCT glycosylated hemoglobin (Hb A1C)  Result Value Ref Range   Hemoglobin A1C 7.6  (A) 4.0 - 5.6 %   HbA1c POC (<> result, manual entry)     HbA1c, POC (prediabetic range)     HbA1c, POC (controlled diabetic range)  HM DIABETES FOOT EXAM  Result Value Ref Range   HM Diabetic Foot Exam done     Assessment and Plan  Type 2 diabetes mellitus without complication, with long-term current use of insulin (HCC) Assessment & Plan: Chronic, improving control with increase in metformin XR to 500 mg p.o. twice daily, but patient with some increase in bowel movements and near incontinence.  He is willing to continue the metformin but we will have the goal over time to decrease back down to 1 daily if able to Continue Basaglar 20 units daily Will start Jardiance 10 mg p.o. daily with intent to titrate up over time.  Return for reevaluation in 3 months.   Orders: -     POCT glycosylated hemoglobin (Hb A1C)  Chronic combined systolic and diastolic heart failure (HCC) Assessment & Plan: Chronic, followed by cardiology.  Given need for better diabetes control in patient with congestive heart failure and normal kidney function I will initiate Jardiance 10 mg daily with plan to increase.Marland Kitchen   Hypertension associated with diabetes ([place]) Assessment & Plan: Stable, chronic.  Continue current medication.   Entresto 97/103 mg daily   aldactone 25 mg daily dogoxin 0.125 mg daily  coreg 12.5 mg BID   Other orders -     Empagliflozin; Take 1 tablet (10 mg total) by mouth daily before breakfast.  Dispense: 30 tablet; Refill: 11 -     metFORMIN HCl ER; Take 1 tablet (500 mg total) by mouth 2 (two) times daily with a meal.  Dispense: 180 tablet; Refill: 3    Return in about 3 months (around 03/02/2023) for diabetes follow up with fasting.   Eliezer Lofts, MD

## 2022-11-30 NOTE — Patient Instructions (Addendum)
Continue the metformin XR 500 mg twice daily for now and add Jardiance 10 mg daily Goal overtime to decrease metformin given some GI side effects.

## 2022-11-30 NOTE — Assessment & Plan Note (Signed)
Chronic, followed by cardiology.  Given need for better diabetes control in patient with congestive heart failure and normal kidney function I will initiate Jardiance 10 mg daily with plan to increase.Philip Ponce

## 2022-11-30 NOTE — Assessment & Plan Note (Signed)
Chronic, improving control with increase in metformin XR to 500 mg p.o. twice daily, but patient with some increase in bowel movements and near incontinence.  He is willing to continue the metformin but we will have the goal over time to decrease back down to 1 daily if able to Continue Basaglar 20 units daily Will start Jardiance 10 mg p.o. daily with intent to titrate up over time.  Return for reevaluation in 3 months.

## 2022-11-30 NOTE — Assessment & Plan Note (Signed)
Stable, chronic.  Continue current medication.   Entresto 97/103 mg daily   aldactone 25 mg daily dogoxin 0.125 mg daily  coreg 12.5 mg BID 

## 2022-12-04 ENCOUNTER — Encounter: Payer: Self-pay | Admitting: Family Medicine

## 2022-12-05 DIAGNOSIS — E1165 Type 2 diabetes mellitus with hyperglycemia: Secondary | ICD-10-CM | POA: Diagnosis not present

## 2022-12-05 DIAGNOSIS — Z794 Long term (current) use of insulin: Secondary | ICD-10-CM | POA: Diagnosis not present

## 2022-12-28 ENCOUNTER — Ambulatory Visit (INDEPENDENT_AMBULATORY_CARE_PROVIDER_SITE_OTHER): Payer: Medicare HMO

## 2022-12-28 DIAGNOSIS — I428 Other cardiomyopathies: Secondary | ICD-10-CM

## 2022-12-28 LAB — CUP PACEART REMOTE DEVICE CHECK
Battery Remaining Longevity: 20 mo
Battery Voltage: 2.92 V
Brady Statistic AP VP Percent: 0.01 %
Brady Statistic AP VS Percent: 0 %
Brady Statistic AS VP Percent: 98.73 %
Brady Statistic AS VS Percent: 1.25 %
Brady Statistic RA Percent Paced: 0.01 %
Brady Statistic RV Percent Paced: 85.53 %
Date Time Interrogation Session: 20240405031804
HighPow Impedance: 57 Ohm
Implantable Lead Connection Status: 753985
Implantable Lead Connection Status: 753985
Implantable Lead Connection Status: 753985
Implantable Lead Implant Date: 20181003
Implantable Lead Implant Date: 20181003
Implantable Lead Implant Date: 20181003
Implantable Lead Location: 753858
Implantable Lead Location: 753859
Implantable Lead Location: 753860
Implantable Lead Model: 4398
Implantable Lead Model: 5076
Implantable Pulse Generator Implant Date: 20181003
Lead Channel Impedance Value: 172.541
Lead Channel Impedance Value: 176 Ohm
Lead Channel Impedance Value: 190.884
Lead Channel Impedance Value: 204.14 Ohm
Lead Channel Impedance Value: 224.438
Lead Channel Impedance Value: 285 Ohm
Lead Channel Impedance Value: 304 Ohm
Lead Channel Impedance Value: 342 Ohm
Lead Channel Impedance Value: 399 Ohm
Lead Channel Impedance Value: 418 Ohm
Lead Channel Impedance Value: 418 Ohm
Lead Channel Impedance Value: 513 Ohm
Lead Channel Impedance Value: 608 Ohm
Lead Channel Impedance Value: 608 Ohm
Lead Channel Impedance Value: 646 Ohm
Lead Channel Impedance Value: 703 Ohm
Lead Channel Impedance Value: 722 Ohm
Lead Channel Impedance Value: 779 Ohm
Lead Channel Pacing Threshold Amplitude: 0.625 V
Lead Channel Pacing Threshold Amplitude: 0.625 V
Lead Channel Pacing Threshold Amplitude: 1.25 V
Lead Channel Pacing Threshold Pulse Width: 0.4 ms
Lead Channel Pacing Threshold Pulse Width: 0.4 ms
Lead Channel Pacing Threshold Pulse Width: 0.4 ms
Lead Channel Sensing Intrinsic Amplitude: 2 mV
Lead Channel Sensing Intrinsic Amplitude: 2 mV
Lead Channel Sensing Intrinsic Amplitude: 9.125 mV
Lead Channel Sensing Intrinsic Amplitude: 9.125 mV
Lead Channel Setting Pacing Amplitude: 1.5 V
Lead Channel Setting Pacing Amplitude: 1.5 V
Lead Channel Setting Pacing Amplitude: 2 V
Lead Channel Setting Pacing Pulse Width: 0.4 ms
Lead Channel Setting Pacing Pulse Width: 0.4 ms
Lead Channel Setting Sensing Sensitivity: 0.3 mV
Zone Setting Status: 755011
Zone Setting Status: 755011

## 2023-01-03 ENCOUNTER — Telehealth: Payer: Self-pay | Admitting: Family Medicine

## 2023-01-03 NOTE — Telephone Encounter (Signed)
A new referral will need to be placed OR the patient can reach out to Behavioral Health to schedule.   They spoke with him back in January (10/17/22) and advised that he needed to complete and send back paperwork before they scheduled his appointment. It looks like the referral was closed due to them never receiving the paperwork, docusign or call back to schedule.  Msg was sent directly to PCP 10/24/2022 explaining that they were waiting on the patient to complete his part before they could schedule.   Patient needs to contact  [facility] directly to see what else is needed and/or to schedule.  Phone: [phone]  They may need a new referral.   ----- Message ----- From: Molli Hazard Sent: 10/24/2022   8:47 AM EST To: Excell Seltzer, MD   Dr. Ermalene Searing,    Hello, I did speak to [place] on 1/24 and sent out the required paperwork to him, his wife and child we are required to have all three sets prior to scheduling an appointment. Once completed we will get them scheduled in our next opening.

## 2023-01-03 NOTE — Telephone Encounter (Signed)
   Reason for Referral Request: Urgent Referral, Patient is requesting behavioral health counseling   Has patient been seen PCP for this complaint? Yes  No,  please schedule patient for appointment for complaint.  Patient scheduled on:   Yes, please find out following information.  Referral for which specialty: Behavioral health, patient would like to see a psychologist   Preferred office/provider: No pref, [place] area pref, or [place]

## 2023-01-04 DIAGNOSIS — Z794 Long term (current) use of insulin: Secondary | ICD-10-CM | POA: Diagnosis not present

## 2023-01-04 DIAGNOSIS — E1165 Type 2 diabetes mellitus with hyperglycemia: Secondary | ICD-10-CM | POA: Diagnosis not present

## 2023-01-08 ENCOUNTER — Encounter: Payer: Self-pay | Admitting: Oncology

## 2023-01-23 ENCOUNTER — Encounter: Payer: Self-pay | Admitting: Family Medicine

## 2023-01-23 ENCOUNTER — Ambulatory Visit (INDEPENDENT_AMBULATORY_CARE_PROVIDER_SITE_OTHER): Payer: Medicare HMO | Admitting: Family Medicine

## 2023-01-23 VITALS — BP 104/80 | HR 78 | Temp 97.5°F | Ht 71.25 in | Wt 209.0 lb

## 2023-01-23 DIAGNOSIS — R42 Dizziness and giddiness: Secondary | ICD-10-CM | POA: Diagnosis not present

## 2023-01-23 DIAGNOSIS — E119 Type 2 diabetes mellitus without complications: Secondary | ICD-10-CM | POA: Diagnosis not present

## 2023-01-23 DIAGNOSIS — Z794 Long term (current) use of insulin: Secondary | ICD-10-CM | POA: Diagnosis not present

## 2023-01-23 NOTE — Patient Instructions (Addendum)
Dizziness likely due to low blood sugars and possibly due to mild dehydration. Continue healthy bedtime snack with protein and fiber.  Decrease Metfomrin XL back to 500 mg once daily.  Continue Balsalglar 18  Units daily.  Continue Jardiance at 10 mg daily.  Keep up with water intake.  Follow up as scheduled  6/14 for recheck of  A1C.

## 2023-01-23 NOTE — Assessment & Plan Note (Signed)
Dizziness likely due to low blood sugars and possibly due to mild dehydration. Continue healthy bedtime snack with protein and fiber.  Decrease Metfomrin XL back to 500 mg once daily.  Continue Balsalglar 18  Units daily.  Continue Jardiance at 10 mg daily.  Keep up with water intake.  Follow up as scheduled  6/14 for recheck of  A1C. 

## 2023-01-23 NOTE — Progress Notes (Signed)
Patient ID: Philip Ponce, male    DOB: Feb 16, 1971, 52 y.o.   MRN: [id number]  This visit was conducted in person.  BP 104/80 (BP Location: Left Arm, Patient Position: Sitting, Cuff Size: Normal)   Pulse 78   Temp (!) 97.5 F (36.4 C) (Temporal)   Ht 5' 11.25" (1.81 m)   Wt 209 lb (94.8 kg)   SpO2 97%   BMI 28.95 kg/m    CC:  Chief Complaint  Patient presents with   Dizziness    Off and on since starting Jardiance. Mainly happens in the mornings.    Subjective:   HPI: Philip Ponce is a 52 y.o. male presenting on 01/23/2023 for Dizziness (Off and on since starting Jardiance. Mainly happens in the mornings.)  New onset dizziness off and on since starting Jardiance... Started  11/2022 given diabetes and congestive heart failure history.  No CP, no SOB.  On Basaglar 20 units daily.. has decreased to 18 Metformin XL 500 mg twice daily... no GI upset.   He has not been using CGM.Marland Kitchen waiting on it to come it. Fasting blood sugars: 40-50 during spells.. FBS o usually 60  Has had to awake in middle of night to eats something.  He has been starting bedtime snack.. using pack of cookie/cracker.   He has been more active lately.. working in yard.  Going to gym 3 times a week. 2-hour postprandial: occ above 200  BP Readings from Last 3 Encounters:  01/23/23 104/80  11/30/22 110/80  10/19/22 100/64    Has lost 5 lbs. Wt Readings from Last 3 Encounters:  01/23/23 209 lb (94.8 kg)  11/30/22 211 lb 6 oz (95.9 kg)  10/19/22 214 lb 6 oz (97.2 kg)   Lab Results  Component Value Date   HGBA1C 7.6 (A) 11/30/2022         Relevant past medical, surgical, family and social history reviewed and updated as indicated. Interim medical history since our last visit reviewed. Allergies and medications reviewed and updated. Outpatient Medications Prior to Visit  Medication Sig Dispense Refill   B-D ULTRAFINE III SHORT PEN 31G X 8 MM MISC USE TO INJECT INSULIN DAILY 90 each 3    Blood Glucose Monitoring Suppl (ONE TOUCH ULTRA 2) w/Device KIT Use to check blood sugar up to 2 times a day 1 kit 0   calcium carbonate (OS-CAL) 600 MG TABS tablet Take 1,200 mg by mouth daily with breakfast.      Continuous Blood Gluc Sensor (DEXCOM G7 SENSOR) MISC by Does not apply route.     digoxin (LANOXIN) 0.125 MG tablet Take 1 tablet (125 mcg total) by mouth daily. 30 tablet 3   empagliflozin (JARDIANCE) 10 MG TABS tablet Take 1 tablet (10 mg total) by mouth daily before breakfast. 30 tablet 11   glucose blood (ONE TOUCH ULTRA TEST) test strip Check blood sugar up to 2 times a day and as instructed. Dx E11.9 100 each 5   Insulin Glargine (BASAGLAR KWIKPEN) 100 UNIT/ML Inject 20 Units into the skin daily.     Lancets (ONETOUCH ULTRASOFT) lancets Use to check blood sugar up to 2 times a day. 100 each 5   metFORMIN (GLUCOPHAGE-XR) 500 MG 24 hr tablet Take 1 tablet (500 mg total) by mouth 2 (two) times daily with a meal. 180 tablet 3   metoprolol succinate (TOPROL-XL) 100 MG 24 hr tablet Take 1 tablet (100 mg total) by mouth daily. Take with or immediately following a  meal. 90 tablet 1   Multiple Vitamins-Minerals (ADULT GUMMY PO) Take 2 tablets by mouth daily.     omeprazole (PRILOSEC) 40 MG capsule TAKE 1 CAPSULE DAILY AS    NEEDED FOR INDIGESTION 90 capsule 3   OVER THE COUNTER MEDICATION SEA MOSS SUPPLEMENT     sacubitril-valsartan (ENTRESTO) 97-103 MG Take 1 tablet by mouth 2 (two) times daily. 180 tablet 3   spironolactone (ALDACTONE) 25 MG tablet Take 1 tablet (25 mg total) by mouth daily. 90 tablet 3   No facility-administered medications prior to visit.     Per HPI unless specifically indicated in ROS section below Review of Systems  Constitutional:  Negative for fatigue and fever.  HENT:  Negative for ear pain.   Eyes:  Negative for pain.  Respiratory:  Negative for cough and shortness of breath.   Cardiovascular:  Negative for chest pain, palpitations and leg swelling.   Gastrointestinal:  Negative for abdominal pain.  Genitourinary:  Negative for dysuria.  Musculoskeletal:  Negative for arthralgias.  Neurological:  Positive for dizziness. Negative for syncope, light-headedness and headaches.  Psychiatric/Behavioral:  Negative for dysphoric mood.    Objective:  BP 104/80 (BP Location: Left Arm, Patient Position: Sitting, Cuff Size: Normal)   Pulse 78   Temp (!) 97.5 F (36.4 C) (Temporal)   Ht 5' 11.25" (1.81 m)   Wt 209 lb (94.8 kg)   SpO2 97%   BMI 28.95 kg/m   Wt Readings from Last 3 Encounters:  01/23/23 209 lb (94.8 kg)  11/30/22 211 lb 6 oz (95.9 kg)  10/19/22 214 lb 6 oz (97.2 kg)      Physical Exam Constitutional:      Appearance: He is well-developed.  HENT:     Head: Normocephalic.     Right Ear: Hearing normal.     Left Ear: Hearing normal.     Nose: Nose normal.  Neck:     Thyroid: No thyroid mass or thyromegaly.     Vascular: No carotid bruit.     Trachea: Trachea normal.  Cardiovascular:     Rate and Rhythm: Normal rate and regular rhythm.     Pulses: Normal pulses.     Heart sounds: Heart sounds not distant. No murmur heard.    No friction rub. No gallop.     Comments: No peripheral edema Pulmonary:     Effort: Pulmonary effort is normal. No respiratory distress.     Breath sounds: Normal breath sounds.  Skin:    General: Skin is warm and dry.     Findings: No rash.  Psychiatric:        Speech: Speech normal.        Behavior: Behavior normal.        Thought Content: Thought content normal.       Results for orders placed or performed in visit on 12/28/22  CUP PACEART REMOTE DEVICE CHECK  Result Value Ref Range   Date Time Interrogation Session [id number]    Pulse Generator Manufacturer MERM    Pulse Gen Model [id number] Claria MRI Quad CRT-D    Pulse Gen Serial Number [id number]    Clinic Name [facility]    Implantable Pulse Generator Type Cardiac Resynch Therapy Defibulator    Implantable Pulse  Generator Implant Date 62130865    Implantable Lead Manufacturer [facility]    Implantable Lead Model 4398 Attain Performa Straight MRI SureScan    Implantable Lead Serial Number [id number]    Implantable Lead Implant Date 78469629  Implantable Lead Location Detail 1 UNKNOWN    Implantable Lead Location [id number]    Implantable Lead Connection Status [id number]    Implantable Lead Manufacturer MERM    Implantable Lead Model 5076 CapSureFix Novus MRI SureScan    Implantable Lead Serial Number [id number]    Implantable Lead Implant Date 09811914    Implantable Lead Location Detail 1 APPENDAGE    Implantable Lead Location [id number]    Implantable Lead Connection Status [id number]    Implantable Lead Manufacturer [facility]    Implantable Lead Model [id number] Sprint Quattro Secure S MRI SureScan    Implantable Lead Serial Number [id number]    Implantable Lead Implant Date 62130865    Implantable Lead Location Detail 1 APEX    Implantable Lead Location [id number]    Implantable Lead Connection Status [id number]    Lead Channel Setting Sensing Sensitivity 0.3 mV   Lead Channel Setting Pacing Amplitude 2 V   Lead Channel Setting Pacing Pulse Width 0.4 ms   Lead Channel Setting Pacing Amplitude 1.5 V   Lead Channel Setting Pacing Pulse Width 0.4 ms   Lead Channel Setting Pacing Amplitude 1.5 V   Lead Channel Setting Pacing Capture Mode Fixed Pacing    Zone Setting Status Active    Zone Setting Status Inactive    Zone Setting Status Inactive    Zone Setting Status 755011    Zone Setting Status [phone]    Lead Channel Impedance Value 418 ohm   Lead Channel Sensing Intrinsic Amplitude 2 mV   Lead Channel Sensing Intrinsic Amplitude 2 mV   Lead Channel Pacing Threshold Amplitude 0.625 V   Lead Channel Pacing Threshold Pulse Width 0.4 ms   Lead Channel Impedance Value 342 ohm   Lead Channel Impedance Value 285 ohm   Lead Channel Sensing Intrinsic Amplitude 9.125 mV   Lead Channel Sensing Intrinsic Amplitude 9.125 mV   Lead  Channel Pacing Threshold Amplitude 0.625 V   Lead Channel Pacing Threshold Pulse Width 0.4 ms   HighPow Impedance 57 ohm   Lead Channel Impedance Value 779 ohm   Lead Channel Impedance Value 703 ohm   Lead Channel Impedance Value 608 ohm   Lead Channel Impedance Value 646 ohm   Lead Channel Impedance Value 722 ohm   Lead Channel Impedance Value 608 ohm   Lead Channel Impedance Value 399 ohm   Lead Channel Impedance Value 513 ohm   Lead Channel Impedance Value 418 ohm   Lead Channel Impedance Value 304 ohm   Lead Channel Impedance Value 224.438    Lead Channel Impedance Value 204.14 ohm   Lead Channel Impedance Value 172.541    Lead Channel Impedance Value 190.884    Lead Channel Impedance Value 176 ohm   Lead Channel Pacing Threshold Amplitude 1.25 V   Lead Channel Pacing Threshold Pulse Width 0.4 ms   Battery Status OK    Battery Remaining Longevity 20 mo   Battery Voltage 2.92 V   Brady Statistic RA Percent Paced 0.01 %   Brady Statistic RV Percent Paced 85.53 %   Brady Statistic AP VP Percent 0.01 %   Brady Statistic AS VP Percent 98.73 %   Brady Statistic AP VS Percent 0 %   Brady Statistic AS VS Percent 1.25 %    Assessment and Plan  Dizziness  Type 2 diabetes mellitus without complication, with long-term current use of insulin (HCC) Assessment & Plan:   Dizziness likely due to low blood sugars and possibly due to mild dehydration. Continue  healthy bedtime snack with protein and fiber.  Decrease Metfomrin XL back to 500 mg once daily.  Continue Balsalglar 18  Units daily.  Continue Jardiance at 10 mg daily.  Keep up with water intake.  Follow up as scheduled  6/14 for recheck of  A1C.     No follow-ups on file.   Kerby Nora, MD

## 2023-01-29 NOTE — Progress Notes (Signed)
Remote ICD transmission.   

## 2023-02-03 DIAGNOSIS — Z794 Long term (current) use of insulin: Secondary | ICD-10-CM | POA: Diagnosis not present

## 2023-02-03 DIAGNOSIS — E1165 Type 2 diabetes mellitus with hyperglycemia: Secondary | ICD-10-CM | POA: Diagnosis not present

## 2023-02-05 ENCOUNTER — Ambulatory Visit (INDEPENDENT_AMBULATORY_CARE_PROVIDER_SITE_OTHER): Payer: Medicare HMO | Admitting: Psychology

## 2023-02-05 DIAGNOSIS — F3289 Other specified depressive episodes: Secondary | ICD-10-CM
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
## 2023-02-21 ENCOUNTER — Ambulatory Visit (INDEPENDENT_AMBULATORY_CARE_PROVIDER_SITE_OTHER): Payer: Medicare HMO | Admitting: Psychology

## 2023-02-21 DIAGNOSIS — F3289 Other specified depressive episodes: Secondary | ICD-10-CM

## 2023-02-21 NOTE — Progress Notes (Signed)
[facility] Counselor/Therapist Progress Note  Patient ID: Philip Ponce, MRN: [id number]   Date: 02/21/23  Time Spent: 3:03  pm - 3:30 pm : 27 Minutes  Treatment Type: Individual Therapy.  Reported Symptoms: depression and anxiety.   Mental Status Exam: Appearance:  Casual     Behavior: Appropriate  Motor: Normal  Speech/Language:  Clear and Coherent  Affect: Appropriate  Mood: normal  Thought process: normal  Thought content:   WNL  Sensory/Perceptual disturbances:   WNL  Orientation: oriented to person, place, and time/date  Attention: Good  Concentration: Good  Memory: WNL  Fund of knowledge:  Good  Insight:   Good  Judgment:  Good  Impulse Control: Good   Risk Assessment: Danger to Self:  No Self-injurious Behavior: No Danger to Others: No Duty to Warn:no Physical Aggression / Violence:No  Access to Firearms a concern: No  Gang Involvement:No   Subjective:   Philip Ponce participated from car, via video and consented to treatment. Therapist participated from home office. We met online due to COVID pandemic. Philip Ponce reviewed the events of the past week. We reviewed numerous treatment approaches including CBT, BA, Problem Solving, and Solution focused therapy. Psych-education regarding the Equan's diagnosis of Other depression was provided during the session. We discussed Philip Ponce's goals treatment goals which include work on dynamics with wife and kids, communicate more effectively, set boundaries with family and self, engage in self-care,  developing sense of self, and managing symptoms. Philip Ponce provided verbal approval of the treatment plan.   Interventions: Psycho-education & Goal Setting.   Diagnosis:  Other depression  Psychiatric Treatment: No , none    Treatment Plan:  Client Abilities/Strengths Philip Ponce is self-aware and motivated for change.   Support System: Family and friends.   Client  Treatment Preferences Outpatient Therapy.   Client Statement of Needs Philip Ponce would like to work on dynamics with wife and kids, communicate more effectively, set boundaries with family and self, engage in self-care,  developing sense of self, and managing symptoms.   Treatment Level Weekly  Symptoms  Anxiety: difficulty managing worry, trouble relaxing, irritability   (Status: maintained) Depression: poor sleep, lethargy, fluctuating appetite, and difficulty concentrating.    (Status: maintained)  Goals:   Philip Ponce experiences symptoms of depression and anxiety.   Treatment plan signed and available on s-drive:  No    Target Date: 02/21/24 Frequency: Weekly  Progress: 0 Modality: individual    Therapist will provide referrals for additional resources as appropriate.  Therapist will provide psycho-education regarding Philip Ponce's diagnosis and corresponding treatment approaches and interventions. Licensed Clinical Social Worker, [place], LCSW will support the patient's ability to achieve the goals identified. will employ CBT, BA, Problem-solving, Solution Focused, Mindfulness,  coping skills, & other evidenced-based practices will be used to promote progress towards healthy functioning to help manage decrease symptoms associated with his diagnosis.   Reduce overall level, frequency, and intensity of the feelings of depression, anxiety evidenced by decreased overall symptoms from 6 to 7 days/week to 0 to 1 days/week per client report for at least 3 consecutive months. Verbally express understanding of the relationship between feelings of depression, anxiety and their impact on thinking patterns and behaviors. Verbalize an understanding of the role that distorted thinking plays in creating fears, excessive worry, and ruminations.    Philip Ponce participated in the creation of the treatment plan)    Philip Ovens, LCSW

## 2023-03-04 DIAGNOSIS — E7800 Pure hypercholesterolemia, unspecified: Secondary | ICD-10-CM | POA: Diagnosis not present

## 2023-03-04 DIAGNOSIS — I10 Essential (primary) hypertension: Secondary | ICD-10-CM | POA: Diagnosis not present

## 2023-03-04 DIAGNOSIS — I5020 Unspecified systolic (congestive) heart failure: Secondary | ICD-10-CM | POA: Diagnosis not present

## 2023-03-04 DIAGNOSIS — G4733 Obstructive sleep apnea (adult) (pediatric): Secondary | ICD-10-CM | POA: Diagnosis not present

## 2023-03-04 DIAGNOSIS — Z794 Long term (current) use of insulin: Secondary | ICD-10-CM | POA: Diagnosis not present

## 2023-03-04 DIAGNOSIS — Z95810 Presence of automatic (implantable) cardiac defibrillator: Secondary | ICD-10-CM | POA: Diagnosis not present

## 2023-03-04 DIAGNOSIS — E119 Type 2 diabetes mellitus without complications: Secondary | ICD-10-CM | POA: Diagnosis not present

## 2023-03-04 DIAGNOSIS — I428 Other cardiomyopathies: Secondary | ICD-10-CM | POA: Diagnosis not present

## 2023-03-05 ENCOUNTER — Ambulatory Visit: Payer: Medicare HMO | Admitting: Psychology

## 2023-03-05 DIAGNOSIS — F3289 Other specified depressive episodes: Secondary | ICD-10-CM | POA: Diagnosis not present

## 2023-03-05 NOTE — Progress Notes (Signed)
[facility] Counselor/Therapist Progress Note  Patient ID: Philip Ponce, MRN: [id number]   Date: 03/05/23  Time Spent: 12:04  pm - 1:01  pm : 57 Minutes  Treatment Type: Individual Therapy.  Reported Symptoms: depression and anxiety.   Mental Status Exam: Appearance:  Casual     Behavior: Appropriate  Motor: Normal  Speech/Language:  Clear and Coherent  Affect: Appropriate  Mood: normal  Thought process: normal  Thought content:   WNL  Sensory/Perceptual disturbances:   WNL  Orientation: oriented to person, place, and time/date  Attention: Good  Concentration: Good  Memory: WNL  Fund of knowledge:  Good  Insight:   Good  Judgment:  Good  Impulse Control: Good   Risk Assessment: Danger to Self:  No Self-injurious Behavior: No Danger to Others: No Duty to Warn:no Physical Aggression / Violence:No  Access to Firearms a concern: No  Gang Involvement:No   Subjective:   Philip Ponce participated from the office with the Therapist. We met online due to COVID pandemic. Philip Ponce reviewed the events of the past week.He noted frustration regarding his wife's unilateral decisions in relation to parenting, a lack of communication, and lack of consistency between them. He noted a lack of financial investment from his wife, which also results in frustration. He noted often feeling like "not in the loop" and discussed feeling often unappreciated. He noted a need for consistent communication and planning, joint decision-making, and financial contribution. He noted feelings of frustration. He discussed his attempts to address concerns and his wife's general avoidance to have difficult discussions. We worked on identifying ways to identify concerns, discuss concerns positively, and set boundaries. Therapist modeled this during the session. Therapist encouraged couple's counseling and offered to facilitate a referral, if requested. Therapist validated and normalized  Philip Ponce's feelings and experience and provided supportive therapy.   Interventions: CBT and interpersonal.   Diagnosis:  Other depression  Psychiatric Treatment: No , none    Treatment Plan:  Client Abilities/Strengths Philip Ponce is self-aware and motivated for change.   Support System: Family and friends.   Client Treatment Preferences Outpatient Therapy.   Client Statement of Needs Philip Ponce would like to work on dynamics with wife and kids, communicate more effectively, set boundaries with family and self, engage in self-care,  developing sense of self, and managing symptoms.   Treatment Level Weekly  Symptoms  Anxiety: difficulty managing worry, trouble relaxing, irritability   (Status: maintained) Depression: poor sleep, lethargy, fluctuating appetite, and difficulty concentrating.    (Status: maintained)  Goals:   Philip Ponce experiences symptoms of depression and anxiety.   Treatment plan signed and available on s-drive:  No    Target Date: 02/21/24 Frequency: Weekly  Progress: 0 Modality: individual    Therapist will provide referrals for additional resources as appropriate.  Therapist will provide psycho-education regarding Philip Ponce's diagnosis and corresponding treatment approaches and interventions. Licensed Clinical Social Worker, [place], LCSW will support the patient's ability to achieve the goals identified. will employ CBT, BA, Problem-solving, Solution Focused, Mindfulness,  coping skills, & other evidenced-based practices will be used to promote progress towards healthy functioning to help manage decrease symptoms associated with his diagnosis.   Reduce overall level, frequency, and intensity of the feelings of depression, anxiety evidenced by decreased overall symptoms from 6 to 7 days/week to 0 to 1 days/week per client report for at least 3 consecutive months. Verbally express understanding of the relationship between feelings of depression, anxiety and  their impact on thinking patterns  and behaviors. Verbalize an understanding of the role that distorted thinking plays in creating fears, excessive worry, and ruminations.    Philip Ponce participated in the creation of the treatment plan)    Delight Ovens, LCSW

## 2023-03-06 ENCOUNTER — Encounter: Payer: Self-pay | Admitting: Pharmacist

## 2023-03-06 NOTE — Progress Notes (Signed)
[facility]) [facility] Quality Pharmacy Team Statin Quality Measure Assessment  03/06/2023  Philip Ponce Apr 26, 1971 [id number]  Per review of chart and payor information, patient has a diagnosis of diabetes and cardiovascular disease but is not currently filling a statin prescription.  This places patient into the Statin Use In Patients with Diabetes (SUPD) and Statin Use in Patients with Cardiovascular Disease (SPC) measures for CMS.    Documentation in chart that patient is statin intolerant due to muscle pain.      Component Value Date/Time   CHOL 127 08/03/2022 0752   TRIG 71.0 08/03/2022 0752   HDL 65.70 08/03/2022 0752   CHOLHDL 2 08/03/2022 0752   VLDL 14.2 08/03/2022 0752   LDLCALC 47 08/03/2022 0752    Please consider ONE of the following recommendations:  Initiate high intensity statin Atorvastatin 40 mg once daily, #90, 3 refills   Rosuvastatin 20 mg once daily, #90, 3 refills    Initiate moderate intensity  statin with reduced frequency if prior  statin intolerance 1x weekly, #13, 3 refills   2x weekly, #26, 3 refills   3x weekly, #39, 3 refills    Code for past statin intolerance  (required annually)  Provider Requirements: Must associate code during an office visit or telehealth encounter   Drug Induced Myopathy G72.0   Myositis, unspecified M60.9   Myopathy, unspecified G72.9   Rhabdomyolysis  M62.82   Myalgia (SPC ONLY) M79.1   Alcoholic cirrhosis of liver without ascites K70.30   Alcoholic cirrhosis of liver with ascites K70.31   Unspecified cirrhosis of liver K74.60   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   Reynold Bowen, PharmD [facility]  [facility] Clinical Pharmacist Direct Dial: [phone]

## 2023-03-08 ENCOUNTER — Encounter: Payer: Self-pay | Admitting: Family Medicine

## 2023-03-08 ENCOUNTER — Ambulatory Visit: Payer: Medicare HMO | Admitting: Family Medicine

## 2023-03-08 ENCOUNTER — Ambulatory Visit (INDEPENDENT_AMBULATORY_CARE_PROVIDER_SITE_OTHER): Payer: Medicare HMO | Admitting: Family Medicine

## 2023-03-08 VITALS — BP 120/72 | HR 71 | Temp 97.3°F | Ht 71.25 in | Wt 208.1 lb

## 2023-03-08 DIAGNOSIS — E785 Hyperlipidemia, unspecified: Secondary | ICD-10-CM

## 2023-03-08 DIAGNOSIS — G720 Drug-induced myopathy: Secondary | ICD-10-CM | POA: Insufficient documentation

## 2023-03-08 DIAGNOSIS — E1169 Type 2 diabetes mellitus with other specified complication: Secondary | ICD-10-CM

## 2023-03-08 DIAGNOSIS — Z7984 Long term (current) use of oral hypoglycemic drugs: Secondary | ICD-10-CM

## 2023-03-08 DIAGNOSIS — E119 Type 2 diabetes mellitus without complications: Secondary | ICD-10-CM | POA: Diagnosis not present

## 2023-03-08 DIAGNOSIS — T380X5A Adverse effect of glucocorticoids and synthetic analogues, initial encounter: Secondary | ICD-10-CM | POA: Diagnosis not present

## 2023-03-08 DIAGNOSIS — Z794 Long term (current) use of insulin: Secondary | ICD-10-CM | POA: Diagnosis not present

## 2023-03-08 LAB — POCT GLYCOSYLATED HEMOGLOBIN (HGB A1C): Hemoglobin A1C: 6.9 % — AB (ref 4.0–5.6)

## 2023-03-08 MED ORDER — METOPROLOL SUCCINATE ER 100 MG PO TB24: 100.0000 mg | ORAL_TABLET | Freq: Every day | ORAL | 0 refills | Status: DC

## 2023-03-08 MED ORDER — METOPROLOL SUCCINATE ER 100 MG PO TB24: 100.0000 mg | ORAL_TABLET | Freq: Every day | ORAL | 1 refills | Status: DC

## 2023-03-08 MED ORDER — EMPAGLIFLOZIN 10 MG PO TABS: 10.0000 mg | ORAL_TABLET | Freq: Every day | ORAL | 3 refills | Status: DC

## 2023-03-08 NOTE — Assessment & Plan Note (Addendum)
LDL at goal  in past  on no medication.. myalgia associated with statin

## 2023-03-08 NOTE — Assessment & Plan Note (Addendum)
Chronic, continued improvement,  now at goal on current regimen.  On Basaglar 20 units daily.. has decreased to 18 Metformin XL 500 mg daily Continued jardiance 10 mg daily  Reevaluate in 6 months.

## 2023-03-08 NOTE — Assessment & Plan Note (Signed)
myalgia associated with statin  trials in past.

## 2023-03-08 NOTE — Progress Notes (Signed)
Patient ID: Philip Ponce, male    DOB: 1971/08/06, 52 y.o.   MRN: [id number]  This visit was conducted in person.  BP 120/72 (BP Location: Right Arm, Patient Position: Sitting, Cuff Size: Large)   Pulse 71   Temp (!) 97.3 F (36.3 C) (Temporal)   Ht 5' 11.25" (1.81 m)   Wt 208 lb 2 oz (94.4 kg)   SpO2 100%   BMI 28.82 kg/m    CC:  Chief Complaint  Patient presents with   Diabetes    Subjective:   HPI: Philip Ponce is a 52 y.o. male presenting on 03/08/2023 for Diabetes  At last dizziness off and on since starting Jardiance... Started  11/2022 given diabetes and congestive heart failure history.  No CP, no SOB.  On Basaglar 20 units daily.. has decreased to 18  Was on Metformin XL 500 mg twice daily... no GI upset.... at last OV decreased to daily. Continued jardiance 10 mg daily    He is having less dizziness.. tolerating better.    He has been more active lately.. working in yard.  Going to gym 3 times a week.   He is now using CGM. No lows given lower dose of metformin. FBS: 115 2-hour postprandial:  169  BP Readings from Last 3 Encounters:  03/08/23 120/72  01/23/23 104/80  11/30/22 110/80    Wt Readings from Last 3 Encounters:  03/08/23 208 lb 2 oz (94.4 kg)  01/23/23 209 lb (94.8 kg)  11/30/22 211 lb 6 oz (95.9 kg)   Lab Results  Component Value Date   HGBA1C 6.9 (A) 03/08/2023         Relevant past medical, surgical, family and social history reviewed and updated as indicated. Interim medical history since our last visit reviewed. Allergies and medications reviewed and updated. Outpatient Medications Prior to Visit  Medication Sig Dispense Refill   B-D ULTRAFINE III SHORT PEN 31G X 8 MM MISC USE TO INJECT INSULIN DAILY 90 each 3   Blood Glucose Monitoring Suppl (ONE TOUCH ULTRA 2) w/Device KIT Use to check blood sugar up to 2 times a day 1 kit 0   calcium carbonate (OS-CAL) 600 MG TABS tablet Take 1,200 mg by mouth daily with  breakfast.      Continuous Blood Gluc Sensor (DEXCOM G7 SENSOR) MISC by Does not apply route.     digoxin (LANOXIN) 0.125 MG tablet Take 1 tablet (125 mcg total) by mouth daily. 30 tablet 3   glucose blood (ONE TOUCH ULTRA TEST) test strip Check blood sugar up to 2 times a day and as instructed. Dx E11.9 100 each 5   Insulin Glargine (BASAGLAR KWIKPEN) 100 UNIT/ML Inject 20 Units into the skin daily.     Lancets (ONETOUCH ULTRASOFT) lancets Use to check blood sugar up to 2 times a day. 100 each 5   metFORMIN (GLUCOPHAGE-XR) 500 MG 24 hr tablet Take 1 tablet (500 mg total) by mouth 2 (two) times daily with a meal. 180 tablet 3   Multiple Vitamins-Minerals (ADULT GUMMY PO) Take 2 tablets by mouth daily.     omeprazole (PRILOSEC) 40 MG capsule TAKE 1 CAPSULE DAILY AS    NEEDED FOR INDIGESTION 90 capsule 3   OVER THE COUNTER MEDICATION SEA MOSS SUPPLEMENT     sacubitril-valsartan (ENTRESTO) 97-103 MG Take 1 tablet by mouth 2 (two) times daily. 180 tablet 3   spironolactone (ALDACTONE) 25 MG tablet Take 1 tablet (25 mg total) by mouth daily.  90 tablet 3   empagliflozin (JARDIANCE) 10 MG TABS tablet Take 1 tablet (10 mg total) by mouth daily before breakfast. 30 tablet 11   metoprolol succinate (TOPROL-XL) 100 MG 24 hr tablet Take 1 tablet (100 mg total) by mouth daily. Take with or immediately following a meal. 90 tablet 1   No facility-administered medications prior to visit.     Per HPI unless specifically indicated in ROS section below Review of Systems  Constitutional:  Negative for fatigue and fever.  HENT:  Negative for ear pain.   Eyes:  Negative for pain.  Respiratory:  Negative for cough and shortness of breath.   Cardiovascular:  Negative for chest pain, palpitations and leg swelling.  Gastrointestinal:  Negative for abdominal pain.  Genitourinary:  Negative for dysuria.  Musculoskeletal:  Negative for arthralgias.  Neurological:  Positive for dizziness. Negative for syncope,  light-headedness and headaches.  Psychiatric/Behavioral:  Negative for dysphoric mood.    Objective:  BP 120/72 (BP Location: Right Arm, Patient Position: Sitting, Cuff Size: Large)   Pulse 71   Temp (!) 97.3 F (36.3 C) (Temporal)   Ht 5' 11.25" (1.81 m)   Wt 208 lb 2 oz (94.4 kg)   SpO2 100%   BMI 28.82 kg/m   Wt Readings from Last 3 Encounters:  03/08/23 208 lb 2 oz (94.4 kg)  01/23/23 209 lb (94.8 kg)  11/30/22 211 lb 6 oz (95.9 kg)      Physical Exam Constitutional:      Appearance: He is well-developed.  HENT:     Head: Normocephalic.     Right Ear: Hearing normal.     Left Ear: Hearing normal.     Nose: Nose normal.  Neck:     Thyroid: No thyroid mass or thyromegaly.     Vascular: No carotid bruit.     Trachea: Trachea normal.  Cardiovascular:     Rate and Rhythm: Normal rate and regular rhythm.     Pulses: Normal pulses.     Heart sounds: Heart sounds not distant. No murmur heard.    No friction rub. No gallop.     Comments: No peripheral edema Pulmonary:     Effort: Pulmonary effort is normal. No respiratory distress.     Breath sounds: Normal breath sounds.  Skin:    General: Skin is warm and dry.     Findings: No rash.  Psychiatric:        Speech: Speech normal.        Behavior: Behavior normal.        Thought Content: Thought content normal.       Results for orders placed or performed in visit on 03/08/23  POCT glycosylated hemoglobin (Hb A1C)  Result Value Ref Range   Hemoglobin A1C 6.9 (A) 4.0 - 5.6 %   HbA1c POC (<> result, manual entry)     HbA1c, POC (prediabetic range)     HbA1c, POC (controlled diabetic range)      Assessment and Plan  Type 2 diabetes mellitus without complication, with long-term current use of insulin (HCC) Assessment & Plan:  Chronic, continued improvement,  now at goal on current regimen.  On Basaglar 20 units daily.. has decreased to 18 Metformin XL 500 mg daily Continued jardiance 10 mg daily  Reevaluate in  6 months.  Orders: -     POCT glycosylated hemoglobin (Hb A1C)  Steroid-induced myopathy Assessment & Plan: myalgia associated with statin  trials in past.     Hyperlipidemia  associated with type 2 diabetes mellitus (HCC) Assessment & Plan: LDL at goal  in past  on no medication.. myalgia associated with statin     Other orders -     Metoprolol Succinate ER; Take 1 tablet (100 mg total) by mouth daily. Take with or immediately following a meal.  Dispense: 30 tablet; Refill: 0 -     Empagliflozin; Take 1 tablet (10 mg total) by mouth daily before breakfast.  Dispense: 90 tablet; Refill: 3    Return in about 6 months (around 09/07/2023) for phone AMW,  fasting labs then CPE with me.   Kerby Nora, MD

## 2023-03-29 ENCOUNTER — Ambulatory Visit (INDEPENDENT_AMBULATORY_CARE_PROVIDER_SITE_OTHER): Payer: Medicare HMO

## 2023-03-29 DIAGNOSIS — I428 Other cardiomyopathies: Secondary | ICD-10-CM

## 2023-03-30 LAB — CUP PACEART REMOTE DEVICE CHECK
Battery Remaining Longevity: 21 mo
Battery Voltage: 2.91 V
Brady Statistic AP VP Percent: 0.01 %
Brady Statistic AP VS Percent: 0 %
Brady Statistic AS VP Percent: 98.71 %
Brady Statistic AS VS Percent: 1.28 %
Brady Statistic RA Percent Paced: 0.01 %
Brady Statistic RV Percent Paced: 82.09 %
Date Time Interrogation Session: 20240706013629
HighPow Impedance: 58 Ohm
Implantable Lead Connection Status: 753985
Implantable Lead Connection Status: 753985
Implantable Lead Connection Status: 753985
Implantable Lead Implant Date: 20181003
Implantable Lead Implant Date: 20181003
Implantable Lead Implant Date: 20181003
Implantable Lead Location: 753858
Implantable Lead Location: 753859
Implantable Lead Location: 753860
Implantable Lead Model: 4398
Implantable Lead Model: 5076
Implantable Pulse Generator Implant Date: 20181003
Lead Channel Impedance Value: 195.429
Lead Channel Impedance Value: 205.2 Ohm
Lead Channel Impedance Value: 216.367
Lead Channel Impedance Value: 241.412
Lead Channel Impedance Value: 257.018
Lead Channel Impedance Value: 304 Ohm
Lead Channel Impedance Value: 342 Ohm
Lead Channel Impedance Value: 342 Ohm
Lead Channel Impedance Value: 418 Ohm
Lead Channel Impedance Value: 456 Ohm
Lead Channel Impedance Value: 513 Ohm
Lead Channel Impedance Value: 589 Ohm
Lead Channel Impedance Value: 665 Ohm
Lead Channel Impedance Value: 722 Ohm
Lead Channel Impedance Value: 760 Ohm
Lead Channel Impedance Value: 779 Ohm
Lead Channel Impedance Value: 836 Ohm
Lead Channel Impedance Value: 874 Ohm
Lead Channel Pacing Threshold Amplitude: 0.625 V
Lead Channel Pacing Threshold Amplitude: 0.625 V
Lead Channel Pacing Threshold Amplitude: 1.25 V
Lead Channel Pacing Threshold Pulse Width: 0.4 ms
Lead Channel Pacing Threshold Pulse Width: 0.4 ms
Lead Channel Pacing Threshold Pulse Width: 0.4 ms
Lead Channel Sensing Intrinsic Amplitude: 2 mV
Lead Channel Sensing Intrinsic Amplitude: 2 mV
Lead Channel Sensing Intrinsic Amplitude: 9.125 mV
Lead Channel Sensing Intrinsic Amplitude: 9.125 mV
Lead Channel Setting Pacing Amplitude: 1.5 V
Lead Channel Setting Pacing Amplitude: 1.5 V
Lead Channel Setting Pacing Amplitude: 2 V
Lead Channel Setting Pacing Pulse Width: 0.4 ms
Lead Channel Setting Pacing Pulse Width: 0.4 ms
Lead Channel Setting Sensing Sensitivity: 0.3 mV
Zone Setting Status: 755011
Zone Setting Status: 755011

## 2023-04-02 ENCOUNTER — Ambulatory Visit: Payer: Medicare HMO | Admitting: Psychology

## 2023-04-16 NOTE — Progress Notes (Signed)
Remote ICD transmission.   

## 2023-05-17 NOTE — Progress Notes (Unsigned)
Referring Physician:  Excell Seltzer, MD [address] [place],  [place] [address]  Primary Physician:  Excell Seltzer, MD  History of Present Illness: 05/20/2023 [name] has a history of DM, steroid-induced myopathy, hyperlipidemia, cardiomyopathy, HTN, GERD, hypercholesterolemia, and history of gastric bypass.   He had C5-C7 arthroplasty by Dr. Myer Haff on 07/24/17. Was doing well at his last visit on 08/22/17 and his triceps weakness on left was improving. He was lost to follow up.   He has a pacemaker.   2-3 month history of intermittent neck pain  with no arm pain. He has intermittent numbness and tingling in both arms that is worse with prolonged driving. No weakness. Pain is worse at night, feels like a bad cramp.   Care with NSAIDs due to h/o gastric bypass surgery.   Bowel/Bladder Dysfunction: none  Conservative measures:  Physical therapy: did prior to surgery in 2018  Multimodal medical therapy including regular antiinflammatories: none  Injections:  Did one cervical ESI prior to surgery in 2018 Past Surgery:  C5-C7 arthroplasty by Dr. Myer Haff on 07/24/17  Fransico Setters has no symptoms of cervical myelopathy.  The symptoms are causing a significant impact on the patient's life.   Review of Systems:  A 10 point review of systems is negative, except for the pertinent positives and negatives detailed in the HPI.  Past Medical History: Past Medical History:  Diagnosis Date   Acute blood loss anemia 11/20/2016   AICD (automatic cardioverter/defibrillator) present    a. 06/2017 s/p MDT DTMA 1QQ [facility] MRI Quad CRT-D SureScan (ser # [id number]).   Chronic combined systolic (congestive) and diastolic (congestive) heart failure (HCC)    a. 11/2016 Echo: EF 20-25%, glob HK, antsept, ant, apical HK, Gr1 DD, mild MR, mildly dil LA, nl RV fxn; b. 05/2017 Echo: Ef 20-25%, Gr2 DD, prominent apical trabeculations; c. 12/2017 Echo: EF 30-35%, Gr1 DD,  glob HK.   COVID-19 03/08/2021   Diabetes mellitus (HCC)    Essential hypertension    Family history of adverse reaction to anesthesia    mom had a hard time waking up   GERD (gastroesophageal reflux disease)    NICM (nonischemic cardiomyopathy) (HCC)    a. 11/2016 Cath: nl cors, EF 25-30%; b. 11/2016 Echo: EF 20-25%; c. 05/2017 Echo: EF 20-25%; d. 12/2017 Echo: EF 30-35%, Gr1 DD.   Slipped intervertebral disc    L4 L5   STEMI (ST elevation myocardial infarction) (HCC)    a. 11/2016 ST elevation -->Nl cors on cath.    Past Surgical History: Past Surgical History:  Procedure Laterality Date   BIV ICD INSERTION CRT-D  06/26/2017   BIV ICD INSERTION CRT-D N/A 06/26/2017   Procedure: BIV ICD INSERTION CRT-D;  Surgeon: Regan Lemming, MD;  Location: [facility] INVASIVE CV LAB;  Service: Cardiovascular;  Laterality: N/A;   CERVICAL DISC ARTHROPLASTY N/A 07/24/2017   Procedure: CERVICAL ANTERIOR DISC ARTHROPLASTY C5-C7;  Surgeon: Venetia Night, MD;  Location: [facility] ORS;  Service: Neurosurgery;  Laterality: N/A;   COLONOSCOPY WITH PROPOFOL N/A 12/03/2016   Procedure: COLONOSCOPY WITH PROPOFOL;  Surgeon: Wyline Mood, MD;  Location: [facility] ENDOSCOPY;  Service: Endoscopy;  Laterality: N/A;   COLONOSCOPY WITH PROPOFOL N/A 09/25/2022   Procedure: COLONOSCOPY WITH PROPOFOL;  Surgeon: Midge Minium, MD;  Location: [facility] ENDOSCOPY;  Service: Endoscopy;  Laterality: N/A;   ESOPHAGOGASTRODUODENOSCOPY (EGD) WITH PROPOFOL N/A 12/03/2016   Procedure: ESOPHAGOGASTRODUODENOSCOPY (EGD) WITH PROPOFOL;  Surgeon: Wyline Mood, MD;  Location: [facility] ENDOSCOPY;  Service: Endoscopy;  Laterality: N/A;   FINGER SURGERY Right 2012   index finger   GASTRIC BYPASS     GIVENS CAPSULE STUDY N/A 01/07/2017   Procedure: GIVENS CAPSULE STUDY;  Surgeon: Wyline Mood, MD;  Location: [facility] ENDOSCOPY;  Service: Endoscopy;  Laterality: N/A;   LEFT HEART CATH AND CORONARY ANGIOGRAPHY N/A 12/04/2016   Procedure: Left Heart Cath and Coronary  Angiography;  Surgeon: Yvonne Kendall, MD;  Location: [facility] INVASIVE CV LAB;  Service: Cardiovascular;  Laterality: N/A;   SHOULDER ARTHROSCOPY WITH OPEN ROTATOR CUFF REPAIR Left 10/04/2017   Procedure: SHOULDER ARTHROSCOPY WITH ROTATOR CUFF REPAIR, SUBACROMIAL DECOMPRESSION,OPEN BICEP TENODESIS, EXTENSIVE DEBRIDEMENT;  Surgeon: Signa Kell, MD;  Location: [facility] ORS;  Service: Orthopedics;  Laterality: Left;    Allergies: Allergies as of 05/20/2023 - Review Complete 05/20/2023  Allergen Reaction Noted   Mobic [meloxicam] Other (See Comments) 11/23/2016   Tramadol  10/09/2017   Diclofenac Palpitations and Other (See Comments) 11/20/2016    Medications: Outpatient Encounter Medications as of 05/20/2023  Medication Sig   B-D ULTRAFINE III SHORT PEN 31G X 8 MM MISC USE TO INJECT INSULIN DAILY   Blood Glucose Monitoring Suppl (ONE TOUCH ULTRA 2) w/Device KIT Use to check blood sugar up to 2 times a day   calcium carbonate (OS-CAL) 600 MG TABS tablet Take 1,200 mg by mouth daily with breakfast.    Continuous Blood Gluc Sensor (DEXCOM G7 SENSOR) MISC by Does not apply route.   digoxin (LANOXIN) 0.125 MG tablet Take 1 tablet (125 mcg total) by mouth daily.   empagliflozin (JARDIANCE) 10 MG TABS tablet Take 1 tablet (10 mg total) by mouth daily before breakfast.   glucose blood (ONE TOUCH ULTRA TEST) test strip Check blood sugar up to 2 times a day and as instructed. Dx E11.9   Insulin Glargine (BASAGLAR KWIKPEN) 100 UNIT/ML Inject 20 Units into the skin daily.   Lancets (ONETOUCH ULTRASOFT) lancets Use to check blood sugar up to 2 times a day.   metFORMIN (GLUCOPHAGE-XR) 500 MG 24 hr tablet Take 1 tablet (500 mg total) by mouth 2 (two) times daily with a meal.   metoprolol succinate (TOPROL-XL) 100 MG 24 hr tablet Take 1 tablet (100 mg total) by mouth daily. Take with or immediately following a meal.   Multiple Vitamins-Minerals (ADULT GUMMY PO) Take 2 tablets by mouth daily.   omeprazole  (PRILOSEC) 40 MG capsule TAKE 1 CAPSULE DAILY AS    NEEDED FOR INDIGESTION   OVER THE COUNTER MEDICATION SEA MOSS SUPPLEMENT   sacubitril-valsartan (ENTRESTO) 97-103 MG Take 1 tablet by mouth 2 (two) times daily.   spironolactone (ALDACTONE) 25 MG tablet Take 1 tablet (25 mg total) by mouth daily.   No facility-administered encounter medications on file as of 05/20/2023.    Social History: Social History   Tobacco Use   Smoking status: Never   Smokeless tobacco: Never  Vaping Use   Vaping status: Never Used  Substance Use Topics   Alcohol use: Not Currently   Drug use: No    Family Medical History: Family History  Problem Relation Age of Onset   Diabetes Mother    Hypertension Mother    Cancer Mother        breast   Diabetes Father    Hypertension Father    Cancer Father        colon   Cancer Maternal Grandfather        prostate   Stroke Maternal Grandfather  CVA    Physical Examination: Vitals:   05/20/23 1001  BP: 114/72    General: Patient is well developed, well nourished, calm, collected, and in no apparent distress. Attention to examination is appropriate.  Respiratory: Patient is breathing without any difficulty.   NEUROLOGICAL:     Awake, alert, oriented to person, place, and time.  Speech is clear and fluent. Fund of knowledge is appropriate.   Cranial Nerves: Pupils equal round and reactive to light.  Facial tone is symmetric.    Well healed cervical incision.  No posterior cervical tenderness. No tenderness in bilateral trapezial region.   No abnormal lesions on exposed skin.   Strength: Side Biceps Triceps Deltoid Interossei Grip Wrist Ext. Wrist Flex.  R 5 5 5 5 5 5 5   L 5 5 5 5 5 5 5    Side Iliopsoas Quads Hamstring PF DF EHL  R 5 5 5 5 5 5   L 5 5 5 5 5 5    Reflexes are 2+ and symmetric at the biceps, brachioradialis, patella and achilles.   Hoffman's is absent.  Clonus is not present.   Bilateral upper and lower extremity  sensation is intact to light touch.     Gait is normal.     Medical Decision Making  Imaging: No recent cervical imaging.    Assessment and Plan: Mr. Pedicini is a pleasant 52 y.o. male has 2-3 month history of intermittent neck pain  with no arm pain. He has intermittent numbness and tingling in both arms that is worse with prolonged driving. No weakness. Pain is worse at night, feels like a bad cramp.  C5-C7 arthroplasty by Dr. Myer Haff on 07/24/17.   No cervical imaging done.   Treatment options discussed with patient and following plan made:   - Xrays of cervical spine ordered.  - Will message him in MyChart with xray results.  - Depending on xray results, may consider PT for cervical spine.  - May consider EMG at some point for numbness/tingling in hands if no improvement.  - Will schedule f/u after I review his xrays.   I spent a total of 25 minutes in face-to-face and non-face-to-face activities related to this patient's care today including review of outside records, review of imaging, review of symptoms, physical exam, discussion of differential diagnosis, discussion of treatment options, and documentation.   Thank you for involving me in the care of this patient.   Drake Leach PA-C Dept. of Neurosurgery

## 2023-05-20 ENCOUNTER — Encounter: Payer: Self-pay | Admitting: Orthopedic Surgery

## 2023-05-20 ENCOUNTER — Ambulatory Visit
Admission: RE | Admit: 2023-05-20 | Discharge: 2023-05-20 | Disposition: A | Discharge status: Home / Self Care | Payer: Medicare HMO | Source: Ambulatory Visit | Attending: Orthopedic Surgery | Admitting: Orthopedic Surgery

## 2023-05-20 ENCOUNTER — Inpatient Hospital Stay: Admit: 2023-05-20 | Payer: Medicare HMO

## 2023-05-20 ENCOUNTER — Ambulatory Visit: Payer: Medicare HMO | Admitting: Orthopedic Surgery

## 2023-05-20 VITALS — BP 114/72 | Ht 71.0 in | Wt 211.2 lb

## 2023-05-20 DIAGNOSIS — Z9889 Other specified postprocedural states: Secondary | ICD-10-CM

## 2023-05-20 DIAGNOSIS — M542 Cervicalgia: Secondary | ICD-10-CM | POA: Diagnosis not present

## 2023-05-29 ENCOUNTER — Encounter: Payer: Self-pay | Admitting: Orthopedic Surgery

## 2023-05-29 NOTE — Telephone Encounter (Signed)
Cervical xrays dated 05/20/23:  IMPRESSION: Postsurgical changes.  No acute osseous abnormalities.     Electronically Signed   By: Layla Maw M.D.   On: 05/25/2023 15:02  I have personally reviewed the images and agree with the above interpretation.   Above xrays reviewed with Dr. Myer Haff and they look good. He recommends PT. Patient sent a message.

## 2023-07-01 ENCOUNTER — Ambulatory Visit (INDEPENDENT_AMBULATORY_CARE_PROVIDER_SITE_OTHER): Payer: Medicare HMO

## 2023-07-01 DIAGNOSIS — I428 Other cardiomyopathies: Secondary | ICD-10-CM

## 2023-07-01 DIAGNOSIS — I5022 Chronic systolic (congestive) heart failure: Secondary | ICD-10-CM

## 2023-07-01 LAB — CUP PACEART REMOTE DEVICE CHECK
Battery Remaining Longevity: 18 mo
Battery Voltage: 2.9 V
Brady Statistic AP VP Percent: 0.03 %
Brady Statistic AP VS Percent: 0 %
Brady Statistic AS VP Percent: 98.66 %
Brady Statistic AS VS Percent: 1.32 %
Brady Statistic RA Percent Paced: 0.03 %
Brady Statistic RV Percent Paced: 82.76 %
Date Time Interrogation Session: 20241007022723
HighPow Impedance: 58 Ohm
Implantable Lead Connection Status: 753985
Implantable Lead Connection Status: 753985
Implantable Lead Connection Status: 753985
Implantable Lead Implant Date: 20181003
Implantable Lead Implant Date: 20181003
Implantable Lead Implant Date: 20181003
Implantable Lead Location: 753858
Implantable Lead Location: 753859
Implantable Lead Location: 753860
Implantable Lead Model: 4398
Implantable Lead Model: 5076
Implantable Pulse Generator Implant Date: 20181003
Lead Channel Impedance Value: 195.429
Lead Channel Impedance Value: 205.2 Ohm
Lead Channel Impedance Value: 216.367
Lead Channel Impedance Value: 241.412
Lead Channel Impedance Value: 257.018
Lead Channel Impedance Value: 285 Ohm
Lead Channel Impedance Value: 342 Ohm
Lead Channel Impedance Value: 342 Ohm
Lead Channel Impedance Value: 456 Ohm
Lead Channel Impedance Value: 456 Ohm
Lead Channel Impedance Value: 513 Ohm
Lead Channel Impedance Value: 589 Ohm
Lead Channel Impedance Value: 703 Ohm
Lead Channel Impedance Value: 722 Ohm
Lead Channel Impedance Value: 760 Ohm
Lead Channel Impedance Value: 817 Ohm
Lead Channel Impedance Value: 836 Ohm
Lead Channel Impedance Value: 874 Ohm
Lead Channel Pacing Threshold Amplitude: 0.5 V
Lead Channel Pacing Threshold Amplitude: 0.625 V
Lead Channel Pacing Threshold Amplitude: 1.25 V
Lead Channel Pacing Threshold Pulse Width: 0.4 ms
Lead Channel Pacing Threshold Pulse Width: 0.4 ms
Lead Channel Pacing Threshold Pulse Width: 0.4 ms
Lead Channel Sensing Intrinsic Amplitude: 1.875 mV
Lead Channel Sensing Intrinsic Amplitude: 1.875 mV
Lead Channel Sensing Intrinsic Amplitude: 8.875 mV
Lead Channel Sensing Intrinsic Amplitude: 8.875 mV
Lead Channel Setting Pacing Amplitude: 1.5 V
Lead Channel Setting Pacing Amplitude: 1.5 V
Lead Channel Setting Pacing Amplitude: 2 V
Lead Channel Setting Pacing Pulse Width: 0.4 ms
Lead Channel Setting Pacing Pulse Width: 0.4 ms
Lead Channel Setting Sensing Sensitivity: 0.3 mV
Zone Setting Status: 755011
Zone Setting Status: 755011

## 2023-07-15 NOTE — Progress Notes (Signed)
Remote ICD transmission.   

## 2023-08-20 ENCOUNTER — Telehealth: Payer: Self-pay | Admitting: *Deleted

## 2023-08-20 DIAGNOSIS — E1169 Type 2 diabetes mellitus with other specified complication: Secondary | ICD-10-CM

## 2023-08-20 DIAGNOSIS — E119 Type 2 diabetes mellitus without complications: Secondary | ICD-10-CM

## 2023-08-20 DIAGNOSIS — Z125 Encounter for screening for malignant neoplasm of prostate: Secondary | ICD-10-CM

## 2023-08-20 NOTE — Telephone Encounter (Signed)
-----   Message from Alvina Chou sent at 08/20/2023  3:33 PM EST ----- Regarding: Lab orders for [facility], 09/05/23 Patient is scheduled for CPX labs, please order future labs, Thanks , Camelia Eng

## 2023-08-31 ENCOUNTER — Other Ambulatory Visit: Payer: Self-pay | Admitting: Family Medicine

## 2023-09-04 ENCOUNTER — Encounter: Payer: Medicare HMO | Admitting: Family Medicine

## 2023-09-05 ENCOUNTER — Other Ambulatory Visit (INDEPENDENT_AMBULATORY_CARE_PROVIDER_SITE_OTHER): Payer: Medicare HMO

## 2023-09-05 ENCOUNTER — Telehealth: Payer: Self-pay | Admitting: Family Medicine

## 2023-09-05 DIAGNOSIS — Z794 Long term (current) use of insulin: Secondary | ICD-10-CM

## 2023-09-05 DIAGNOSIS — Z125 Encounter for screening for malignant neoplasm of prostate: Secondary | ICD-10-CM

## 2023-09-05 DIAGNOSIS — E1169 Type 2 diabetes mellitus with other specified complication: Secondary | ICD-10-CM

## 2023-09-05 DIAGNOSIS — E119 Type 2 diabetes mellitus without complications: Secondary | ICD-10-CM

## 2023-09-05 DIAGNOSIS — E785 Hyperlipidemia, unspecified: Secondary | ICD-10-CM

## 2023-09-05 LAB — LIPID PANEL
Cholesterol: 136 mg/dL (ref 0–200)
HDL: 75.9 mg/dL (ref >39.00)
LDL Cholesterol: 50 mg/dL (ref 0–99)
NonHDL: 59.75
Total CHOL/HDL Ratio: 2
Triglycerides: 51 mg/dL (ref 0.0–149.0)
VLDL: 10.2 mg/dL (ref 0.0–40.0)

## 2023-09-05 LAB — COMPREHENSIVE METABOLIC PANEL
ALT: 11 U/L (ref 0–53)
AST: 16 U/L (ref 0–37)
Albumin: 4.1 g/dL (ref 3.5–5.2)
Alkaline Phosphatase: 61 U/L (ref 39–117)
BUN: 20 mg/dL (ref 6–23)
CO2: 30 meq/L (ref 19–32)
Calcium: 8.9 mg/dL (ref 8.4–10.5)
Chloride: 104 meq/L (ref 96–112)
Creatinine, Ser: 1.09 mg/dL (ref 0.40–1.50)
GFR: 77.06 mL/min (ref >60.00)
Glucose, Bld: 93 mg/dL (ref 70–99)
Potassium: 4.8 meq/L (ref 3.5–5.1)
Sodium: 140 meq/L (ref 135–145)
Total Bilirubin: 0.6 mg/dL (ref 0.2–1.2)
Total Protein: 7.5 g/dL (ref 6.0–8.3)

## 2023-09-05 LAB — MICROALBUMIN / CREATININE URINE RATIO
Creatinine,U: 131.1 mg/dL
Microalb Creat Ratio: 0.6 mg/g (ref 0.0–30.0)
Microalb, Ur: 0.8 mg/dL (ref 0.0–1.9)

## 2023-09-05 LAB — PSA, MEDICARE: PSA: 0.26 ng/mL (ref 0.10–4.00)

## 2023-09-05 LAB — HEMOGLOBIN A1C: Hgb A1c MFr Bld: 6.6 % — ABNORMAL HIGH (ref 4.6–6.5)

## 2023-09-05 MED ORDER — EMPAGLIFLOZIN 10 MG PO TABS: 10.0000 mg | ORAL_TABLET | Freq: Every day | ORAL | 0 refills | Status: DC

## 2023-09-05 NOTE — Telephone Encounter (Signed)
Patient called in and stated that he has a refill coming for his empagliflozin (JARDIANCE) 10 MG TABS tablet through mail order. He was wanting to know if some could be sent in to the local pharmacy [id number] -  (N), [place] - [address]. He stated that he is completely out of the medication. He would like a call if this can or can not be done. Thank you!

## 2023-09-05 NOTE — Telephone Encounter (Signed)
Refill sent to [facility] as requested.  Dagen notified of this via telephone.

## 2023-09-05 NOTE — Progress Notes (Signed)
No critical labs need to be addressed urgently. We will discuss labs in detail at upcoming office visit.   

## 2023-09-12 ENCOUNTER — Ambulatory Visit: Payer: Medicare HMO | Admitting: Family Medicine

## 2023-09-12 VITALS — BP 100/70 | HR 68 | Temp 98.1°F | Ht 71.0 in | Wt 205.5 lb

## 2023-09-12 DIAGNOSIS — Z0000 Encounter for general adult medical examination without abnormal findings: Secondary | ICD-10-CM | POA: Diagnosis not present

## 2023-09-12 DIAGNOSIS — I5042 Chronic combined systolic (congestive) and diastolic (congestive) heart failure: Secondary | ICD-10-CM | POA: Diagnosis not present

## 2023-09-12 DIAGNOSIS — E1169 Type 2 diabetes mellitus with other specified complication: Secondary | ICD-10-CM

## 2023-09-12 DIAGNOSIS — Z23 Encounter for immunization: Secondary | ICD-10-CM

## 2023-09-12 DIAGNOSIS — Z794 Long term (current) use of insulin: Secondary | ICD-10-CM

## 2023-09-12 DIAGNOSIS — T380X5A Adverse effect of glucocorticoids and synthetic analogues, initial encounter: Secondary | ICD-10-CM

## 2023-09-12 DIAGNOSIS — G720 Drug-induced myopathy: Secondary | ICD-10-CM | POA: Diagnosis not present

## 2023-09-12 DIAGNOSIS — I152 Hypertension secondary to endocrine disorders: Secondary | ICD-10-CM | POA: Diagnosis not present

## 2023-09-12 DIAGNOSIS — E785 Hyperlipidemia, unspecified: Secondary | ICD-10-CM | POA: Diagnosis not present

## 2023-09-12 DIAGNOSIS — E1159 Type 2 diabetes mellitus with other circulatory complications: Secondary | ICD-10-CM

## 2023-09-12 DIAGNOSIS — E119 Type 2 diabetes mellitus without complications: Secondary | ICD-10-CM

## 2023-09-12 LAB — HM DIABETES EYE EXAM

## 2023-09-12 MED ORDER — DEXCOM G7 SENSOR MISC: 3 refills | Status: DC

## 2023-09-12 NOTE — Assessment & Plan Note (Signed)
Stable, chronic.  Continue current medication.   Entresto 97/103 mg daily   aldactone 25 mg daily dogoxin 0.125 mg daily  coreg 12.5 mg BID

## 2023-09-12 NOTE — Assessment & Plan Note (Addendum)
Chronic, followed by cardiology. Euvolemic in office today  On Entresto and Jardiance

## 2023-09-12 NOTE — Progress Notes (Signed)
Patient ID: Philip Ponce, male    DOB: [birth year], 52 y.o.   MRN: [id number]  This visit was conducted in person.  BP 100/70 (BP Location: Left Arm, Patient Position: Sitting, Cuff Size: Large)   Pulse 68   Temp 98.1 F (36.7 C) (Temporal)   Ht 5\' 11"  (1.803 m)   Wt 205 lb 8 oz (93.2 kg)   SpO2 97%   BMI 28.66 kg/m    CC:  Chief Complaint  Patient presents with   Medicare Wellness    Subjective:   HPI: Philip Ponce is a 52 y.o. male presenting on 09/12/2023 for Medicare Wellness  The patient presents for annual medicare wellness, complete physical and review of chronic health problems. He/She also has the following acute concerns today: none  I have personally reviewed the Medicare Annual Wellness questionnaire and have noted 1. The patient's medical and social history 2. Their use of alcohol, tobacco or illicit drugs 3. Their current medications and supplements 4. The patient's functional ability including ADL's, fall risks, home safety risks and hearing or visual             impairment. 5. Diet and physical activities 6. Evidence for depression or mood disorders 7.         Updated provider list Cognitive evaluation was performed and recorded on pt medicare questionnaire form. The patients weight, height, BMI and visual acuity have been recorded in the chart   I have made referrals, counseling and provided education to the patient based review of the above and I have provided the pt with a written personalized care plan for preventive services.   Documentation of this information was scanned into the electronic record under the media tab.   Advance directives and end of life planning reviewed in detail with patient and documented in EMR. Patient given handout on advance care directives if needed. HCPOA and living will updated if needed.  No falls in last 12 months.  Hearing Screening  Method: Audiometry   500Hz  1000Hz  2000Hz  4000Hz   Right ear 20 20 20  20   Left ear 20 20 20 20   Vision Screening - Comments:: Eye Exam at  [facility] Vision-Office note requested  The patient presents for  complete physical and review of chronic health problems. He/She also has the following acute concerns today:none  The patient saw a LPN or RN for medicare wellness visit.  Prevention and wellness was reviewed in detail. Note reviewed and important notes copied below.   Chronic combined systolic and diastolic heart failure: Euvolemic in office today  Hypertension:  Well-controlled on Toprol-XL 100 mg p.o. daily, Entresto 97/103 mg daily and spironolactone 25 mg daily  BP Readings from Last 3 Encounters:  09/12/23 100/70  05/20/23 114/72  03/08/23 120/72  Using medication without problems or lightheadedness:  none Chest pain with exertion: Edema: none Short of breath: stable Average home BPs: Other issues:  Elevated Cholesterol: LDL at goal on no medication.. myalgia associated with statin Lab Results  Component Value Date   CHOL 136 09/05/2023   HDL 75.90 09/05/2023   LDLCALC 50 09/05/2023   TRIG 51.0 09/05/2023   CHOLHDL 2 09/05/2023  Using medications without problems: Muscle aches:  Diet compliance: healthy eating Exercise: 3 times a week Other complaints: normal microalbiumin  Diabetes: Improved control  on  metformin XR 500 mg daily and Basaglar  20 units daily, jardiance 10 mg daily  Lab Results  Component Value Date   HGBA1C 6.6 (H)  09/05/2023  Using medications without difficulties: Hypoglycemic episodes: Hyperglycemic episodes: Feet problems: no ulcer Blood Sugars averaging:  CGM.. ran out/.. refilled today. eye exam within last year: yes  Wt Readings from Last 3 Encounters:  09/12/23 205 lb 8 oz (93.2 kg)  05/20/23 211 lb 3.2 oz (95.8 kg)  03/08/23 208 lb 2 oz (94.4 kg)     Patient Care Team: Excell Seltzer, MD as PCP - General Regan Lemming, MD as PCP - Electrophysiology (Cardiology) End, Cristal Deer, MD as PCP  - Cardiology (Cardiology) Delma Freeze, FNP as Nurse Practitioner (Family Medicine) Kathyrn Sheriff, [facility] (Inactive) as Pharmacist (Pharmacist)    Relevant past medical, surgical, family and social history reviewed and updated as indicated. Interim medical history since our last visit reviewed. Allergies and medications reviewed and updated. Outpatient Medications Prior to Visit  Medication Sig Dispense Refill   B-D ULTRAFINE III SHORT PEN 31G X 8 MM MISC USE TO INJECT INSULIN DAILY 90 each 3   Blood Glucose Monitoring Suppl (ONE TOUCH ULTRA 2) w/Device KIT Use to check blood sugar up to 2 times a day 1 kit 0   calcium carbonate (OS-CAL) 600 MG TABS tablet Take 1,200 mg by mouth daily with breakfast.      digoxin (LANOXIN) 0.125 MG tablet Take 1 tablet (125 mcg total) by mouth daily. 30 tablet 3   empagliflozin (JARDIANCE) 10 MG TABS tablet Take 1 tablet (10 mg total) by mouth daily before breakfast. 90 tablet 0   glucose blood (ONE TOUCH ULTRA TEST) test strip Check blood sugar up to 2 times a day and as instructed. Dx E11.9 100 each 5   Insulin Glargine (BASAGLAR KWIKPEN) 100 UNIT/ML Inject 18 Units into the skin daily.     Lancets (ONETOUCH ULTRASOFT) lancets Use to check blood sugar up to 2 times a day. 100 each 5   metFORMIN (GLUCOPHAGE-XR) 500 MG 24 hr tablet Take 1 tablet (500 mg total) by mouth 2 (two) times daily with a meal. 180 tablet 3   metoprolol succinate (TOPROL-XL) 100 MG 24 hr tablet Take 1 tablet (100 mg total) by mouth daily. Take with or immediately following a meal. 30 tablet 0   Multiple Vitamins-Minerals (ADULT GUMMY PO) Take 2 tablets by mouth daily.     omeprazole (PRILOSEC) 40 MG capsule TAKE 1 CAPSULE DAILY AS    NEEDED FOR INDIGESTION 90 capsule 3   OVER THE COUNTER MEDICATION SEA MOSS SUPPLEMENT     sacubitril-valsartan (ENTRESTO) 97-103 MG Take 1 tablet by mouth 2 (two) times daily. 180 tablet 3   spironolactone (ALDACTONE) 25 MG tablet Take 1 tablet (25  mg total) by mouth daily. 90 tablet 3   Continuous Blood Gluc Sensor (DEXCOM G7 SENSOR) MISC by Does not apply route.     Insulin Glargine (BASAGLAR KWIKPEN) 100 UNIT/ML Inject 20 Units into the skin daily. (Patient taking differently: Inject 18 Units into the skin daily.) 15 mL 3   No facility-administered medications prior to visit.     Per HPI unless specifically indicated in ROS section below Review of Systems  Constitutional:  Negative for fatigue and fever.  HENT:  Negative for ear pain.   Eyes:  Negative for pain.  Respiratory:  Negative for cough and shortness of breath.   Cardiovascular:  Negative for chest pain, palpitations and leg swelling.  Gastrointestinal:  Negative for abdominal pain.  Genitourinary:  Negative for dysuria.  Musculoskeletal:  Negative for arthralgias.  Neurological:  Negative for syncope,  light-headedness and headaches.  Psychiatric/Behavioral:  Negative for dysphoric mood.    Objective:  BP 100/70 (BP Location: Left Arm, Patient Position: Sitting, Cuff Size: Large)   Pulse 68   Temp 98.1 F (36.7 C) (Temporal)   Ht 5\' 11"  (1.803 m)   Wt 205 lb 8 oz (93.2 kg)   SpO2 97%   BMI 28.66 kg/m   Wt Readings from Last 3 Encounters:  09/12/23 205 lb 8 oz (93.2 kg)  05/20/23 211 lb 3.2 oz (95.8 kg)  03/08/23 208 lb 2 oz (94.4 kg)      Physical Exam Constitutional:      Appearance: He is well-developed.  HENT:     Head: Normocephalic.     Right Ear: Hearing normal.     Left Ear: Hearing normal.     Nose: Nose normal.  Neck:     Thyroid: No thyroid mass or thyromegaly.     Vascular: No carotid bruit.     Trachea: Trachea normal.  Cardiovascular:     Rate and Rhythm: Normal rate and regular rhythm.     Pulses: Normal pulses.     Heart sounds: Heart sounds not distant. No murmur heard.    No friction rub. No gallop.     Comments: No peripheral edema Pulmonary:     Effort: Pulmonary effort is normal. No respiratory distress.     Breath sounds:  Normal breath sounds.  Skin:    General: Skin is warm and dry.     Findings: No rash.  Psychiatric:        Speech: Speech normal.        Behavior: Behavior normal.        Thought Content: Thought content normal.       Results for orders placed or performed in visit on 09/05/23  PSA, Medicare   Collection Time: 09/05/23  8:52 AM  Result Value Ref Range   PSA 0.26 0.10 - 4.00 ng/ml  Microalbumin / creatinine urine ratio   Collection Time: 09/05/23  8:52 AM  Result Value Ref Range   Microalb, Ur 0.8 0.0 - 1.9 mg/dL   Creatinine,U 409.8 mg/dL   Microalb Creat Ratio 0.6 0.0 - 30.0 mg/g  Comprehensive metabolic panel   Collection Time: 09/05/23  8:52 AM  Result Value Ref Range   Sodium 140 135 - 145 mEq/L   Potassium 4.8 3.5 - 5.1 mEq/L   Chloride 104 96 - 112 mEq/L   CO2 30 19 - 32 mEq/L   Glucose, Bld 93 70 - 99 mg/dL   BUN 20 6 - 23 mg/dL   Creatinine, Ser 1.19 0.40 - 1.50 mg/dL   Total Bilirubin 0.6 0.2 - 1.2 mg/dL   Alkaline Phosphatase 61 39 - 117 U/L   AST 16 0 - 37 U/L   ALT 11 0 - 53 U/L   Total Protein 7.5 6.0 - 8.3 g/dL   Albumin 4.1 3.5 - 5.2 g/dL   GFR 14.78 >29.56 mL/min   Calcium 8.9 8.4 - 10.5 mg/dL  Lipid panel   Collection Time: 09/05/23  8:52 AM  Result Value Ref Range   Cholesterol 136 0 - 200 mg/dL   Triglycerides 21.3 0.0 - 149.0 mg/dL   HDL 08.65 >78.46 mg/dL   VLDL 96.2 0.0 - 95.2 mg/dL   LDL Cholesterol 50 0 - 99 mg/dL   Total CHOL/HDL Ratio 2    NonHDL 59.75   Hemoglobin A1c   Collection Time: 09/05/23  8:52 AM  Result Value  Ref Range   Hgb A1c MFr Bld 6.6 (H) 4.6 - 6.5 %     COVID 19 screen:  No recent travel or known exposure to COVID19 The patient denies respiratory symptoms of COVID 19 at this time. The importance of social distancing was discussed today.   Assessment and Plan   The patient's preventative maintenance and recommended screening tests for an annual wellness exam were reviewed in full today. Brought up to date unless  services declined.  Counselled on the importance of diet, exercise, and its role in overall health and mortality. The patient's FH and SH was reviewed, including their home life, tobacco status, and drug and alcohol status.   Vaccines: 4 x COVID vaccine, tdap due but ot covered, PNA uptodate, given flu Prostate Cancer Screen:  Lab Results  Component Value Date   PSA 0.26 09/05/2023   PSA 0.16 08/08/2021   PSA 0.12 07/19/2020  Colon Cancer Screen:  2018, and 09/2022, repeat in 10 years      Smoking Status:nonsmoker ETOH/ drug use: none/none  Hep C:  done  HIV screen:   refused   Medicare annual wellness visit, subsequent  Need for influenza vaccination -     Flu vaccine trivalent PF, 6mos and older(Flulaval,Afluria,Fluarix,Fluzone)  Type 2 diabetes mellitus without complication, with long-term current use of insulin (HCC) Assessment & Plan:  Chronic, continued improvement,  now at goal on current regimen.  On Basaglar 20 units daily.. has decreased to 18 Metformin XL 500 mg daily Continued jardiance 10 mg daily  Reevaluate in 6 months.  Orders: -     Dexcom G7 Sensor; Use to check blood sugar continuous  Dispense: 6 each; Refill: 3  Hyperlipidemia associated with type 2 diabetes mellitus (HCC) Assessment & Plan: LDL at goal  in past  on no medication.. myalgia associated with statin    Orders: -     Dexcom G7 Sensor; Use to check blood sugar continuous  Dispense: 6 each; Refill: 3  Hypertension associated with diabetes (HCC) Assessment & Plan: Stable, chronic.  Continue current medication.   Entresto 97/103 mg daily   aldactone 25 mg daily dogoxin 0.125 mg daily  coreg 12.5 mg BID  Orders: -     Dexcom G7 Sensor; Use to check blood sugar continuous  Dispense: 6 each; Refill: 3  Chronic combined systolic and diastolic heart failure (HCC) Assessment & Plan: Chronic, followed by cardiology. Euvolemic in office today  On Entresto and  Jardiance   Steroid-induced myopathy Assessment & Plan: myalgia associated with statin  trials in past.       Kerby Nora, MD

## 2023-09-12 NOTE — Assessment & Plan Note (Signed)
LDL at goal  in past  on no medication.. myalgia associated with statin

## 2023-09-12 NOTE — Assessment & Plan Note (Signed)
Followed by cardiology 

## 2023-09-12 NOTE — Assessment & Plan Note (Signed)
myalgia associated with statin  trials in past.

## 2023-09-12 NOTE — Assessment & Plan Note (Signed)
Chronic, continued improvement,  now at goal on current regimen.  On Basaglar 20 units daily.. has decreased to 18 Metformin XL 500 mg daily Continued jardiance 10 mg daily  Reevaluate in 6 months.

## 2023-09-12 NOTE — Patient Instructions (Signed)
Set up yearly eye exam for diabetes and have the opthalmologist send [place] a copy of the evaluation for the chart.

## 2023-09-13 ENCOUNTER — Other Ambulatory Visit: Payer: Medicare HMO

## 2023-09-16 ENCOUNTER — Encounter: Payer: Self-pay | Admitting: Family Medicine

## 2023-09-19 ENCOUNTER — Encounter: Payer: Medicare HMO | Admitting: Family Medicine

## 2023-09-26 ENCOUNTER — Encounter: Payer: Self-pay | Admitting: Oncology

## 2023-09-26 NOTE — Telephone Encounter (Signed)
 Error

## 2023-10-01 ENCOUNTER — Other Ambulatory Visit: Payer: Self-pay | Admitting: Family Medicine

## 2023-10-01 ENCOUNTER — Ambulatory Visit (INDEPENDENT_AMBULATORY_CARE_PROVIDER_SITE_OTHER): Payer: Medicare HMO

## 2023-10-01 DIAGNOSIS — I5022 Chronic systolic (congestive) heart failure: Secondary | ICD-10-CM

## 2023-10-02 LAB — CUP PACEART REMOTE DEVICE CHECK
Battery Remaining Longevity: 15 mo
Battery Voltage: 2.88 V
Brady Statistic AP VP Percent: 0.01 %
Brady Statistic AP VS Percent: 0 %
Brady Statistic AS VP Percent: 98.68 %
Brady Statistic AS VS Percent: 1.31 %
Brady Statistic RA Percent Paced: 0.01 %
Brady Statistic RV Percent Paced: 81.88 %
Date Time Interrogation Session: 20250107063528
HighPow Impedance: 61 Ohm
Implantable Lead Connection Status: 753985
Implantable Lead Connection Status: 753985
Implantable Lead Connection Status: 753985
Implantable Lead Implant Date: 20181003
Implantable Lead Implant Date: 20181003
Implantable Lead Implant Date: 20181003
Implantable Lead Location: 753858
Implantable Lead Location: 753859
Implantable Lead Location: 753860
Implantable Lead Model: 4398
Implantable Lead Model: 5076
Implantable Pulse Generator Implant Date: 20181003
Lead Channel Impedance Value: 193.707
Lead Channel Impedance Value: 211.891
Lead Channel Impedance Value: 226.51 Ohm
Lead Channel Impedance Value: 230.327
Lead Channel Impedance Value: 247.704
Lead Channel Impedance Value: 285 Ohm
Lead Channel Impedance Value: 361 Ohm
Lead Channel Impedance Value: 361 Ohm
Lead Channel Impedance Value: 418 Ohm
Lead Channel Impedance Value: 475 Ohm
Lead Channel Impedance Value: 513 Ohm
Lead Channel Impedance Value: 608 Ohm
Lead Channel Impedance Value: 703 Ohm
Lead Channel Impedance Value: 760 Ohm
Lead Channel Impedance Value: 779 Ohm
Lead Channel Impedance Value: 817 Ohm
Lead Channel Impedance Value: 874 Ohm
Lead Channel Impedance Value: 874 Ohm
Lead Channel Pacing Threshold Amplitude: 0.625 V
Lead Channel Pacing Threshold Amplitude: 0.625 V
Lead Channel Pacing Threshold Amplitude: 1.25 V
Lead Channel Pacing Threshold Pulse Width: 0.4 ms
Lead Channel Pacing Threshold Pulse Width: 0.4 ms
Lead Channel Pacing Threshold Pulse Width: 0.4 ms
Lead Channel Sensing Intrinsic Amplitude: 2.25 mV
Lead Channel Sensing Intrinsic Amplitude: 2.25 mV
Lead Channel Sensing Intrinsic Amplitude: 8.125 mV
Lead Channel Sensing Intrinsic Amplitude: 8.125 mV
Lead Channel Setting Pacing Amplitude: 1.5 V
Lead Channel Setting Pacing Amplitude: 1.5 V
Lead Channel Setting Pacing Amplitude: 2 V
Lead Channel Setting Pacing Pulse Width: 0.4 ms
Lead Channel Setting Pacing Pulse Width: 0.4 ms
Lead Channel Setting Sensing Sensitivity: 0.3 mV
Zone Setting Status: 755011
Zone Setting Status: 755011

## 2023-10-11 ENCOUNTER — Other Ambulatory Visit: Payer: Self-pay | Admitting: Family Medicine

## 2023-10-14 ENCOUNTER — Telehealth: Payer: Self-pay | Admitting: Family Medicine

## 2023-10-14 MED ORDER — METOPROLOL SUCCINATE ER 100 MG PO TB24: 100.0000 mg | ORAL_TABLET | Freq: Every day | ORAL | 0 refills | Status: DC

## 2023-10-14 NOTE — Telephone Encounter (Signed)
Copied from CRM [id number]. Topic: Clinical - Medication Refill >> Oct 14, 2023  8:08 AM Almira Coaster wrote: Most Recent Primary Care Visit:  Provider: Kerby Nora E  Department: [facility]  Visit Type: PHYSICAL  Date: 09/12/2023  Medication: metoprolol succinate (TOPROL-XL) 100 MG 24 hr tablet  Has the patient contacted their pharmacy? Yes, please send prescription to local pharmacy as patient is completely out and the mail order pharmacy takes 5 days to refill.  (Agent: If no, request that the patient contact the pharmacy for the refill. If patient does not wish to contact the pharmacy document the reason why and proceed with request.) (Agent: If yes, when and what did the pharmacy advise?)  Is this the correct pharmacy for this prescription? Yes If no, delete pharmacy and type the correct one.  This is the patient's preferred pharmacy:    [facility] [address] ([address]), [place] - [address] [place]) [place] [address] Phone: [phone] Fax: [phone]   Has the prescription been filled recently? No  Is the patient out of the medication? Yes  Has the patient been seen for an appointment in the last year OR does the patient have an upcoming appointment? Yes  Can we respond through MyChart? Yes  Agent: Please be advised that Rx refills may take up to 3 business days. We ask that you follow-up with your pharmacy.

## 2023-10-17 ENCOUNTER — Other Ambulatory Visit: Payer: Self-pay | Admitting: Family Medicine

## 2023-10-17 DIAGNOSIS — Z794 Long term (current) use of insulin: Secondary | ICD-10-CM

## 2023-10-17 DIAGNOSIS — E119 Type 2 diabetes mellitus without complications: Secondary | ICD-10-CM

## 2023-10-24 DIAGNOSIS — Z008 Encounter for other general examination: Secondary | ICD-10-CM | POA: Diagnosis not present

## 2023-11-12 NOTE — Addendum Note (Signed)
Addended by: Geralyn Flash D on: 11/12/2023 12:27 PM   Modules accepted: Orders

## 2023-11-12 NOTE — Progress Notes (Signed)
 Remote ICD transmission.

## 2023-11-19 ENCOUNTER — Telehealth: Payer: Self-pay | Admitting: Cardiology

## 2023-11-19 NOTE — Telephone Encounter (Signed)
 Returned call to Pt's wife.  Advised Pt had 15 months left on his battery.  Discussed tones she may hear from Pt's device and what they mean.  All questions answered.  Scheduled follow up appointment.

## 2023-11-19 NOTE — Telephone Encounter (Signed)
 Wife states during last visit patient was advised he would need to have battery replaced in the near future. She would like to know how soon this will need to be done. She says she remembers they were advised the device will sound an alarm, but she would like to know if the office will alert them as well. Wife states she hadn't realized patient wasn't seen last year. Not sure whether patient needs to see APP for follow-up or Dr. Elberta Fortis to discuss having battery replaced. Please advise.

## 2023-12-03 DIAGNOSIS — E119 Type 2 diabetes mellitus without complications: Secondary | ICD-10-CM | POA: Diagnosis not present

## 2023-12-03 DIAGNOSIS — G4733 Obstructive sleep apnea (adult) (pediatric): Secondary | ICD-10-CM | POA: Diagnosis not present

## 2023-12-03 DIAGNOSIS — Z95810 Presence of automatic (implantable) cardiac defibrillator: Secondary | ICD-10-CM | POA: Diagnosis not present

## 2023-12-03 DIAGNOSIS — I428 Other cardiomyopathies: Secondary | ICD-10-CM | POA: Diagnosis not present

## 2023-12-03 DIAGNOSIS — E7800 Pure hypercholesterolemia, unspecified: Secondary | ICD-10-CM | POA: Diagnosis not present

## 2023-12-03 DIAGNOSIS — I10 Essential (primary) hypertension: Secondary | ICD-10-CM | POA: Diagnosis not present

## 2023-12-03 DIAGNOSIS — I5042 Chronic combined systolic (congestive) and diastolic (congestive) heart failure: Secondary | ICD-10-CM | POA: Diagnosis not present

## 2023-12-03 DIAGNOSIS — Z794 Long term (current) use of insulin: Secondary | ICD-10-CM | POA: Diagnosis not present

## 2023-12-03 DIAGNOSIS — Z950 Presence of cardiac pacemaker: Secondary | ICD-10-CM | POA: Diagnosis not present

## 2023-12-03 DIAGNOSIS — Z9884 Bariatric surgery status: Secondary | ICD-10-CM | POA: Diagnosis not present

## 2023-12-03 DIAGNOSIS — I5020 Unspecified systolic (congestive) heart failure: Secondary | ICD-10-CM | POA: Diagnosis not present

## 2023-12-19 DIAGNOSIS — I5042 Chronic combined systolic (congestive) and diastolic (congestive) heart failure: Secondary | ICD-10-CM | POA: Diagnosis not present

## 2023-12-19 DIAGNOSIS — I428 Other cardiomyopathies: Secondary | ICD-10-CM | POA: Diagnosis not present

## 2023-12-24 ENCOUNTER — Encounter: Payer: Self-pay | Admitting: Student

## 2023-12-24 ENCOUNTER — Ambulatory Visit: Attending: Student | Admitting: Student

## 2023-12-24 ENCOUNTER — Encounter: Payer: Medicare HMO | Admitting: Physician Assistant

## 2023-12-24 VITALS — BP 132/90 | HR 76 | Ht 72.0 in | Wt 212.2 lb

## 2023-12-24 DIAGNOSIS — I5022 Chronic systolic (congestive) heart failure: Secondary | ICD-10-CM | POA: Diagnosis not present

## 2023-12-24 DIAGNOSIS — I447 Left bundle-branch block, unspecified: Secondary | ICD-10-CM

## 2023-12-24 DIAGNOSIS — I428 Other cardiomyopathies: Secondary | ICD-10-CM | POA: Diagnosis not present

## 2023-12-24 LAB — CUP PACEART INCLINIC DEVICE CHECK
Battery Remaining Longevity: 11 mo
Battery Voltage: 2.87 V
Brady Statistic AP VP Percent: 0.01 %
Brady Statistic AP VS Percent: 0 %
Brady Statistic AS VP Percent: 98.7 %
Brady Statistic AS VS Percent: 1.28 %
Brady Statistic RA Percent Paced: 0.01 %
Brady Statistic RV Percent Paced: 83.14 %
Date Time Interrogation Session: 20250401113503
HighPow Impedance: 57 Ohm
Implantable Lead Connection Status: 753985
Implantable Lead Connection Status: 753985
Implantable Lead Connection Status: 753985
Implantable Lead Implant Date: 20181003
Implantable Lead Implant Date: 20181003
Implantable Lead Implant Date: 20181003
Implantable Lead Location: 753858
Implantable Lead Location: 753859
Implantable Lead Location: 753860
Implantable Lead Model: 4398
Implantable Lead Model: 5076
Implantable Pulse Generator Implant Date: 20181003
Lead Channel Impedance Value: 184.154
Lead Channel Impedance Value: 205.2 Ohm
Lead Channel Impedance Value: 218.88 Ohm
Lead Channel Impedance Value: 224.438
Lead Channel Impedance Value: 240.906
Lead Channel Impedance Value: 285 Ohm
Lead Channel Impedance Value: 342 Ohm
Lead Channel Impedance Value: 342 Ohm
Lead Channel Impedance Value: 399 Ohm
Lead Channel Impedance Value: 456 Ohm
Lead Channel Impedance Value: 513 Ohm
Lead Channel Impedance Value: 608 Ohm
Lead Channel Impedance Value: 646 Ohm
Lead Channel Impedance Value: 722 Ohm
Lead Channel Impedance Value: 760 Ohm
Lead Channel Impedance Value: 760 Ohm
Lead Channel Impedance Value: 817 Ohm
Lead Channel Impedance Value: 874 Ohm
Lead Channel Pacing Threshold Amplitude: 0.625 V
Lead Channel Pacing Threshold Amplitude: 0.625 V
Lead Channel Pacing Threshold Amplitude: 1.25 V
Lead Channel Pacing Threshold Pulse Width: 0.4 ms
Lead Channel Pacing Threshold Pulse Width: 0.4 ms
Lead Channel Pacing Threshold Pulse Width: 0.4 ms
Lead Channel Sensing Intrinsic Amplitude: 1.875 mV
Lead Channel Sensing Intrinsic Amplitude: 2.375 mV
Lead Channel Sensing Intrinsic Amplitude: 7.125 mV
Lead Channel Sensing Intrinsic Amplitude: 8.625 mV
Lead Channel Setting Pacing Amplitude: 1.5 V
Lead Channel Setting Pacing Amplitude: 2 V
Lead Channel Setting Pacing Amplitude: 2 V
Lead Channel Setting Pacing Pulse Width: 0.4 ms
Lead Channel Setting Pacing Pulse Width: 0.4 ms
Lead Channel Setting Sensing Sensitivity: 0.3 mV
Zone Setting Status: 755011
Zone Setting Status: 755011

## 2023-12-24 NOTE — Patient Instructions (Signed)
 Medication Instructions:  Your physician recommends that you continue on your current medications as directed. Please refer to the Current Medication list given to you today.  *If you need a refill on your cardiac medications before your next appointment, please call your pharmacy*  Lab Work: None ordered If you have labs (blood work) drawn today and your tests are completely normal, you will receive your results only by: MyChart Message (if you have MyChart) OR A paper copy in the mail If you have any lab test that is abnormal or we need to change your treatment, we will call you to review the results.  Follow-Up: At [facility], you and your health needs are our priority.  As part of our continuing mission to provide you with exceptional heart care, our providers are all part of one team.  This team includes your primary Cardiologist (physician) and Advanced Practice Providers or APPs (Physician Assistants and Nurse Practitioners) who all work together to provide you with the care you need, when you need it.  Your next appointment:   March 2026  Provider:   Loman Brooklyn, MD or [facility]" [place], [place]       1st Floor: - Lobby - Registration  - Pharmacy  - Lab - Cafe  2nd Floor: - PV Lab - Diagnostic Testing (echo, CT, nuclear med)  3rd Floor: - Vacant  4th Floor: - TCTS (cardiothoracic surgery) - AFib Clinic - Structural Heart Clinic - Vascular Surgery  - Vascular Ultrasound  5th Floor: - [facility] (general and EP) - Clinical Pharmacy for coumadin, hypertension, lipid, weight-loss medications, and med management appointments    Valet parking services will be available as well.

## 2023-12-24 NOTE — Progress Notes (Signed)
  Electrophysiology Office Note:   ID:  Philip Ponce, Philip Ponce 1971-06-24, MRN [id number]  Primary Cardiologist: Yvonne Kendall, MD Electrophysiologist: Regan Lemming, MD      History of Present Illness:   Philip Ponce is a 53 y.o. male with h/o DM2, NICm, Chronic systolic CHF, and h/o GI bleed seen today for routine electrophysiology followup.   Since last being seen in our clinic the patient reports doing well from a cardiac perspective. Overall,  he denies chest pain, palpitations, dyspnea, PND, orthopnea, nausea, vomiting, dizziness, syncope, edema, weight gain, or early satiety.   Review of systems complete and found to be negative unless listed in HPI.   EP Information / Studies Reviewed:    EKG is ordered today. Personal review as below.  EKG Interpretation Date/Time:  Tuesday December 24 2023 10:13:20 EDT Ventricular Rate:  76 PR Interval:  154 QRS Duration:  140 QT Interval:  410 QTC Calculation: 461 R Axis:   269  Text Interpretation: Atrial-sensed ventricular-paced rhythm Biventricular pacemaker detected When compared with ECG of 28-Jun-2018 02:20, PREVIOUS ECG IS PRESENT Confirmed by Maxine Glenn [phone]) on 12/24/2023 10:24:03 AM    ICD Interrogation-  reviewed in detail today,  See PACEART report.  Arrhythmia/Device History Medtronic CRT-D 06/2017 for NICM/CHF   Physical Exam:   VS:  BP (!) 132/90   Pulse 76   Ht 6' (1.829 m)   Wt 212 lb 3.2 oz (96.3 kg)   SpO2 98%   BMI 28.78 kg/m    Wt Readings from Last 3 Encounters:  12/24/23 212 lb 3.2 oz (96.3 kg)  09/12/23 205 lb 8 oz (93.2 kg)  05/20/23 211 lb 3.2 oz (95.8 kg)     GEN: No acute distress  NECK: No JVD; No carotid bruits CARDIAC: Regular rate and rhythm, no murmurs, rubs, gallops RESPIRATORY:  Clear to auscultation without rales, wheezing or rhonchi  ABDOMEN: Soft, non-tender, non-distended EXTREMITIES:  No edema; No deformity   ASSESSMENT AND PLAN:    Chronic systolic CHF  s/p  Medtronic CRT-D  NICM LBBB Echo 12/2017 LVEF 30-35% euvolemic today Stable on an appropriate medical regimen Normal ICD function See Pace Art report No changes today   Disposition:   Follow up with EP APP in 12 months   Signed, Graciella Freer, PA-C

## 2024-01-01 ENCOUNTER — Ambulatory Visit (INDEPENDENT_AMBULATORY_CARE_PROVIDER_SITE_OTHER): Payer: Medicare HMO

## 2024-01-01 DIAGNOSIS — I428 Other cardiomyopathies: Secondary | ICD-10-CM

## 2024-01-01 LAB — CUP PACEART REMOTE DEVICE CHECK
Battery Remaining Longevity: 12 mo
Battery Voltage: 2.87 V
Brady Statistic AP VP Percent: 0 %
Brady Statistic AP VS Percent: 0 %
Brady Statistic AS VP Percent: 98.75 %
Brady Statistic AS VS Percent: 1.25 %
Brady Statistic RA Percent Paced: 0 %
Brady Statistic RV Percent Paced: 80.39 %
Date Time Interrogation Session: 20250409001603
HighPow Impedance: 61 Ohm
Implantable Lead Connection Status: 753985
Implantable Lead Connection Status: 753985
Implantable Lead Connection Status: 753985
Implantable Lead Implant Date: 20181003
Implantable Lead Implant Date: 20181003
Implantable Lead Implant Date: 20181003
Implantable Lead Location: 753858
Implantable Lead Location: 753859
Implantable Lead Location: 753860
Implantable Lead Model: 4398
Implantable Lead Model: 5076
Implantable Pulse Generator Implant Date: 20181003
Lead Channel Impedance Value: 188.1 Ohm
Lead Channel Impedance Value: 198.837
Lead Channel Impedance Value: 218.88 Ohm
Lead Channel Impedance Value: 222.34 Ohm
Lead Channel Impedance Value: 247.704
Lead Channel Impedance Value: 285 Ohm
Lead Channel Impedance Value: 342 Ohm
Lead Channel Impedance Value: 342 Ohm
Lead Channel Impedance Value: 418 Ohm
Lead Channel Impedance Value: 475 Ohm
Lead Channel Impedance Value: 475 Ohm
Lead Channel Impedance Value: 608 Ohm
Lead Channel Impedance Value: 665 Ohm
Lead Channel Impedance Value: 722 Ohm
Lead Channel Impedance Value: 779 Ohm
Lead Channel Impedance Value: 817 Ohm
Lead Channel Impedance Value: 836 Ohm
Lead Channel Impedance Value: 874 Ohm
Lead Channel Pacing Threshold Amplitude: 0.625 V
Lead Channel Pacing Threshold Amplitude: 0.75 V
Lead Channel Pacing Threshold Amplitude: 1.25 V
Lead Channel Pacing Threshold Pulse Width: 0.4 ms
Lead Channel Pacing Threshold Pulse Width: 0.4 ms
Lead Channel Pacing Threshold Pulse Width: 0.4 ms
Lead Channel Sensing Intrinsic Amplitude: 2.125 mV
Lead Channel Sensing Intrinsic Amplitude: 2.125 mV
Lead Channel Sensing Intrinsic Amplitude: 7.625 mV
Lead Channel Sensing Intrinsic Amplitude: 7.625 mV
Lead Channel Setting Pacing Amplitude: 1.5 V
Lead Channel Setting Pacing Amplitude: 2 V
Lead Channel Setting Pacing Amplitude: 2 V
Lead Channel Setting Pacing Pulse Width: 0.4 ms
Lead Channel Setting Pacing Pulse Width: 0.4 ms
Lead Channel Setting Sensing Sensitivity: 0.3 mV
Zone Setting Status: 755011
Zone Setting Status: 755011

## 2024-01-29 ENCOUNTER — Telehealth: Payer: Self-pay

## 2024-01-29 MED ORDER — OMEPRAZOLE 40 MG PO CPDR: 40.0000 mg | DELAYED_RELEASE_CAPSULE | Freq: Every day | ORAL | 0 refills | Status: DC

## 2024-01-29 NOTE — Telephone Encounter (Signed)
 Copied from CRM [id number]. Topic: Clinical - Medication Question >> Jan 29, 2024 10:41 AM Bambi Bonine D wrote: Reason for CRM: Richad Champagne with [facility] pharmacy stated that the patient usually gets the pantoprazole  with a mail order pharmacy but they are going to be delayed with delivering the medication to the patient. Richad Champagne wanted to know if Dr.Bedsole could sent in a small order of the pantoprazole  to the [place] pharmacy until the patient receives his medication.

## 2024-01-29 NOTE — Addendum Note (Signed)
 Addended by: Wyn Heater on: 01/29/2024 02:25 PM   Modules accepted: Orders

## 2024-01-29 NOTE — Telephone Encounter (Signed)
 I show patient is on Omeprazole .  I sent in #30 refill to [facility] [address]

## 2024-02-04 ENCOUNTER — Other Ambulatory Visit
Admission: RE | Admit: 2024-02-04 | Discharge: 2024-02-04 | Disposition: A | Discharge status: Home / Self Care | Source: Ambulatory Visit | Attending: Cardiology | Admitting: Cardiology

## 2024-02-04 DIAGNOSIS — I5042 Chronic combined systolic (congestive) and diastolic (congestive) heart failure: Secondary | ICD-10-CM | POA: Insufficient documentation

## 2024-02-04 LAB — DIGOXIN LEVEL: Digoxin Level: 0.5 ng/mL — ABNORMAL LOW (ref 0.8–2.0)

## 2024-02-14 NOTE — Progress Notes (Signed)
 Remote ICD transmission.

## 2024-03-06 ENCOUNTER — Ambulatory Visit: Admitting: Family Medicine

## 2024-03-12 ENCOUNTER — Ambulatory Visit: Payer: Medicare HMO | Admitting: Family Medicine

## 2024-04-02 ENCOUNTER — Ambulatory Visit: Payer: Medicare HMO

## 2024-04-02 DIAGNOSIS — I428 Other cardiomyopathies: Secondary | ICD-10-CM | POA: Diagnosis not present

## 2024-04-02 LAB — CUP PACEART REMOTE DEVICE CHECK
Battery Remaining Longevity: 9 mo
Battery Voltage: 2.85 V
Brady Statistic AP VP Percent: 0.01 %
Brady Statistic AP VS Percent: 0 %
Brady Statistic AS VP Percent: 98.68 %
Brady Statistic AS VS Percent: 1.31 %
Brady Statistic RA Percent Paced: 0.01 %
Brady Statistic RV Percent Paced: 85.94 %
Date Time Interrogation Session: 20250710022706
HighPow Impedance: 60 Ohm
Implantable Lead Connection Status: 753985
Implantable Lead Connection Status: 753985
Implantable Lead Connection Status: 753985
Implantable Lead Implant Date: 20181003
Implantable Lead Implant Date: 20181003
Implantable Lead Implant Date: 20181003
Implantable Lead Location: 753858
Implantable Lead Location: 753859
Implantable Lead Location: 753860
Implantable Lead Model: 4398
Implantable Lead Model: 5076
Implantable Pulse Generator Implant Date: 20181003
Lead Channel Impedance Value: 176 Ohm
Lead Channel Impedance Value: 176 Ohm
Lead Channel Impedance Value: 195.911
Lead Channel Impedance Value: 209 Ohm
Lead Channel Impedance Value: 237.686
Lead Channel Impedance Value: 304 Ohm
Lead Channel Impedance Value: 304 Ohm
Lead Channel Impedance Value: 342 Ohm
Lead Channel Impedance Value: 418 Ohm
Lead Channel Impedance Value: 418 Ohm
Lead Channel Impedance Value: 456 Ohm
Lead Channel Impedance Value: 551 Ohm
Lead Channel Impedance Value: 608 Ohm
Lead Channel Impedance Value: 646 Ohm
Lead Channel Impedance Value: 665 Ohm
Lead Channel Impedance Value: 722 Ohm
Lead Channel Impedance Value: 760 Ohm
Lead Channel Impedance Value: 779 Ohm
Lead Channel Pacing Threshold Amplitude: 0.625 V
Lead Channel Pacing Threshold Amplitude: 0.75 V
Lead Channel Pacing Threshold Amplitude: 1.25 V
Lead Channel Pacing Threshold Pulse Width: 0.4 ms
Lead Channel Pacing Threshold Pulse Width: 0.4 ms
Lead Channel Pacing Threshold Pulse Width: 0.4 ms
Lead Channel Sensing Intrinsic Amplitude: 1.75 mV
Lead Channel Sensing Intrinsic Amplitude: 1.75 mV
Lead Channel Sensing Intrinsic Amplitude: 8.75 mV
Lead Channel Sensing Intrinsic Amplitude: 8.75 mV
Lead Channel Setting Pacing Amplitude: 1.5 V
Lead Channel Setting Pacing Amplitude: 2 V
Lead Channel Setting Pacing Amplitude: 2 V
Lead Channel Setting Pacing Pulse Width: 0.4 ms
Lead Channel Setting Pacing Pulse Width: 0.4 ms
Lead Channel Setting Sensing Sensitivity: 0.3 mV
Zone Setting Status: 755011
Zone Setting Status: 755011

## 2024-04-05 ENCOUNTER — Other Ambulatory Visit: Payer: Self-pay | Admitting: Family Medicine

## 2024-04-06 NOTE — Telephone Encounter (Signed)
 Please schedule diabetes follow up with Dr. Avelina.

## 2024-04-06 NOTE — Telephone Encounter (Signed)
 Spoke to pt, sch ov for 04/23/24

## 2024-04-08 ENCOUNTER — Ambulatory Visit: Payer: Self-pay | Admitting: Cardiology

## 2024-04-23 ENCOUNTER — Encounter: Payer: Self-pay | Admitting: Family Medicine

## 2024-04-23 ENCOUNTER — Ambulatory Visit: Payer: Self-pay | Admitting: Family Medicine

## 2024-04-23 ENCOUNTER — Ambulatory Visit (INDEPENDENT_AMBULATORY_CARE_PROVIDER_SITE_OTHER): Admitting: Family Medicine

## 2024-04-23 VITALS — BP 100/64 | HR 70 | Temp 98.3°F | Ht 71.0 in | Wt 207.0 lb

## 2024-04-23 DIAGNOSIS — Z794 Long term (current) use of insulin: Secondary | ICD-10-CM

## 2024-04-23 DIAGNOSIS — E1159 Type 2 diabetes mellitus with other circulatory complications: Secondary | ICD-10-CM

## 2024-04-23 DIAGNOSIS — I152 Hypertension secondary to endocrine disorders: Secondary | ICD-10-CM | POA: Diagnosis not present

## 2024-04-23 DIAGNOSIS — E119 Type 2 diabetes mellitus without complications: Secondary | ICD-10-CM

## 2024-04-23 LAB — MICROALBUMIN / CREATININE URINE RATIO
Creatinine,U: 61.6 mg/dL
Microalb Creat Ratio: UNDETERMINED mg/g (ref 0.0–30.0)
Microalb, Ur: <0.7 mg/dL

## 2024-04-23 LAB — POCT GLYCOSYLATED HEMOGLOBIN (HGB A1C): Hemoglobin A1C: 6.7 % — AB (ref 4.0–5.6)

## 2024-04-23 NOTE — Assessment & Plan Note (Signed)
 Stable, chronic.  Continue current medication.   Entresto  97/103 mg daily   aldactone  25 mg daily  Metoprolol  200 mg daily   Digoxin  0.125 mg  daily

## 2024-04-23 NOTE — Progress Notes (Signed)
 Patient ID: Philip Ponce, male    DOB: 1970/12/31, 53 y.o.   MRN: [id number]  This visit was conducted in person.  BP 100/64   Pulse 70   Temp 98.3 F (36.8 C) (Temporal)   Ht 5' 11 (1.803 m)   Wt 207 lb (93.9 kg)   SpO2 98%   BMI 28.87 kg/m    CC:  Chief Complaint  Patient presents with   Diabetes    Subjective:   HPI: Philip Ponce is a 53 y.o. male presenting on 04/23/2024 for Diabetes  Chronic combined systolic and diastolic heart failure: Euvolemic in office today  Reviewed recent OV 02/04/2024 ECHO 30%  Hypertension:  Well-controlled on Toprol -XL 100 mg p.o. daily, Entresto  97/103 mg daily and spironolactone  25 mg daily  BP Readings from Last 3 Encounters:  04/23/24 100/64  12/24/23 (!) 132/90  09/12/23 100/70  Using medication without problems or lightheadedness:  none Chest pain with exertion: Edema: none Short of breath: stable Average home BPs: Other issues:  Diabetes: Improved control  on  metformin  XR 500 mg daily and Basaglar   20 units daily, jardiance  10 mg daily Lab Results  Component Value Date   HGBA1C 6.7 (A) 04/23/2024  Using medications without difficulties: Hypoglycemic episodes: Hyperglycemic episodes: Feet problems: no ulcer Blood Sugars averaging:  CGM did not work out for him.  Not checking lately. eye exam within last year: yes  Wt Readings from Last 3 Encounters:  04/23/24 207 lb (93.9 kg)  12/24/23 212 lb 3.2 oz (96.3 kg)  09/12/23 205 lb 8 oz (93.2 kg)     Patient Care Team: Avelina Greig BRAVO, MD as PCP - General [name], Soyla Lunger, MD as PCP - Electrophysiology (Cardiology) End, Lonni, MD as PCP - Cardiology (Cardiology) Donette Ellouise LABOR, FNP as Nurse Practitioner (Family Medicine) Fate Morna SAILOR, [facility] (Inactive) as Pharmacist (Pharmacist)    Relevant past medical, surgical, family and social history reviewed and updated as indicated. Interim medical history since our last visit reviewed. Allergies  and medications reviewed and updated. Outpatient Medications Prior to Visit  Medication Sig Dispense Refill   B-D ULTRAFINE III SHORT PEN 31G X 8 MM MISC USE TO INJECT INSULIN  DAILY 90 each 3   Blood Glucose Monitoring Suppl (ONE TOUCH ULTRA 2) w/Device KIT Use to check blood sugar up to 2 times a day 1 kit 0   calcium  carbonate (OS-CAL) 600 MG TABS tablet Take 1,200 mg by mouth daily with breakfast.      digoxin  (LANOXIN ) 0.125 MG tablet Take 1 tablet (125 mcg total) by mouth daily. 30 tablet 3   empagliflozin  (JARDIANCE ) 10 MG TABS tablet TAKE 1 TABLET DAILY BEFORE BREAKFAST 90 tablet 0   glucose blood (ONE TOUCH ULTRA TEST) test strip Check blood sugar up to 2 times a day and as instructed. Dx E11.9 100 each 5   Insulin  Glargine (BASAGLAR  KWIKPEN) 100 UNIT/ML Inject 18 Units into the skin daily.     Lancets (ONETOUCH ULTRASOFT) lancets Use to check blood sugar up to 2 times a day. 100 each 5   metFORMIN  (GLUCOPHAGE -XR) 500 MG 24 hr tablet TAKE 1 TABLET DAILY WITH   BREAKFAST 90 tablet 3   metoprolol  (TOPROL -XL) 200 MG 24 hr tablet Take 200 mg by mouth daily.     Multiple Vitamins-Minerals (ADULT GUMMY PO) Take 2 tablets by mouth daily.     omeprazole  (PRILOSEC) 40 MG capsule Take 1 capsule (40 mg total) by mouth daily. 30 capsule  0   OVER THE COUNTER MEDICATION SEA MOSS SUPPLEMENT     sacubitril -valsartan  (ENTRESTO ) 97-103 MG Take 1 tablet by mouth 2 (two) times daily. 180 tablet 3   spironolactone  (ALDACTONE ) 25 MG tablet Take 1 tablet (25 mg total) by mouth daily. 90 tablet 3   Continuous Glucose Sensor (DEXCOM G7 SENSOR) MISC Use to check blood sugar continuous 6 each 3   metoprolol  succinate (TOPROL -XL) 100 MG 24 hr tablet Take 1 tablet (100 mg total) by mouth daily. Take with or immediately following a meal. 30 tablet 0   metoprolol  succinate (TOPROL -XL) 100 MG 24 hr tablet Take 1 tablet (100 mg total) by mouth daily. 90 tablet 1   No facility-administered medications prior to visit.      Per HPI unless specifically indicated in ROS section below Review of Systems  Constitutional:  Negative for fatigue and fever.  HENT:  Negative for ear pain.   Eyes:  Negative for pain.  Respiratory:  Negative for cough and shortness of breath.   Cardiovascular:  Negative for chest pain, palpitations and leg swelling.  Gastrointestinal:  Negative for abdominal pain.  Genitourinary:  Negative for dysuria.  Musculoskeletal:  Negative for arthralgias.  Neurological:  Negative for syncope, light-headedness and headaches.  Psychiatric/Behavioral:  Negative for dysphoric mood.    Objective:  BP 100/64   Pulse 70   Temp 98.3 F (36.8 C) (Temporal)   Ht 5' 11 (1.803 m)   Wt 207 lb (93.9 kg)   SpO2 98%   BMI 28.87 kg/m   Wt Readings from Last 3 Encounters:  04/23/24 207 lb (93.9 kg)  12/24/23 212 lb 3.2 oz (96.3 kg)  09/12/23 205 lb 8 oz (93.2 kg)      Physical Exam Constitutional:      Appearance: He is well-developed.  HENT:     Head: Normocephalic.     Right Ear: Hearing normal.     Left Ear: Hearing normal.     Nose: Nose normal.  Neck:     Thyroid : No thyroid  mass or thyromegaly.     Vascular: No carotid bruit.     Trachea: Trachea normal.  Cardiovascular:     Rate and Rhythm: Normal rate and regular rhythm.     Pulses: Normal pulses.     Heart sounds: Heart sounds not distant. No murmur heard.    No friction rub. No gallop.     Comments: No peripheral edema Pulmonary:     Effort: Pulmonary effort is normal. No respiratory distress.     Breath sounds: Normal breath sounds.  Skin:    General: Skin is warm and dry.     Findings: No rash.  Psychiatric:        Speech: Speech normal.        Behavior: Behavior normal.        Thought Content: Thought content normal.       Results for orders placed or performed in visit on 04/23/24  POCT glycosylated hemoglobin (Hb A1C)   Collection Time: 04/23/24  8:56 AM  Result Value Ref Range   Hemoglobin A1C 6.7 (A) 4.0 -  5.6 %   HbA1c POC (<> result, manual entry)     HbA1c, POC (prediabetic range)     HbA1c, POC (controlled diabetic range)       COVID 19 screen:  No recent travel or known exposure to COVID19 The patient denies respiratory symptoms of COVID 19 at this time. The importance of social distancing was discussed  today.   Assessment and Plan Type 2 diabetes mellitus without complication, with long-term current use of insulin  (HCC) Assessment & Plan:  Chronic, continued improvement,  now at goal on current regimen.  On Basaglar  20 units daily.. has decreased to 18 Metformin  XL 500 mg daily Continued jardiance  10 mg daily  Reevaluate in 6 months.  Orders: -     POCT glycosylated hemoglobin (Hb A1C) -     Microalbumin / creatinine urine ratio  Hypertension associated with diabetes (HCC) Assessment & Plan: Stable, chronic.  Continue current medication.   Entresto  97/103 mg daily   aldactone  25 mg daily  Metoprolol  200 mg daily   Digoxin  0.125 mg  daily     Greig Ring, MD

## 2024-04-23 NOTE — Assessment & Plan Note (Signed)
Chronic, continued improvement,  now at goal on current regimen.  On Basaglar 20 units daily.. has decreased to 18 Metformin XL 500 mg daily Continued jardiance 10 mg daily  Reevaluate in 6 months.

## 2024-06-17 ENCOUNTER — Other Ambulatory Visit
Admission: RE | Admit: 2024-06-17 | Discharge: 2024-06-17 | Disposition: A | Discharge status: Home / Self Care | Attending: Internal Medicine | Admitting: Internal Medicine

## 2024-06-17 DIAGNOSIS — I5042 Chronic combined systolic (congestive) and diastolic (congestive) heart failure: Secondary | ICD-10-CM | POA: Diagnosis not present

## 2024-06-17 LAB — DIGOXIN LEVEL: Digoxin Level: <0.6 ng/mL — ABNORMAL LOW (ref 0.8–2.0)

## 2024-06-19 ENCOUNTER — Other Ambulatory Visit: Payer: Self-pay | Admitting: Family Medicine

## 2024-07-03 ENCOUNTER — Ambulatory Visit: Payer: Medicare HMO

## 2024-07-03 ENCOUNTER — Ambulatory Visit

## 2024-07-03 DIAGNOSIS — I428 Other cardiomyopathies: Secondary | ICD-10-CM | POA: Diagnosis not present

## 2024-07-03 LAB — CUP PACEART REMOTE DEVICE CHECK
Battery Remaining Longevity: 6 mo
Battery Voltage: 2.83 V
Brady Statistic AP VP Percent: 0.07 %
Brady Statistic AP VS Percent: 0 %
Brady Statistic AS VP Percent: 98.57 %
Brady Statistic AS VS Percent: 1.36 %
Brady Statistic RA Percent Paced: 0.07 %
Brady Statistic RV Percent Paced: 89.63 %
Date Time Interrogation Session: 20251010063324
HighPow Impedance: 53 Ohm
Implantable Lead Connection Status: 753985
Implantable Lead Connection Status: 753985
Implantable Lead Connection Status: 753985
Implantable Lead Implant Date: 20181003
Implantable Lead Implant Date: 20181003
Implantable Lead Implant Date: 20181003
Implantable Lead Location: 753858
Implantable Lead Location: 753859
Implantable Lead Location: 753860
Implantable Lead Model: 4398
Implantable Lead Model: 5076
Implantable Pulse Generator Implant Date: 20181003
Lead Channel Impedance Value: 165.029
Lead Channel Impedance Value: 172.541
Lead Channel Impedance Value: 189.525
Lead Channel Impedance Value: 193.455
Lead Channel Impedance Value: 215.064
Lead Channel Impedance Value: 247 Ohm
Lead Channel Impedance Value: 304 Ohm
Lead Channel Impedance Value: 304 Ohm
Lead Channel Impedance Value: 361 Ohm
Lead Channel Impedance Value: 399 Ohm
Lead Channel Impedance Value: 456 Ohm
Lead Channel Impedance Value: 532 Ohm
Lead Channel Impedance Value: 589 Ohm
Lead Channel Impedance Value: 608 Ohm
Lead Channel Impedance Value: 665 Ohm
Lead Channel Impedance Value: 703 Ohm
Lead Channel Impedance Value: 779 Ohm
Lead Channel Impedance Value: 779 Ohm
Lead Channel Pacing Threshold Amplitude: 0.625 V
Lead Channel Pacing Threshold Amplitude: 0.625 V
Lead Channel Pacing Threshold Amplitude: 1.25 V
Lead Channel Pacing Threshold Pulse Width: 0.4 ms
Lead Channel Pacing Threshold Pulse Width: 0.4 ms
Lead Channel Pacing Threshold Pulse Width: 0.4 ms
Lead Channel Sensing Intrinsic Amplitude: 1.875 mV
Lead Channel Sensing Intrinsic Amplitude: 1.875 mV
Lead Channel Sensing Intrinsic Amplitude: 8.75 mV
Lead Channel Sensing Intrinsic Amplitude: 8.75 mV
Lead Channel Setting Pacing Amplitude: 1.5 V
Lead Channel Setting Pacing Amplitude: 2 V
Lead Channel Setting Pacing Amplitude: 2 V
Lead Channel Setting Pacing Pulse Width: 0.4 ms
Lead Channel Setting Pacing Pulse Width: 0.4 ms
Lead Channel Setting Sensing Sensitivity: 0.3 mV
Zone Setting Status: 755011
Zone Setting Status: 755011

## 2024-07-03 NOTE — Progress Notes (Signed)
 Remote ICD Transmission

## 2024-07-05 ENCOUNTER — Ambulatory Visit: Payer: Self-pay | Admitting: Cardiology

## 2024-07-06 NOTE — Progress Notes (Signed)
 Remote ICD Transmission

## 2024-07-06 NOTE — Progress Notes (Unsigned)
 Remote ICD Transmission

## 2024-07-07 ENCOUNTER — Encounter: Payer: Self-pay | Admitting: Pharmacist

## 2024-07-07 NOTE — Progress Notes (Signed)
 Pharmacy Quality Measure Review  Statin Use in Persons with Diabetes (SUPD)  Myalgia documented in the past. Not addressed in 2025. No upcoming visits with PCP. Next is 2026. Future message sent to PCP.   ICD-10-CM code exceptions:  Rhabdomyolysis or myopathy G72.0            Drug-induced myopathy  G72.89          Other specified myopathies  G72.9            Myopathy, unspecified  M62.82         Rhabdomyolysis  T46.6X5A      Adverse effect of antihyperlipidemic and antiarteriosclerotic drugs, initial encounter   Future Appointments  Date Time Provider Department Center  10/27/2024 10:20 AM Avelina Greig BRAVO, MD [facility] [phone]

## 2024-08-03 ENCOUNTER — Ambulatory Visit: Attending: Cardiology

## 2024-08-04 LAB — CUP PACEART REMOTE DEVICE CHECK
Battery Remaining Longevity: 6 mo
Battery Voltage: 2.82 V
Brady Statistic AP VP Percent: 0.11 %
Brady Statistic AP VS Percent: 0 %
Brady Statistic AS VP Percent: 98.49 %
Brady Statistic AS VS Percent: 1.4 %
Brady Statistic RA Percent Paced: 0.11 %
Brady Statistic RV Percent Paced: 93.57 %
Date Time Interrogation Session: 20251110002205
HighPow Impedance: 62 Ohm
Implantable Lead Connection Status: 753985
Implantable Lead Connection Status: 753985
Implantable Lead Connection Status: 753985
Implantable Lead Implant Date: 20181003
Implantable Lead Implant Date: 20181003
Implantable Lead Implant Date: 20181003
Implantable Lead Location: 753858
Implantable Lead Location: 753859
Implantable Lead Location: 753860
Implantable Lead Model: 4398
Implantable Lead Model: 5076
Implantable Pulse Generator Implant Date: 20181003
Lead Channel Impedance Value: 188.1 Ohm
Lead Channel Impedance Value: 198.837
Lead Channel Impedance Value: 216.367
Lead Channel Impedance Value: 222.34 Ohm
Lead Channel Impedance Value: 244.491
Lead Channel Impedance Value: 247 Ohm
Lead Channel Impedance Value: 342 Ohm
Lead Channel Impedance Value: 342 Ohm
Lead Channel Impedance Value: 418 Ohm
Lead Channel Impedance Value: 418 Ohm
Lead Channel Impedance Value: 475 Ohm
Lead Channel Impedance Value: 589 Ohm
Lead Channel Impedance Value: 646 Ohm
Lead Channel Impedance Value: 665 Ohm
Lead Channel Impedance Value: 760 Ohm
Lead Channel Impedance Value: 760 Ohm
Lead Channel Impedance Value: 836 Ohm
Lead Channel Impedance Value: 874 Ohm
Lead Channel Pacing Threshold Amplitude: 0.625 V
Lead Channel Pacing Threshold Amplitude: 0.625 V
Lead Channel Pacing Threshold Amplitude: 1.25 V
Lead Channel Pacing Threshold Pulse Width: 0.4 ms
Lead Channel Pacing Threshold Pulse Width: 0.4 ms
Lead Channel Pacing Threshold Pulse Width: 0.4 ms
Lead Channel Sensing Intrinsic Amplitude: 2 mV
Lead Channel Sensing Intrinsic Amplitude: 2 mV
Lead Channel Sensing Intrinsic Amplitude: 9.625 mV
Lead Channel Sensing Intrinsic Amplitude: 9.625 mV
Lead Channel Setting Pacing Amplitude: 1.5 V
Lead Channel Setting Pacing Amplitude: 2 V
Lead Channel Setting Pacing Amplitude: 2 V
Lead Channel Setting Pacing Pulse Width: 0.4 ms
Lead Channel Setting Pacing Pulse Width: 0.4 ms
Lead Channel Setting Sensing Sensitivity: 0.3 mV
Zone Setting Status: 755011
Zone Setting Status: 755011

## 2024-08-05 ENCOUNTER — Other Ambulatory Visit: Payer: Self-pay | Admitting: Family Medicine

## 2024-09-03 ENCOUNTER — Ambulatory Visit: Attending: Family Medicine

## 2024-09-21 ENCOUNTER — Other Ambulatory Visit: Payer: Self-pay | Admitting: Family Medicine

## 2024-09-22 ENCOUNTER — Telehealth: Payer: Self-pay | Admitting: *Deleted

## 2024-09-22 NOTE — Telephone Encounter (Signed)
 Patient is not a candidate for statin medication.  He has had statin associated myalgia.

## 2024-09-22 NOTE — Telephone Encounter (Signed)
 Copied from CRM #[id number]. Topic: General - Other >> Sep 22, 2024 12:42 PM Rea C wrote: Reason for CRM: Sherrilyn from [facility] is calling to provide fyi call if member is a candidate for statin medications due to the medications he is on now. They do not need a call back. Just to see if patient meets criteria.   [phone] (callback if needed)

## 2024-10-04 ENCOUNTER — Ambulatory Visit

## 2024-10-04 DIAGNOSIS — I428 Other cardiomyopathies: Secondary | ICD-10-CM

## 2024-10-06 ENCOUNTER — Ambulatory Visit: Payer: Self-pay | Admitting: Cardiology

## 2024-10-06 LAB — CUP PACEART REMOTE DEVICE CHECK
Battery Remaining Longevity: 3 mo
Battery Voltage: 2.79 V
Brady Statistic AP VP Percent: 0.26 %
Brady Statistic AP VS Percent: 0 %
Brady Statistic AS VP Percent: 98.31 %
Brady Statistic AS VS Percent: 1.42 %
Brady Statistic RA Percent Paced: 0.27 %
Brady Statistic RV Percent Paced: 94.57 %
Date Time Interrogation Session: 20260111054347
HighPow Impedance: 58 Ohm
Implantable Lead Connection Status: 753985
Implantable Lead Connection Status: 753985
Implantable Lead Connection Status: 753985
Implantable Lead Implant Date: 20181003
Implantable Lead Implant Date: 20181003
Implantable Lead Implant Date: 20181003
Implantable Lead Location: 753858
Implantable Lead Location: 753859
Implantable Lead Location: 753860
Implantable Lead Model: 4398
Implantable Lead Model: 5076
Implantable Pulse Generator Implant Date: 20181003
Lead Channel Impedance Value: 172.541
Lead Channel Impedance Value: 190.884
Lead Channel Impedance Value: 195.911
Lead Channel Impedance Value: 224.438
Lead Channel Impedance Value: 231.42 Ohm
Lead Channel Impedance Value: 247 Ohm
Lead Channel Impedance Value: 304 Ohm
Lead Channel Impedance Value: 342 Ohm
Lead Channel Impedance Value: 399 Ohm
Lead Channel Impedance Value: 418 Ohm
Lead Channel Impedance Value: 513 Ohm
Lead Channel Impedance Value: 551 Ohm
Lead Channel Impedance Value: 646 Ohm
Lead Channel Impedance Value: 703 Ohm
Lead Channel Impedance Value: 760 Ohm
Lead Channel Impedance Value: 779 Ohm
Lead Channel Impedance Value: 779 Ohm
Lead Channel Impedance Value: 836 Ohm
Lead Channel Pacing Threshold Amplitude: 0.625 V
Lead Channel Pacing Threshold Amplitude: 0.625 V
Lead Channel Pacing Threshold Amplitude: 1.25 V
Lead Channel Pacing Threshold Pulse Width: 0.4 ms
Lead Channel Pacing Threshold Pulse Width: 0.4 ms
Lead Channel Pacing Threshold Pulse Width: 0.4 ms
Lead Channel Sensing Intrinsic Amplitude: 1.875 mV
Lead Channel Sensing Intrinsic Amplitude: 1.875 mV
Lead Channel Sensing Intrinsic Amplitude: 9.625 mV
Lead Channel Sensing Intrinsic Amplitude: 9.625 mV
Lead Channel Setting Pacing Amplitude: 1.5 V
Lead Channel Setting Pacing Amplitude: 2 V
Lead Channel Setting Pacing Amplitude: 2 V
Lead Channel Setting Pacing Pulse Width: 0.4 ms
Lead Channel Setting Pacing Pulse Width: 0.4 ms
Lead Channel Setting Sensing Sensitivity: 0.3 mV
Zone Setting Status: 755011
Zone Setting Status: 755011

## 2024-10-06 NOTE — Progress Notes (Signed)
 Remote ICD Transmission

## 2024-10-07 ENCOUNTER — Telehealth: Payer: Self-pay | Admitting: *Deleted

## 2024-10-07 DIAGNOSIS — E119 Type 2 diabetes mellitus without complications: Secondary | ICD-10-CM

## 2024-10-07 DIAGNOSIS — E1169 Type 2 diabetes mellitus with other specified complication: Secondary | ICD-10-CM

## 2024-10-07 DIAGNOSIS — Z125 Encounter for screening for malignant neoplasm of prostate: Secondary | ICD-10-CM

## 2024-10-07 NOTE — Telephone Encounter (Signed)
-----   Message from [name] sent at 10/06/2024  3:36 PM EST ----- Regarding: Lab orders for Tue, 1.27.26 Patient is scheduled for CPX labs, please order future labs, Thanks , Veva

## 2024-10-09 NOTE — Progress Notes (Signed)
 NICKLAUS ALVIAR                                          MRN: [id number]   10/09/2024   The [facility] Quality Team Specialist reviewed this patient medical record for the purposes of chart review for care gap closure. The following were reviewed: abstraction for care gap closure-glycemic status assessment.    [facility] Quality Team

## 2024-10-20 ENCOUNTER — Other Ambulatory Visit

## 2024-10-21 ENCOUNTER — Encounter: Payer: Self-pay | Admitting: Oncology

## 2024-10-22 ENCOUNTER — Ambulatory Visit: Payer: Self-pay | Admitting: Family Medicine

## 2024-10-22 ENCOUNTER — Other Ambulatory Visit (INDEPENDENT_AMBULATORY_CARE_PROVIDER_SITE_OTHER)

## 2024-10-22 DIAGNOSIS — E785 Hyperlipidemia, unspecified: Secondary | ICD-10-CM | POA: Diagnosis not present

## 2024-10-22 DIAGNOSIS — Z125 Encounter for screening for malignant neoplasm of prostate: Secondary | ICD-10-CM

## 2024-10-22 DIAGNOSIS — E1169 Type 2 diabetes mellitus with other specified complication: Secondary | ICD-10-CM | POA: Diagnosis not present

## 2024-10-22 DIAGNOSIS — Z794 Long term (current) use of insulin: Secondary | ICD-10-CM

## 2024-10-22 DIAGNOSIS — E119 Type 2 diabetes mellitus without complications: Secondary | ICD-10-CM | POA: Diagnosis not present

## 2024-10-22 LAB — COMPREHENSIVE METABOLIC PANEL WITH GFR
ALT: 15 U/L (ref 3–53)
AST: 18 U/L (ref 5–37)
Albumin: 3.9 g/dL (ref 3.5–5.2)
Alkaline Phosphatase: 65 U/L (ref 39–117)
BUN: 14 mg/dL (ref 6–23)
CO2: 32 meq/L (ref 19–32)
Calcium: 8.9 mg/dL (ref 8.4–10.5)
Chloride: 105 meq/L (ref 96–112)
Creatinine, Ser: 0.99 mg/dL (ref 0.40–1.50)
GFR: 85.81 mL/min
Glucose, Bld: 85 mg/dL (ref 70–99)
Potassium: 4 meq/L (ref 3.5–5.1)
Sodium: 140 meq/L (ref 135–145)
Total Bilirubin: 0.8 mg/dL (ref 0.2–1.2)
Total Protein: 7.4 g/dL (ref 6.0–8.3)

## 2024-10-22 LAB — LIPID PANEL
Cholesterol: 123 mg/dL (ref 28–200)
HDL: 57.5 mg/dL
LDL Cholesterol: 54 mg/dL (ref 10–99)
NonHDL: 65.86
Total CHOL/HDL Ratio: 2
Triglycerides: 61 mg/dL (ref 10.0–149.0)
VLDL: 12.2 mg/dL (ref 0.0–40.0)

## 2024-10-22 LAB — HEMOGLOBIN A1C: Hgb A1c MFr Bld: 6.7 % — ABNORMAL HIGH (ref 4.6–6.5)

## 2024-10-22 LAB — PSA, MEDICARE: PSA: 0.21 ng/mL (ref 0.10–4.00)

## 2024-10-22 NOTE — Progress Notes (Signed)
 No critical labs need to be addressed urgently. We will discuss labs in detail at upcoming office visit.

## 2024-10-27 ENCOUNTER — Encounter: Admitting: Family Medicine

## 2024-10-28 ENCOUNTER — Encounter: Payer: Self-pay | Admitting: Family Medicine

## 2024-10-28 ENCOUNTER — Ambulatory Visit: Admitting: Family Medicine

## 2024-10-28 VITALS — BP 118/80 | HR 63 | Temp 98.1°F | Ht 75.0 in | Wt 203.0 lb

## 2024-10-28 DIAGNOSIS — E119 Type 2 diabetes mellitus without complications: Secondary | ICD-10-CM

## 2024-10-28 DIAGNOSIS — I5042 Chronic combined systolic (congestive) and diastolic (congestive) heart failure: Secondary | ICD-10-CM

## 2024-10-28 DIAGNOSIS — E1159 Type 2 diabetes mellitus with other circulatory complications: Secondary | ICD-10-CM

## 2024-10-28 DIAGNOSIS — G720 Drug-induced myopathy: Secondary | ICD-10-CM

## 2024-10-28 DIAGNOSIS — I428 Other cardiomyopathies: Secondary | ICD-10-CM

## 2024-10-28 DIAGNOSIS — E1169 Type 2 diabetes mellitus with other specified complication: Secondary | ICD-10-CM

## 2024-10-28 DIAGNOSIS — Z23 Encounter for immunization: Secondary | ICD-10-CM

## 2024-10-28 DIAGNOSIS — Z0000 Encounter for general adult medical examination without abnormal findings: Secondary | ICD-10-CM

## 2024-10-28 LAB — MICROALBUMIN / CREATININE URINE RATIO
Creatinine,U: 77.4 mg/dL
Microalb Creat Ratio: UNDETERMINED mg/g (ref 0.0–30.0)
Microalb, Ur: <0.7 mg/dL

## 2024-10-28 LAB — HM DIABETES FOOT EXAM

## 2024-10-28 NOTE — Progress Notes (Signed)
 "   Patient ID: Philip Ponce, male    DOB: 1971-09-22, 54 y.o.   MRN: [id number]  This visit was conducted in person.  BP 118/80   Pulse 63   Temp 98.1 F (36.7 C) (Oral)   Ht 6' 3 (1.905 m)   Wt 203 lb (92.1 kg)   SpO2 99%   BMI 25.37 kg/m    CC:  Chief Complaint  Patient presents with   Annual Exam    Pt is trying to apply for FL2.    Subjective:   HPI: Philip Ponce is a 54 y.o. male presenting on 10/28/2024 for Annual Exam (Pt is trying to apply for FL2.)  The patient presents for annual medicare wellness, complete physical and review of chronic health problems. He/She also has the following acute concerns today: none  I have personally reviewed the Medicare Annual Wellness questionnaire and have noted 1. The patient's medical and social history 2. Their use of alcohol, tobacco or illicit drugs 3. Their current medications and supplements 4. The patient's functional ability including ADL's, fall risks, home safety risks and hearing or visual             impairment. 5. Diet and physical activities 6. Evidence for depression or mood disorders 7.         Updated provider list Cognitive evaluation was performed and recorded on pt medicare questionnaire form. The patients weight, height, BMI and visual acuity have been recorded in the chart   I have made referrals, counseling and provided education to the patient based review of the above and I have provided the pt with a written personalized care plan for preventive services.   Documentation of this information was scanned into the electronic record under the media tab.   Advance directives and end of life planning reviewed in detail with patient and documented in EMR. Patient given handout on advance care directives if needed. HCPOA and living will updated if needed.  No falls in last 12 months.   Chronic combined systolic and diastolic heart failure: Euvolemic in office today  Followed by  cardiology  Hypertension:  Well-controlled on Toprol -XL 100 mg p.o. daily, Entresto  97/103 mg daily and spironolactone  25 mg daily  BP Readings from Last 3 Encounters:  10/28/24 118/80  04/23/24 100/64  12/24/23 (!) 132/90  Using medication without problems or lightheadedness:  none Chest pain with exertion: Edema: none Short of breath: stable Average home BPs: Other issues:  Elevated Cholesterol: LDL at goal on no medication.. myalgia associated with statin Lab Results  Component Value Date   CHOL 123 10/22/2024   HDL 57.50 10/22/2024   LDLCALC 54 10/22/2024   TRIG 61.0 10/22/2024   CHOLHDL 2 10/22/2024  Using medications without problems: Muscle aches:  Diet compliance: healthy eating Exercise: 3 times a week Other complaints: normal microalbiumin  Diabetes: Improved control  on  metformin  XR 500 mg daily and Basaglar   18 units daily, jardiance  10 mg daily  Lab Results  Component Value Date   HGBA1C 6.7 (H) 10/22/2024  Using medications without difficulties: Hypoglycemic episodes: none Hyperglycemic episodes: some at night.. feels jittery Feet problems: no ulcer Blood Sugars averaging:   cannot afford CGM.. < 100 eye exam within last year: yes  Wt Readings from Last 3 Encounters:  10/28/24 203 lb (92.1 kg)  04/23/24 207 lb (93.9 kg)  12/24/23 212 lb 3.2 oz (96.3 kg)     Patient Care Team: Avelina Greig BRAVO, MD as PCP -  General Inocencio Soyla Lunger, MD as PCP - Electrophysiology (Cardiology) End, Lonni, MD as PCP - Cardiology (Cardiology) Donette Ellouise LABOR, FNP as Nurse Practitioner (Family Medicine) Fate Morna SAILOR, [facility] (Inactive) as Pharmacist (Pharmacist)    Relevant past medical, surgical, family and social history reviewed and updated as indicated. Interim medical history since our last visit reviewed. Allergies and medications reviewed and updated. Outpatient Medications Prior to Visit  Medication Sig Dispense Refill   B-D ULTRAFINE III SHORT  PEN 31G X 8 MM MISC USE TO INJECT INSULIN  DAILY 90 each 3   Blood Glucose Monitoring Suppl (ONE TOUCH ULTRA 2) w/Device KIT Use to check blood sugar up to 2 times a day 1 kit 0   calcium  carbonate (OS-CAL) 600 MG TABS tablet Take 1,200 mg by mouth daily with breakfast.      digoxin  (LANOXIN ) 0.125 MG tablet Take 1 tablet (125 mcg total) by mouth daily. 30 tablet 3   empagliflozin  (JARDIANCE ) 10 MG TABS tablet TAKE 1 TABLET DAILY BEFORE BREAKFAST 90 tablet 1   glucose blood (ONE TOUCH ULTRA TEST) test strip Check blood sugar up to 2 times a day and as instructed. Dx E11.9 100 each 5   Insulin  Glargine (BASAGLAR  KWIKPEN) 100 UNIT/ML Inject 18 Units into the skin daily. 15 mL 3   Lancets (ONETOUCH ULTRASOFT) lancets Use to check blood sugar up to 2 times a day. 100 each 5   metFORMIN  (GLUCOPHAGE -XR) 500 MG 24 hr tablet TAKE 1 TABLET DAILY WITH   BREAKFAST 90 tablet 3   metoprolol  (TOPROL -XL) 200 MG 24 hr tablet Take 200 mg by mouth daily.     Multiple Vitamins-Minerals (ADULT GUMMY PO) Take 2 tablets by mouth daily.     omeprazole  (PRILOSEC) 40 MG capsule TAKE 1 CAPSULE DAILY AS    NEEDED FOR INDIGESTION 90 capsule 0   OVER THE COUNTER MEDICATION SEA MOSS SUPPLEMENT     sacubitril -valsartan  (ENTRESTO ) 97-103 MG Take 1 tablet by mouth 2 (two) times daily. 180 tablet 3   spironolactone  (ALDACTONE ) 25 MG tablet Take 1 tablet (25 mg total) by mouth daily. 90 tablet 3   No facility-administered medications prior to visit.     Per HPI unless specifically indicated in ROS section below Review of Systems  Constitutional:  Negative for fatigue and fever.  HENT:  Negative for ear pain.   Eyes:  Negative for pain.  Respiratory:  Negative for cough and shortness of breath.   Cardiovascular:  Negative for chest pain, palpitations and leg swelling.  Gastrointestinal:  Negative for abdominal pain.  Genitourinary:  Negative for dysuria.  Musculoskeletal:  Negative for arthralgias.  Neurological:  Negative  for syncope, light-headedness and headaches.  Psychiatric/Behavioral:  Negative for dysphoric mood.    Objective:  BP 118/80   Pulse 63   Temp 98.1 F (36.7 C) (Oral)   Ht 6' 3 (1.905 m)   Wt 203 lb (92.1 kg)   SpO2 99%   BMI 25.37 kg/m   Wt Readings from Last 3 Encounters:  10/28/24 203 lb (92.1 kg)  04/23/24 207 lb (93.9 kg)  12/24/23 212 lb 3.2 oz (96.3 kg)      Physical Exam Constitutional:      Appearance: He is well-developed.  HENT:     Head: Normocephalic.     Right Ear: Hearing normal.     Left Ear: Hearing normal.     Nose: Nose normal.  Neck:     Thyroid : No thyroid  mass or thyromegaly.  Vascular: No carotid bruit.     Trachea: Trachea normal.  Cardiovascular:     Rate and Rhythm: Normal rate and regular rhythm.     Pulses: Normal pulses.     Heart sounds: Heart sounds not distant. No murmur heard.    No friction rub. No gallop.     Comments: No peripheral edema Pulmonary:     Effort: Pulmonary effort is normal. No respiratory distress.     Breath sounds: Normal breath sounds.  Skin:    General: Skin is warm and dry.     Findings: No rash.  Psychiatric:        Speech: Speech normal.        Behavior: Behavior normal.        Thought Content: Thought content normal.   Diabetic foot exam: Normal inspection No skin breakdown No calluses  Normal DP pulses Normal sensation to light touch and monofilament Nails normal     Results for orders placed or performed in visit on 10/28/24  HM DIABETES FOOT EXAM   Collection Time: 10/28/24 12:00 AM  Result Value Ref Range   HM Diabetic Foot Exam done      COVID 19 screen:  No recent travel or known exposure to COVID19 The patient denies respiratory symptoms of COVID 19 at this time. The importance of social distancing was discussed today.   Assessment and Plan   The patient's preventative maintenance and recommended screening tests for an annual wellness exam were reviewed in full today. Brought  up to date unless services declined.  Counselled on the importance of diet, exercise, and its role in overall health and mortality. The patient's FH and SH was reviewed, including their home life, tobacco status, and drug and alcohol status.   Vaccines: 4 x COVID vaccine, tdap due but not covered PNA uptodate, given flu Prostate Cancer Screen:  Lab Results  Component Value Date   PSA 0.21 10/22/2024   PSA 0.26 09/05/2023   PSA 0.16 08/08/2021  Colon Cancer Screen:  2018, and 09/2022, repeat in 10 years      Smoking Status:nonsmoker ETOH/ drug use: none/none  Hep C:  done  HIV screen:   refused   Medicare annual wellness visit, subsequent  Type 2 diabetes mellitus without complication, with long-term current use of insulin  (HCC) Assessment & Plan:  Chronic, continued improvement,  now at goal on current regimen.  On Basaglar  20 units daily.. has decreased to 18 Metformin  XL 500 mg daily Continued jardiance  10 mg daily   Due for foot exam and  uACR.  Due for yearly ophthalmologist eval.  Reevaluate in 6 months.  Orders: -     Microalbumin / creatinine urine ratio; Future  Encounter for immunization -     Flu vaccine trivalent PF, 6mos and older(Flulaval,Afluria,Fluarix,Fluzone)  Hyperlipidemia associated with type 2 diabetes mellitus (HCC) Assessment & Plan: LDL at goal  in past  on no medication.. myalgia associated with statin     Myalgia due to statin Assessment & Plan:  Intolerant of statins.   Hypertension associated with diabetes (HCC) Assessment & Plan: Stable, chronic.  Continue current medication.  Followed by cardiology.   Entresto  97/103 mg daily   aldactone  25 mg daily  Metoprolol  200 mg daily   Digoxin  0.125 mg  daily   Non-ischemic cardiomyopathy (HCC) Assessment & Plan: Followed by cardiology.   Chronic combined systolic and diastolic heart failure (HCC) Assessment & Plan: Chronic, followed by cardiology. Euvolemic in office  today  On  Entresto  and Jardiance      Greig Ring, MD   "

## 2024-10-28 NOTE — Assessment & Plan Note (Signed)
 Followed by cardiology

## 2024-10-28 NOTE — Assessment & Plan Note (Signed)
Intolerant of statins

## 2024-10-28 NOTE — Patient Instructions (Signed)
Set up yearly eye exam for diabetes and have the opthalmologist send [place] a copy of the evaluation for the chart.

## 2024-10-28 NOTE — Assessment & Plan Note (Signed)
LDL at goal  in past  on no medication.. myalgia associated with statin

## 2024-10-28 NOTE — Assessment & Plan Note (Addendum)
"   Chronic, continued improvement,  now at goal on current regimen.  On Basaglar  20 units daily.. has decreased to 18 Metformin  XL 500 mg daily Continued jardiance  10 mg daily   Due for foot exam and  uACR.  Due for yearly ophthalmologist eval.  Reevaluate in 6 months. "

## 2024-10-28 NOTE — Assessment & Plan Note (Signed)
 Chronic, followed by cardiology. Euvolemic in office today  On Entresto and Jardiance

## 2024-10-28 NOTE — Assessment & Plan Note (Signed)
 Stable, chronic.  Continue current medication.  Followed by cardiology.   Entresto  97/103 mg daily   aldactone  25 mg daily  Metoprolol  200 mg daily   Digoxin  0.125 mg  daily

## 2024-10-29 ENCOUNTER — Ambulatory Visit: Payer: Self-pay | Admitting: Family Medicine

## 2024-11-04 ENCOUNTER — Ambulatory Visit

## 2024-12-05 ENCOUNTER — Ambulatory Visit

## 2025-01-05 ENCOUNTER — Ambulatory Visit
